# Patient Record
Sex: Female | Born: 1935 | Race: Black or African American | Hispanic: No | Smoking: Former smoker
Health system: Southern US, Community
[De-identification: ages and names within clinical notes are randomized; demographics above are authoritative.]

## PROBLEM LIST (undated history)

## (undated) DIAGNOSIS — E2839 Other primary ovarian failure: Secondary | ICD-10-CM

## (undated) DIAGNOSIS — E785 Hyperlipidemia, unspecified: Secondary | ICD-10-CM

## (undated) DIAGNOSIS — B029 Zoster without complications: Secondary | ICD-10-CM

## (undated) DIAGNOSIS — I679 Cerebrovascular disease, unspecified: Secondary | ICD-10-CM

## (undated) DIAGNOSIS — K635 Polyp of colon: Secondary | ICD-10-CM

## (undated) DIAGNOSIS — M170 Bilateral primary osteoarthritis of knee: Secondary | ICD-10-CM

## (undated) DIAGNOSIS — M8580 Other specified disorders of bone density and structure, unspecified site: Secondary | ICD-10-CM

## (undated) DIAGNOSIS — M1990 Unspecified osteoarthritis, unspecified site: Secondary | ICD-10-CM

## (undated) DIAGNOSIS — M545 Low back pain, unspecified: Secondary | ICD-10-CM

## (undated) DIAGNOSIS — N3281 Overactive bladder: Secondary | ICD-10-CM

## (undated) DIAGNOSIS — E041 Nontoxic single thyroid nodule: Secondary | ICD-10-CM

## (undated) DIAGNOSIS — E7800 Pure hypercholesterolemia, unspecified: Secondary | ICD-10-CM

## (undated) DIAGNOSIS — G4710 Hypersomnia, unspecified: Secondary | ICD-10-CM

## (undated) DIAGNOSIS — R32 Unspecified urinary incontinence: Secondary | ICD-10-CM

## (undated) DIAGNOSIS — Z6841 Body Mass Index (BMI) 40.0 and over, adult: Secondary | ICD-10-CM

## (undated) DIAGNOSIS — I10 Essential (primary) hypertension: Secondary | ICD-10-CM

## (undated) DIAGNOSIS — I499 Cardiac arrhythmia, unspecified: Secondary | ICD-10-CM

## (undated) DIAGNOSIS — G5623 Lesion of ulnar nerve, bilateral upper limbs: Secondary | ICD-10-CM

## (undated) DIAGNOSIS — M899 Disorder of bone, unspecified: Secondary | ICD-10-CM

## (undated) DIAGNOSIS — M5440 Lumbago with sciatica, unspecified side: Secondary | ICD-10-CM

## (undated) DIAGNOSIS — J449 Chronic obstructive pulmonary disease, unspecified: Secondary | ICD-10-CM

## (undated) DIAGNOSIS — I5032 Chronic diastolic (congestive) heart failure: Secondary | ICD-10-CM

## (undated) DIAGNOSIS — I639 Cerebral infarction, unspecified: Secondary | ICD-10-CM

## (undated) DIAGNOSIS — J309 Allergic rhinitis, unspecified: Secondary | ICD-10-CM

## (undated) DIAGNOSIS — R42 Dizziness and giddiness: Secondary | ICD-10-CM

## (undated) HISTORY — DX: Lesion of ulnar nerve, bilateral upper limbs: G56.23

## (undated) HISTORY — DX: Chronic obstructive pulmonary disease, unspecified: J44.9

## (undated) HISTORY — DX: Hypersomnia, unspecified: G47.10

## (undated) HISTORY — DX: Unspecified urinary incontinence: R32

## (undated) HISTORY — DX: Other specified disorders of bone density and structure, unspecified site: M85.80

## (undated) HISTORY — DX: Zoster without complications: B02.9

## (undated) HISTORY — DX: Cerebrovascular disease, unspecified: I67.9

## (undated) HISTORY — DX: Unspecified osteoarthritis, unspecified site: M19.90

## (undated) HISTORY — DX: Morbid (severe) obesity due to excess calories: E66.01

## (undated) HISTORY — DX: Dizziness and giddiness: R42

## (undated) HISTORY — DX: Chronic diastolic (congestive) heart failure: I50.32

## (undated) HISTORY — DX: Cardiac arrhythmia, unspecified: I49.9

## (undated) HISTORY — DX: Cerebral infarction, unspecified: I63.9

## (undated) HISTORY — DX: Body Mass Index (BMI) 40.0 and over, adult: Z684

## (undated) HISTORY — DX: Hyperlipidemia, unspecified: E78.5

## (undated) HISTORY — DX: Allergic rhinitis, unspecified: J30.9

## (undated) HISTORY — DX: Disorder of bone, unspecified: M89.9

## (undated) HISTORY — PX: BREAST EXCISIONAL BIOPSY: SUR124

## (undated) HISTORY — DX: Nontoxic single thyroid nodule: E04.1

## (undated) HISTORY — DX: Other primary ovarian failure: E28.39

## (undated) HISTORY — DX: Low back pain, unspecified: M54.50

## (undated) HISTORY — DX: Polyp of colon: K63.5

## (undated) HISTORY — DX: Overactive bladder: N32.81

## (undated) HISTORY — DX: Pure hypercholesterolemia, unspecified: E78.00

## (undated) HISTORY — DX: Lumbago with sciatica, unspecified side: M54.40

## (undated) HISTORY — DX: Essential (primary) hypertension: I10

## (undated) HISTORY — DX: Bilateral primary osteoarthritis of knee: M17.0

---

## 1999-07-31 ENCOUNTER — Ambulatory Visit (HOSPITAL_COMMUNITY): Admission: RE | Admit: 1999-07-31 | Discharge: 1999-07-31 | Payer: Self-pay | Admitting: Gastroenterology

## 1999-07-31 ENCOUNTER — Encounter (INDEPENDENT_AMBULATORY_CARE_PROVIDER_SITE_OTHER): Payer: Self-pay | Admitting: *Deleted

## 2000-01-03 ENCOUNTER — Encounter: Payer: Self-pay | Admitting: *Deleted

## 2000-01-03 ENCOUNTER — Encounter: Admission: RE | Admit: 2000-01-03 | Discharge: 2000-01-03 | Payer: Self-pay | Admitting: *Deleted

## 2001-01-13 ENCOUNTER — Encounter: Payer: Self-pay | Admitting: Family Medicine

## 2001-01-13 ENCOUNTER — Encounter: Admission: RE | Admit: 2001-01-13 | Discharge: 2001-01-13 | Payer: Self-pay | Admitting: Family Medicine

## 2001-04-29 ENCOUNTER — Ambulatory Visit (HOSPITAL_COMMUNITY): Admission: RE | Admit: 2001-04-29 | Discharge: 2001-04-29 | Payer: Self-pay | Admitting: *Deleted

## 2001-04-29 ENCOUNTER — Encounter: Payer: Self-pay | Admitting: *Deleted

## 2001-05-07 ENCOUNTER — Encounter: Payer: Self-pay | Admitting: *Deleted

## 2001-05-07 ENCOUNTER — Encounter (INDEPENDENT_AMBULATORY_CARE_PROVIDER_SITE_OTHER): Payer: Self-pay | Admitting: Specialist

## 2001-05-07 ENCOUNTER — Ambulatory Visit (HOSPITAL_COMMUNITY): Admission: RE | Admit: 2001-05-07 | Discharge: 2001-05-07 | Payer: Self-pay | Admitting: *Deleted

## 2001-09-22 ENCOUNTER — Ambulatory Visit: Admission: RE | Admit: 2001-09-22 | Discharge: 2001-09-22 | Payer: Self-pay | Admitting: *Deleted

## 2001-10-14 ENCOUNTER — Ambulatory Visit (HOSPITAL_COMMUNITY): Admission: RE | Admit: 2001-10-14 | Discharge: 2001-10-14 | Payer: Self-pay | Admitting: *Deleted

## 2001-10-14 ENCOUNTER — Encounter: Payer: Self-pay | Admitting: *Deleted

## 2001-12-10 ENCOUNTER — Encounter: Payer: Self-pay | Admitting: *Deleted

## 2001-12-10 ENCOUNTER — Encounter: Admission: RE | Admit: 2001-12-10 | Discharge: 2001-12-10 | Payer: Self-pay | Admitting: *Deleted

## 2002-04-15 ENCOUNTER — Encounter: Payer: Self-pay | Admitting: *Deleted

## 2002-04-15 ENCOUNTER — Encounter: Admission: RE | Admit: 2002-04-15 | Discharge: 2002-04-15 | Payer: Self-pay | Admitting: *Deleted

## 2002-05-29 ENCOUNTER — Encounter: Payer: Self-pay | Admitting: *Deleted

## 2002-05-29 ENCOUNTER — Ambulatory Visit (HOSPITAL_COMMUNITY): Admission: RE | Admit: 2002-05-29 | Discharge: 2002-05-29 | Payer: Self-pay | Admitting: *Deleted

## 2003-04-16 ENCOUNTER — Other Ambulatory Visit: Admission: RE | Admit: 2003-04-16 | Discharge: 2003-04-16 | Payer: Self-pay | Admitting: *Deleted

## 2003-04-21 ENCOUNTER — Encounter: Payer: Self-pay | Admitting: *Deleted

## 2003-04-21 ENCOUNTER — Encounter: Admission: RE | Admit: 2003-04-21 | Discharge: 2003-04-21 | Payer: Self-pay | Admitting: *Deleted

## 2003-10-13 ENCOUNTER — Ambulatory Visit (HOSPITAL_COMMUNITY): Admission: RE | Admit: 2003-10-13 | Discharge: 2003-10-13 | Payer: Self-pay | Admitting: Family Medicine

## 2003-10-13 IMAGING — CR DG HIP (WITH OR WITHOUT PELVIS) 2-3V*L*
3 series · 3 of 3 positions shown · non-contrast
Comparison: None.

CLINICAL DATA: Surgery sixty years ago.  Fell several months.  Now with left hip pain when bending over.  

 LEFT HIP COMPLETE, [DATE]

[view not recorded (1 of 3)]
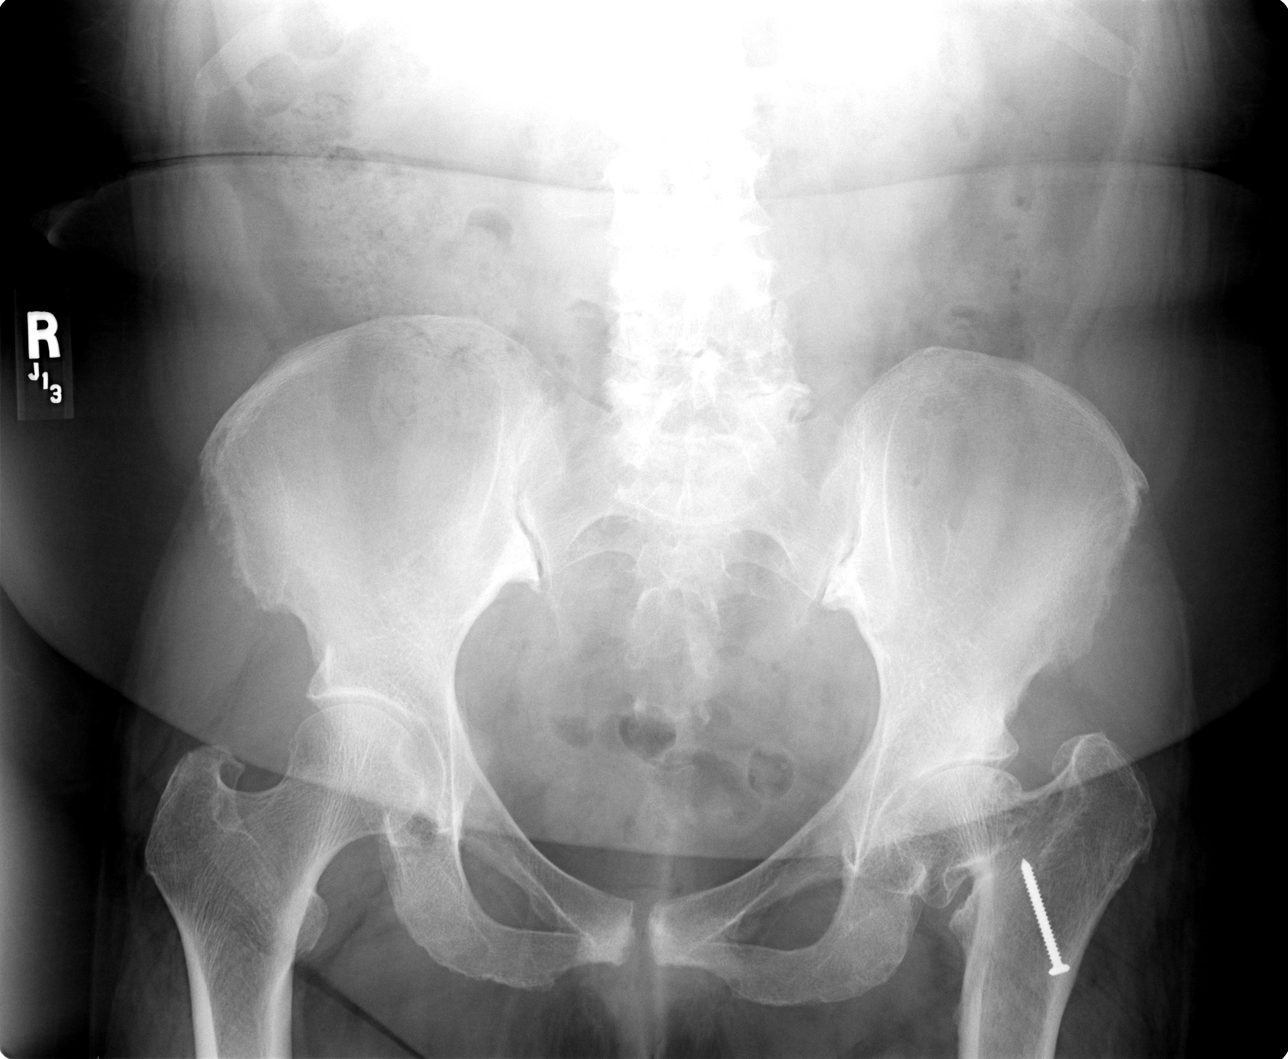

[view not recorded (2 of 3)]
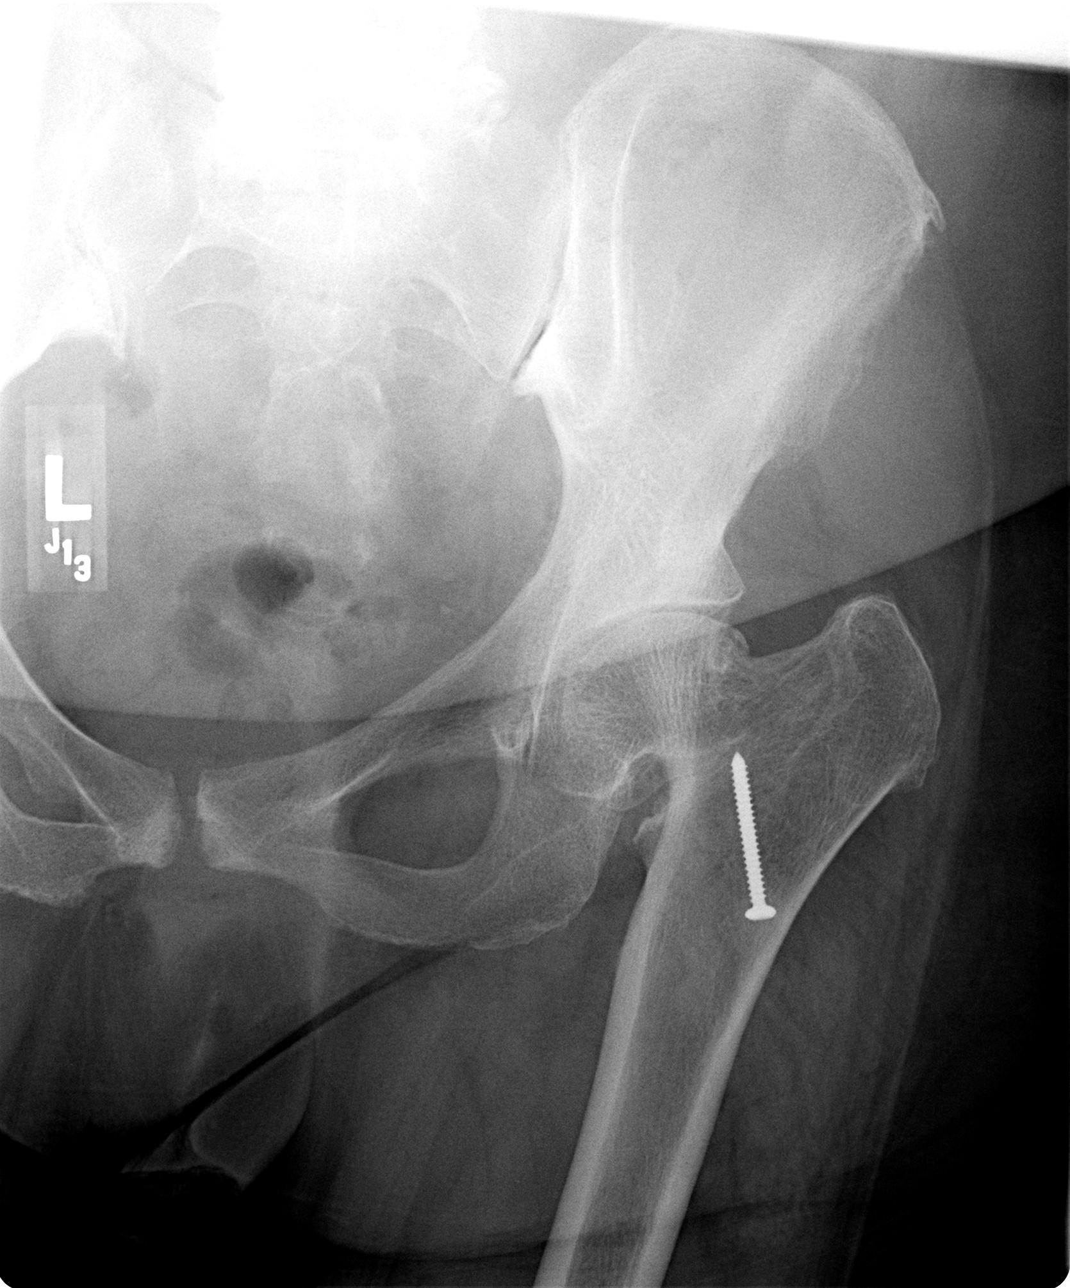

[view not recorded (3 of 3)]
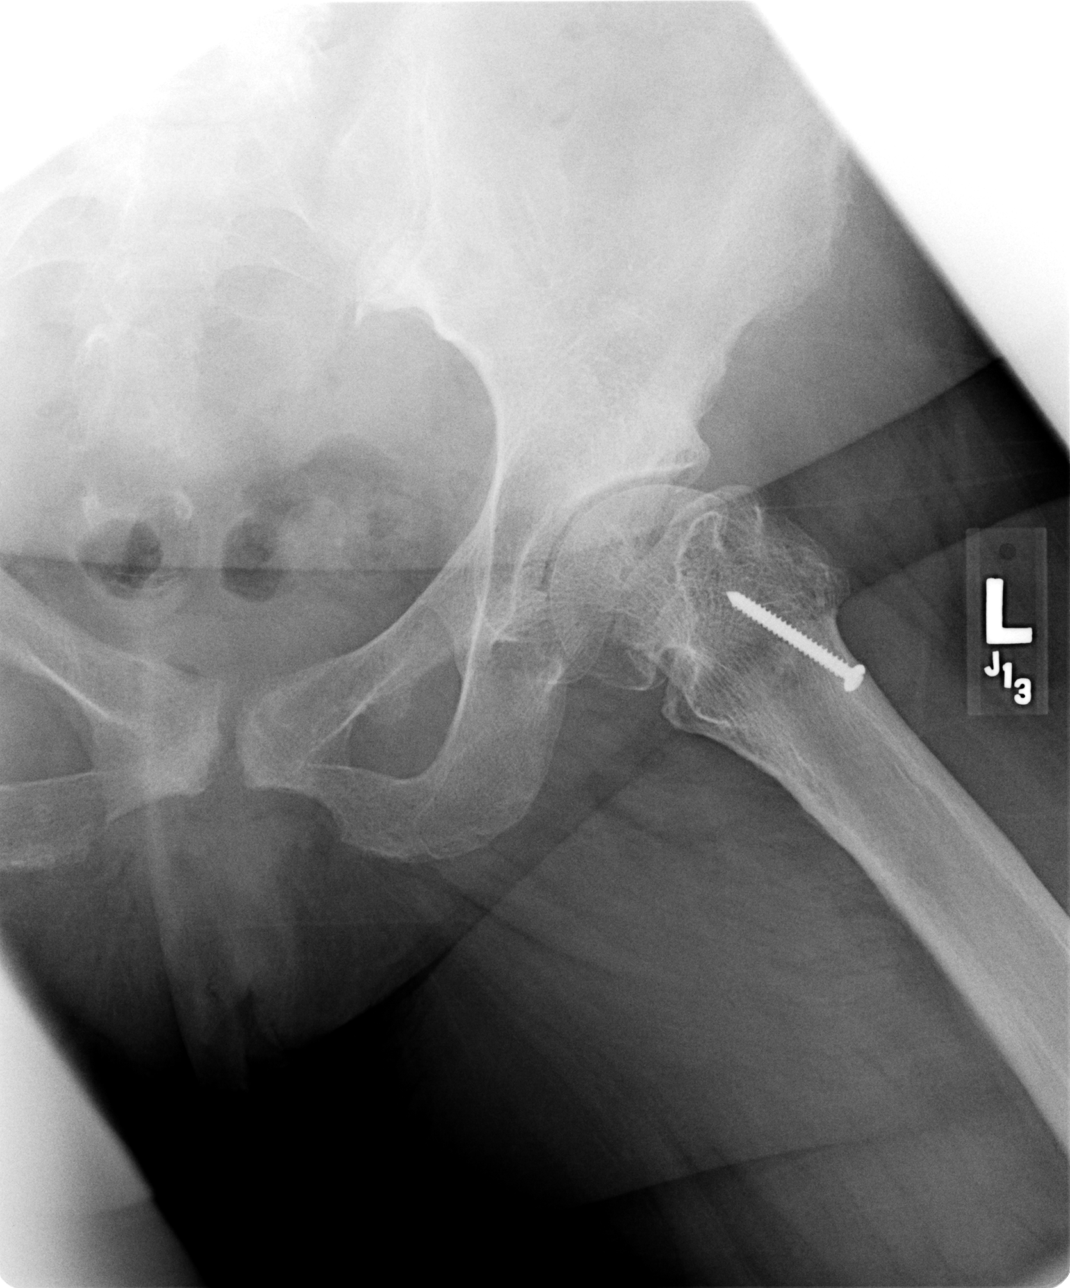

[3 of 3 positions shown; findings below may reference images not displayed]

FINDINGS: AP pelvis obtained with frontal and frogleg lateral views of the left hip.  Single surgical screw is seen in the left femoral neck.  There is marked shortening of the left femoral neck consistent with previous trauma although no acute fracture is identified.  There is relatively mild loss of joint space in the left hip compared to the right without evidence for substantial hypertrophic spurring.  Degenerative changes are seen in the lower lumbar spine and lumbosacral junction. 

 IMPRESSION
 Presumed post traumatic deformity in the left hip with marked foreshortening in the left femoral neck.  There is no acute bony abnormality identified on the current study. 

 Mild loss of joint space in the left acetabulum. 

 [REDACTED]

## 2004-05-01 ENCOUNTER — Encounter: Admission: RE | Admit: 2004-05-01 | Discharge: 2004-05-01 | Payer: Self-pay | Admitting: Obstetrics and Gynecology

## 2004-07-21 ENCOUNTER — Ambulatory Visit (HOSPITAL_COMMUNITY): Admission: RE | Admit: 2004-07-21 | Discharge: 2004-07-21 | Payer: Self-pay | Admitting: Gastroenterology

## 2005-05-02 ENCOUNTER — Encounter: Admission: RE | Admit: 2005-05-02 | Discharge: 2005-05-02 | Payer: Self-pay | Admitting: General Surgery

## 2005-12-05 ENCOUNTER — Inpatient Hospital Stay (HOSPITAL_COMMUNITY): Admission: EM | Admit: 2005-12-05 | Discharge: 2005-12-12 | Payer: Self-pay | Admitting: Emergency Medicine

## 2005-12-05 ENCOUNTER — Ambulatory Visit: Payer: Self-pay | Admitting: Internal Medicine

## 2005-12-05 IMAGING — CR DG CHEST 1V PORT
1 series · 1 of 1 positions shown · non-contrast
Comparison: None.

CLINICAL DATA: 72-year-old, with shortness of breath. 
 PORTABLE CHEST:

[view not recorded]
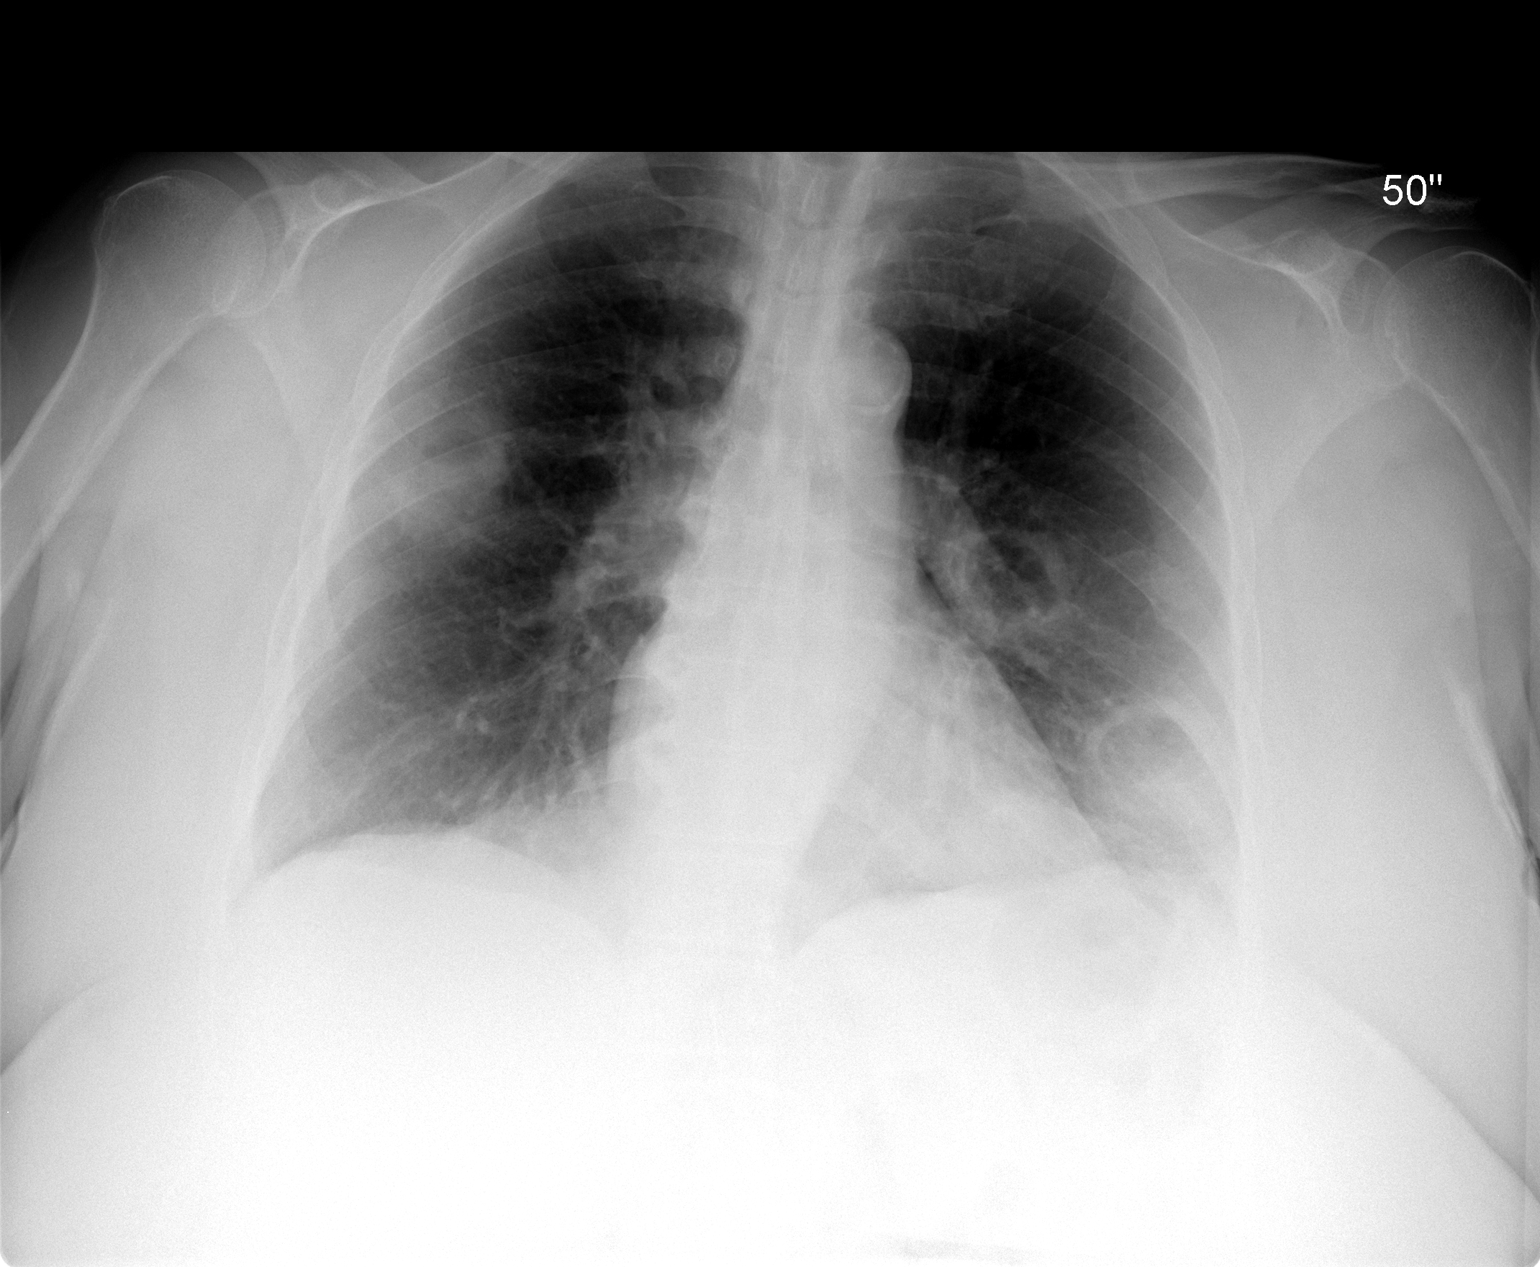

[1 of 1 positions shown; findings below may reference images not displayed]

FINDINGS: Cardiac silhouette, mediastinal, and hilar contours are within normal limits.  There are multiple cavitary lung lesions.  These may be necrotic metastases or lung abscesses.  Septic emboli would be a possibility.  Chest CT may be helpful for further evaluation.  No edema or effusions.
IMPRESSION: At least 3 large cavitary lesions in the lungs.  Cavitary/necrotic metastases, lung abscesses, or septic emboli.

## 2005-12-05 IMAGING — CT CT CHEST W/ CM
2 of 3 series · 15 of 36 positions shown, 18 images · IV contrast (omnipaque)
Comparison: None available.

CLINICAL DATA: Shortness of breath.  
 CHEST CT WITH CONTRAST:
TECHNIQUE: Multidetector CT imaging of the chest was performed following the standard protocol during bolus administration of intravenous contrast.
 Contrast:  80 cc Omnipaque 300.

[Series 2: chest_routine 5.0 b40f st · axial · 0.65mm/px · z∈[-153,+102]mm · 12 of 61 slices shown, 15 images]
[im 5/61  mediastinal]
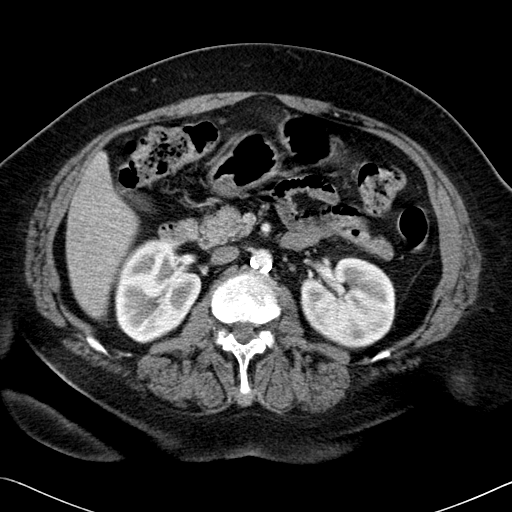
[im 5/61  lung]
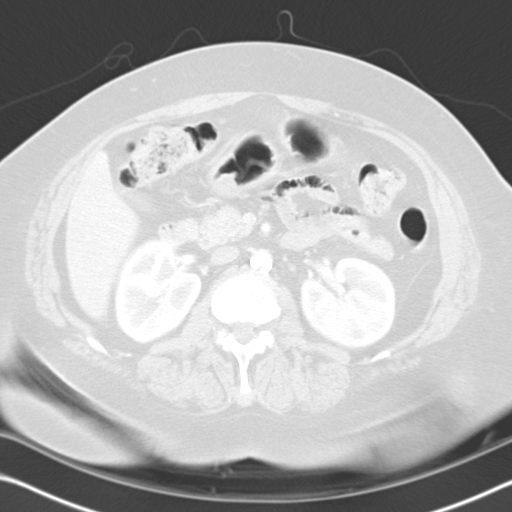
[im 9/61  lung]
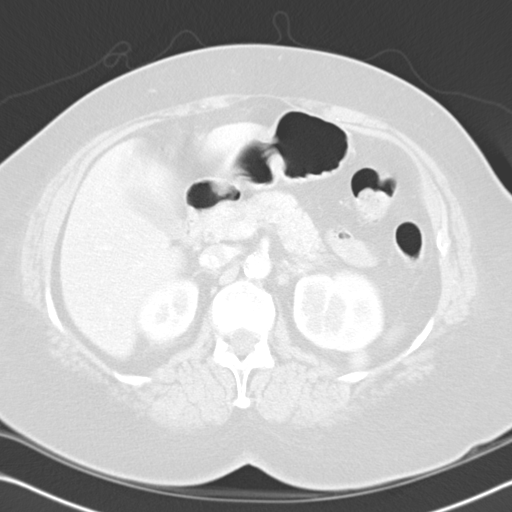
[im 14/61  lung]
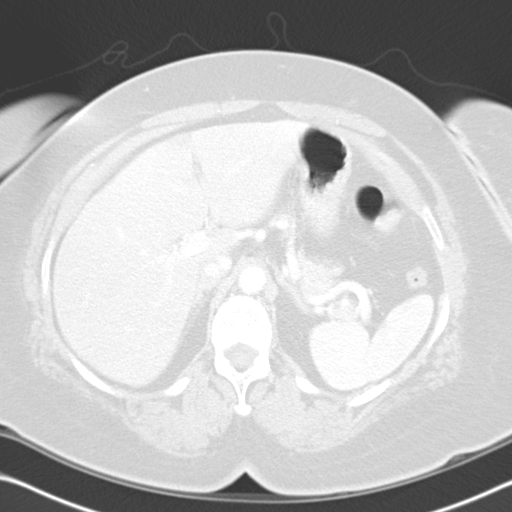
[im 18/61  lung]
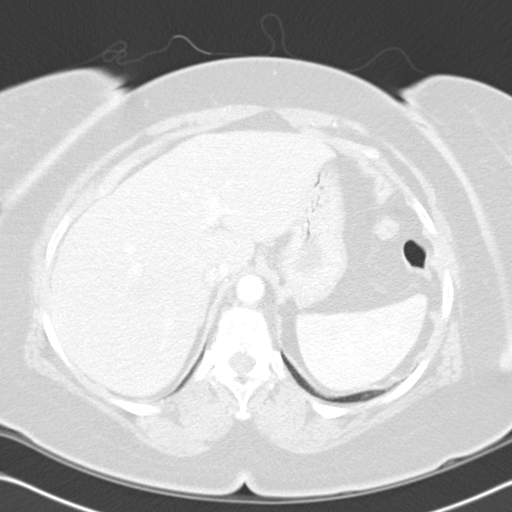
[im 23/61  mediastinal]
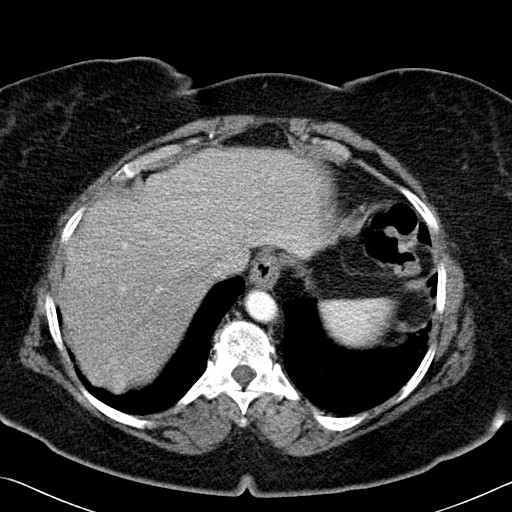
[im 23/61  lung]
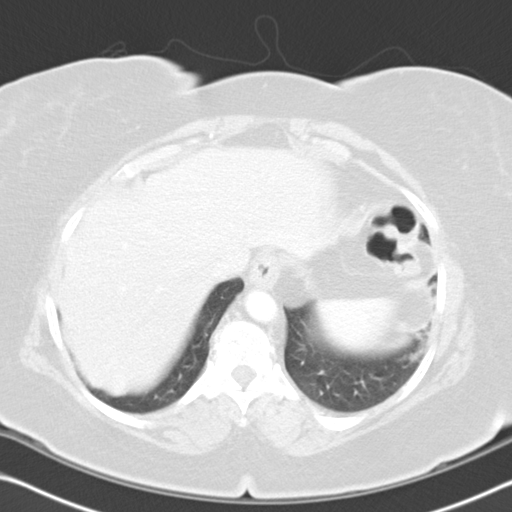
[im 27/61  lung]
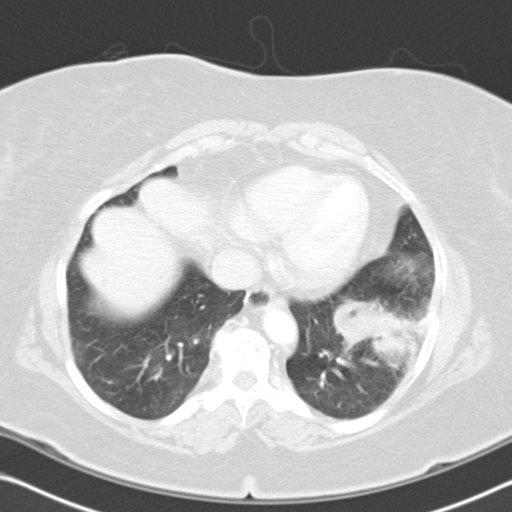
[im 34/61  lung]
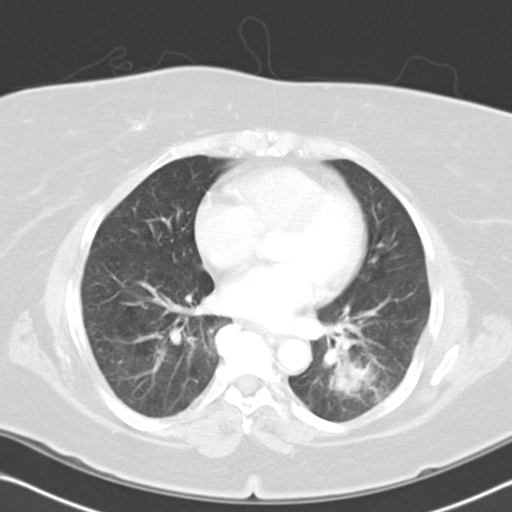
[im 38/61  lung]
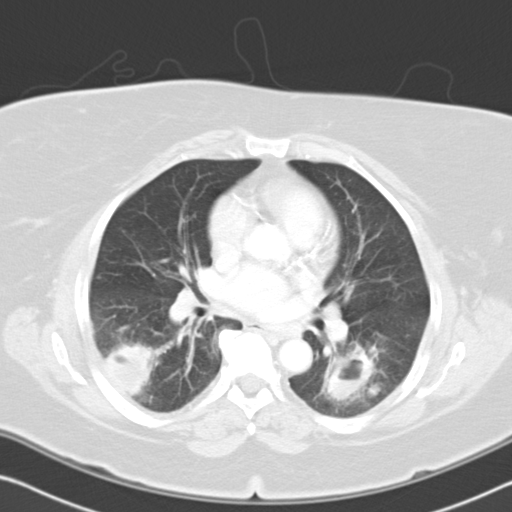
[im 43/61  mediastinal]
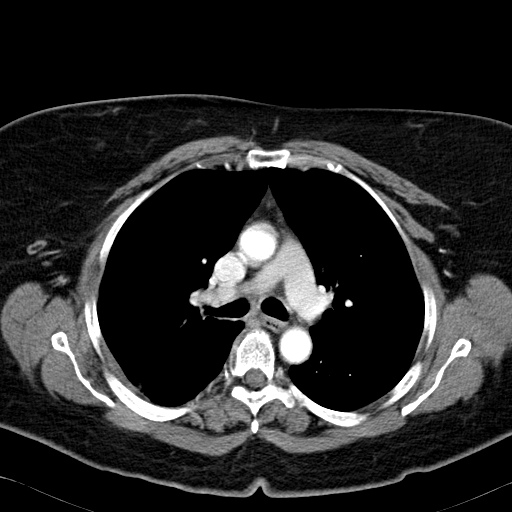
[im 43/61  lung]
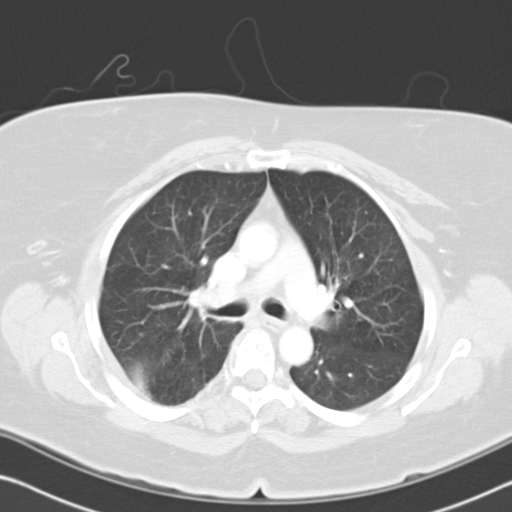
[im 47/61  lung]
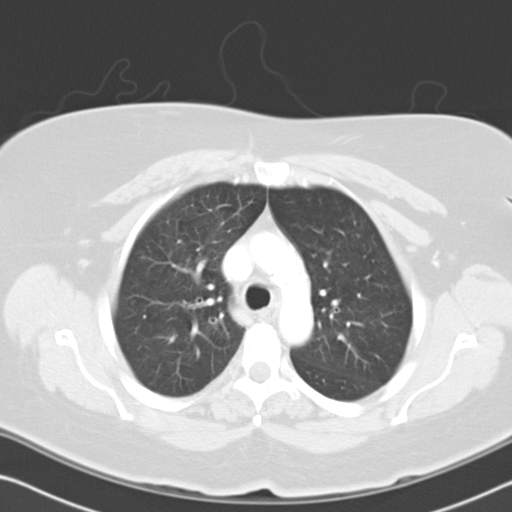
[im 52/61  lung]
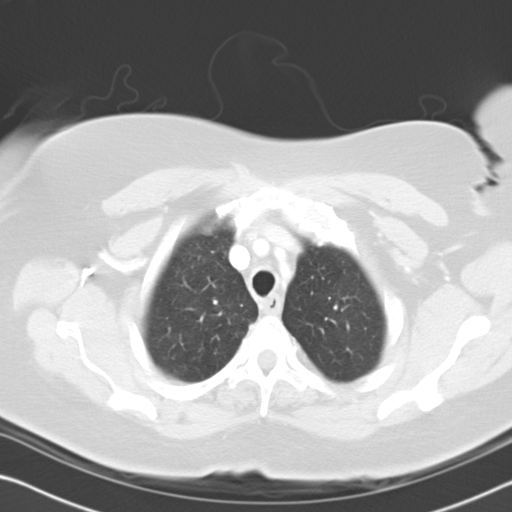
[im 56/61  lung]
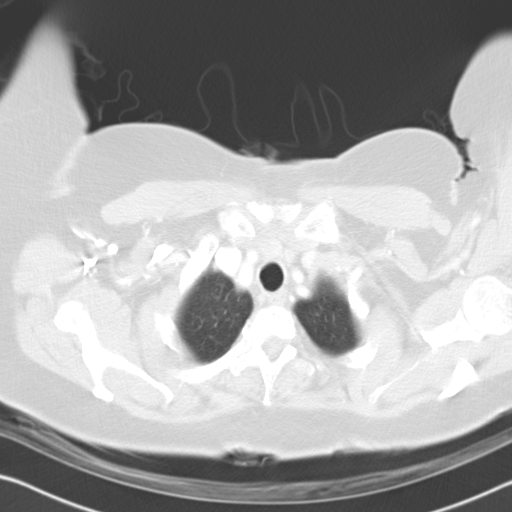

[Series 602: cor · coronal · 0.65mm/px · 3 of 50 slices shown]
[im 10/50  lung]
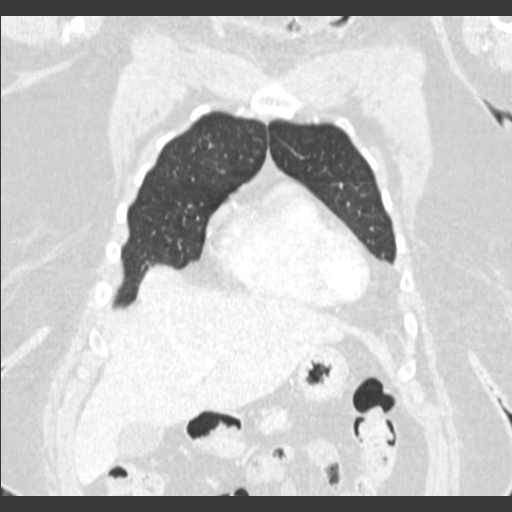
[im 20/50  lung]
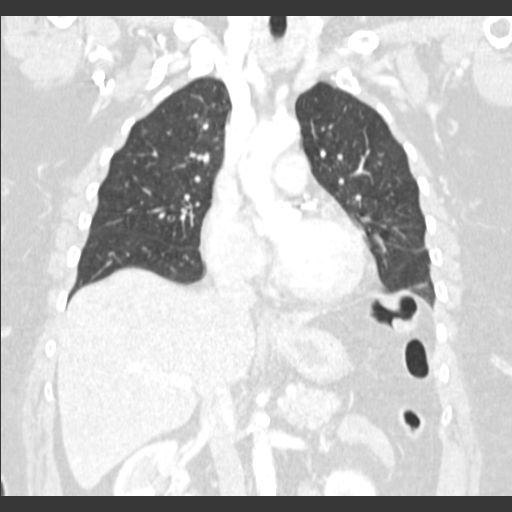
[im 30/50  lung]
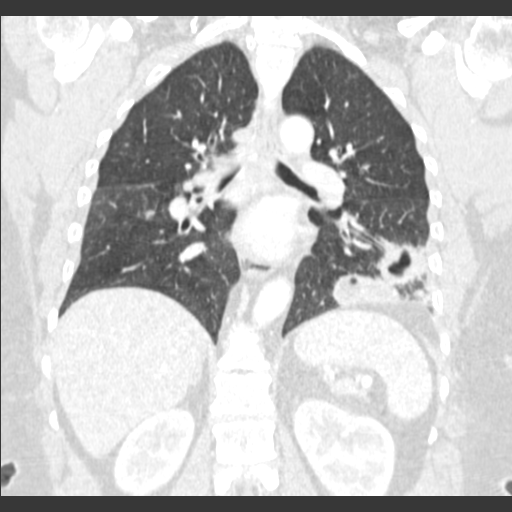

[15 of 36 positions shown; findings below may reference images not displayed]

FINDINGS: No pathologically enlarged axillary lymph nodes are identified.  There is a precarinal lymph node, which is enlarged measuring 1.4 X 1.1 cm (image 17). Left hilar lymph node measures 10.5 X 7.3 mm (image 24).  No pre-vascular lymph nodes or pathologic lymph nodes are noted.  There is no AP pathologic lymphadenopathy.  
 No pleural or pericardial effusion noted. 
 Multiple nonspecific hypodensities are seen within the liver, for example left lobe (image 40) subcentimeter hypodensity is too small to characterize within the right lobe (image 38) there is a tiny hypodensity measuring 5.3 mm also too small to characterize. 
 Spleen negative. 
 Visualized portions of the pancreas are negative. Visualized portions of the adrenal glands are negative. 
 There are bilateral pulmonary nodules with significant cavitary component or bilateral masses identified with significant cavitary component.  For reference purposes left lower lobe cavitary mass with air fluid level measures 3.4 X 3.3 cm (image 31).  Within the right lower lobe there is a 3.3 X 3.1 cm cavitary mass (image 23).
 Review of the bone windows is unremarkable.
IMPRESSION: 1.  Large bilateral cavitary masses with air fluid levels.  The differential diagnosis includes septic emboli, necrotic tumor such as squamous cell carcinoma, granulomatous infection or inflammation.  
 2.  Borderline enlarged precarinal lymph node is nonspecific and may be infectious or malignant in etiology.

## 2005-12-06 ENCOUNTER — Encounter (INDEPENDENT_AMBULATORY_CARE_PROVIDER_SITE_OTHER): Payer: Self-pay | Admitting: *Deleted

## 2005-12-07 ENCOUNTER — Encounter (INDEPENDENT_AMBULATORY_CARE_PROVIDER_SITE_OTHER): Payer: Self-pay | Admitting: *Deleted

## 2005-12-10 ENCOUNTER — Encounter (INDEPENDENT_AMBULATORY_CARE_PROVIDER_SITE_OTHER): Payer: Self-pay | Admitting: Interventional Cardiology

## 2005-12-11 IMAGING — CR DG CHEST 2V
2 series · 2 of 2 positions shown · non-contrast
Comparison: none

CLINICAL DATA: Follow up hemoptysis.  Lung abscesses.
CHEST ? 2 VIEW:

[w chest pa]
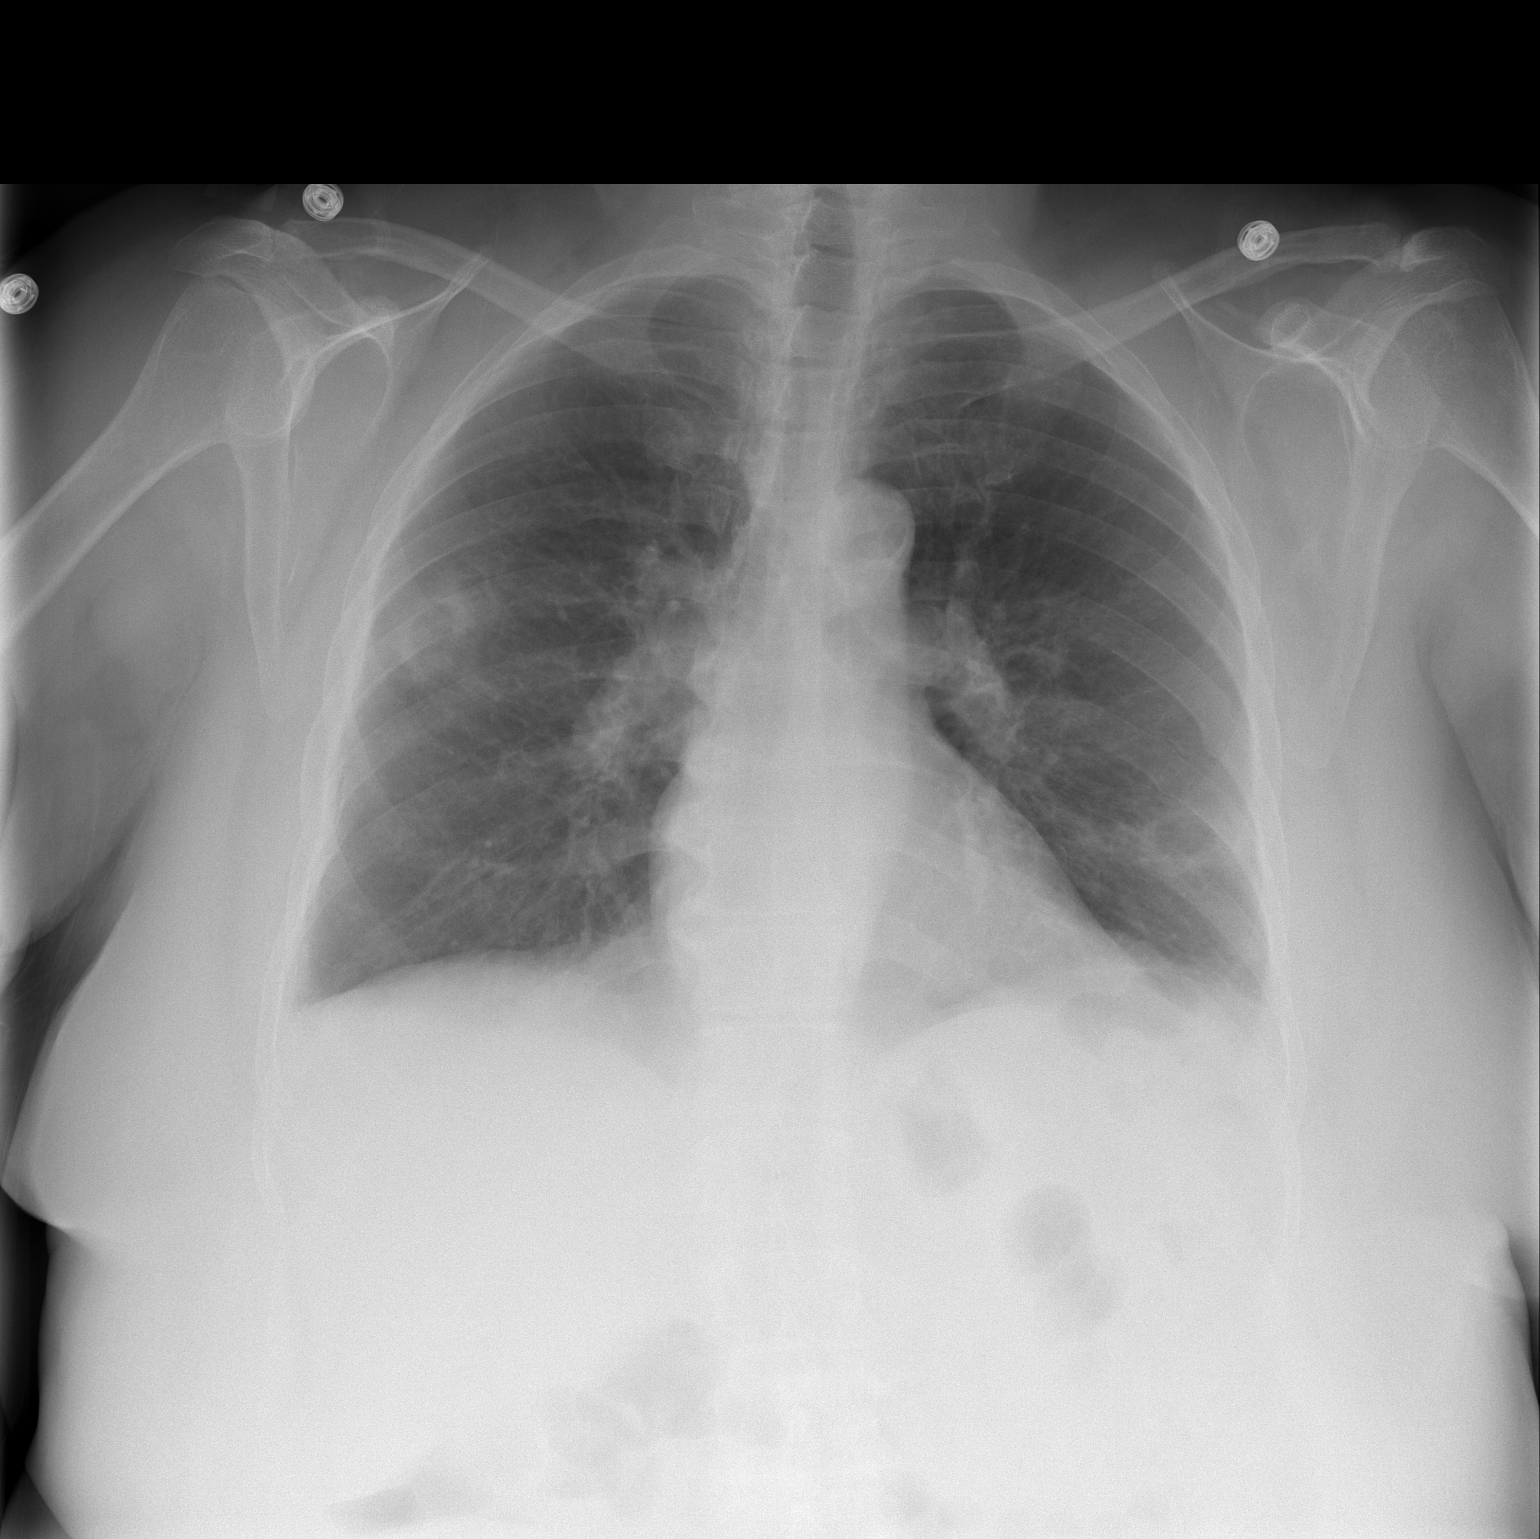

[w chest lat]
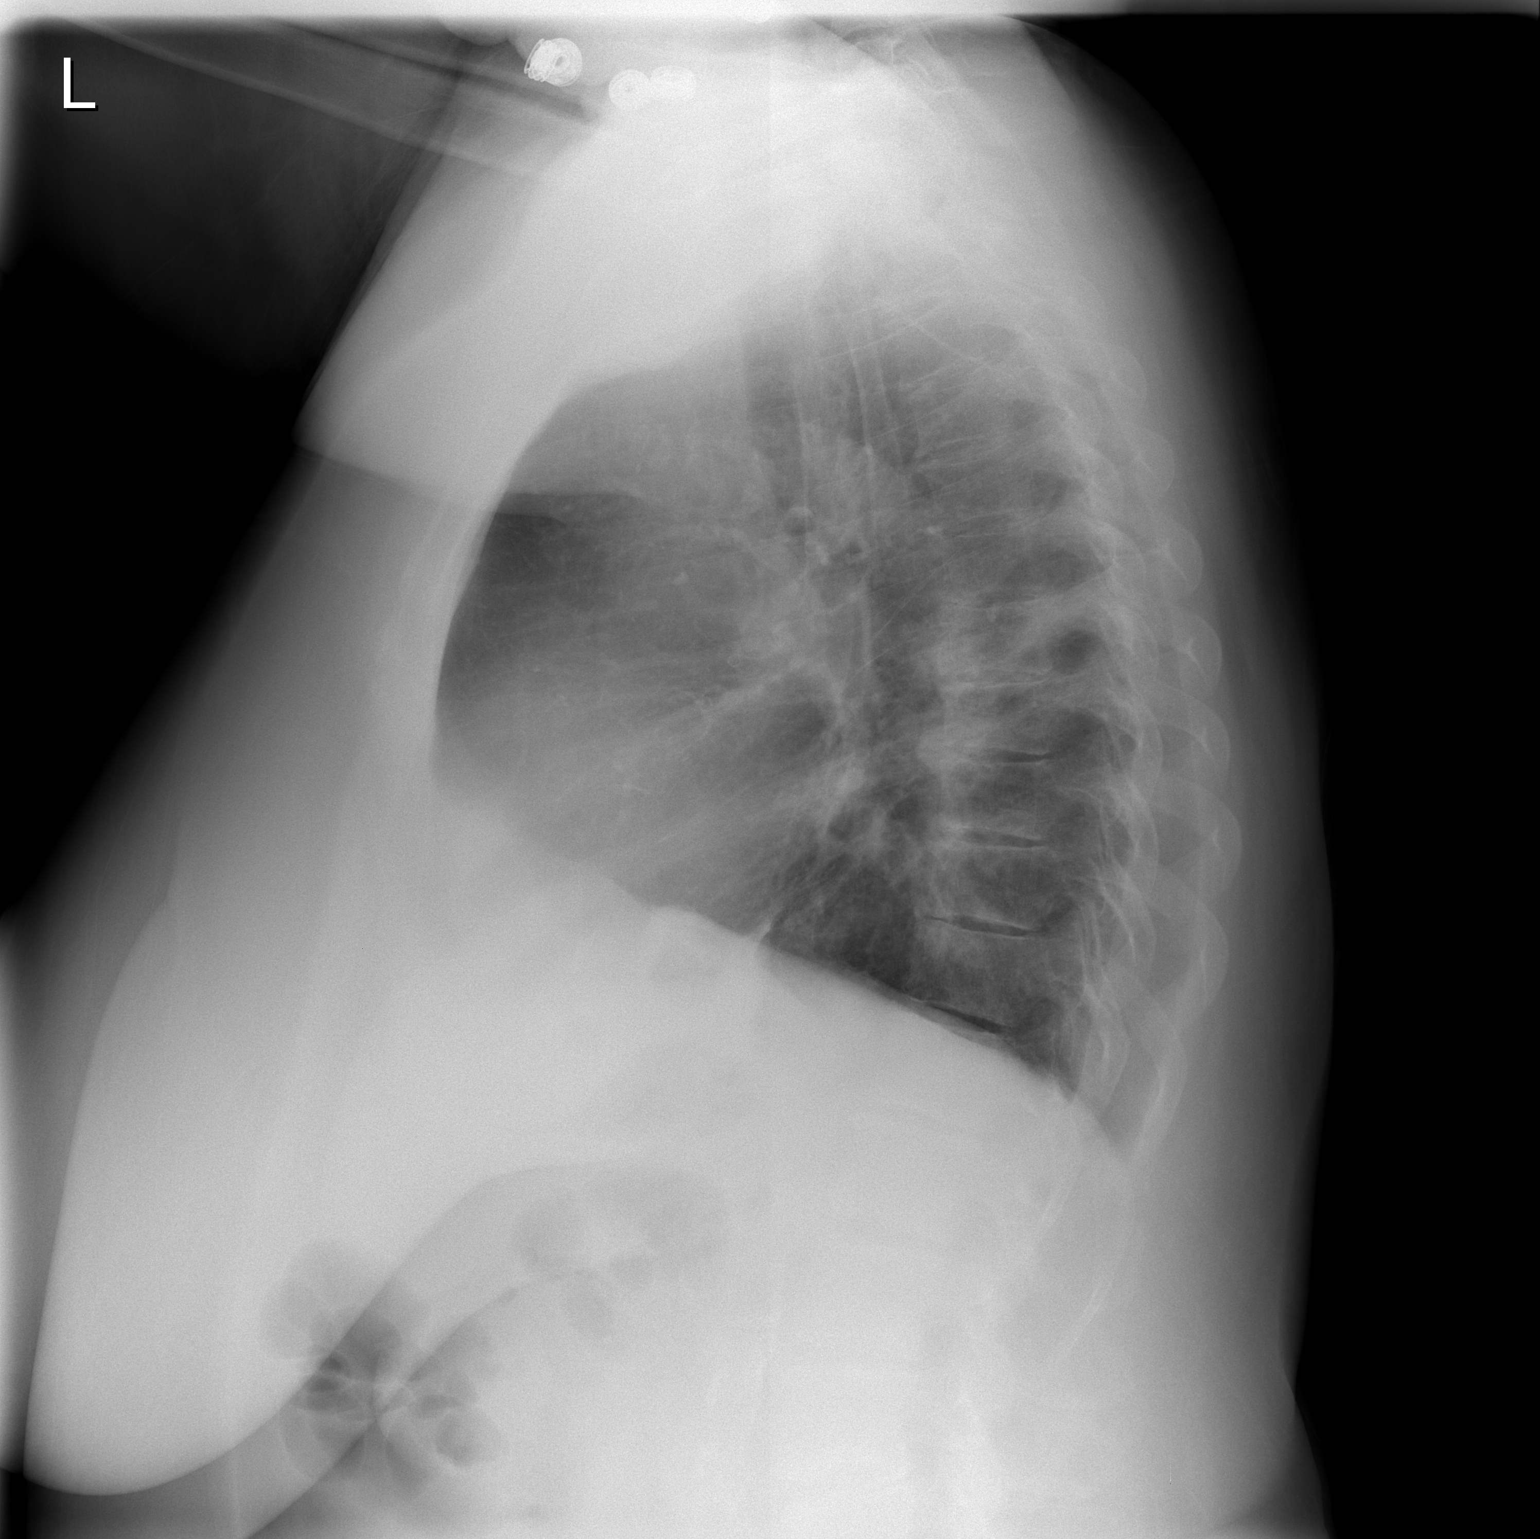

[2 of 2 positions shown; findings below may reference images not displayed]

FINDINGS: PA and lateral views of the chest are made and are compared to the previous portable chest of [DATE] at [U1] hours and the previous CT scan of [DATE] and now show the cavitary lesion in the region of the right and left lungs to have decreased significantly in size and prominence.  There remains some atelectasis at the left base.  The lungs show diffuse peribronchial thickening.  The aorta is minimally elongated and calcified.  The heart is normal.  The bony thorax shows anterior degenerative hypertrophic spurs of the thoracic spine and a mild generalized osteopenia.
IMPRESSION: Significant interval reduction in size of the cavitary lesions associated with both lungs.  There remains a diffuse peribronchial thickening and some mild atelectasis at the left base.

## 2005-12-17 ENCOUNTER — Ambulatory Visit (HOSPITAL_COMMUNITY): Admission: RE | Admit: 2005-12-17 | Discharge: 2005-12-17 | Payer: Self-pay | Admitting: Family Medicine

## 2005-12-17 IMAGING — CR DG CHEST 2V
2 series · 2 of 2 positions shown · non-contrast
Comparison: [DATE].

CLINICAL DATA: Follow-up cavitary lung lesions.
 CHEST ? 2 VIEW:

[view not recorded (1 of 2)]
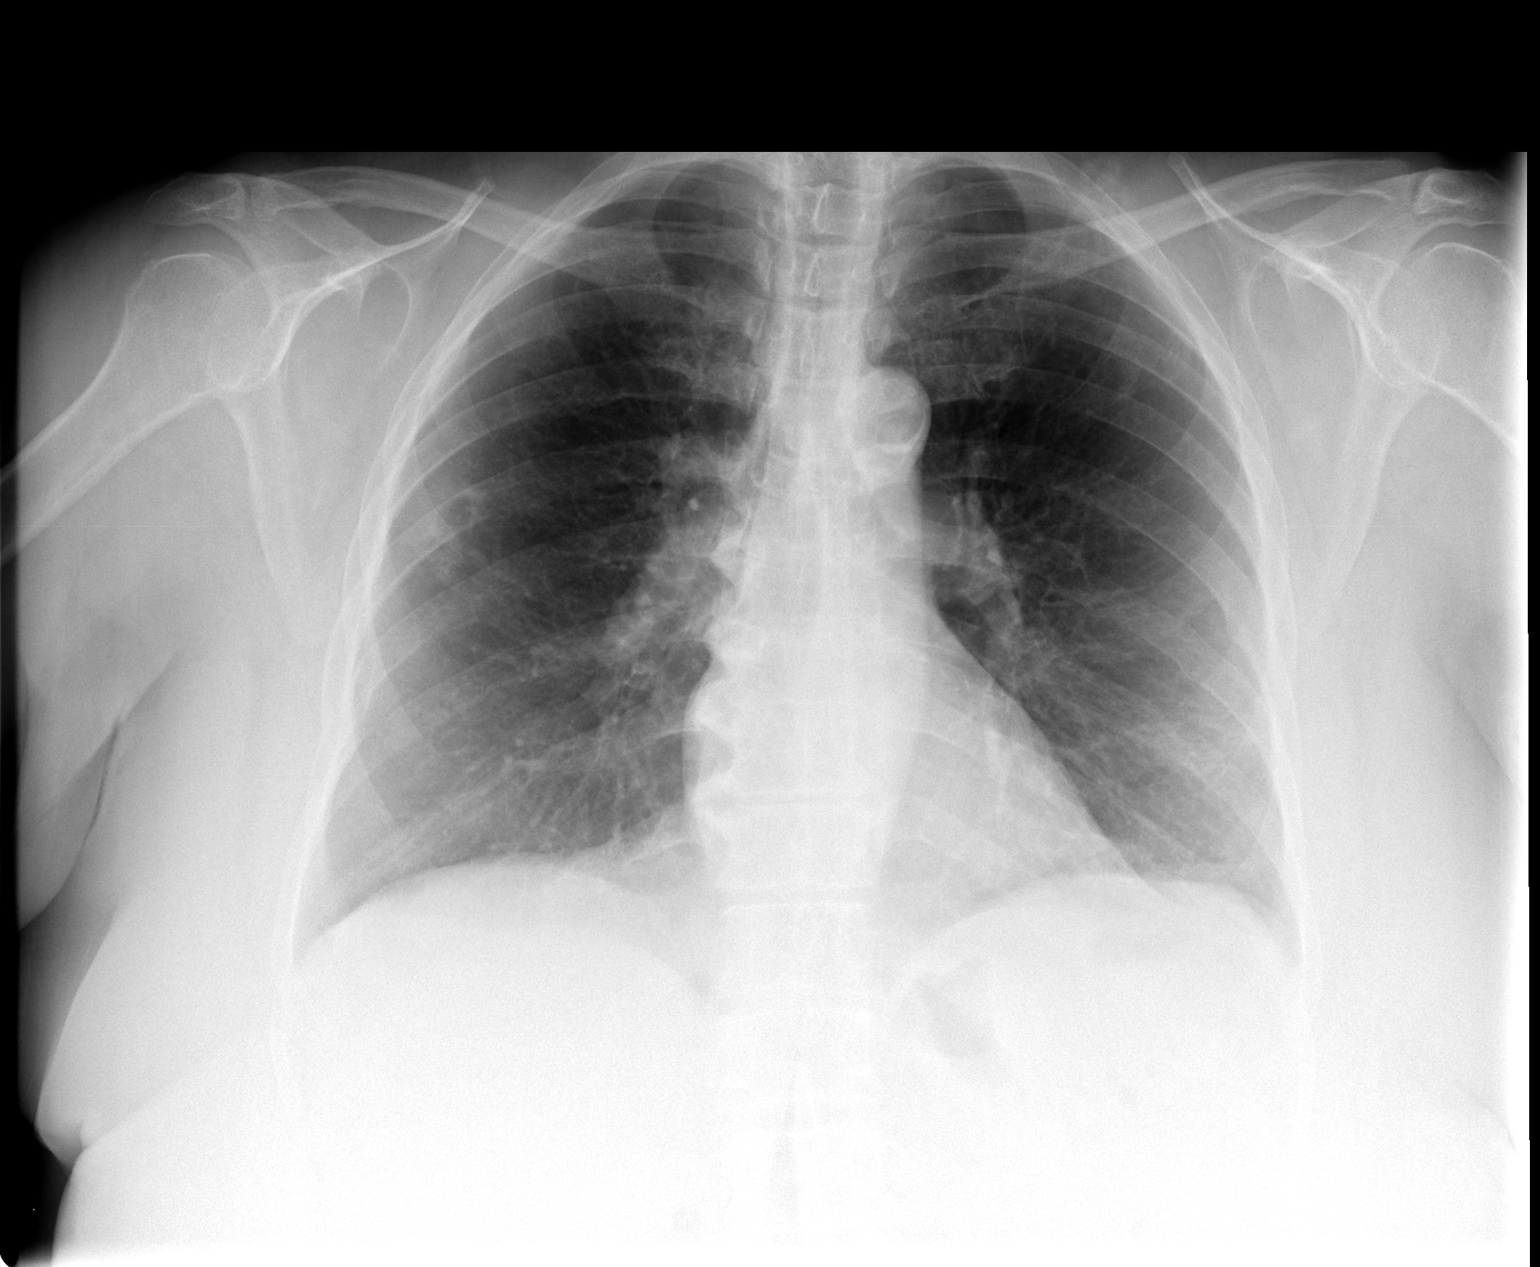

[view not recorded (2 of 2)]
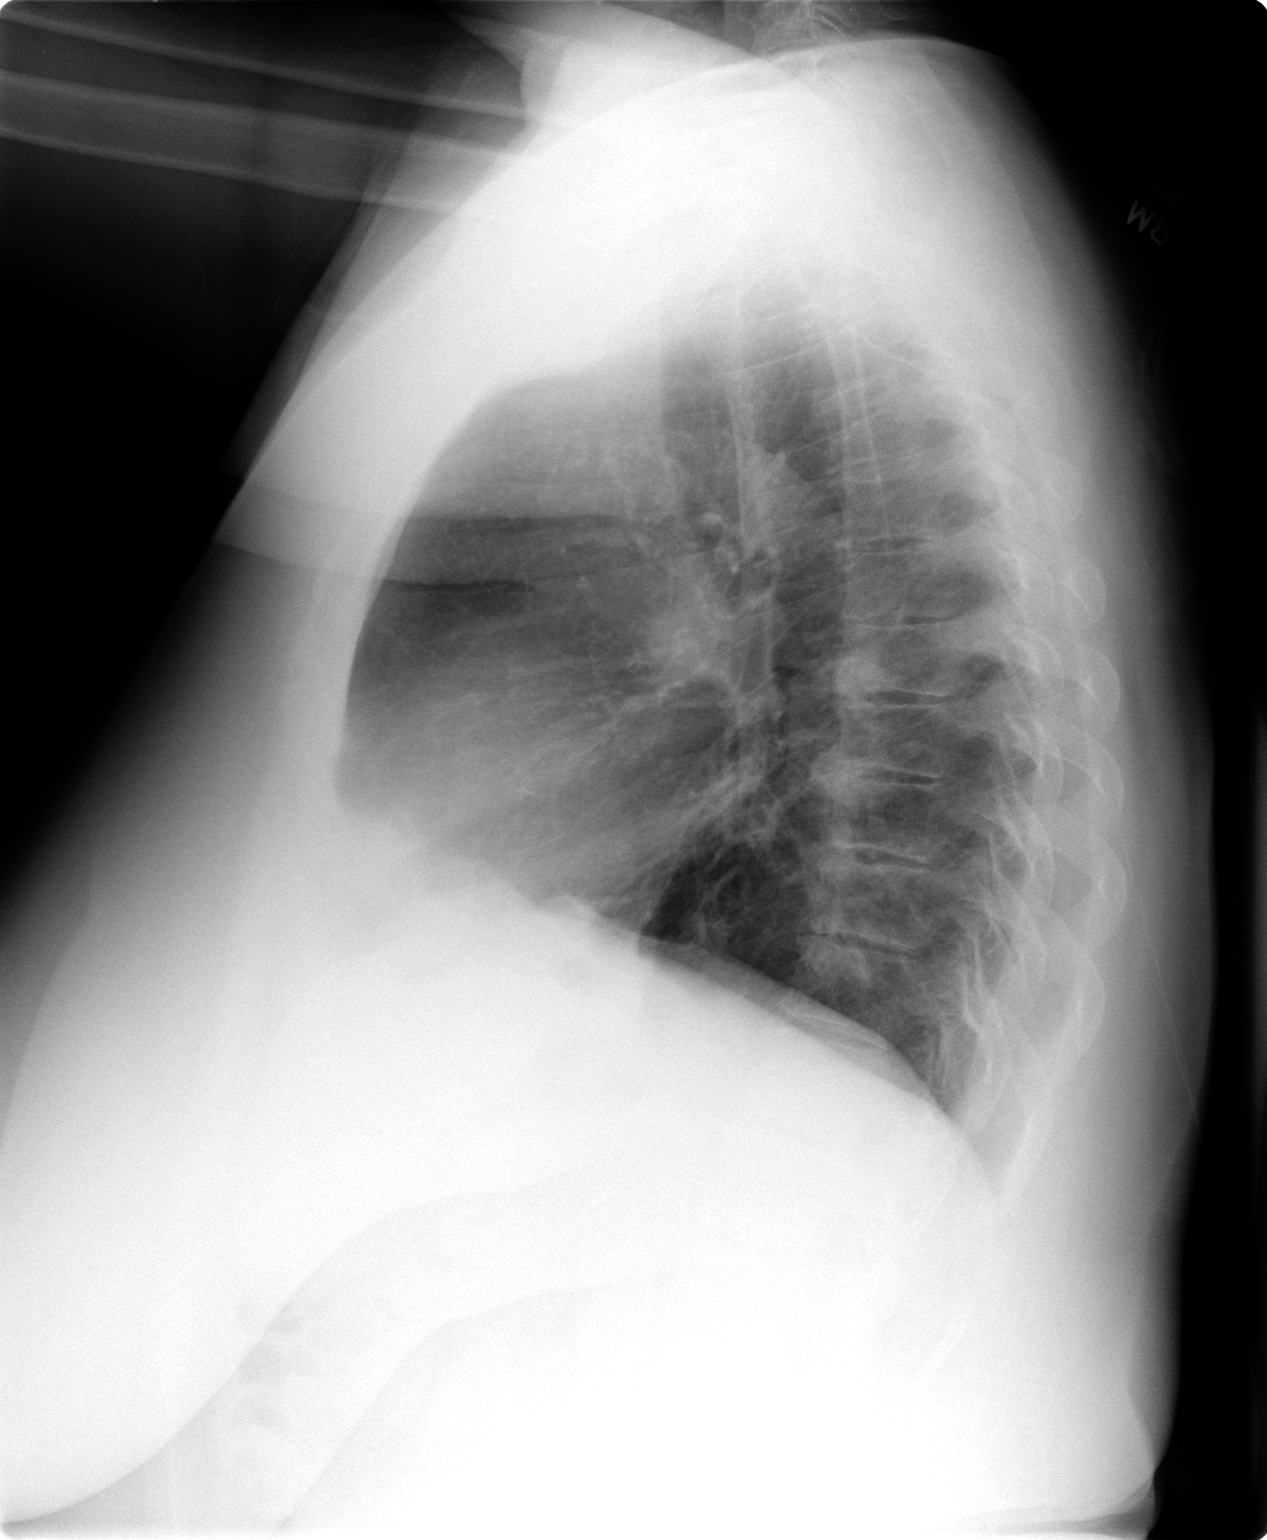

[2 of 2 positions shown; findings below may reference images not displayed]

FINDINGS: There has been interval continued improvement in bilateral cavitary lung lesions.  One can still see a small residual cavity in the right upper lobe, and perhaps some minimal residual cavity in the left lower lobe.  The lungs are otherwise clear.  No new findings.  Heart and mediastinum remain normal.
IMPRESSION: Continued improvement with still some minimal residual cavities in the right upper lobe and left lower lobe.

## 2005-12-24 ENCOUNTER — Ambulatory Visit (HOSPITAL_COMMUNITY): Admission: RE | Admit: 2005-12-24 | Discharge: 2005-12-24 | Payer: Self-pay | Admitting: Family Medicine

## 2005-12-24 IMAGING — CR DG CHEST 2V
2 series · 2 of 2 positions shown · non-contrast
Comparison: [DATE].

CLINICAL DATA: Productive cough.   History of pneumonia. 
 CHEST - 2 VIEW:

[view not recorded (1 of 2)]
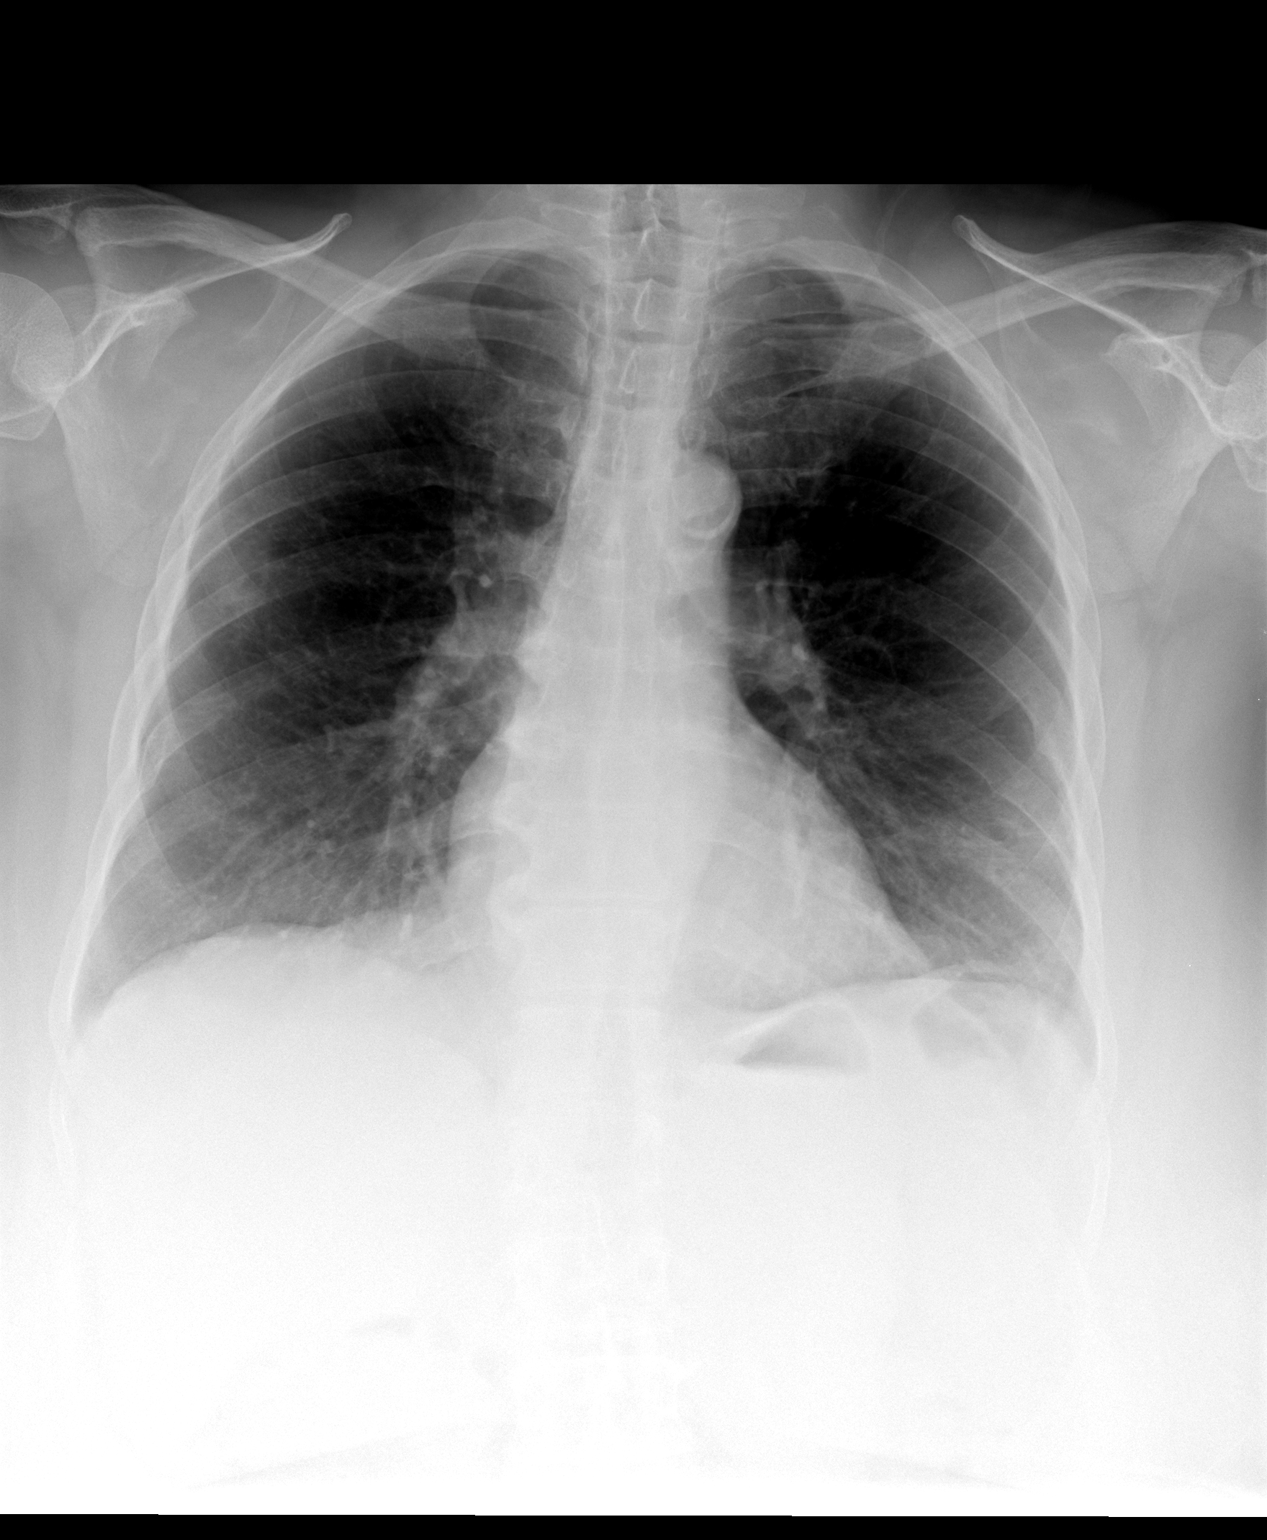

[view not recorded (2 of 2)]
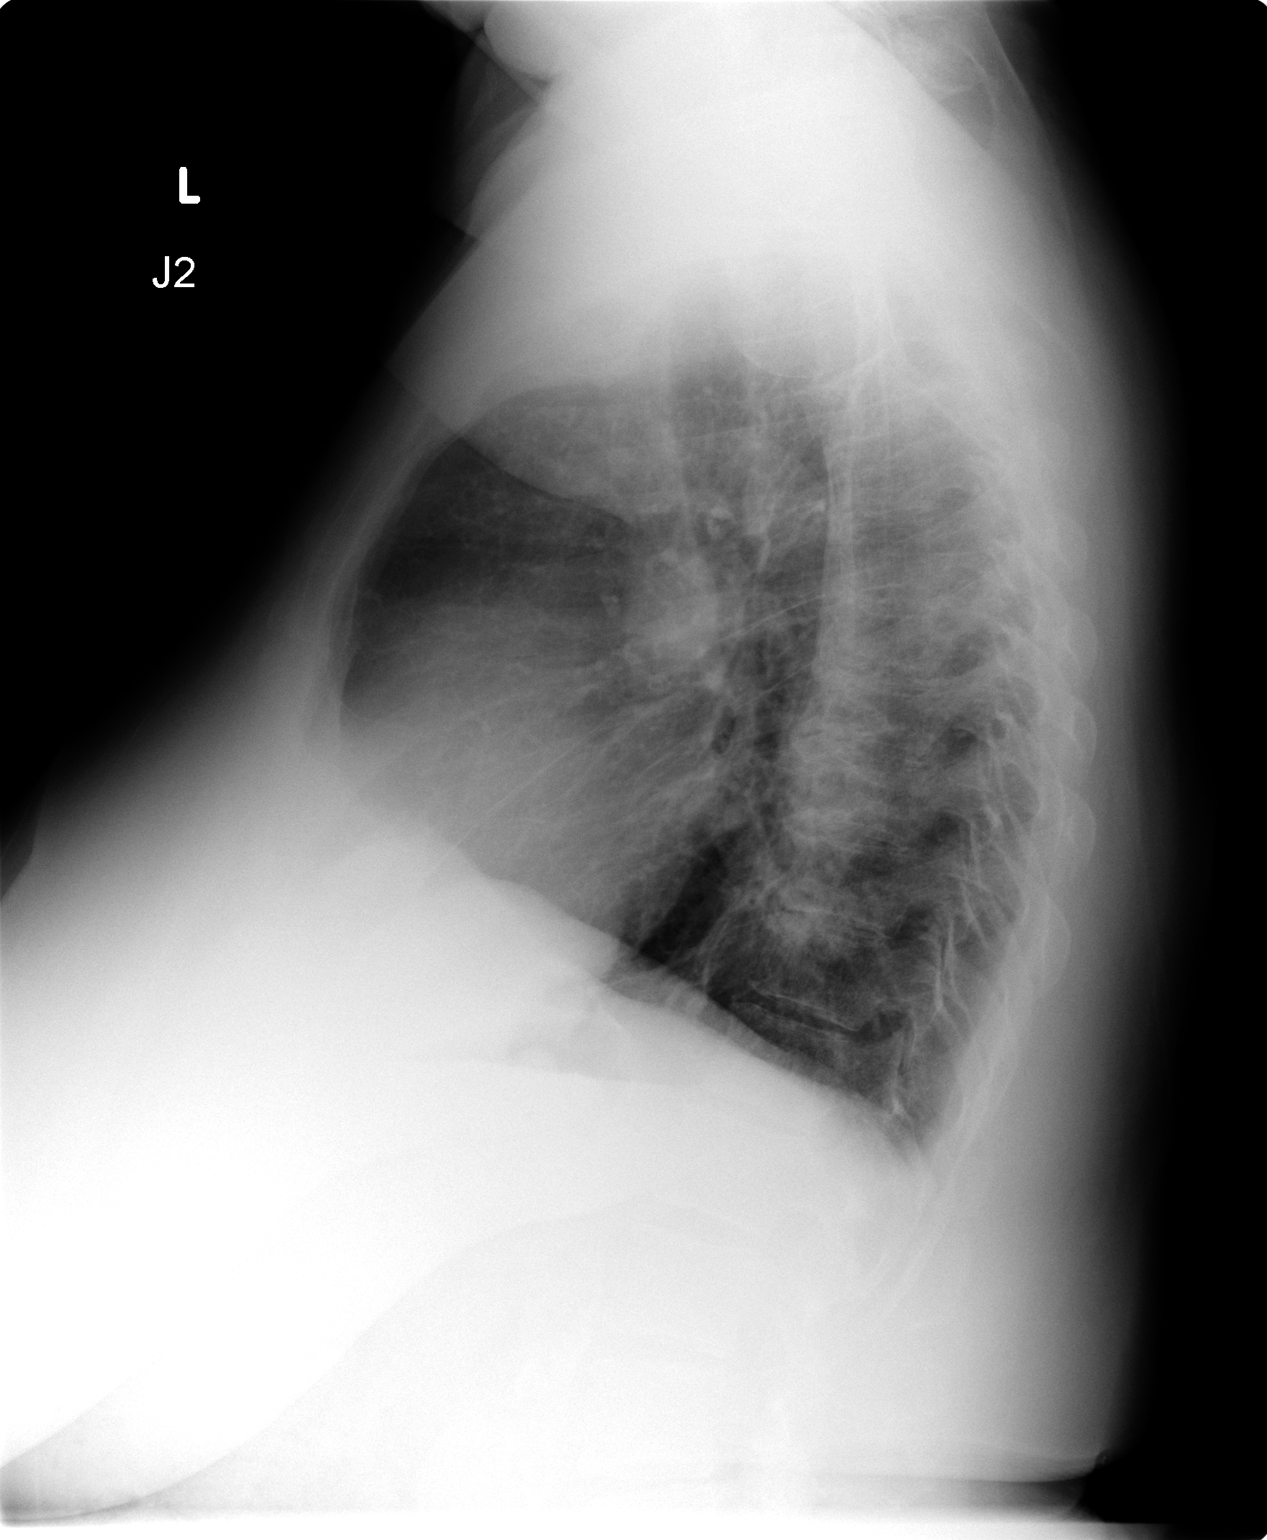

[2 of 2 positions shown; findings below may reference images not displayed]

[DATE] cm cavity in the right upper lobe remains present and is slightly less prominent.  Cavity in the left lung base is unchanged.  There is some mild left lower lobe atelectasis.  No definite pneumonia is seen.  There is no effusion. There is COPD with hyperinflation.
IMPRESSION: Right upper lobe and left lower lobe cavitary lesions remain present.  Compared with a CT from [DATE], they appear significantly improved and are likely due to areas of cavitary pneumonia.   Continued followup until complete clearing with CT scanning is suggested to exclude an underlying mass lesion.

## 2006-01-28 ENCOUNTER — Ambulatory Visit (HOSPITAL_COMMUNITY): Admission: RE | Admit: 2006-01-28 | Discharge: 2006-01-28 | Payer: Self-pay | Admitting: Family Medicine

## 2006-01-28 IMAGING — CT CT CHEST W/O CM
1 series · 15 of 33 positions shown, 19 images · IV contrast (agent unspecified)
Comparison: Prior CT of [DATE].

CLINICAL DATA: Cavitary lung lesions.  Follow-up.
 CHEST CT WITHOUT CONTRAST:
TECHNIQUE: Multidetector CT imaging of the chest was performed following the standard protocol without IV contrast.

[Series 2: chest_routine 5.0 b40f st · axial · 0.71mm/px · z∈[-275,-35]mm · 15 of 58 slices shown, 19 images]
[im 5/58  mediastinal]
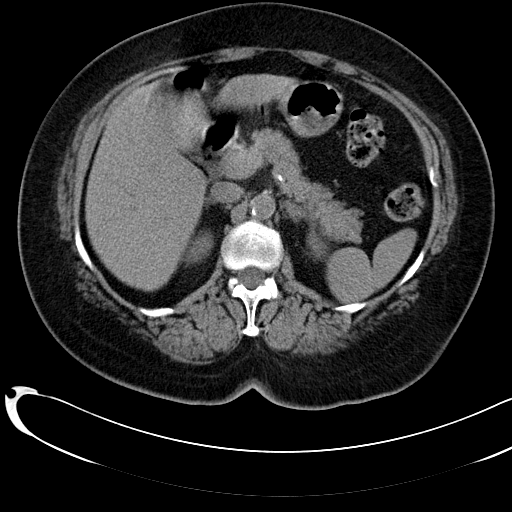
[im 5/58  lung]
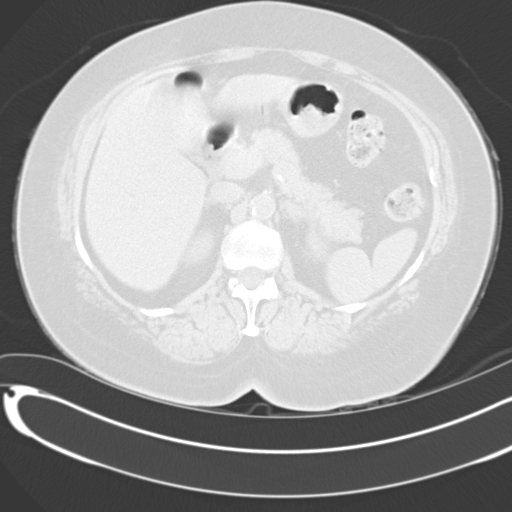
[im 9/58  lung]
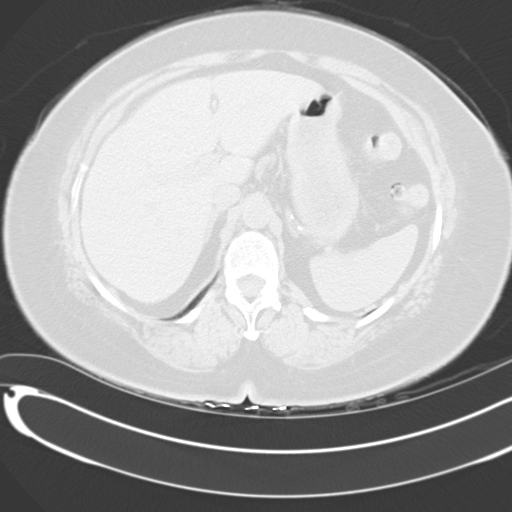
[im 12/58  lung]
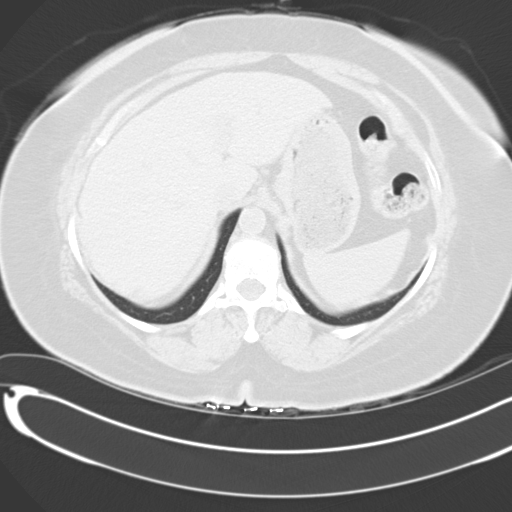
[im 15/58  lung]
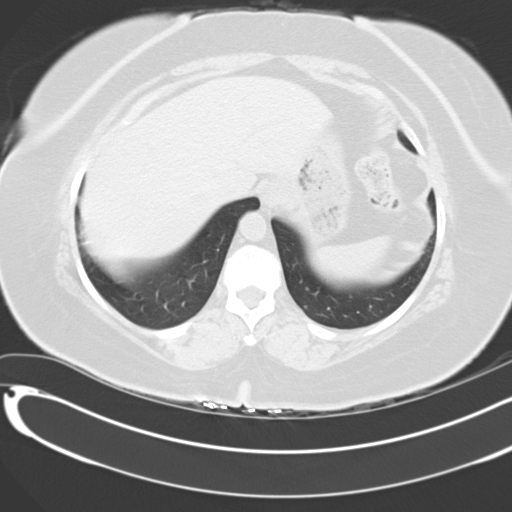
[im 20/58  mediastinal]
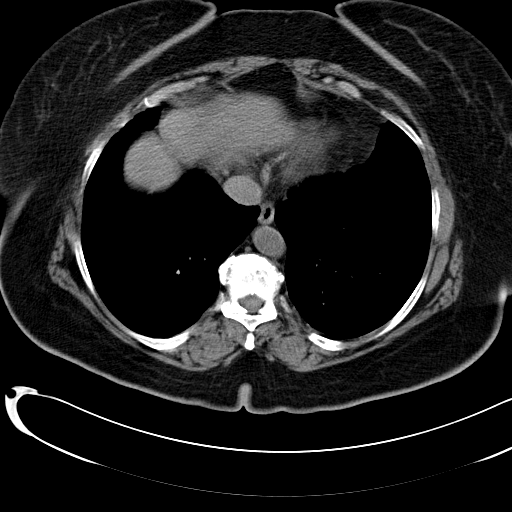
[im 20/58  lung]
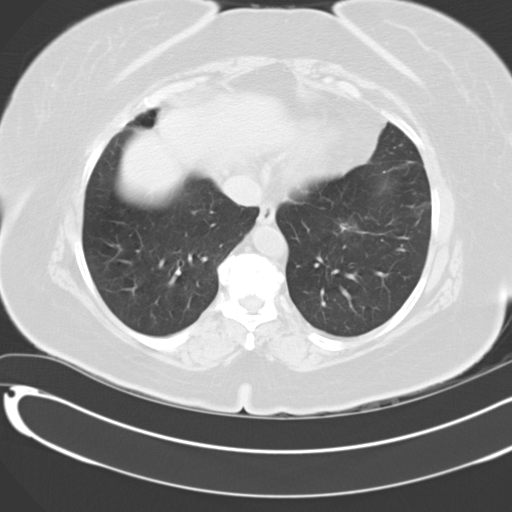
[im 23/58  lung]
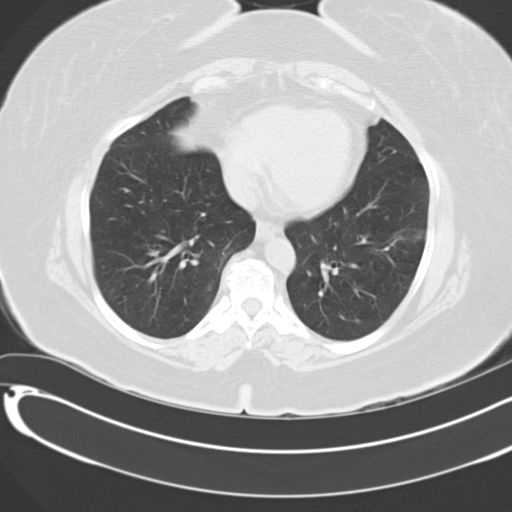
[im 26/58  lung]
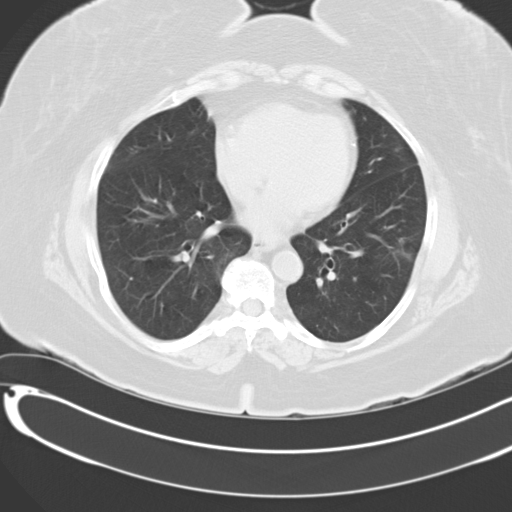
[im 30/58  lung]
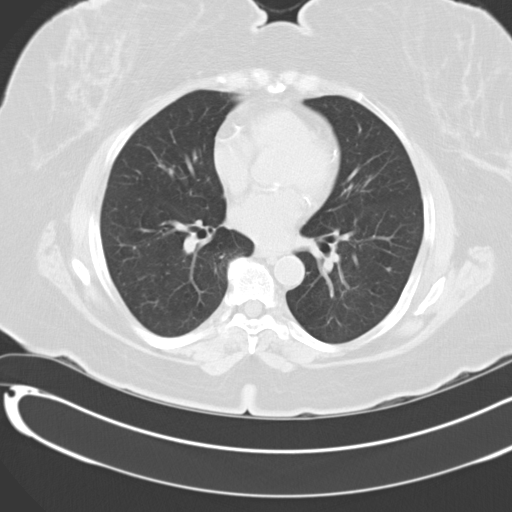
[im 32/58  mediastinal]
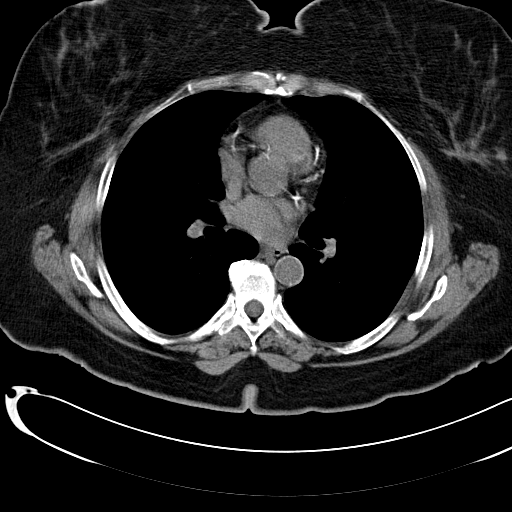
[im 32/58  lung]
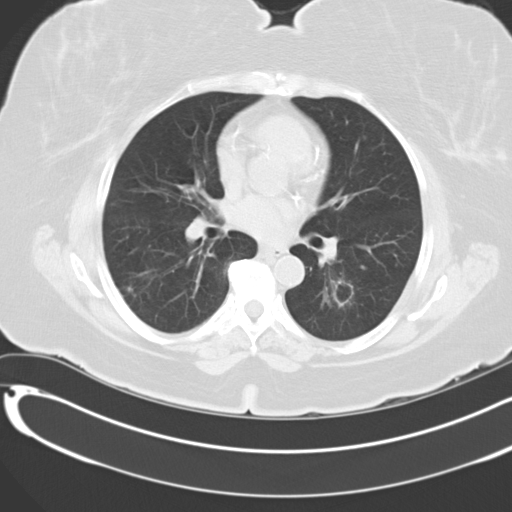
[im 35/58  lung]
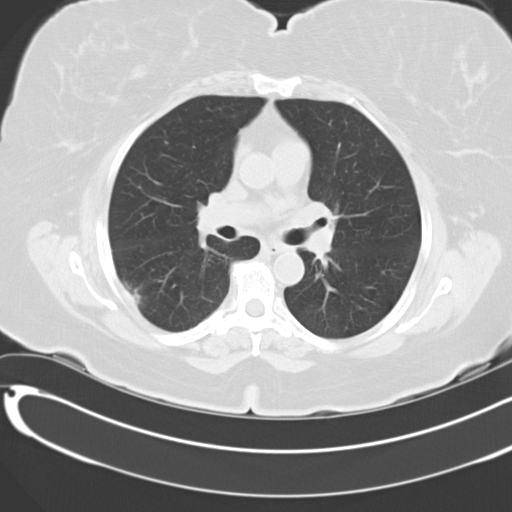
[im 39/58  lung]
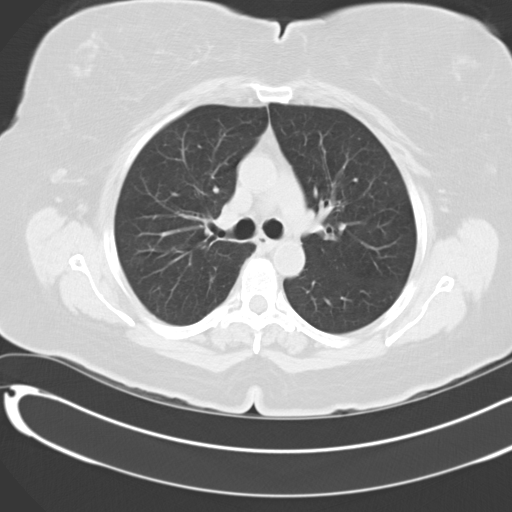
[im 43/58  lung]
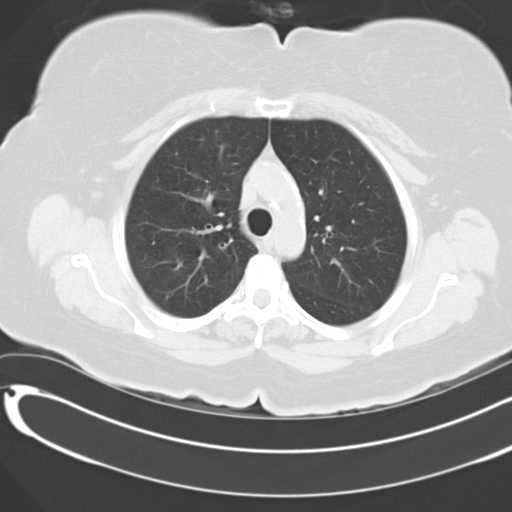
[im 46/58  mediastinal]
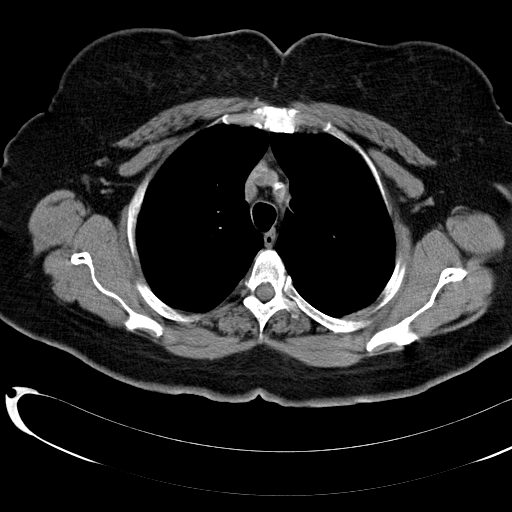
[im 46/58  lung]
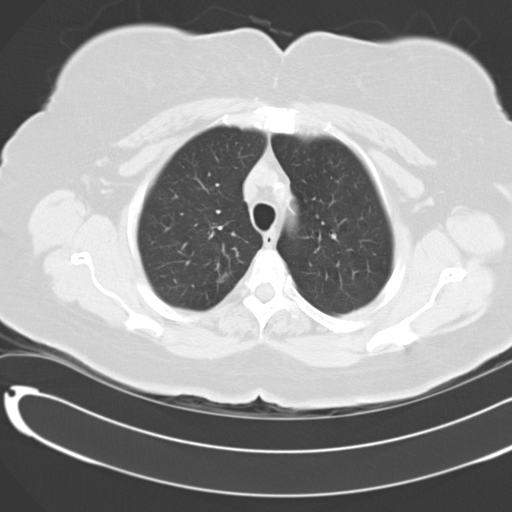
[im 49/58  lung]
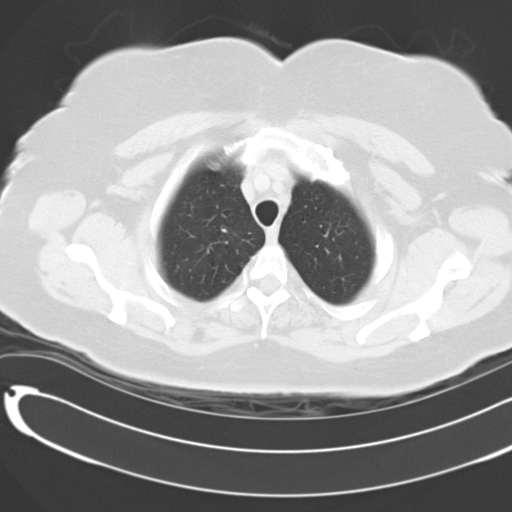
[im 53/58  lung]
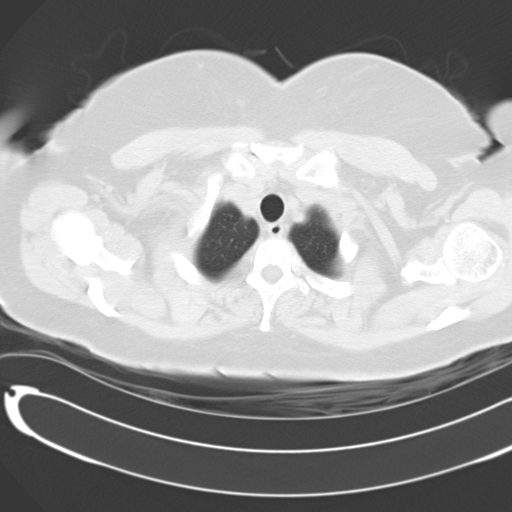

[15 of 33 positions shown; findings below may reference images not displayed]

FINDINGS: The pleural based cavitary lesion in the superior segment of the right lower lobe is almost completely resolved with only minimal scarring remaining peripherally on image #25.  The cavitary lesion in the superior segment of the left lower lobe also has improved significantly with only a small cavity remaining with no air fluid level on image #27.  The peripheral cavitary lesion in the left lower lobe on image #34 has almost completely resolved with only a small cavity remaining with no fluid.  Small lung nodules are present, particularly in the left lower lobe and an additional follow-up CT in 4-6 months is recommended.  No pleural effusion is seen.  No mediastinal or hilar adenopathy is seen.  The previously noted, precarinal node has decreased in size now measuring no more than 12 mm in diameter compared to 11 x 14 mm previously.  Coronary artery calcifications are noted.
IMPRESSION: 1.  Significant improvement with almost complete resolution of cavitary lesions noted previously.  
 2.  There are smaller nodules, particularly in the left lower lobe which are not as well seen on the prior CT and continued follow-up with a CT chest in 4-6 months is recommended.

## 2006-05-03 ENCOUNTER — Encounter: Admission: RE | Admit: 2006-05-03 | Discharge: 2006-05-03 | Payer: Self-pay | Admitting: General Surgery

## 2006-06-03 ENCOUNTER — Ambulatory Visit (HOSPITAL_COMMUNITY): Admission: RE | Admit: 2006-06-03 | Discharge: 2006-06-03 | Payer: Self-pay | Admitting: Family Medicine

## 2006-06-03 IMAGING — CT CT CHEST W/O CM
1 series · 16 of 31 positions shown, 20 images · IV contrast (agent unspecified)
Comparison: [DATE] and [DATE].

CLINICAL DATA: Followup lung lesions. 
 CHEST CT WITHOUT CONTRAST:
TECHNIQUE: Multidetector CT imaging of the chest was performed following the standard protocol without IV contrast.

[Series 2: chest_routine 5.0 b40f st · axial · 0.66mm/px · z∈[-307,-32]mm · 16 of 61 slices shown, 20 images]
[im 3/61  mediastinal]
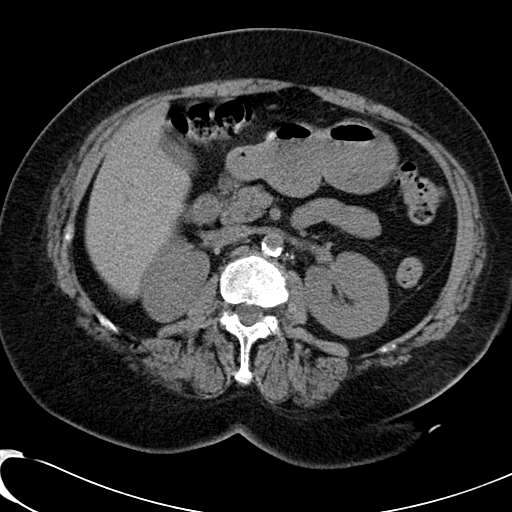
[im 3/61  lung]
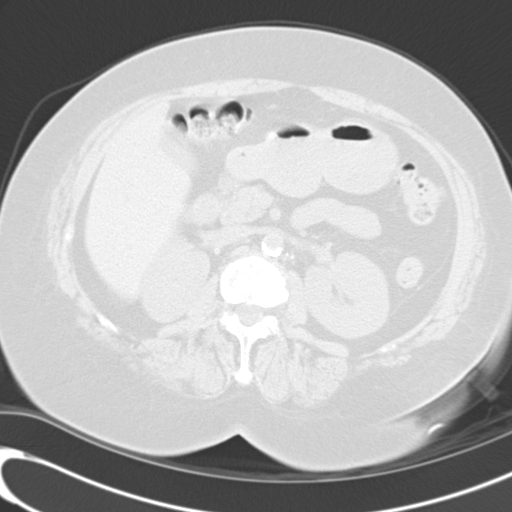
[im 7/61  lung]
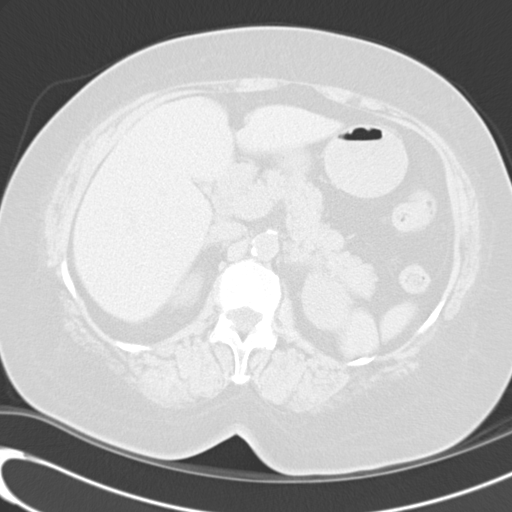
[im 12/61  lung]
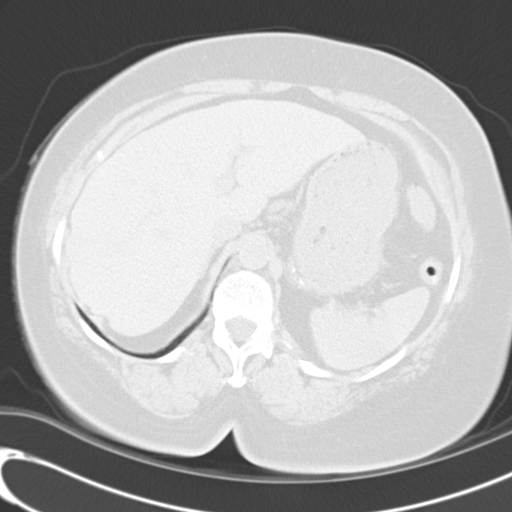
[im 14/61  lung]
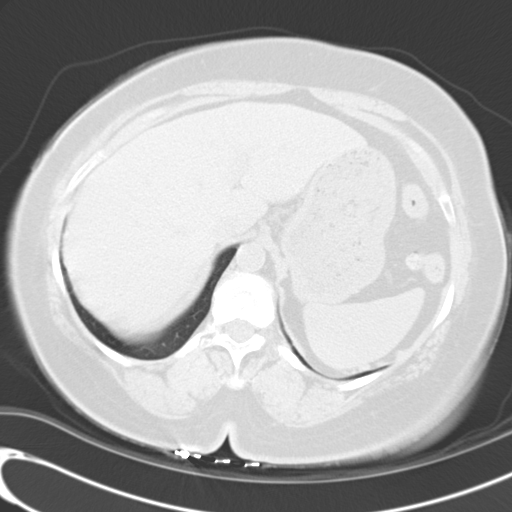
[im 18/61  mediastinal]
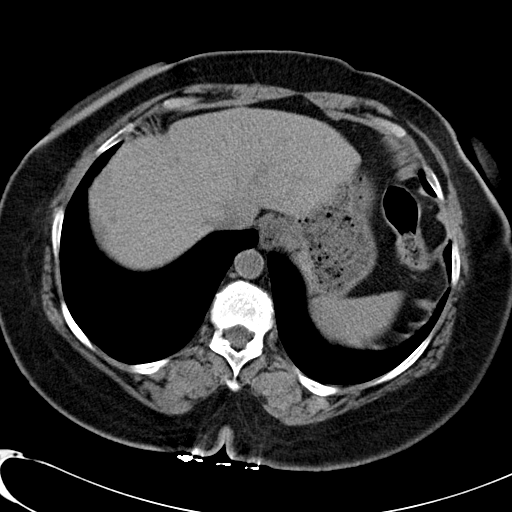
[im 18/61  lung]
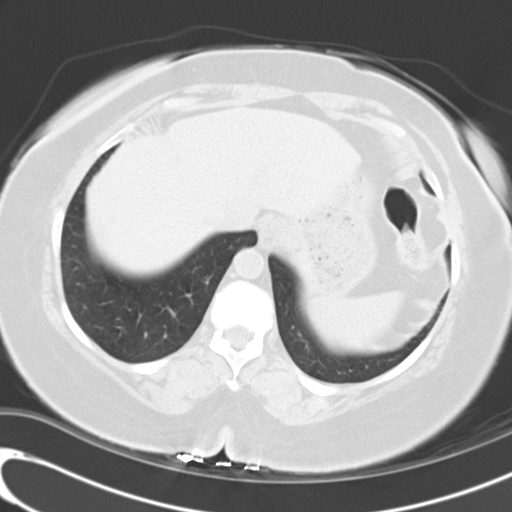
[im 21/61  lung]
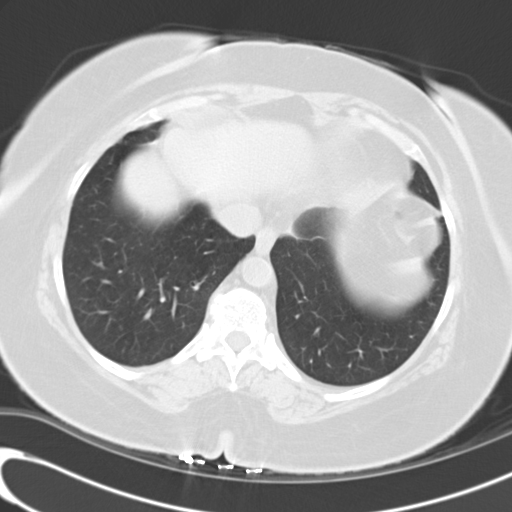
[im 25/61  lung]
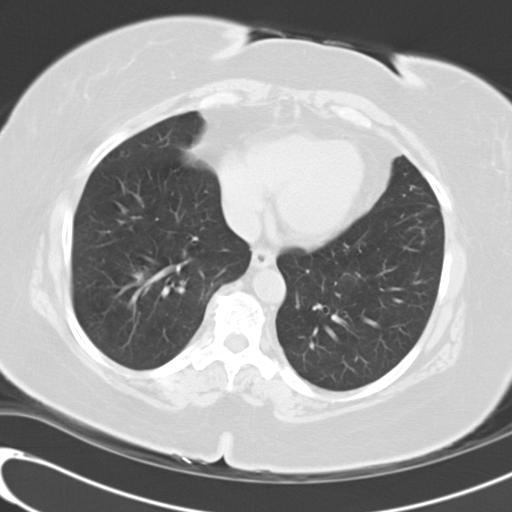
[im 29/61  lung]
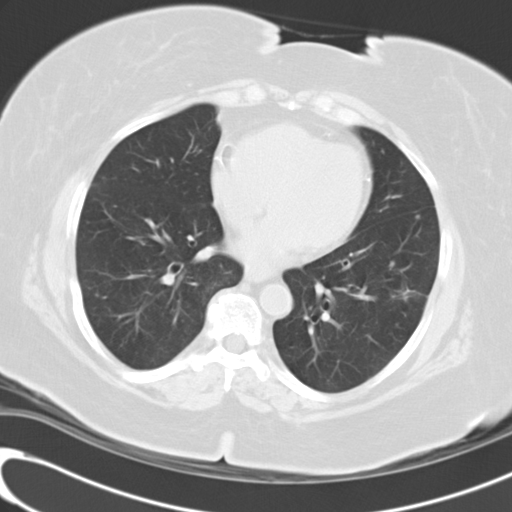
[im 33/61  mediastinal]
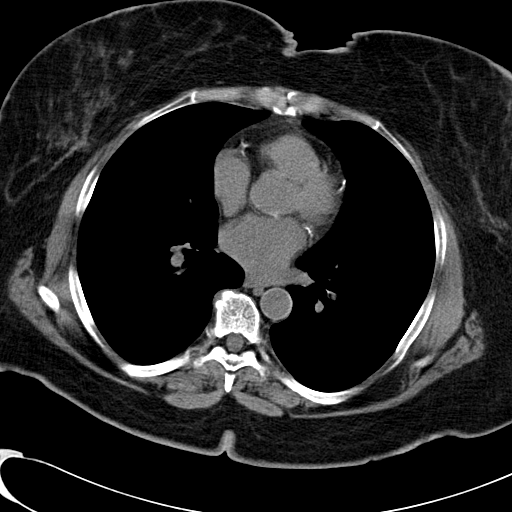
[im 33/61  lung]
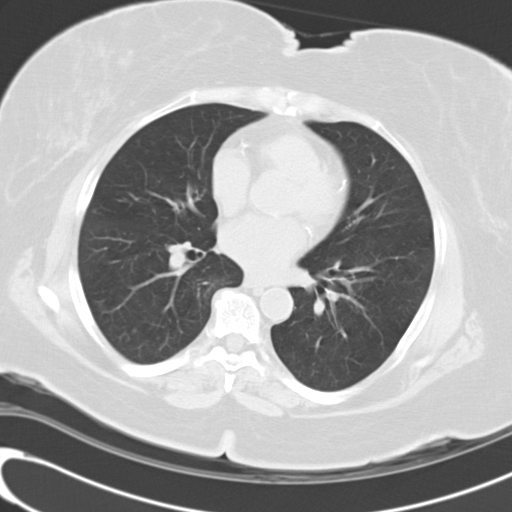
[im 36/61  lung]
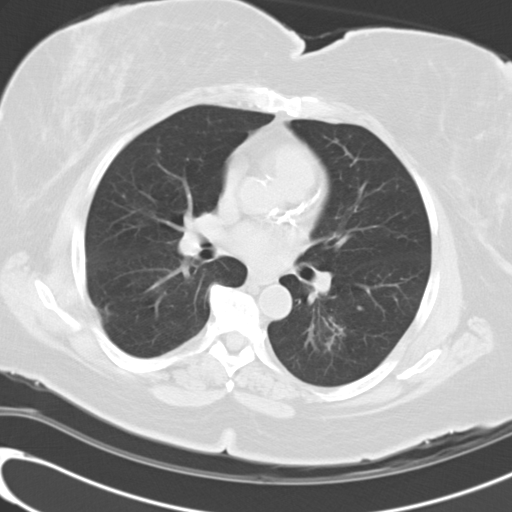
[im 38/61  lung]
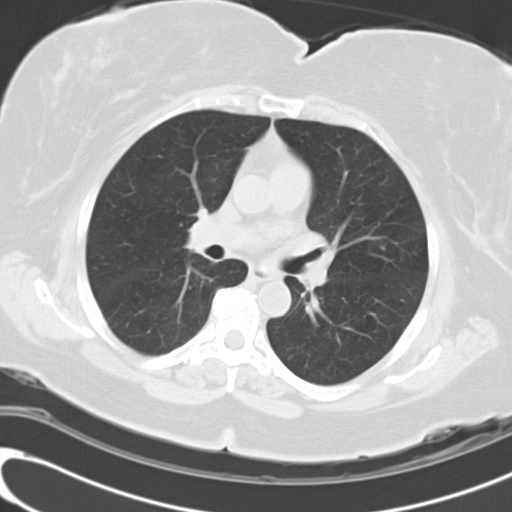
[im 41/61  lung]
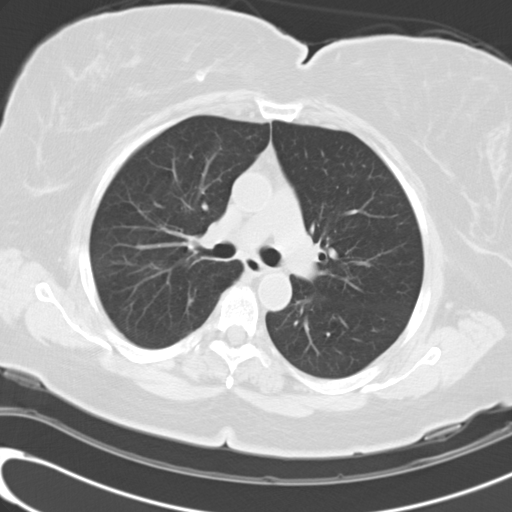
[im 45/61  mediastinal]
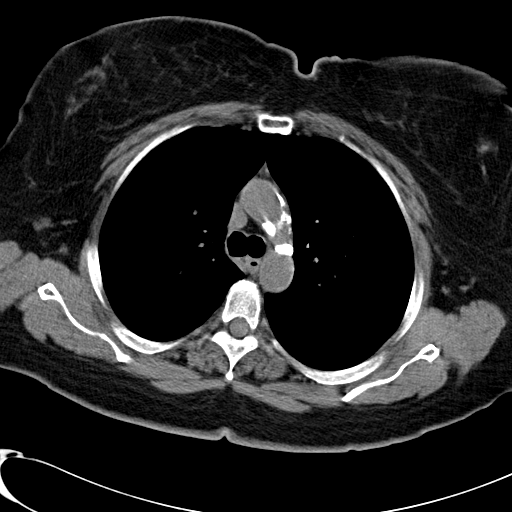
[im 45/61  lung]
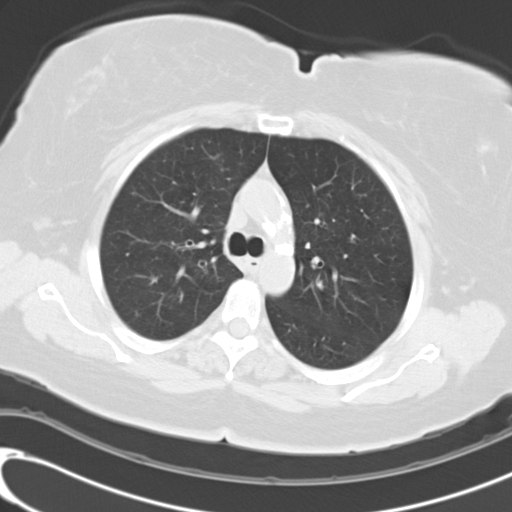
[im 49/61  lung]
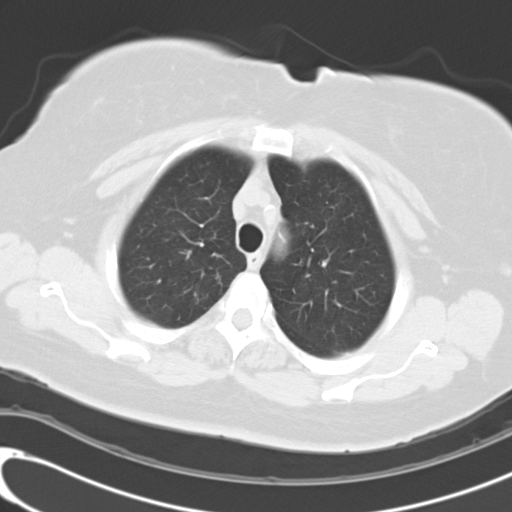
[im 54/61  lung]
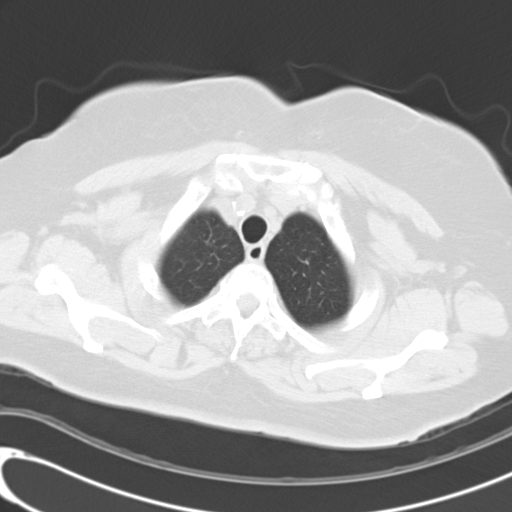
[im 58/61  lung]
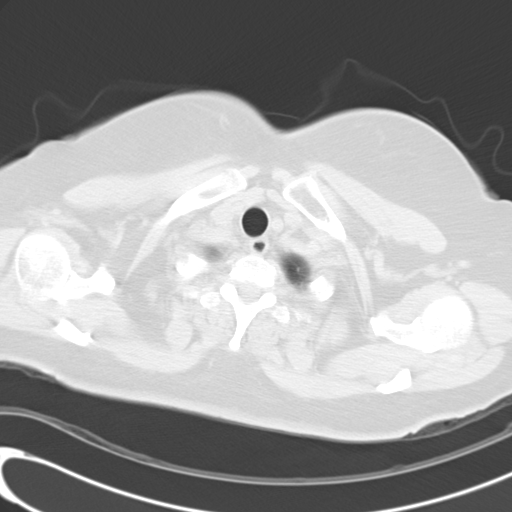

[16 of 31 positions shown; findings below may reference images not displayed]

FINDINGS: Mediastinal lymph nodes are small in size.  No definite hilar or axillary lymphadenopathy.  Heart size normal.  Coronary artery calcification.  No pericardial fluid. 
 Emphysema.  There has been interval near complete resolution of previously seen areas of cavitary airspace disease.  Scattered tiny nodules measure less than 4 mm in size bilaterally.  No pleural fluid.  Airway unremarkable.  Incidental imaging of the upper abdomen reveals two faint low attenuation lesions in the liver.
IMPRESSION: 1.  Near complete resolution of previously seen cavitary lung lesions. 
 2.  Scattered pulmonary nodules measure 4 mm or less in size.  If this patient has a smoking history, a 1 year followup can be performed.

## 2007-05-07 ENCOUNTER — Encounter: Admission: RE | Admit: 2007-05-07 | Discharge: 2007-05-07 | Payer: Self-pay

## 2007-11-18 ENCOUNTER — Ambulatory Visit (HOSPITAL_COMMUNITY): Admission: RE | Admit: 2007-11-18 | Discharge: 2007-11-18 | Payer: Self-pay | Admitting: Family Medicine

## 2007-11-18 IMAGING — CR DG CHEST 2V
2 series · 2 of 2 positions shown · non-contrast
Comparison: [DATE], [DATE]

CLINICAL DATA: Cough, hypertension

CHEST - 2 VIEW

[w chest pa]
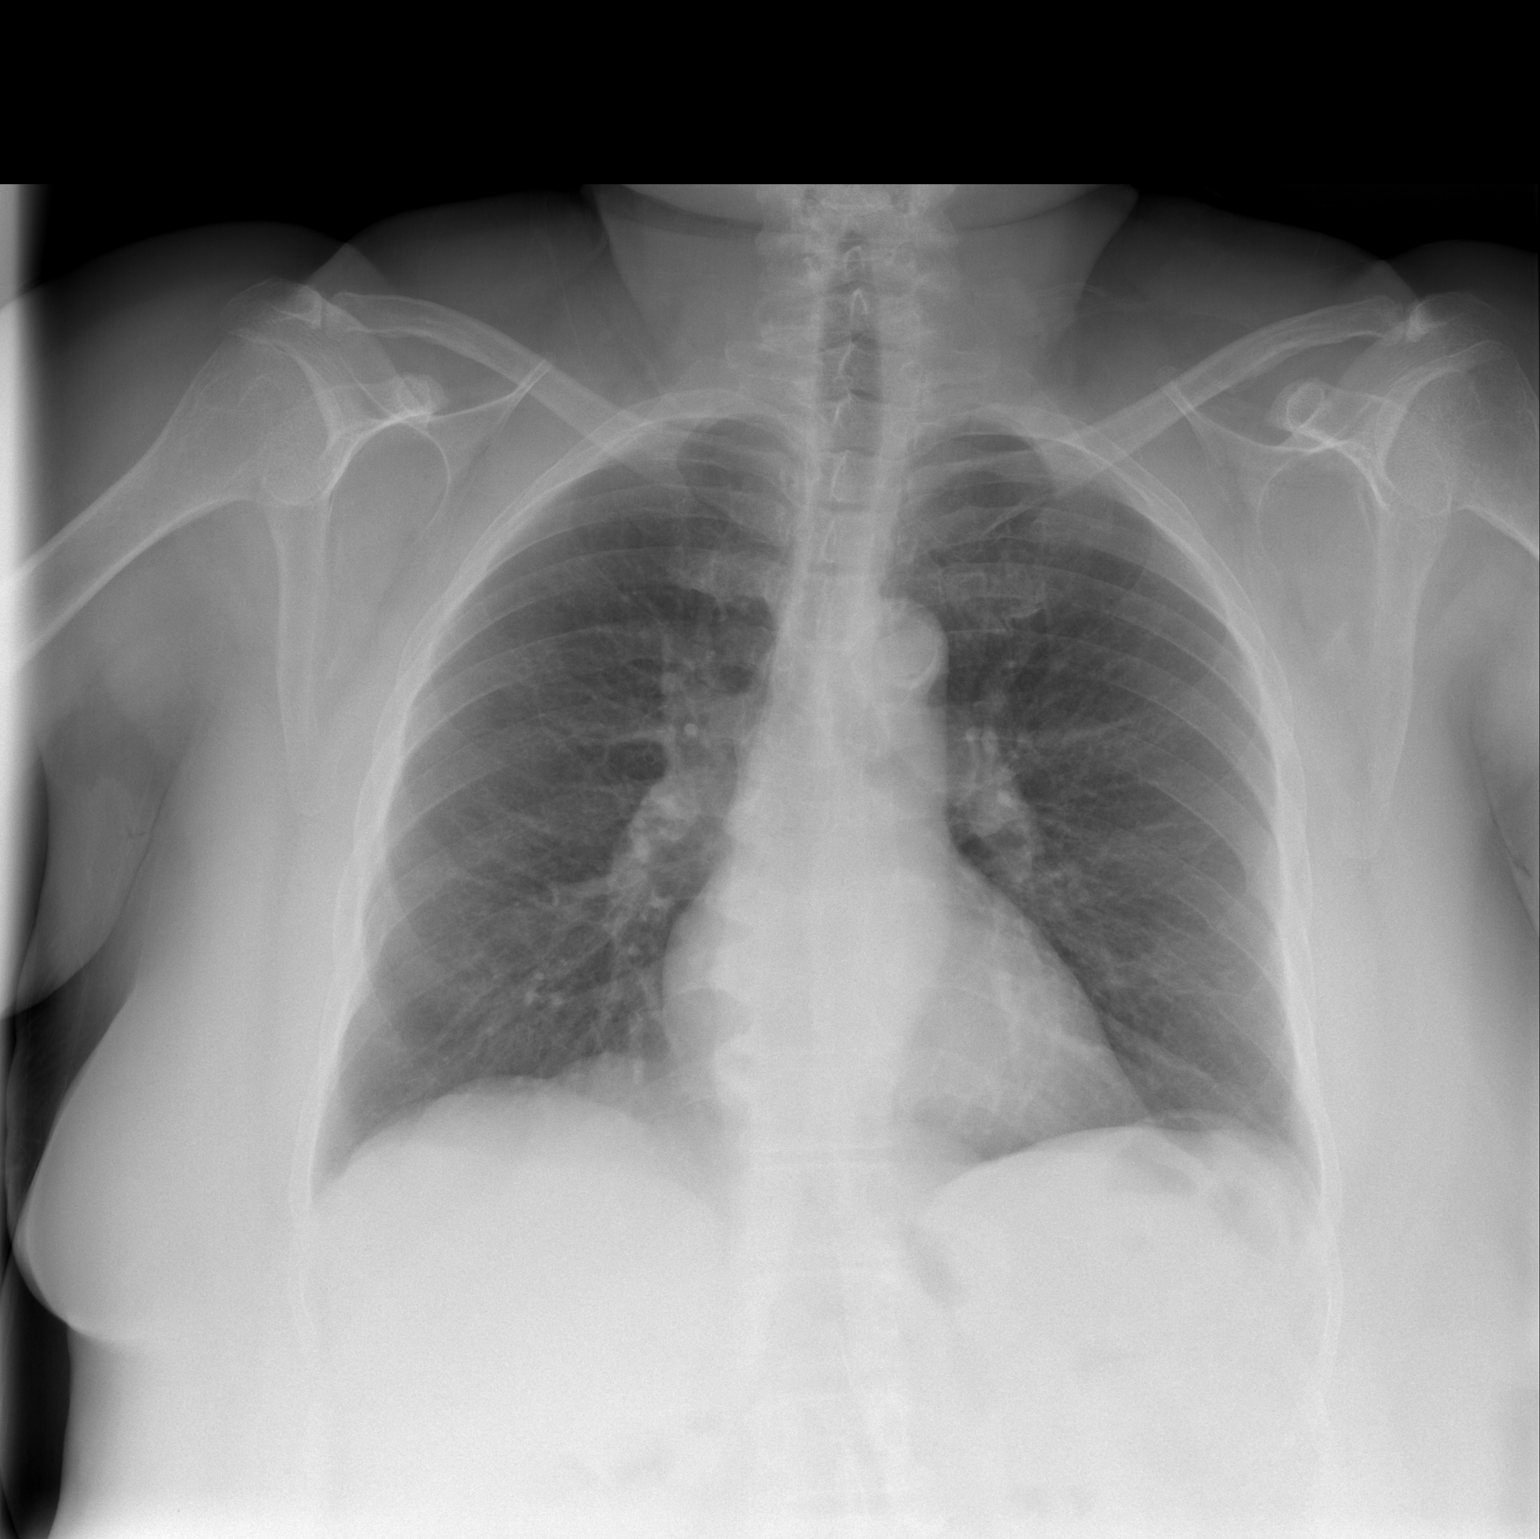

[w chest lat]
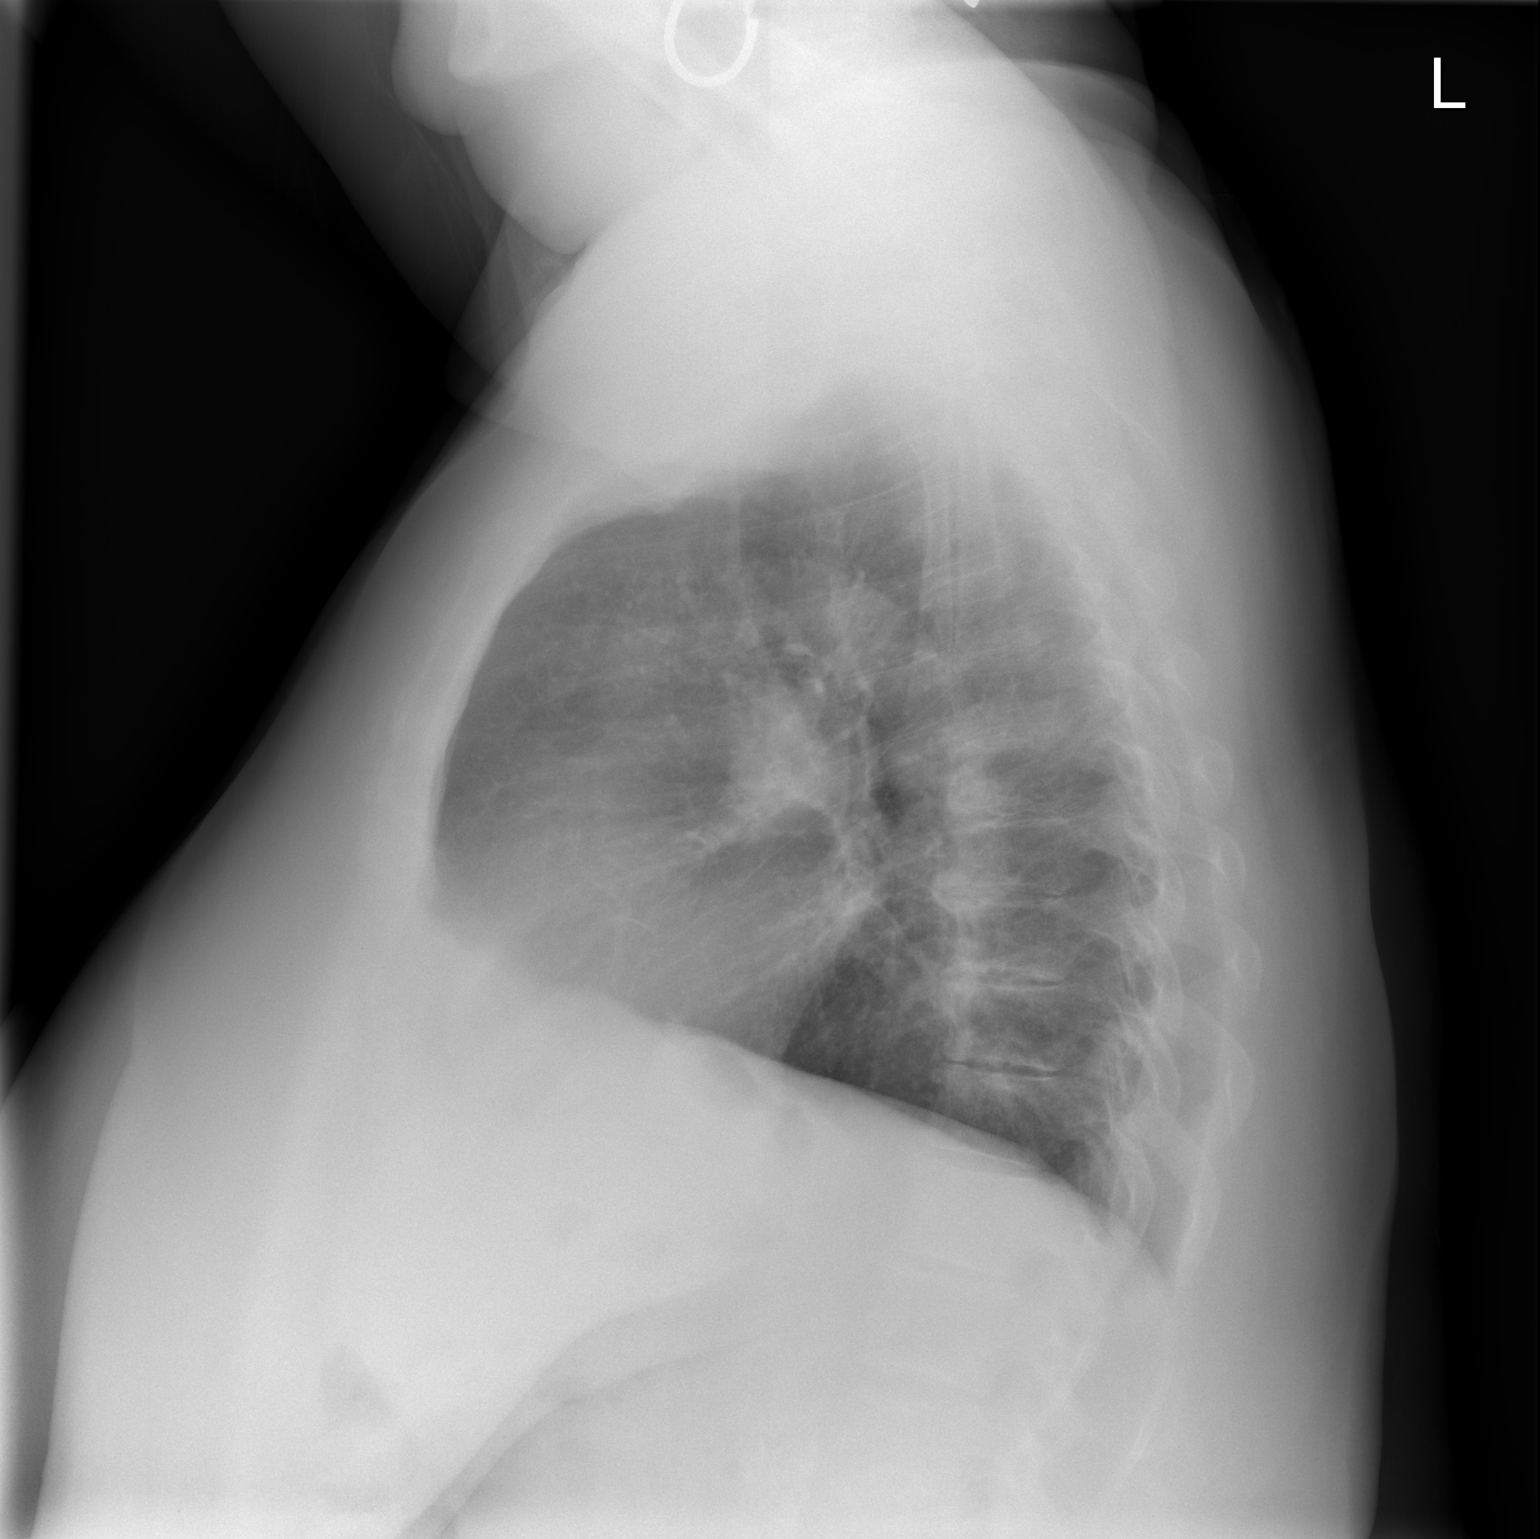

[2 of 2 positions shown; findings below may reference images not displayed]

FINDINGS: Previous cavitary lesions in the right upper lobe and
left lower lobe are no longer evident by plain radiography.  Normal
heart size and vascularity.  No current acute airspace disease,
pneumonia, edema, effusion or pneumothorax.  Trachea is midline.
Atherosclerosis of the aorta.  Degenerative changes of the thoracic
spine.
IMPRESSION: No acute chest process.

## 2007-11-26 ENCOUNTER — Encounter: Admission: RE | Admit: 2007-11-26 | Discharge: 2007-11-26 | Payer: Self-pay | Admitting: Family Medicine

## 2007-11-27 ENCOUNTER — Ambulatory Visit (HOSPITAL_BASED_OUTPATIENT_CLINIC_OR_DEPARTMENT_OTHER): Admission: RE | Admit: 2007-11-27 | Discharge: 2007-11-27 | Payer: Self-pay | Admitting: Family Medicine

## 2007-11-30 ENCOUNTER — Ambulatory Visit: Payer: Self-pay | Admitting: Internal Medicine

## 2008-05-12 ENCOUNTER — Encounter: Admission: RE | Admit: 2008-05-12 | Discharge: 2008-05-12 | Payer: Self-pay | Admitting: Family Medicine

## 2008-08-24 ENCOUNTER — Emergency Department (HOSPITAL_COMMUNITY): Admission: EM | Admit: 2008-08-24 | Discharge: 2008-08-24 | Payer: Self-pay | Admitting: Emergency Medicine

## 2008-08-24 IMAGING — CR DG CHEST 2V
2 series · 2 of 2 positions shown · non-contrast
Comparison: [DATE]

CLINICAL DATA: Cough.  Back pain. Previous smoker.  Hypertension.

CHEST - 2 VIEW

[w chest pa]
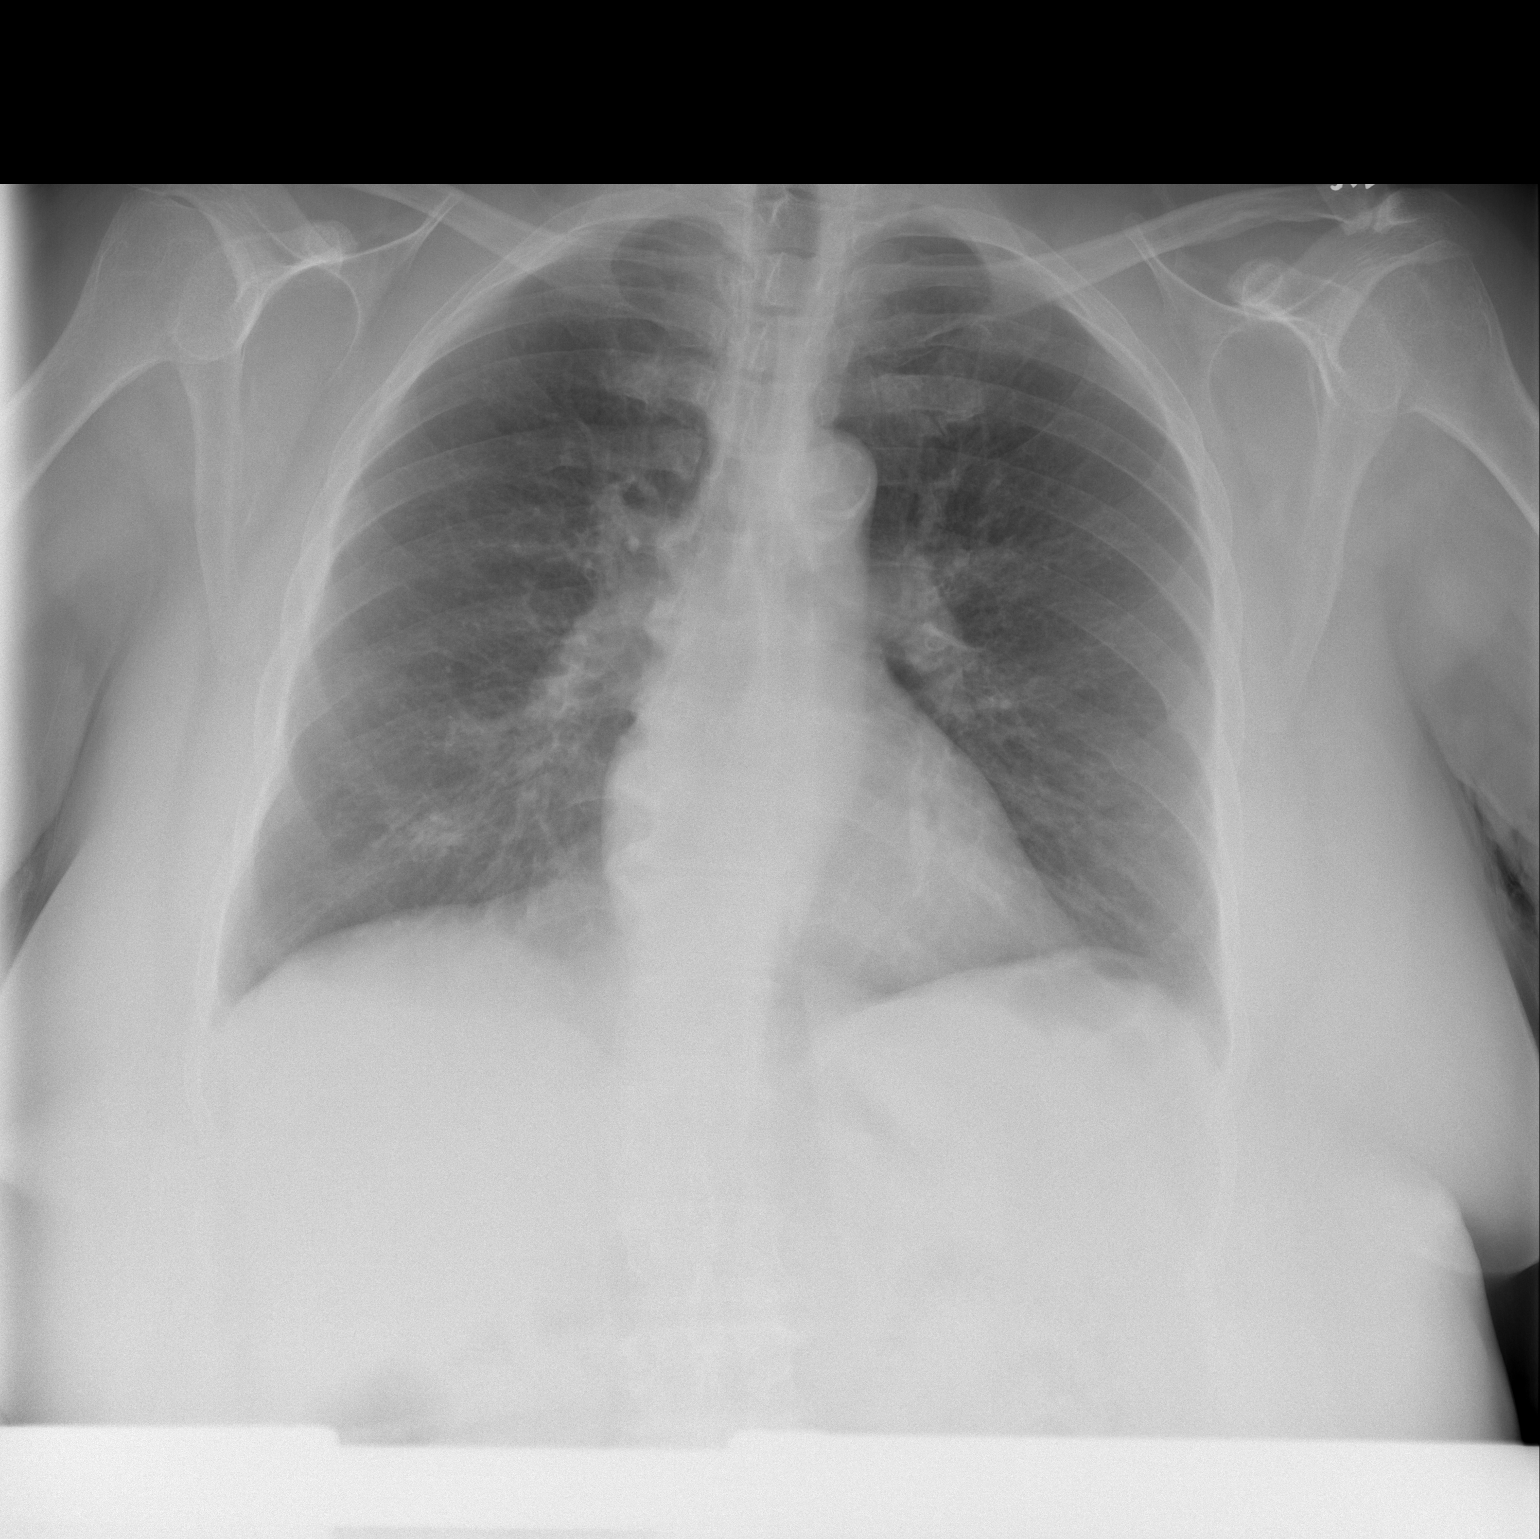

[w chest lat]
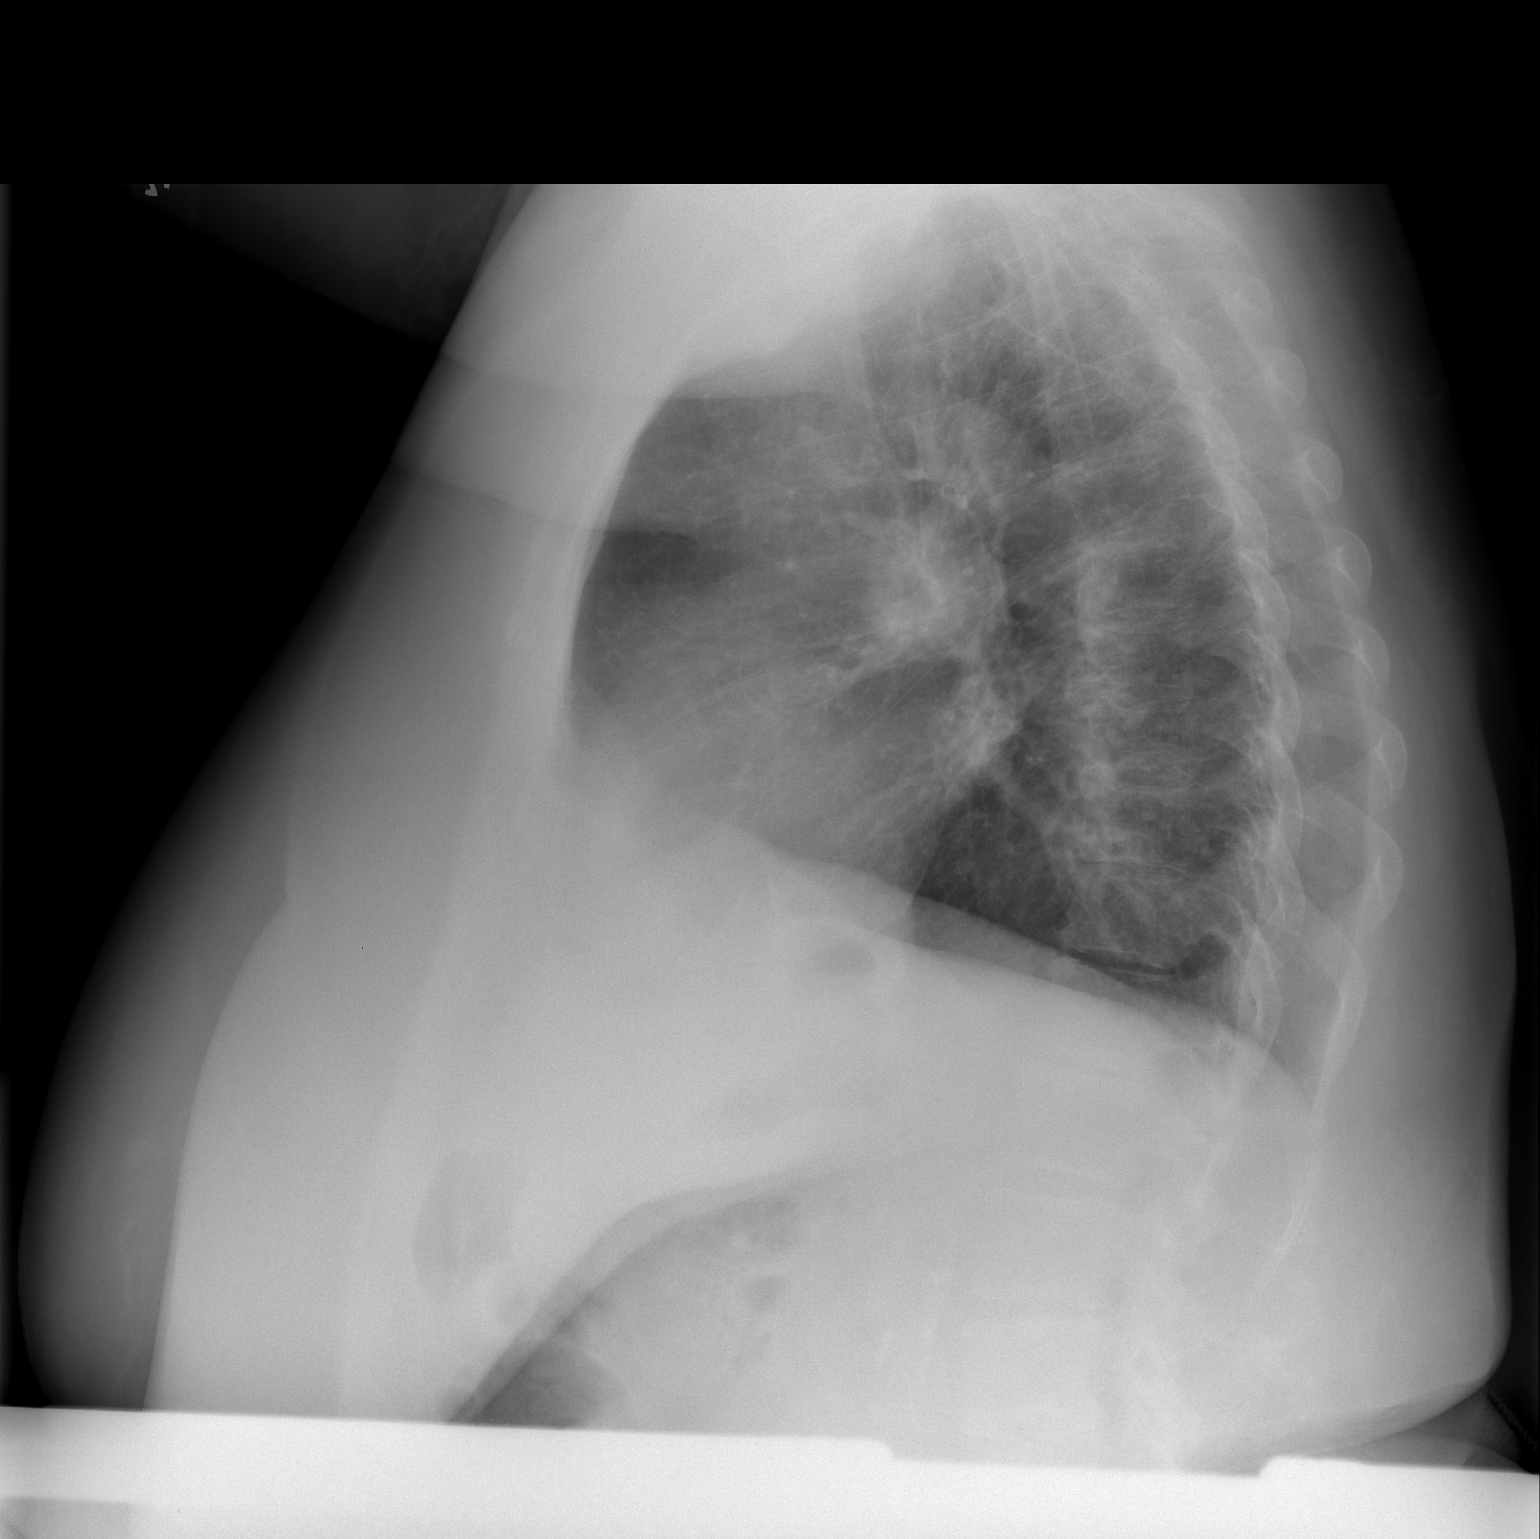

[2 of 2 positions shown; findings below may reference images not displayed]

FINDINGS: The heart size and mediastinal contours are within normal
limits.  Both lungs are clear.  The visualized skeletal structures
are unremarkable.
IMPRESSION: No active cardiopulmonary disease.

## 2008-09-20 ENCOUNTER — Other Ambulatory Visit: Admission: RE | Admit: 2008-09-20 | Discharge: 2008-09-20 | Payer: Self-pay | Admitting: Family Medicine

## 2008-11-05 ENCOUNTER — Ambulatory Visit: Admission: RE | Admit: 2008-11-05 | Discharge: 2008-11-05 | Payer: Self-pay | Admitting: Family Medicine

## 2008-11-05 ENCOUNTER — Encounter (INDEPENDENT_AMBULATORY_CARE_PROVIDER_SITE_OTHER): Payer: Self-pay | Admitting: Family Medicine

## 2008-11-05 ENCOUNTER — Ambulatory Visit: Payer: Self-pay | Admitting: Vascular Surgery

## 2008-11-17 ENCOUNTER — Ambulatory Visit (HOSPITAL_COMMUNITY): Admission: RE | Admit: 2008-11-17 | Discharge: 2008-11-17 | Payer: Self-pay | Admitting: Family Medicine

## 2008-11-17 IMAGING — CR DG HIP (WITH OR WITHOUT PELVIS) 2-3V*L*
3 series · 3 of 3 positions shown · non-contrast
Comparison: [DATE]

CLINICAL DATA: Worsening left hip pain.

LEFT HIP - COMPLETE 2+ VIEW

[t pelvis a.p.]
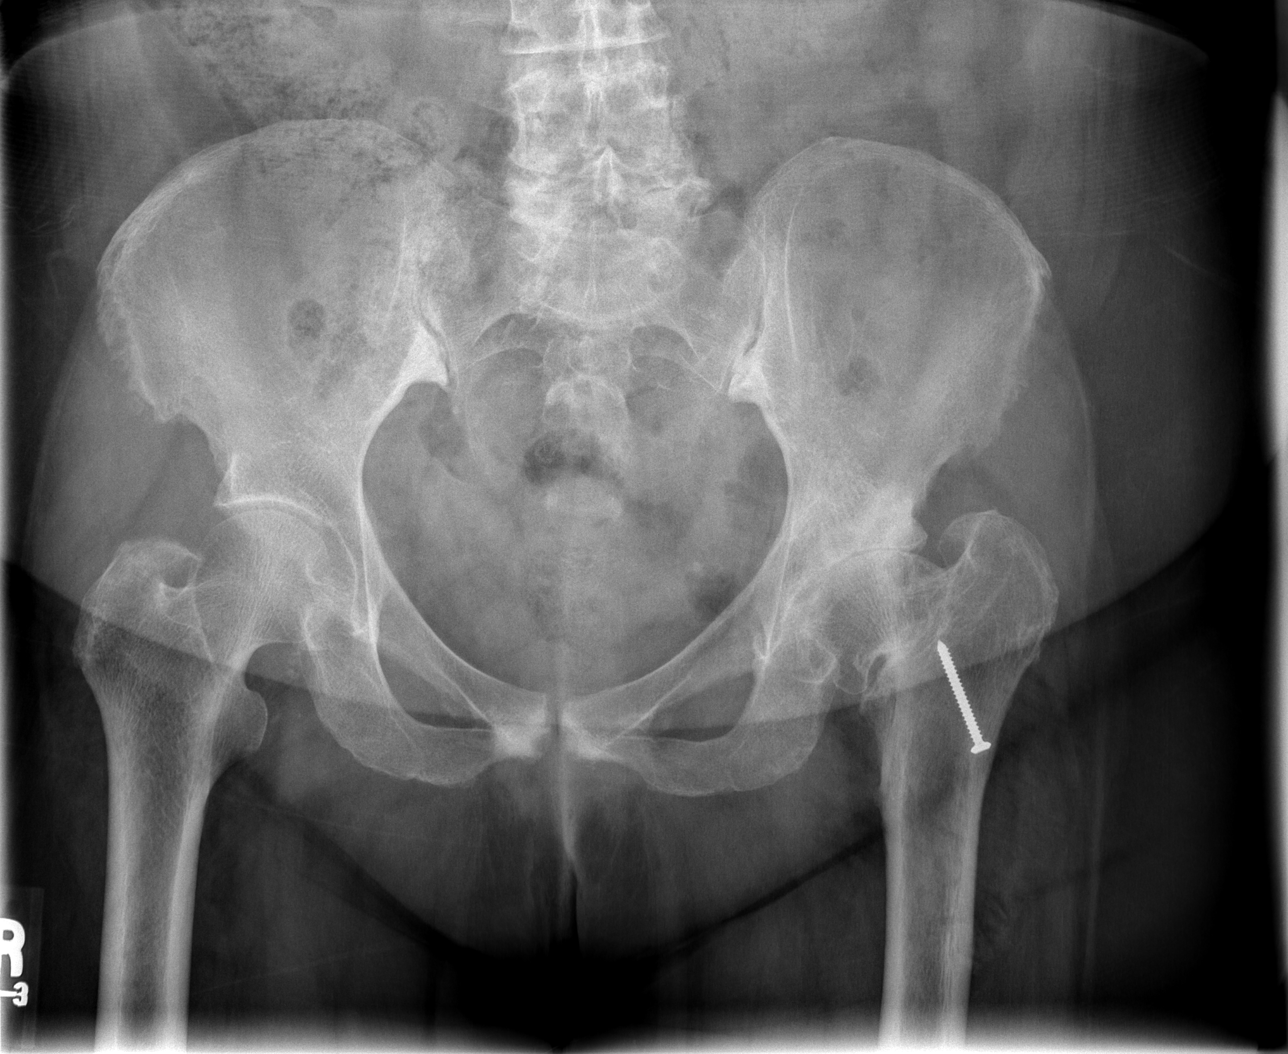

[t hip ap right]
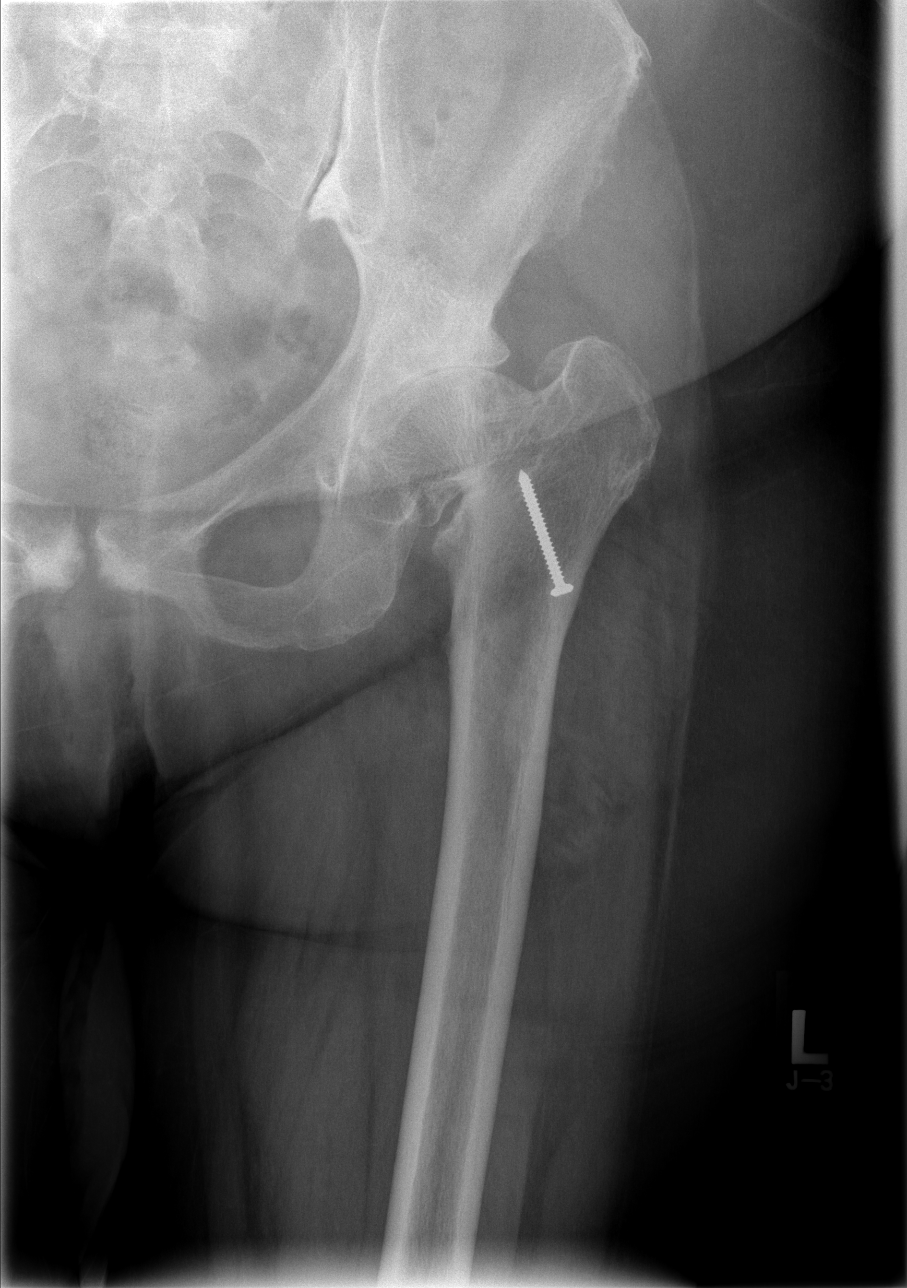

[t hip frog leg right]
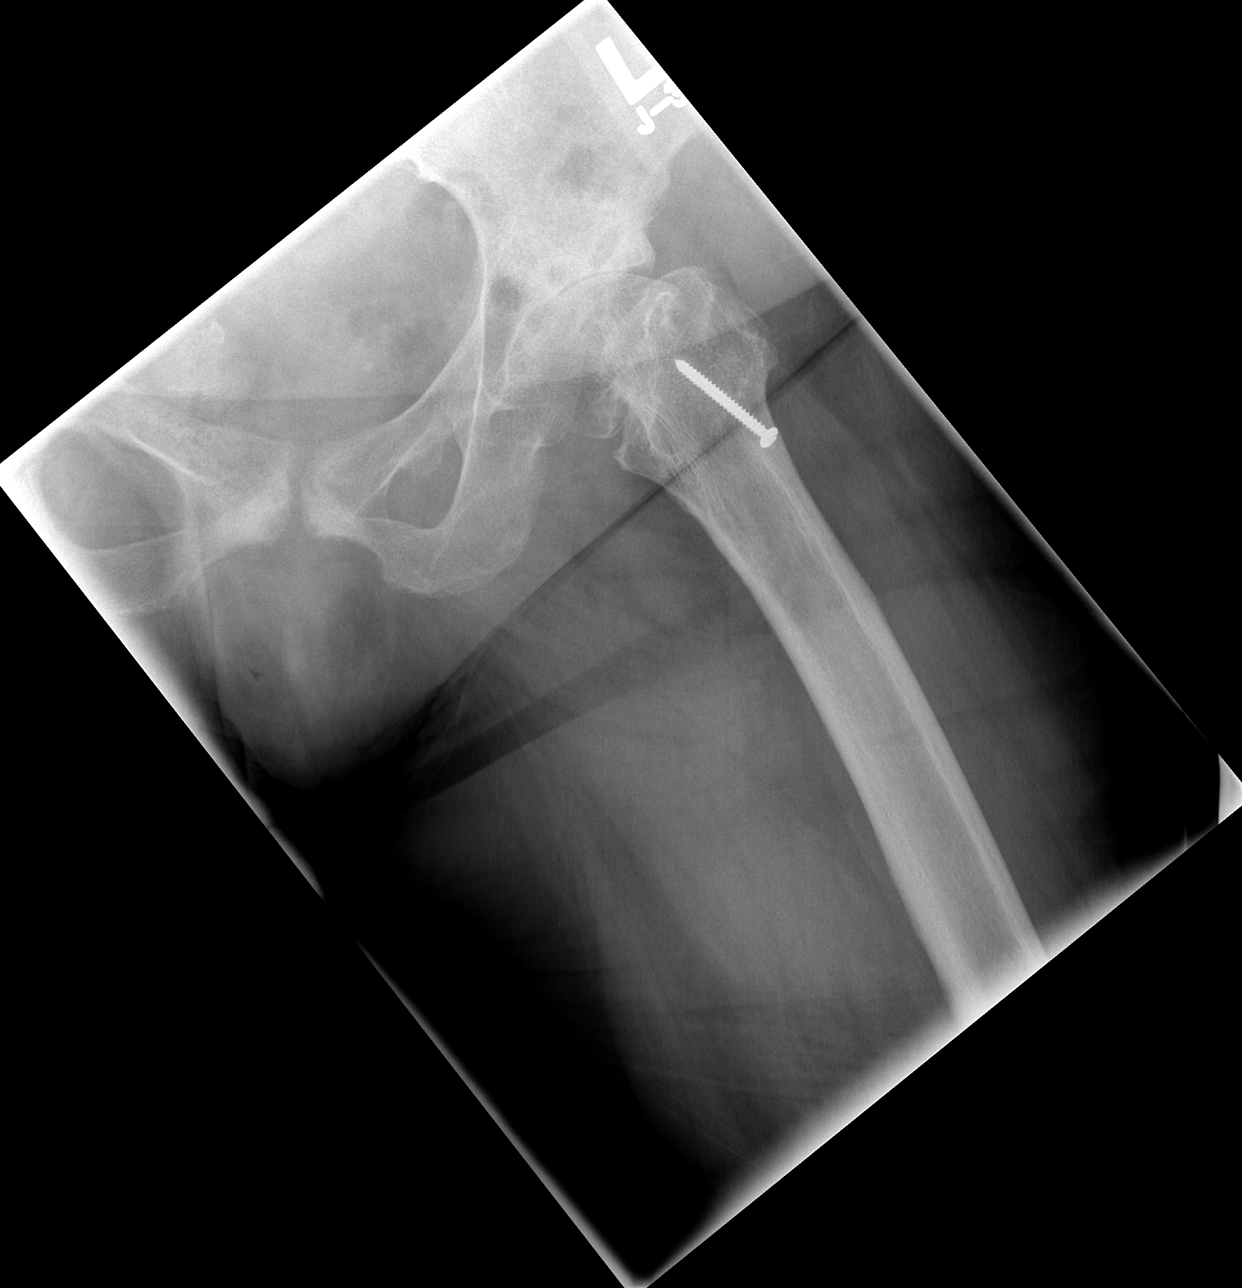

[3 of 3 positions shown; findings below may reference images not displayed]

FINDINGS: Post-traumatic deformity and foreshortening of the left
femur is again seen with a single screw in the intertrochanteric
region.  There is marked loss of joint space in the left hip, with
subchondral sclerosis and cyst formation.  Degenerative changes
have progressed from [DATE].  Right hip is unremarkable.
Degenerative changes are also seen at the symphysis pubis.
IMPRESSION: Post traumatic deformity and foreshortening of the left femur, with
worsening secondary osteoarthritic changes in the left hip.

## 2008-12-01 ENCOUNTER — Encounter: Admission: RE | Admit: 2008-12-01 | Discharge: 2008-12-01 | Payer: Self-pay | Admitting: Orthopedic Surgery

## 2009-01-27 IMAGING — CR DG HIP (WITH OR WITHOUT PELVIS) 2-3V*L*
3 series · 3 of 3 positions shown · non-contrast
Comparison: [DATE]

CLINICAL DATA: Preoperative evaluation

LEFT HIP - COMPLETE 2+ VIEW

[t pelvis a.p.]
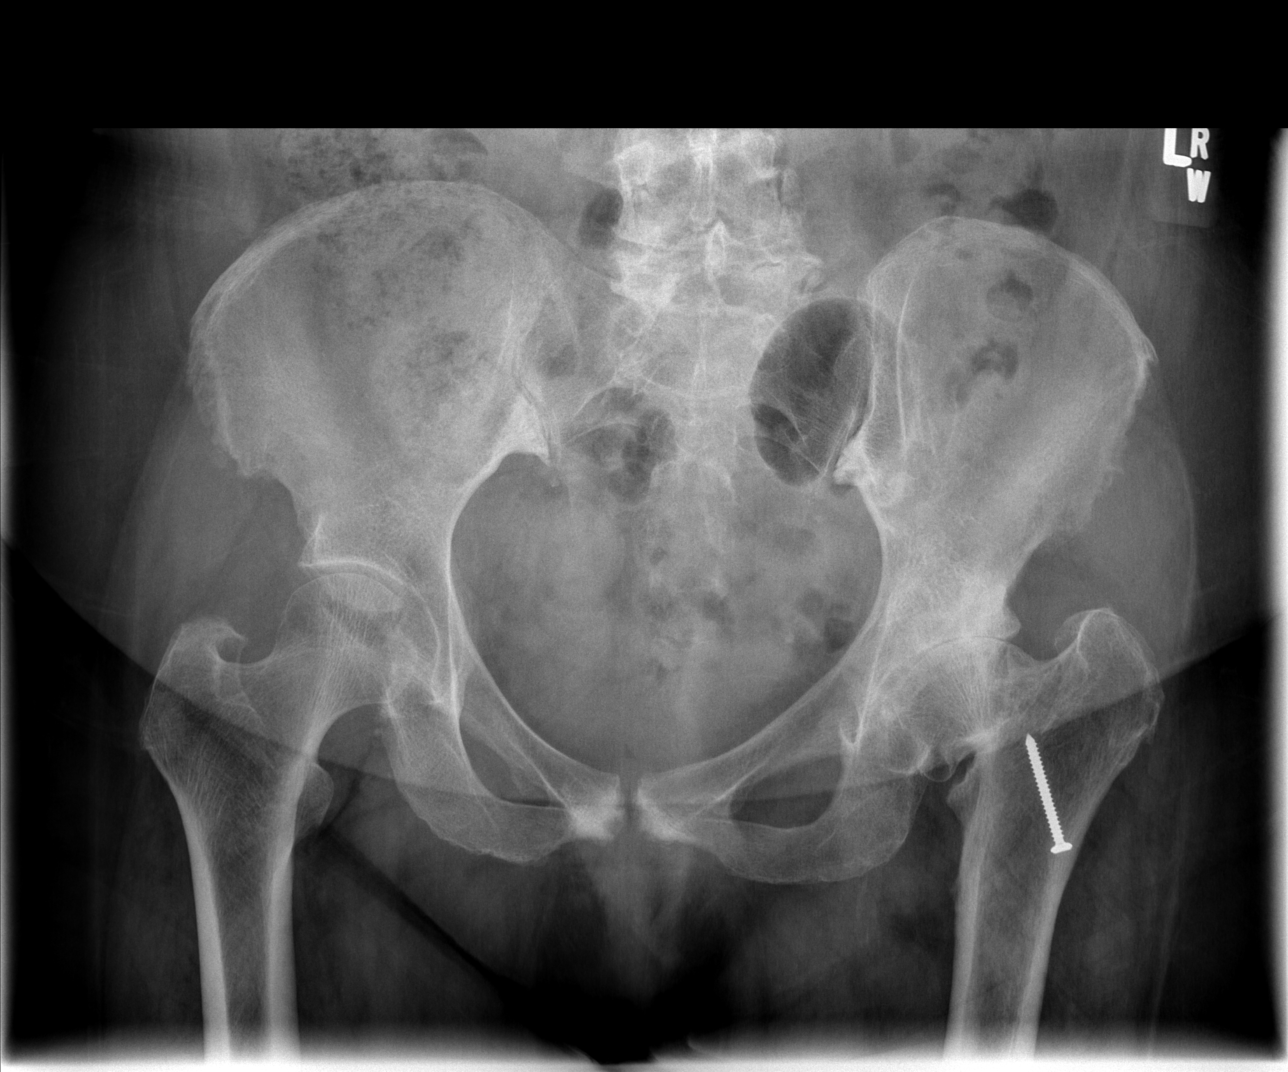

[t hip ap left]
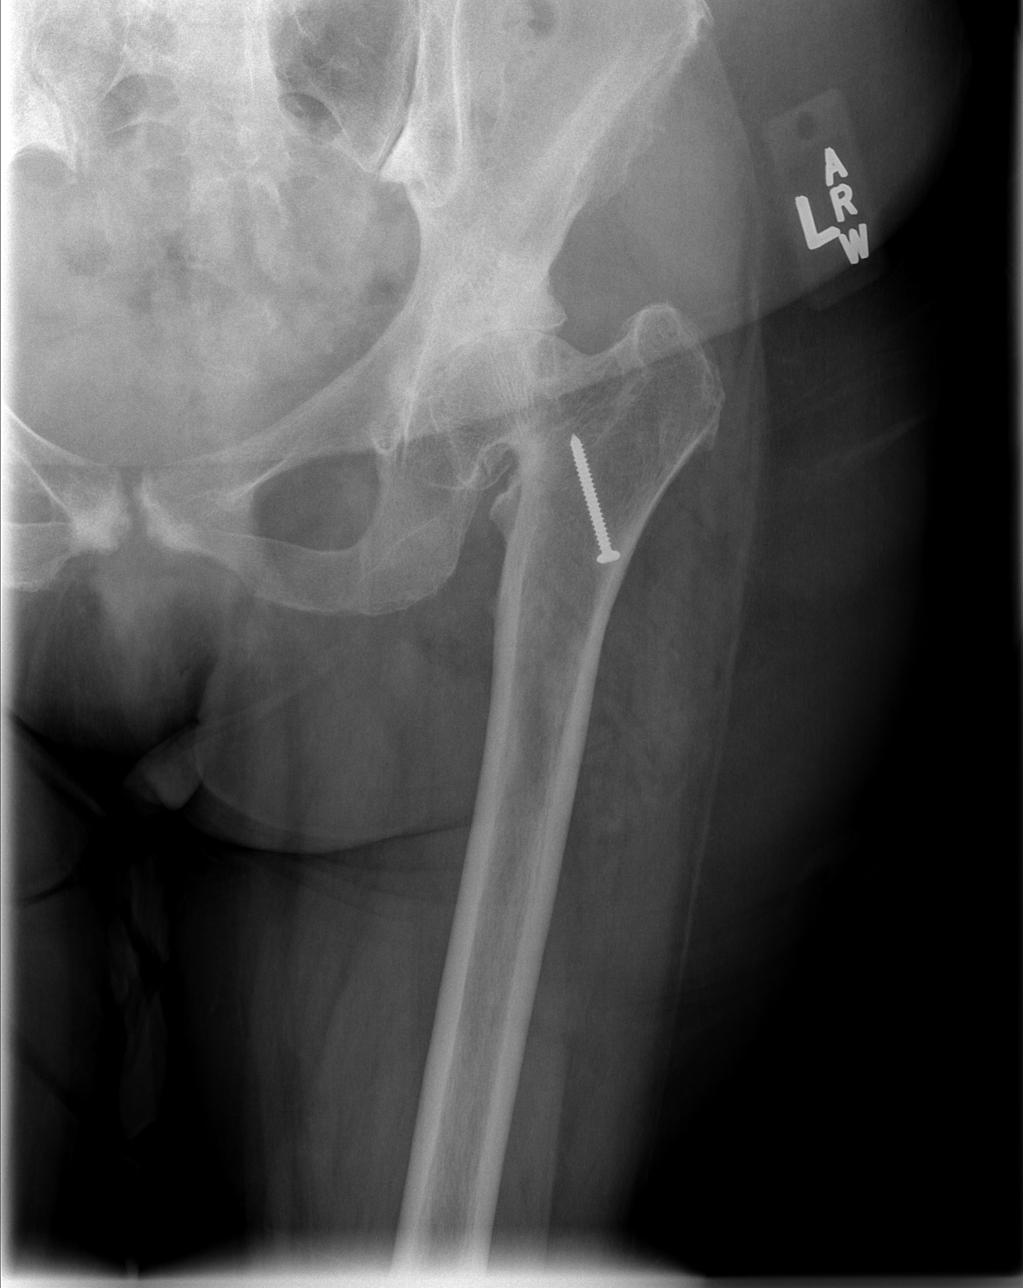

[t hip frog leg left]
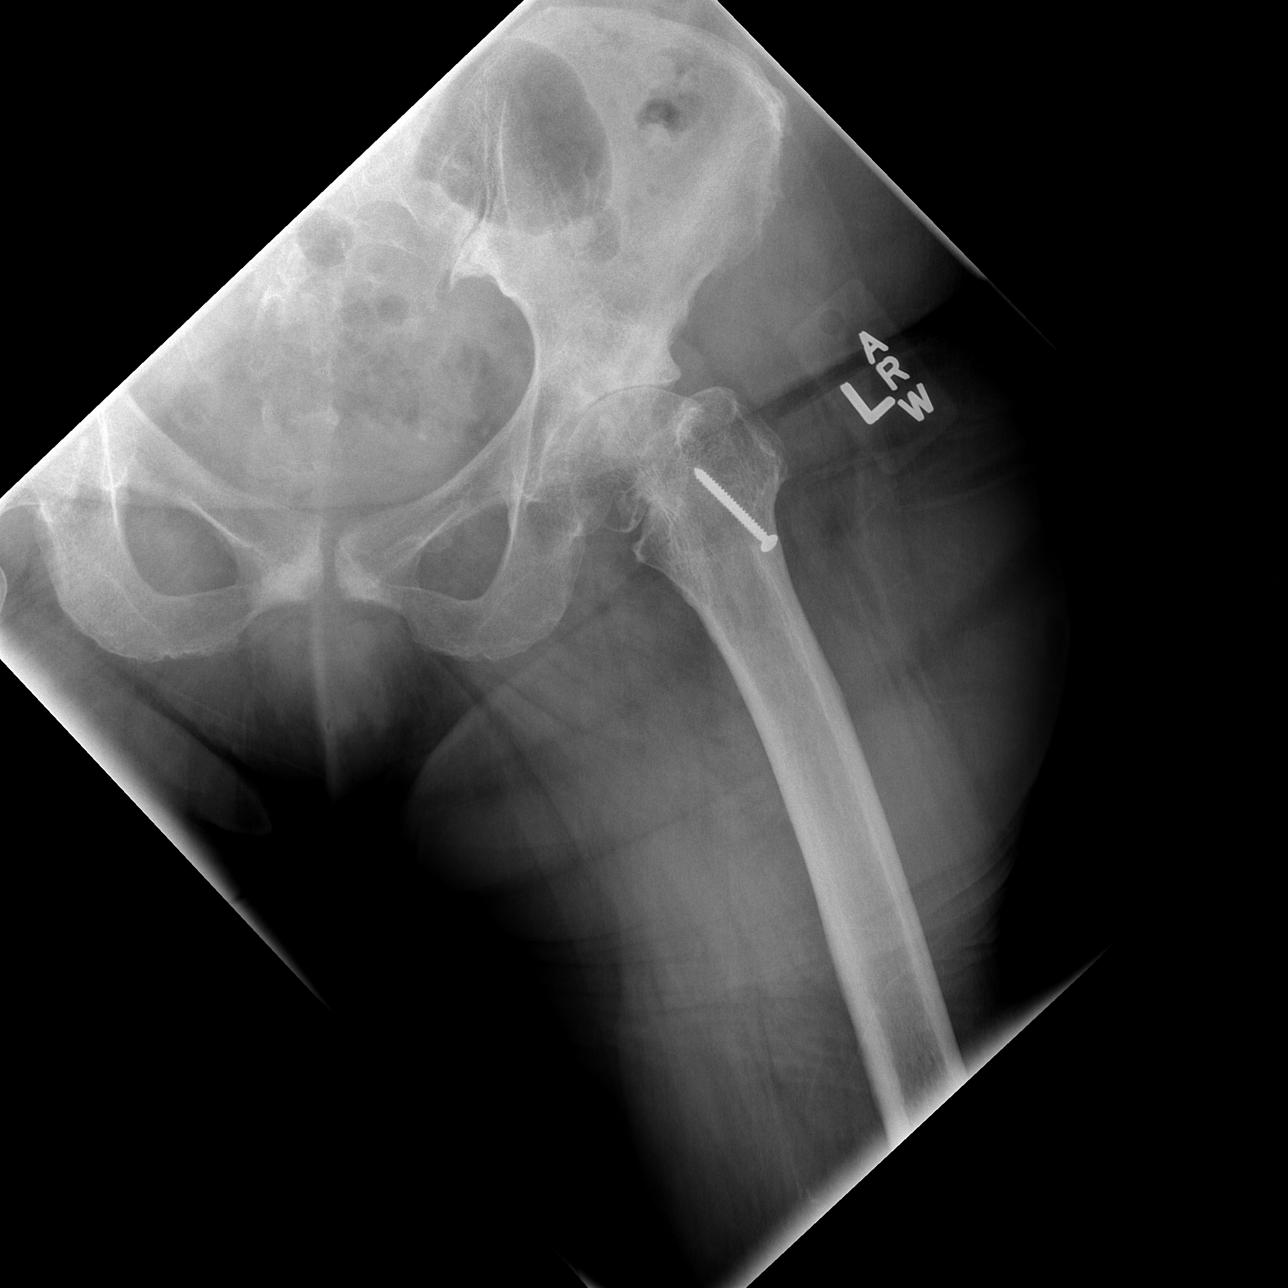

[3 of 3 positions shown; findings below may reference images not displayed]

FINDINGS: Post-traumatic deformity left hip with advanced secondary
degenerative changes redemonstrated.  Extensive subchondral
sclerosis within the acetabulum.  No appreciable interval change.
Degenerative change at the pubic symphysis and SI joints
redemonstrated.
IMPRESSION: Advanced degenerative changes left hip, stable in appearance.

## 2009-01-31 ENCOUNTER — Inpatient Hospital Stay (HOSPITAL_COMMUNITY): Admission: RE | Admit: 2009-01-31 | Discharge: 2009-02-04 | Payer: Self-pay | Admitting: Orthopedic Surgery

## 2009-01-31 IMAGING — CR DG PORTABLE PELVIS
1 series · 1 of 1 positions shown · non-contrast
Comparison: Preoperative films of [DATE]

CLINICAL DATA: Postop left total hip replacement

PORTABLE PELVIS

[view not recorded]
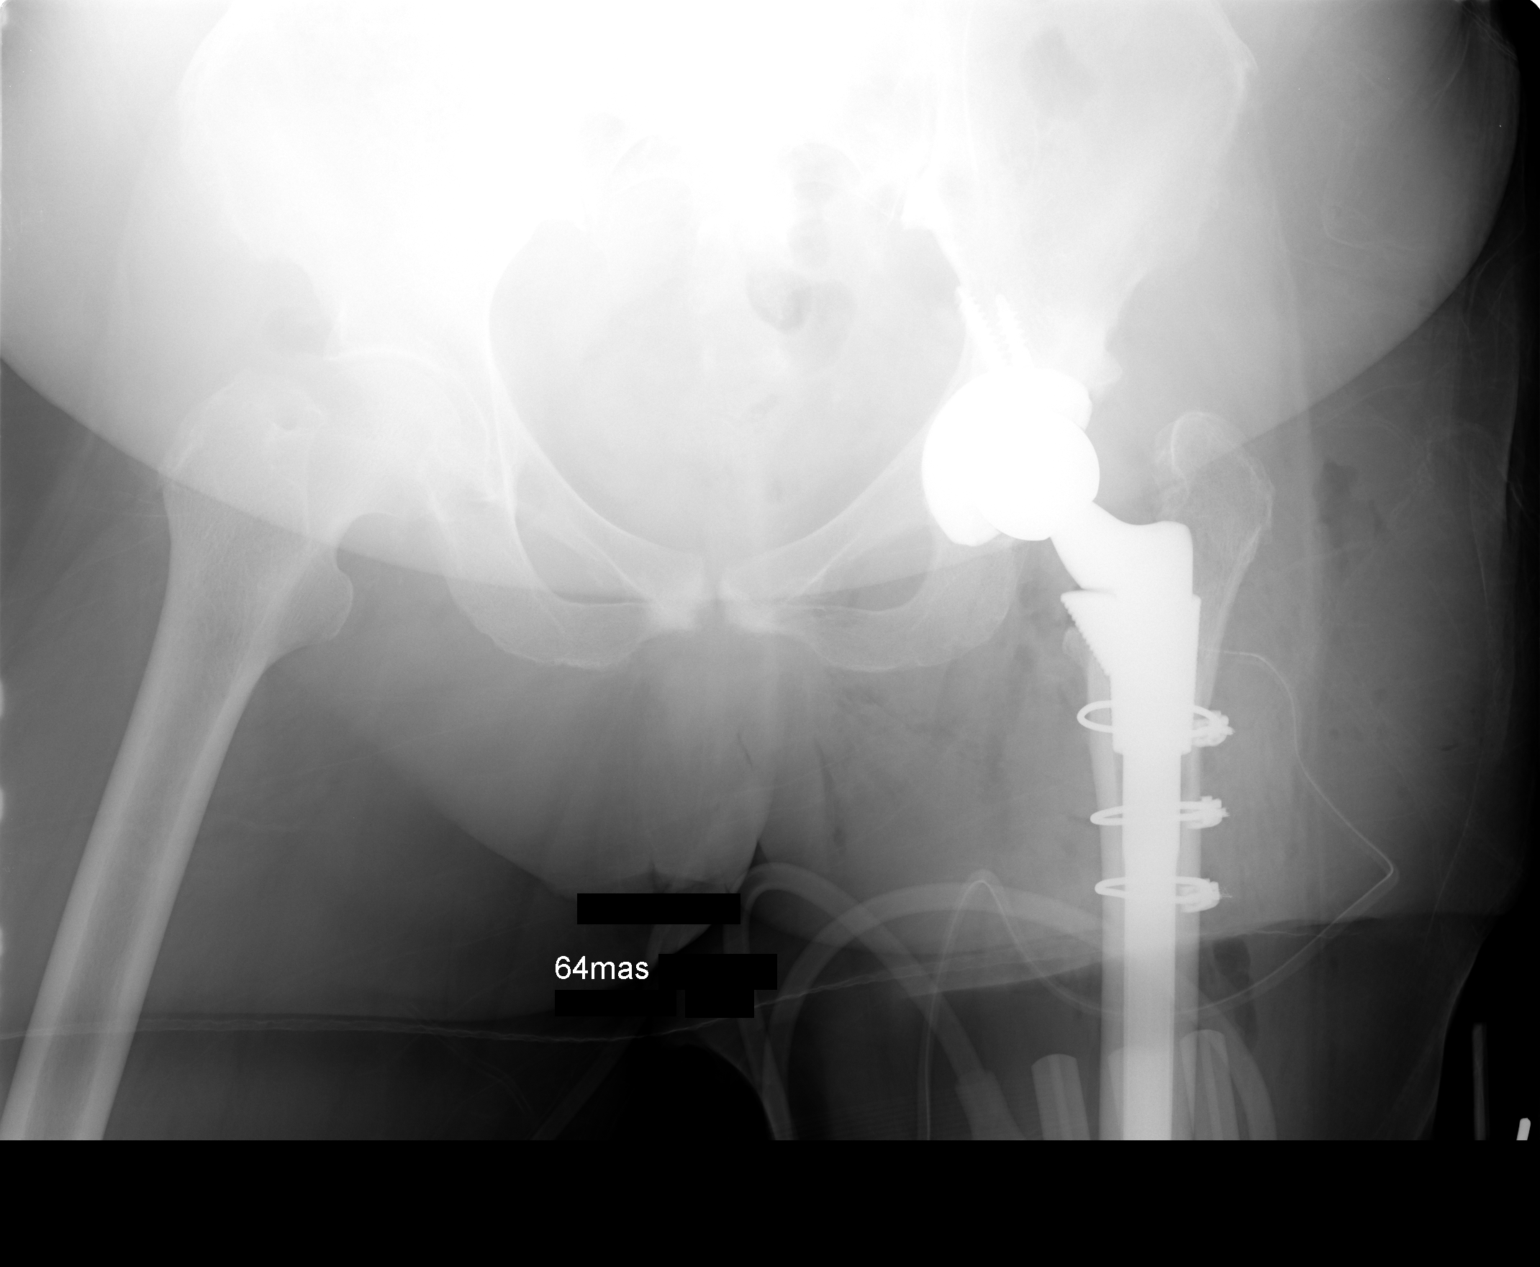

[1 of 1 positions shown; findings below may reference images not displayed]

FINDINGS: Portable view the pelvis shows good position of left
total hip replacement.  The femoral and acetabular components are
well aligned.  No acute bony abnormality is seen.
IMPRESSION: Left total hip replacement in good position on portable view.

## 2009-01-31 IMAGING — CR DG HIP 1V PORT*L*
1 series · 1 of 1 positions shown · non-contrast
Comparison: Preoperative films of [DATE]

CLINICAL DATA: Postop left total hip replacement

PORTABLE LEFT HIP - 1 VIEW

[view not recorded]
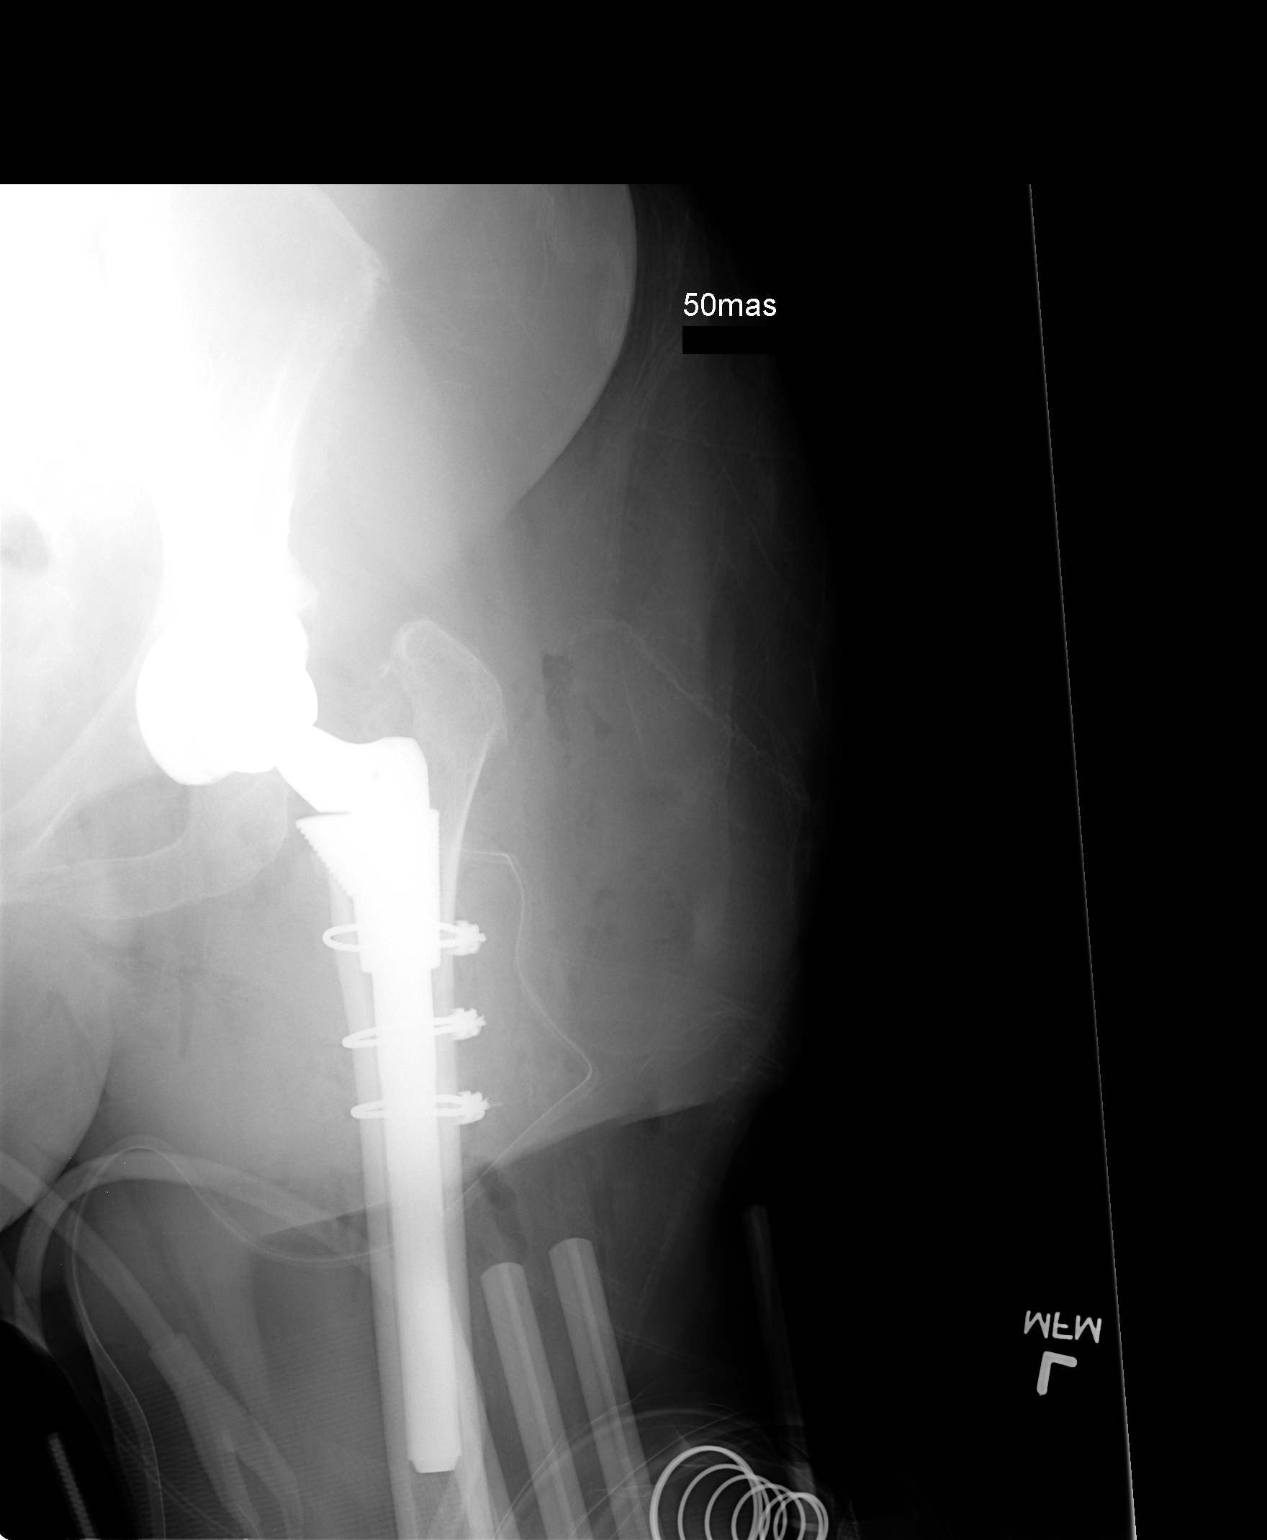

[1 of 1 positions shown; findings below may reference images not displayed]

FINDINGS: Portable view shows a left total hip replacement are in
good position.  No acute bony abnormality is seen.
IMPRESSION: Left total hip replacement in good position on portable view.

## 2009-02-02 ENCOUNTER — Ambulatory Visit: Payer: Self-pay | Admitting: Physical Medicine & Rehabilitation

## 2009-03-22 ENCOUNTER — Ambulatory Visit: Payer: Self-pay | Admitting: Surgery

## 2009-03-22 ENCOUNTER — Ambulatory Visit: Admission: RE | Admit: 2009-03-22 | Discharge: 2009-03-22 | Payer: Self-pay | Admitting: Family Medicine

## 2009-05-18 ENCOUNTER — Encounter: Admission: RE | Admit: 2009-05-18 | Discharge: 2009-05-18 | Payer: Self-pay | Admitting: Family Medicine

## 2009-05-18 IMAGING — MG MM SCREEN MAMMOGRAM BILATERAL
5 series · 5 of 5 positions shown · non-contrast
Comparison: Prior studies.

DG SCREEN MAMMOGRAM BILATERAL
Bilateral CC and MLO view(s) were taken.
Technologist: MAURO(MAURO)
Prior study comparison: [DATE], DG screen mammogram bilateral.

DIGITAL SCREENING MAMMOGRAM WITH CAD:

[R CC]
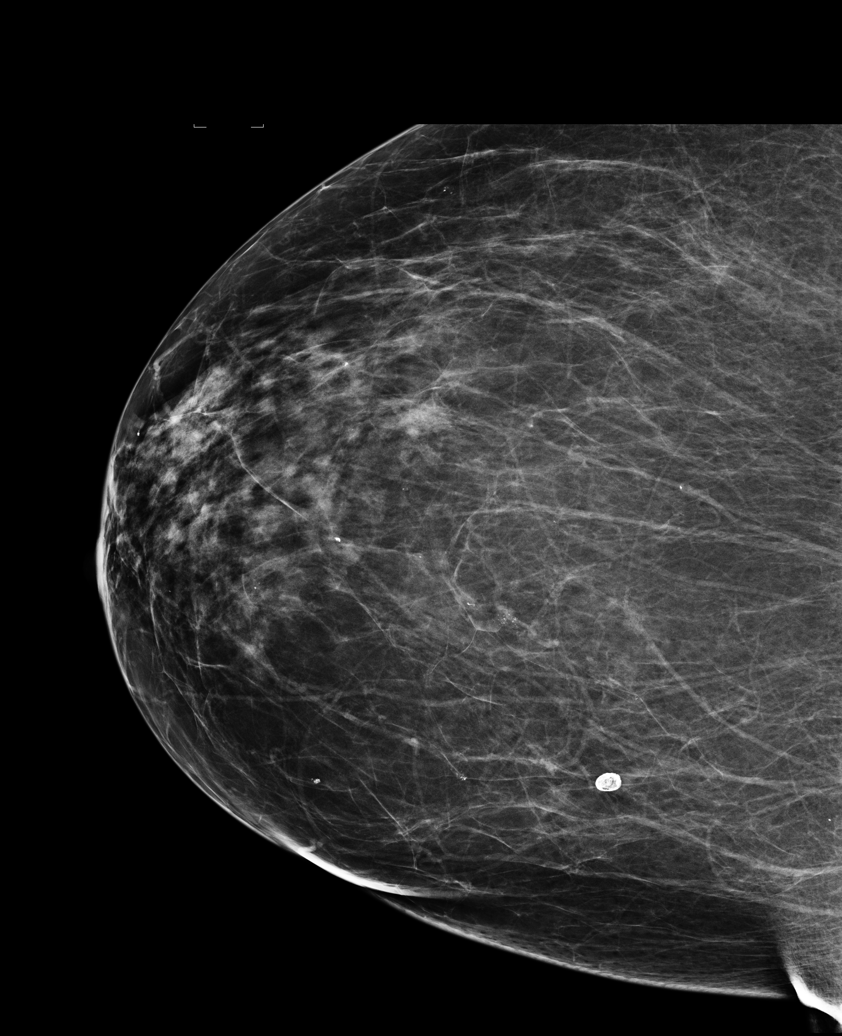

[L CC]
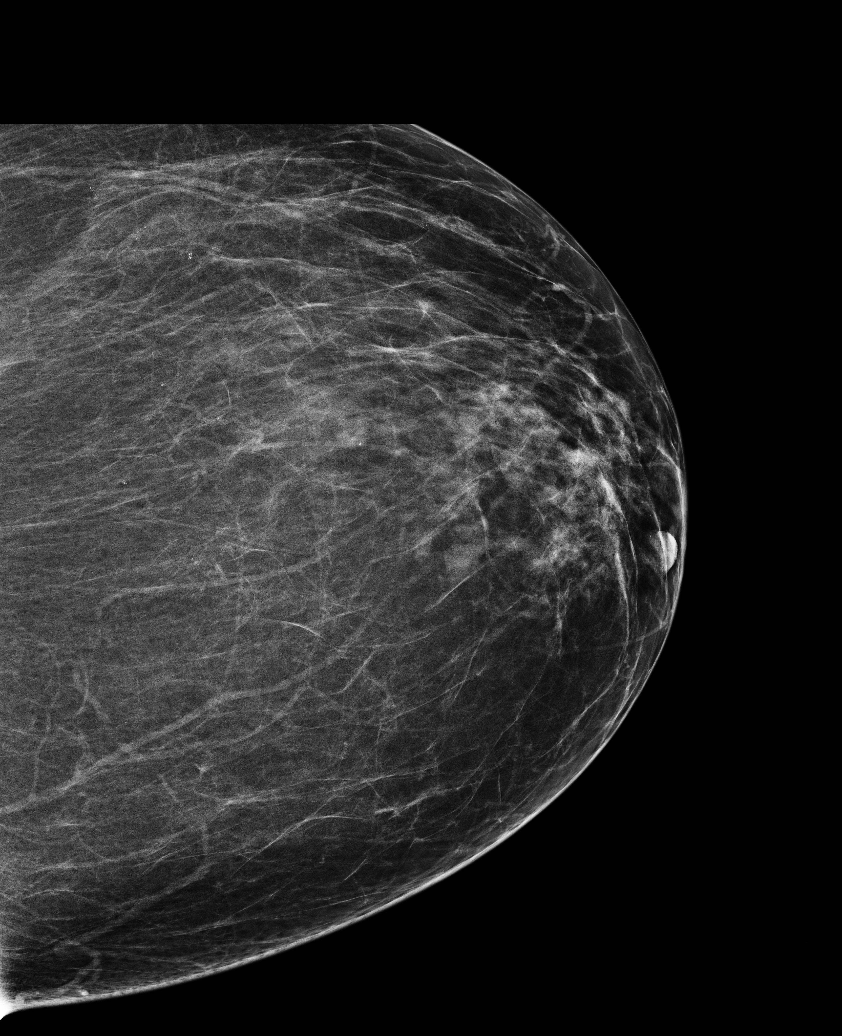

[L MLO (1 of 2)]
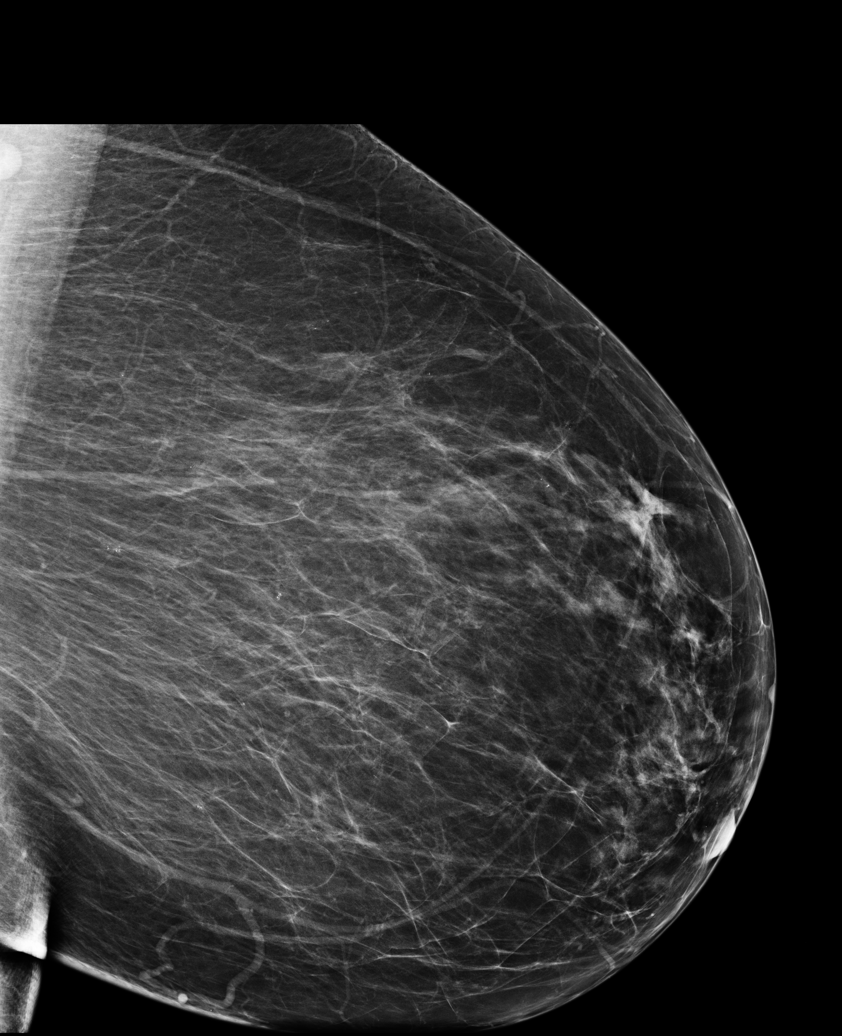

[R MLO]
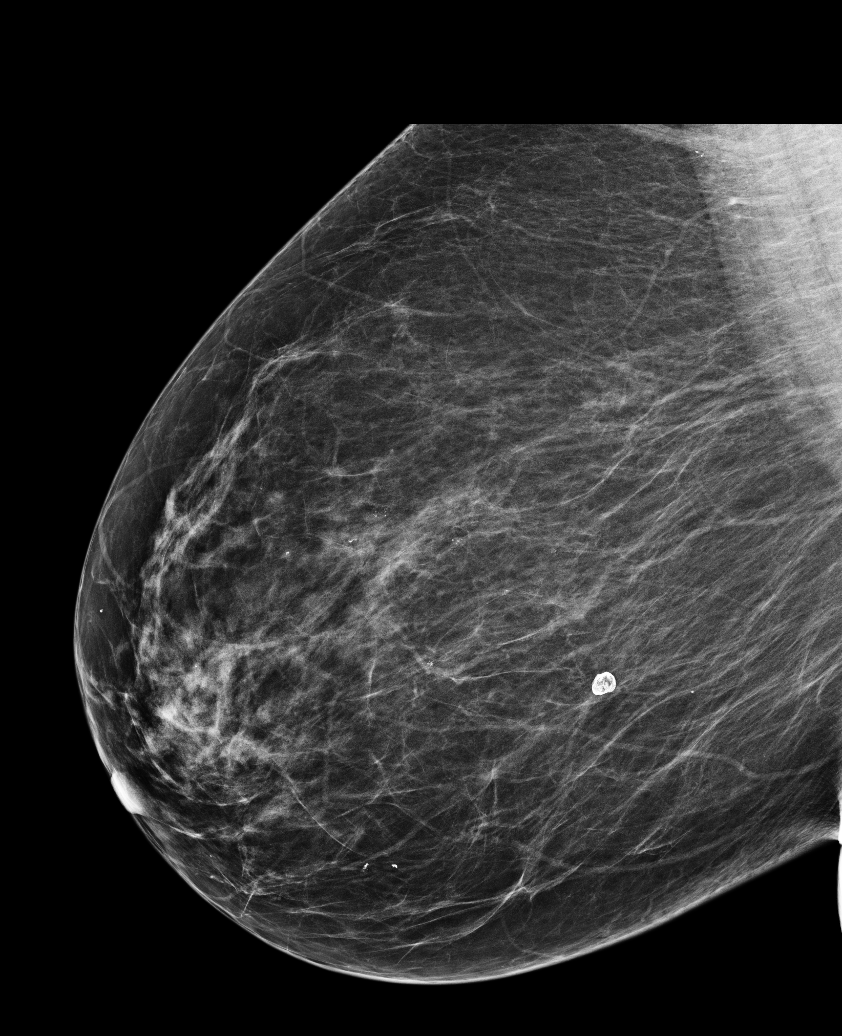

[L MLO (2 of 2)]
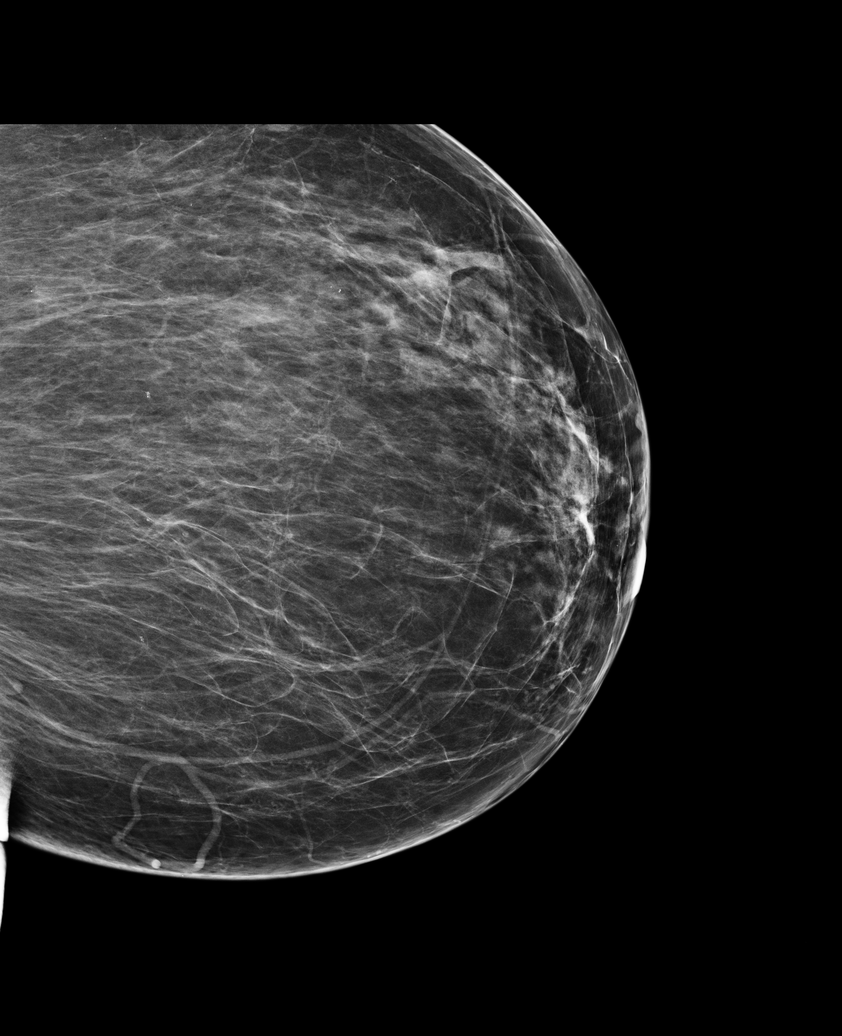

[5 of 5 positions shown; findings below may reference images not displayed]

The breast tissue is almost entirely fatty.  There is no dominant mass, architectural distortion or
calcification to suggest malignancy.

Images were processed with CAD.
IMPRESSION: No mammographic evidence of malignancy.  Suggest yearly screening mammography.

A result letter of this screening mammogram will be mailed directly to the patient.

ASSESSMENT: Negative - BI-RADS 1

Screening mammogram in 1 year.
,

## 2009-06-29 IMAGING — CR DG HAND 2V*R*
2 series · 2 of 2 positions shown · non-contrast
Comparison: None

CLINICAL DATA: Pain in the fourth finger for 1 month.  No injury.

RIGHT HAND - 2 VIEW

[x hand ap right]
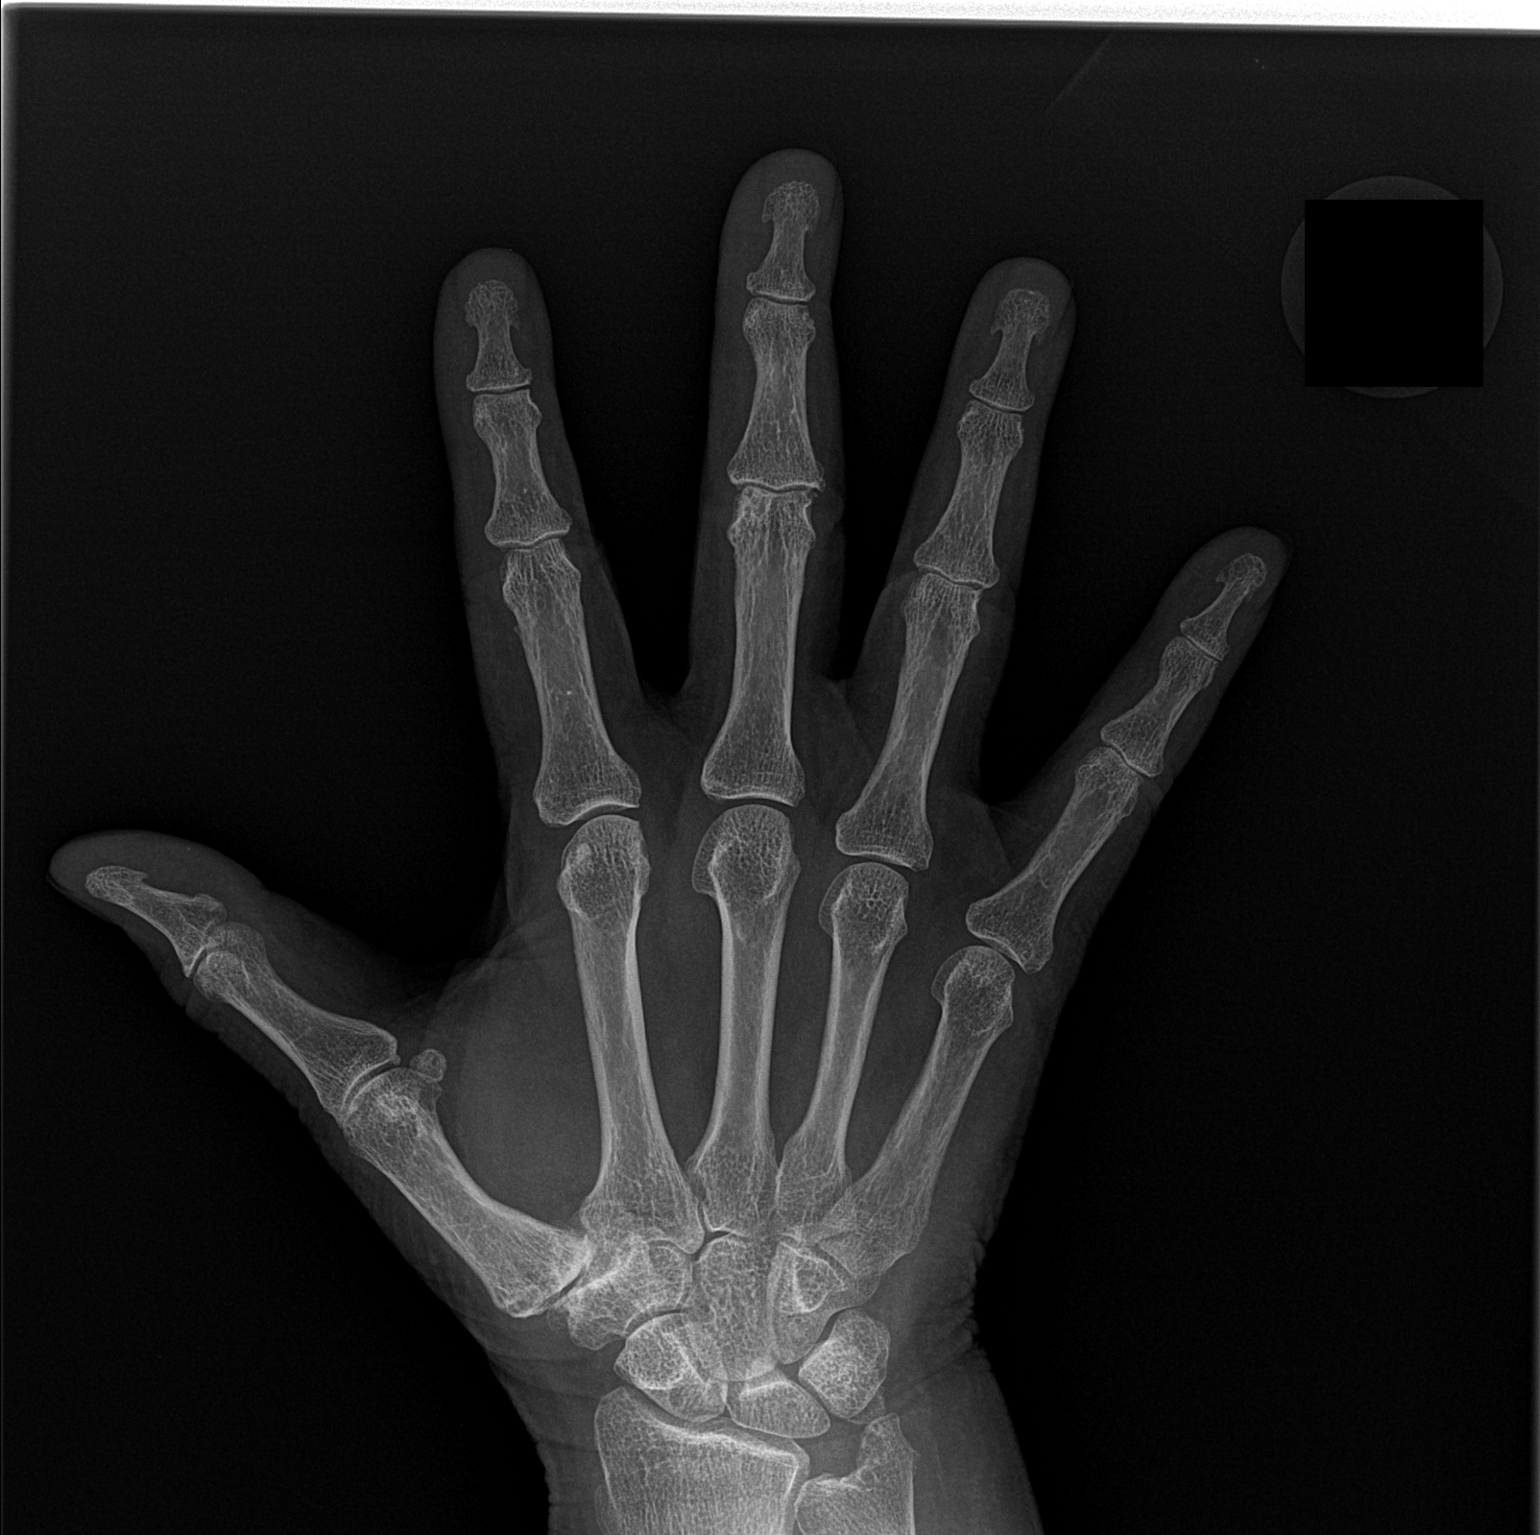

[x hand lat right]
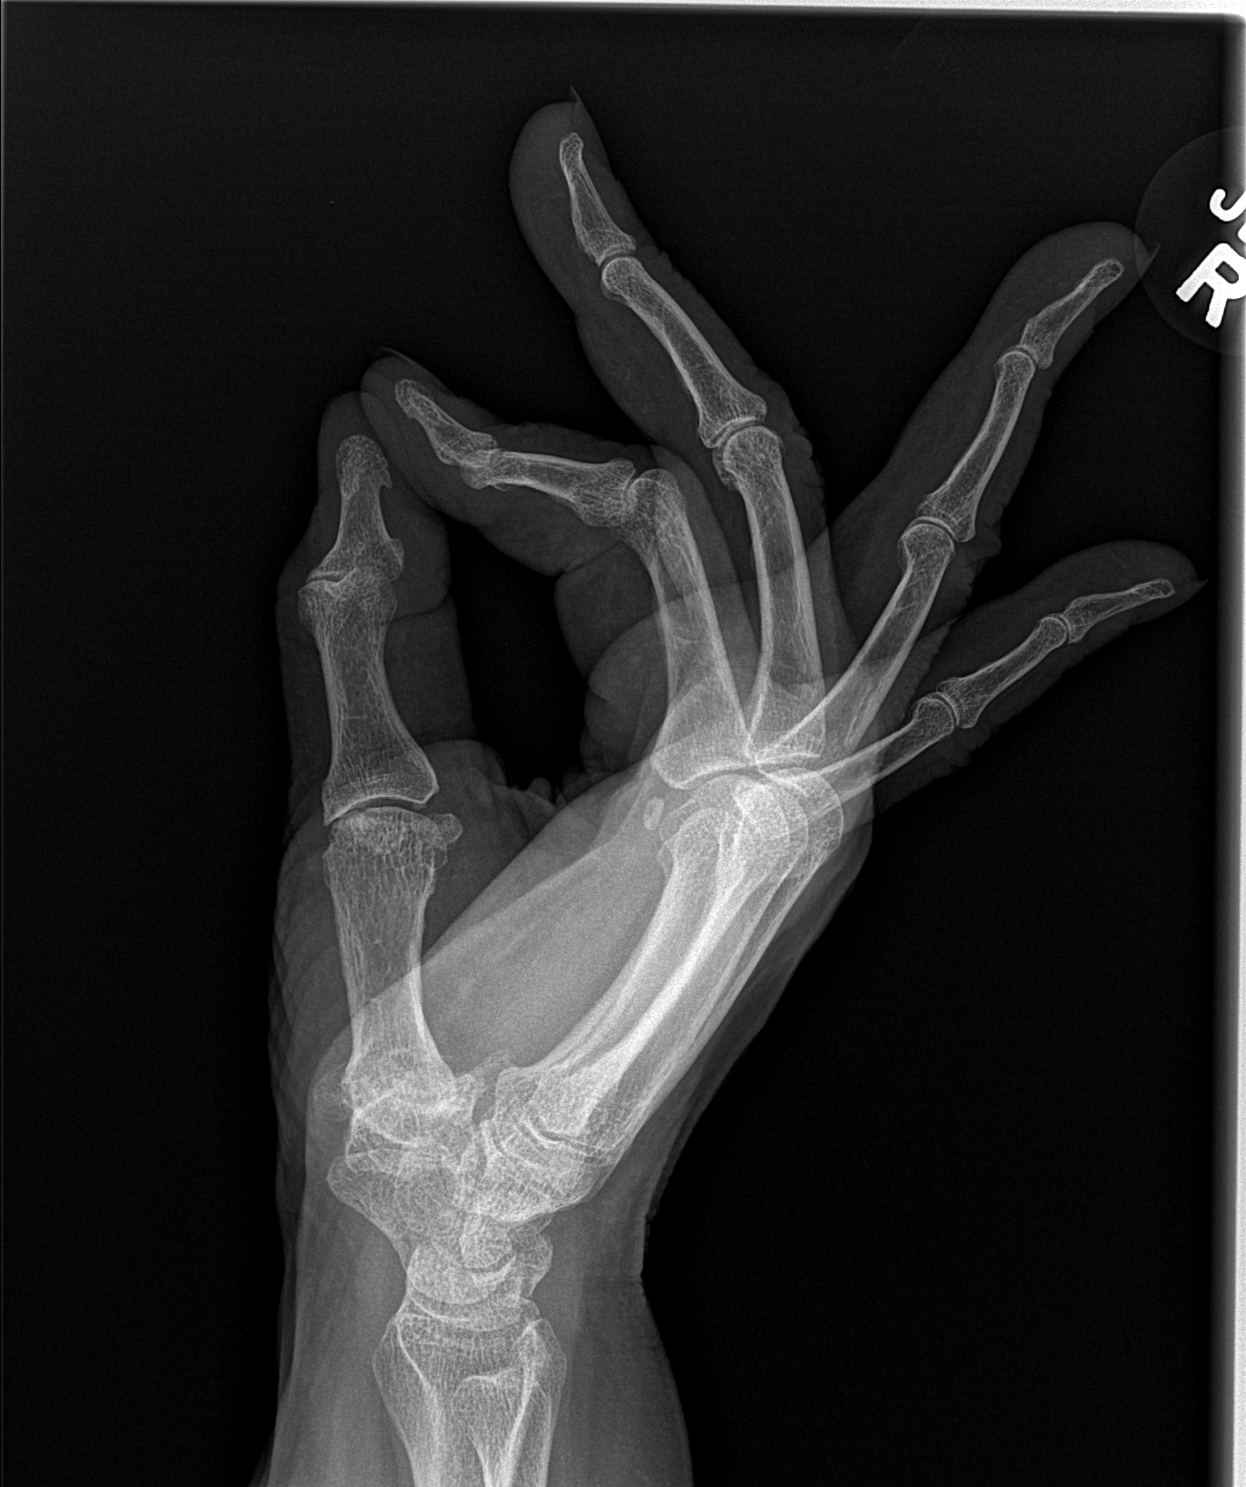

[2 of 2 positions shown; findings below may reference images not displayed]

FINDINGS: Mild degenerative changes of the proximal interphalangeal
joints of the third through fifth digits.  No acute fracture or
dislocation.   Mild osteopenia suspected
IMPRESSION: Mild osteoarthritis without acute finding.

## 2009-12-21 ENCOUNTER — Encounter: Admission: RE | Admit: 2009-12-21 | Discharge: 2009-12-21 | Payer: Self-pay | Admitting: Family Medicine

## 2010-05-01 ENCOUNTER — Ambulatory Visit (HOSPITAL_COMMUNITY): Admission: RE | Admit: 2010-05-01 | Discharge: 2010-05-01 | Payer: Self-pay | Admitting: Gastroenterology

## 2010-05-18 ENCOUNTER — Ambulatory Visit (HOSPITAL_COMMUNITY): Admission: RE | Admit: 2010-05-18 | Discharge: 2010-05-18 | Payer: Self-pay | Admitting: Family Medicine

## 2010-05-31 ENCOUNTER — Encounter: Admission: RE | Admit: 2010-05-31 | Discharge: 2010-05-31 | Payer: Self-pay | Admitting: Family Medicine

## 2010-05-31 IMAGING — MG MM DIGITAL SCREENING
6 series · 6 of 6 positions shown · non-contrast
Comparison: Prior studies.

DG SCREEN MAMMOGRAM BILATERAL
Bilateral CC and MLO view(s) were taken.
Prior study comparison: [DATE], DG screen mammogram bilateral.

DIGITAL SCREENING MAMMOGRAM WITH CAD:

[R CC]
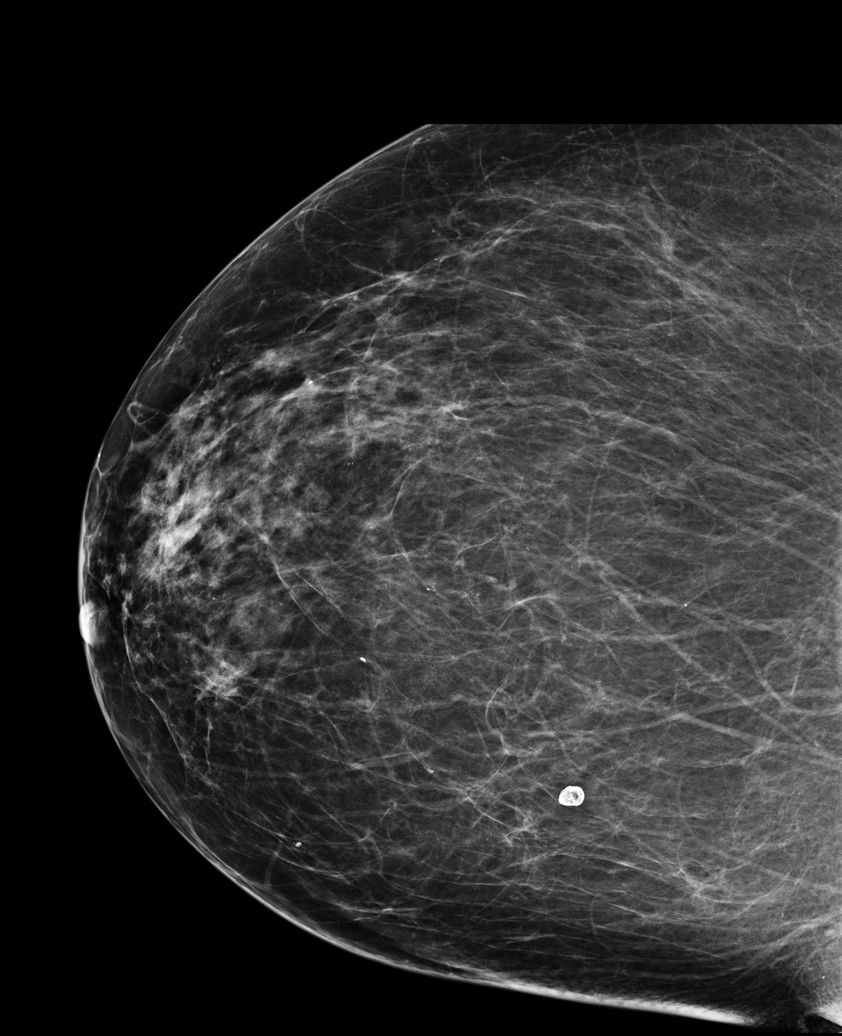

[L CC]
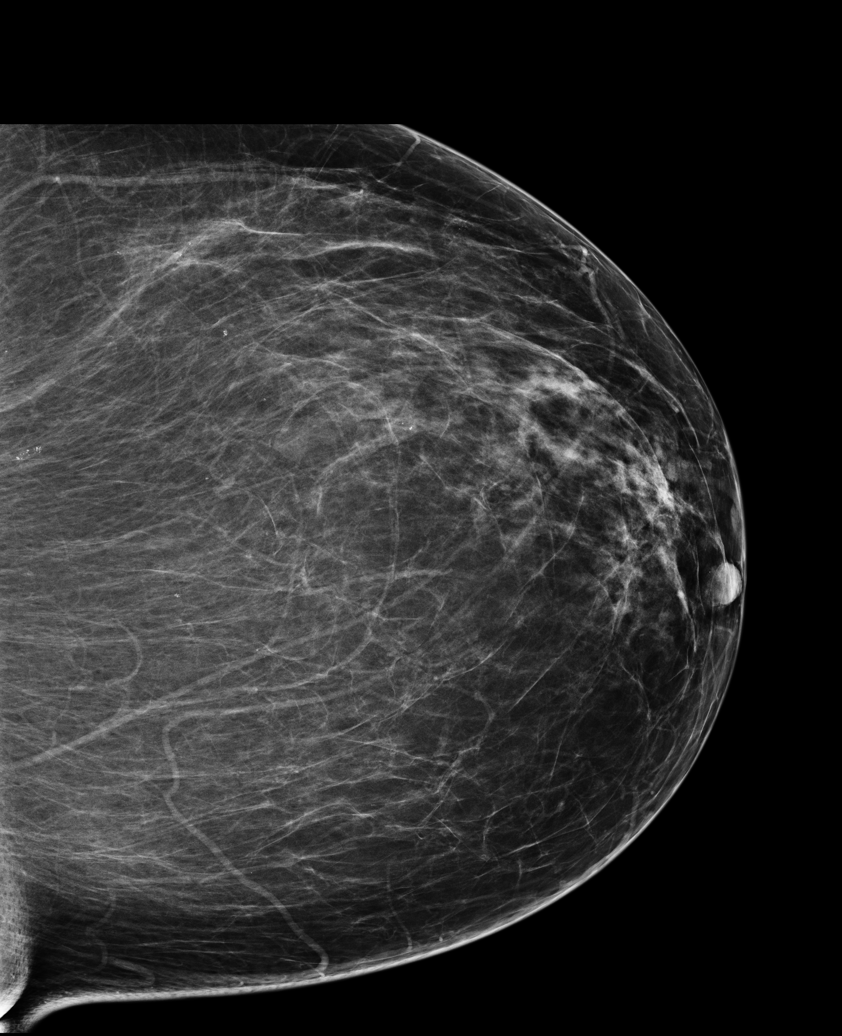

[L MLO (1 of 2)]
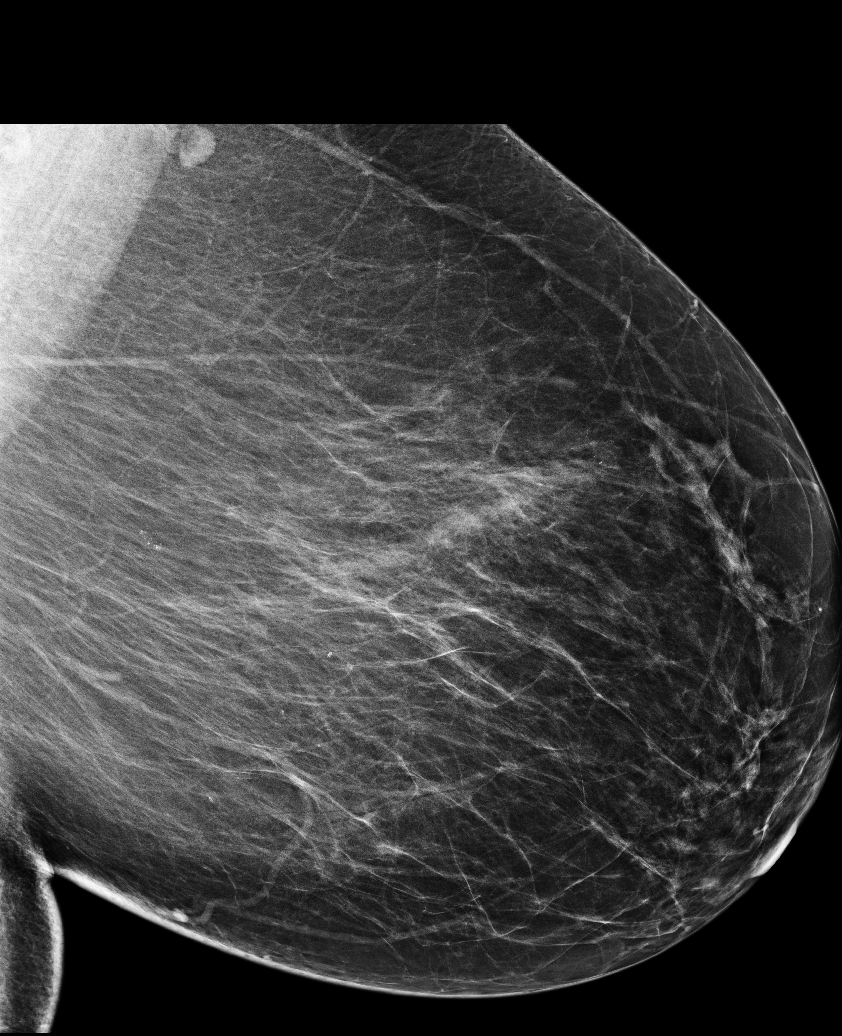

[R MLO (1 of 2)]
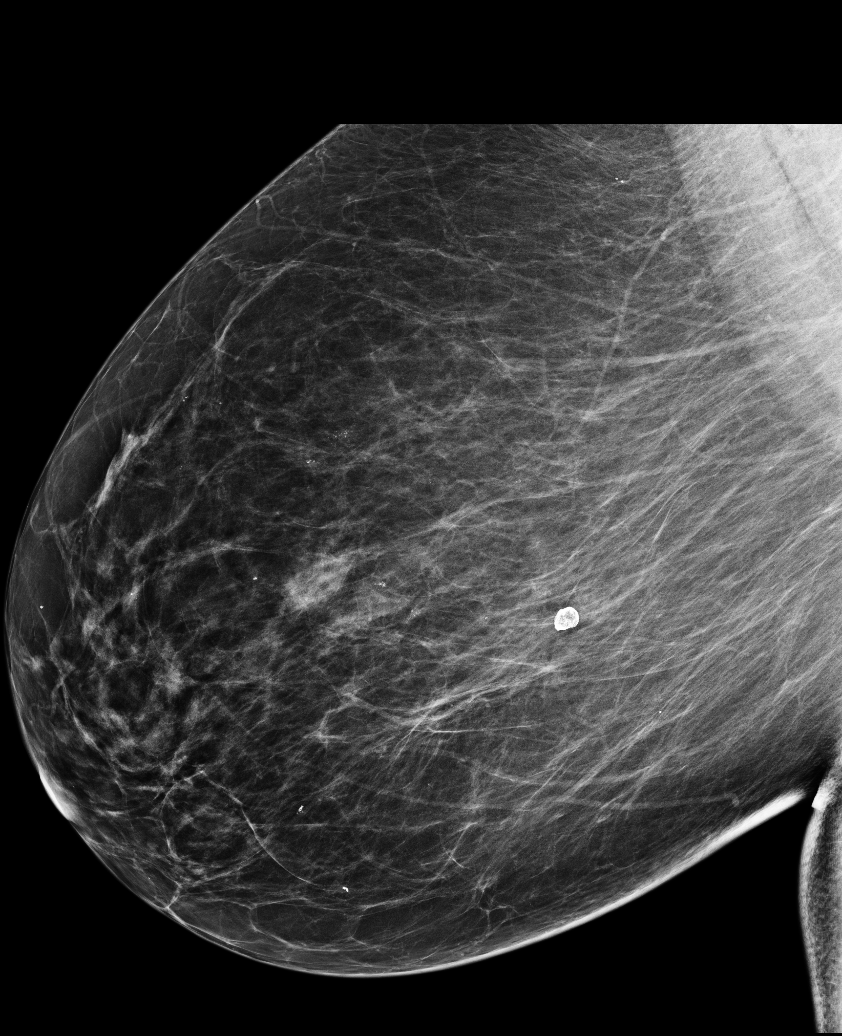

[L MLO (2 of 2)]
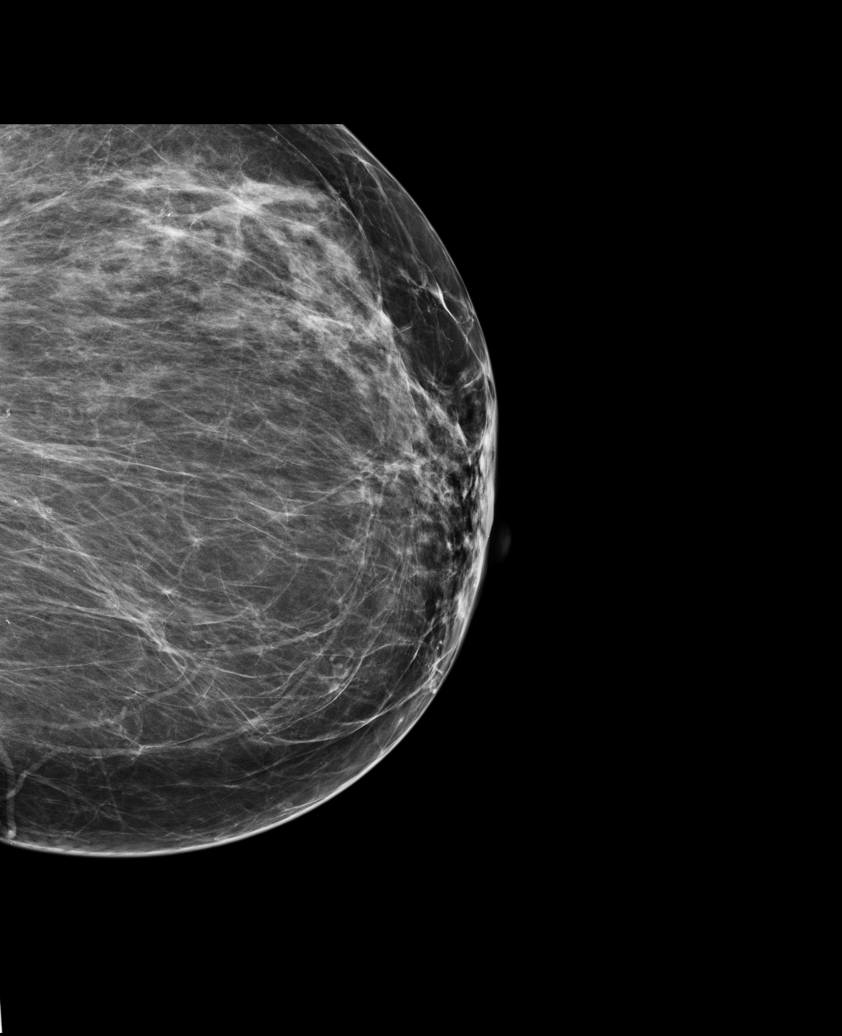

[R MLO (2 of 2)]
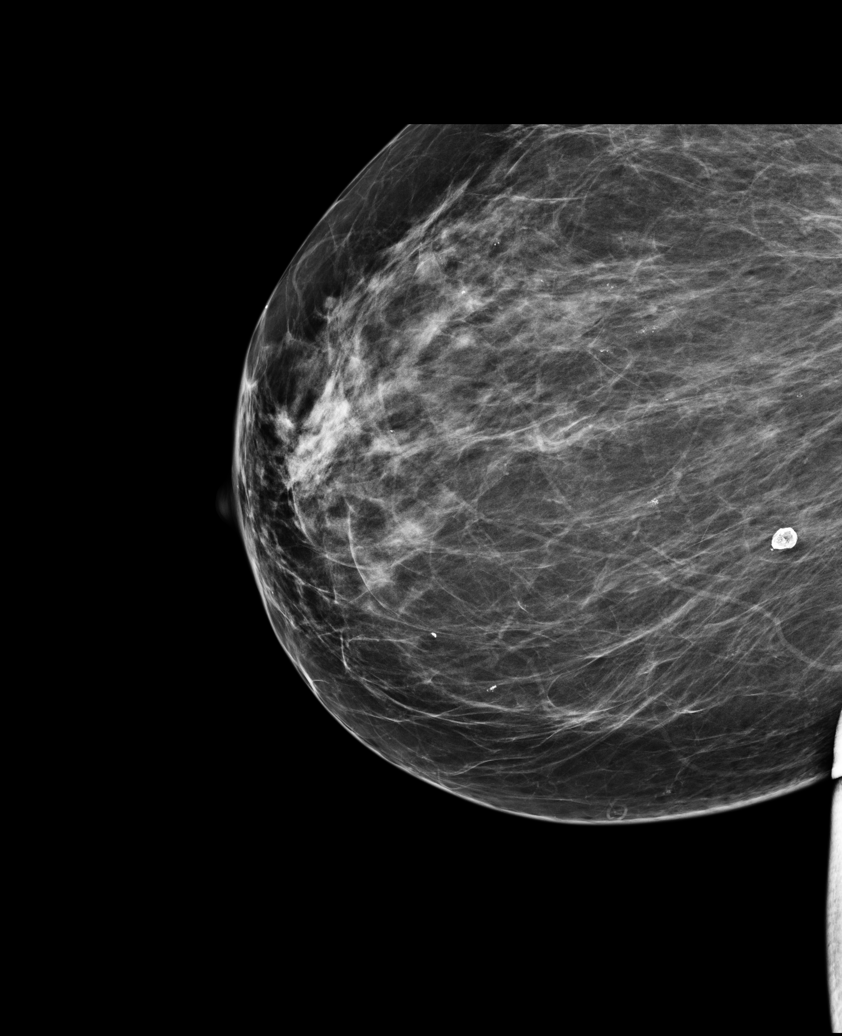

[6 of 6 positions shown; findings below may reference images not displayed]

The breast tissue is almost entirely fatty.  There is no dominant mass, architectural distortion or
calcification to suggest malignancy.

Images were processed with CAD.
IMPRESSION: No mammographic evidence of malignancy.  Suggest yearly screening mammography.

A result letter of this screening mammogram will be mailed directly to the patient.

ASSESSMENT: Negative - BI-RADS 1

Screening mammogram in 1 year.
,

## 2010-11-20 LAB — COMPREHENSIVE METABOLIC PANEL
ALT: 17 U/L (ref 0–35)
Alkaline Phosphatase: 48 U/L (ref 39–117)
BUN: 15 mg/dL (ref 6–23)
CO2: 29 mEq/L (ref 19–32)
Chloride: 103 mEq/L (ref 96–112)
Creatinine, Ser: 0.75 mg/dL (ref 0.4–1.2)
GFR calc Af Amer: >60 mL/min (ref >60)
Potassium: 3.5 mEq/L (ref 3.5–5.1)
Sodium: 139 mEq/L (ref 135–145)
Total Protein: 7.5 g/dL (ref 6.0–8.3)

## 2010-11-20 LAB — CBC
HCT: 22.5 % — ABNORMAL LOW (ref 36.0–46.0)
HCT: 28.4 % — ABNORMAL LOW (ref 36.0–46.0)
HCT: 32.7 % — ABNORMAL LOW (ref 36.0–46.0)
HCT: 38.7 % (ref 36.0–46.0)
Hemoglobin: 7.8 g/dL — CL (ref 12.0–15.0)
Hemoglobin: 8.3 g/dL — ABNORMAL LOW (ref 12.0–15.0)
MCHC: 33.8 g/dL (ref 30.0–36.0)
MCV: 89.6 fL (ref 78.0–100.0)
MCV: 90.1 fL (ref 78.0–100.0)
MCV: 90.7 fL (ref 78.0–100.0)
Platelets: 145 10*3/uL — ABNORMAL LOW (ref 150–400)
Platelets: 150 10*3/uL (ref 150–400)
Platelets: 153 10*3/uL (ref 150–400)
Platelets: 166 10*3/uL (ref 150–400)
RBC: 2.49 MIL/uL — ABNORMAL LOW (ref 3.87–5.11)
RBC: 3.65 MIL/uL — ABNORMAL LOW (ref 3.87–5.11)
RDW: 13.7 % (ref 11.5–15.5)
RDW: 13.7 % (ref 11.5–15.5)
RDW: 13.8 % (ref 11.5–15.5)
RDW: 14 % (ref 11.5–15.5)
RDW: 14.1 % (ref 11.5–15.5)
WBC: 10.8 10*3/uL — ABNORMAL HIGH (ref 4.0–10.5)
WBC: 12.7 10*3/uL — ABNORMAL HIGH (ref 4.0–10.5)
WBC: 14.3 10*3/uL — ABNORMAL HIGH (ref 4.0–10.5)
WBC: 8.1 10*3/uL (ref 4.0–10.5)

## 2010-11-20 LAB — BASIC METABOLIC PANEL
BUN: 9 mg/dL (ref 6–23)
BUN: 9 mg/dL (ref 6–23)
CO2: 30 mEq/L (ref 19–32)
Calcium: 8.4 mg/dL (ref 8.4–10.5)
Chloride: 103 mEq/L (ref 96–112)
Chloride: 107 mEq/L (ref 96–112)
Creatinine, Ser: 0.79 mg/dL (ref 0.4–1.2)
Creatinine, Ser: 0.81 mg/dL (ref 0.4–1.2)
GFR calc Af Amer: >60 mL/min (ref >60)
GFR calc non Af Amer: >60 mL/min (ref >60)
GFR calc non Af Amer: >60 mL/min (ref >60)
GFR calc non Af Amer: >60 mL/min (ref >60)
Glucose, Bld: 110 mg/dL — ABNORMAL HIGH (ref 70–99)
Glucose, Bld: 147 mg/dL — ABNORMAL HIGH (ref 70–99)
Potassium: 3.8 mEq/L (ref 3.5–5.1)
Sodium: 138 mEq/L (ref 135–145)

## 2010-11-20 LAB — PROTIME-INR
INR: 1.5 (ref 0.00–1.49)
INR: 2.3 — ABNORMAL HIGH (ref 0.00–1.49)
Prothrombin Time: 13 seconds (ref 11.6–15.2)
Prothrombin Time: 14.5 seconds (ref 11.6–15.2)
Prothrombin Time: 18.3 seconds — ABNORMAL HIGH (ref 11.6–15.2)
Prothrombin Time: 24.4 seconds — ABNORMAL HIGH (ref 11.6–15.2)

## 2010-11-20 LAB — TYPE AND SCREEN
ABO/RH(D): O POS
Antibody Screen: NEGATIVE

## 2010-11-20 LAB — APTT: aPTT: 29 seconds (ref 24–37)

## 2010-11-20 LAB — PREPARE RBC (CROSSMATCH)

## 2010-11-20 LAB — URINALYSIS, ROUTINE W REFLEX MICROSCOPIC
Bilirubin Urine: NEGATIVE
Specific Gravity, Urine: 1.022 (ref 1.005–1.030)

## 2010-12-26 NOTE — H&P (Signed)
NAMESINEAD, HOCKMAN                 ACCOUNT NO.:  [id number]   MEDICAL RECORD NO.:  [id number]          PATIENT TYPE:  INP   LOCATION:  NA                           FACILITY:  [facility]   PHYSICIAN:  Ollen Gross, M.D.    DATE OF BIRTH:  10/29/1932   DATE OF ADMISSION:  01/31/2009  DATE OF DISCHARGE:                              HISTORY & PHYSICAL   CHIEF COMPLAINT:  Left hip pain.   HISTORY PRESENT ILLNESS:  The patient is a 77 year old female who has  been seen by Dr. Lequita Halt for ongoing worsening pain in involving the  left hip has been ongoing for quite some time now.  She has been seen in  the office by Dr. Lequita Halt and x-rays show severe end-stage arthritis.  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  She is  short on that leg due to the deformity.  It is felt she would benefit  undergoing surgical intervention.  Risks and benefits been discussed,  elects to proceed with surgery.   ALLERGIES:  PENICILLIN, LIPITOR, AVELOX, CLINDAMYCIN, PREDNISONE,  SIMVASTATIN.   CURRENT MEDICATIONS:  Aspirin, vitamin D, Centrum Silver,  hydrochlorothiazide.   PAST MEDICAL HISTORY:  Bronchitis.  Past history of pneumonia.  Past  history of shingles, hypertension, osteoporosis, history of colonic  polyps, thyroid cyst and hypercholesterolemia.   PAST SURGICAL HISTORY:  She had a of a left hip surgery.   FAMILY HISTORY:  Father deceased at age 3 with heart failure, heart  disease.  Mother deceased age 36 cancer and asthma.  One brother  deceased with diabetes.  Two sisters living.  One sisters has asthma.   SOCIAL HISTORY:  Not smoker at this time but a previous history of  smoking.  No alcohol.  Married.  No children.   REVIEW OF SYSTEMS:  GENERAL:  No fevers, chills, night sweats.  NEURO:  No seizures or paralysis.  RESPIRATORY:  No shortness breath, productive cough or hemoptysis.  CARDIOVASCULAR:  No chest pain.  GI: No nausea, diarrhea, constipation.  GU:  No  dysuria or discharge.  MUSCULOSKELETAL:  Left hip.   PHYSICAL EXAM:  VITAL SIGNS:  Pulse 74, respiration 16, blood pressure  160/84.  GENERAL:  77 year old African American female well-nourished, well-  developed, short-statured, overweight.  She is alert and cooperative,  pleasant.  Good historian.  HEENT: Normocephalic, atraumatic.  Pupils are reactive.  Pharynx clear.  EOMs intact.  NECK:  Supple.  CHEST:  Clear.  HEART:  Regular rate and rhythm without murmur, S1 S2 noted.  ABDOMEN:  Soft, round protuberant and bowel sounds present.  RECTAL, BREASTS, GENITALIA:  Not done, not pertinent to present illness.  EXTREMITIES:  Left hip flexion 90-0, internal rotation 0, external  rotation 10 degrees abduction.   IMPRESSION:  Osteoarthritis left hip.   PLAN:  The patient will be admitted [facility] undergo a left  total replacement arthroplasty.  Surgery will be performed by Dr.  Lequita Halt.  The patient does want to look into a skilled nursing facility.  She is very interested in possibility  and looked into the [facility] first if available.      Alexzandrew L. Perkins, P.A.C.      Ollen Gross, M.D.  Electronically Signed    ALP/MEDQ  D:  01/30/2009  T:  01/30/2009  Job:  [id number]   cc:   Ollen Gross, M.D.  Fax: [address] PA-C  Fax: [phone]   Melida Quitter, M.D.  Fax: [phone]

## 2010-12-26 NOTE — Op Note (Signed)
[name], [name]                 ACCOUNT NO.:  [id number]   MEDICAL RECORD NO.:  [id number]          PATIENT TYPE:  INP   LOCATION:  1607                         FACILITY:  [facility]   PHYSICIAN:  Ollen Gross, M.D.    DATE OF BIRTH:  1933-08-13   DATE OF PROCEDURE:  01/31/2009  DATE OF DISCHARGE:                               OPERATIVE REPORT   PREOPERATIVE DIAGNOSIS:  Osteoarthritis, left hip.   POSTOPERATIVE DIAGNOSIS:  Osteoarthritis, left hip.   PROCEDURE:  Left total hip arthroplasty.   SURGEON:  Ollen Gross, M.D.   ASSISTANT:  Avel Peace, PA-C   ANESTHESIA:  General.   ESTIMATED BLOOD LOSS:  100 mL.   DRAINS:  Hemovac times one.   COMPLICATIONS:  Intraoperative periprosthetic proximal femur fracture  treated with cabling.   CONDITION:  Stable to recovery.   CLINICAL NOTE:  [name] is a 77 year old female with severe end-stage  arthritis of the left hip with progressively worsening pain and  dysfunction.  She has a retained screw her in her proximal femur from an  whole procedure.  She has severe arthritis and presents now for total  hip arthroplasty.   PROCEDURE IN DETAIL:  After successful administration of general  anesthetic, the patient is placed in the right lateral decubitus  position with the left side up and held with the hip positioner.  Left  lower extremity was isolated from her perineum with plastic drapes and  prepped and draped in the usual sterile fashion.  A standard  posterolateral incision was made with a 10 blade through subcutaneous  tissue to the level of fascia lata was incised in line with the skin  incision.  The sciatic nerve was palpated and protected and short  rotators and capsule isolated off the femur.  Hip was dislocated.  She  has a very foreshortened femoral neck and head is almost touching the  lesser trochanter.  The osteotomy line is marked on the femoral neck  based on the position of the trial and then we use an  oscillating saw to  remove the femoral head.  The screw is identified.  The screw head was  buried in the bone slightly below the lesser trochanter.  I decided then  to take it out retrograde by using an osteotome to chip away some of the  bone surrounding the screw.  Screw then easily came out.   Femur was then retracted anteriorly to gain acetabular exposure.  Labrum  and osteophytes are removed and then reaming starts at 45 mm.  We  coursed up in increments of 2 to 49 mm and a 50-mm pinnacle acetabular  shell is placed in anatomic position and transfixed with two dome screws  with excellent purchase.  The trial 32-mm neutral +4 liner was placed.   The femur was prepared with the canal finder and irrigation.  Axial  reaming is performed to 13.5 mm proximal reaming to an 18 D and the  sleeve machined to a small.  A 18 D small trial sleeve is placed with an  18 x 13 stem and  first a 36 +8 neck.  This was obviously too much offset  and we could not reduce the hip.  We went to a 36 standard neck.  We  placed on the 32.0 head.  Upon attempting to reduce, a crack was heard.  I did reduce the hip and it was great stability throughout, but I  inspected the proximal femur and saw there was the split in the proximal  femur at the level of where that screw was removed.  With the 36  standard neck and 32+ head.  The stability was full extension, full  external rotation, 70 degrees flexion, 40 degrees adduction, 90 degrees  internal rotation and 90 degrees of flexion, 70 degrees of internal  rotation.   I then exposed the proximal femur.  I incised the fascia of the vastus  lateralis and elevated the muscle up to gain the full extent of the  fracture.  I went down about 6-7 cm starting anteriorly, then coursing  along the lateral cortex of the femur.  I decided this would be  something that needed to be fixed.  I then placed three Zimmer cables  circumferentially around the femur starting the  distal aspect of the  fracture and coursing up proximally just below the lesser trochanter.  The cables were individually tightened using tightening system and once  at adequate tension.  The screwdriver was used to lock down the locking  mechanism and then the cables were cut.  This effectively reduced the  fracture and maintain reduction.  I then removed the femoral component  after dislocating the hip.  The trial liner for the acetabulum was also  removed.  With retractors in place and the permanent 32 mm neutral +4  Marathon liner is placed into the acetabular shell after I placed the  apex hole eliminator.  On the femoral side we then placed the 18D small  sleeve with an 18 x 13 stem, 36 standard neck in about 20 degrees of  anteversion.  With the 32 + 0 head.  Stability was fantastic with full  extension, full external rotation, 70 degrees flexion, 40 degrees  adduction, 90 degrees internal rotation and 90 degrees of flexion and 70  degrees of internal rotation.  I inspected along the lateral cortex of  the femur and the stem is well below the distal extent of the fracture  line.  The fracture did not propagate when we impacted the stem.  Wound  was then copiously irrigated with saline solution and short rotators  reattached to the femur through drill holes.  The fascia of the vastus  lateralis was then closed with running #1 Ethibond suture.  Fascia lata  was closed over Hemovac drain with interrupted #1 Vicryl, subcu closed  #1-0 and #2-0 Vicryl and subcuticular running 4-0 Monocryl.  Incisions  cleaned and dried and Steri-Strips and bulky sterile dressing are  applied.  She then awakened and transported to recovery in stable  condition.      Ollen Gross, M.D.  Electronically Signed     FA/MEDQ  D:  01/31/2009  T:  02/01/2009  Job:  [id number]

## 2010-12-26 NOTE — Procedures (Signed)
NAME:  [name], [name]                 ACCOUNT NO.:  [id number]   MEDICAL RECORD NO.:  [id number]          PATIENT TYPE:  OUT   LOCATION:  SLEEP CENTER                 FACILITY:  [facility]   PHYSICIAN:  Clinton D. Maple Hudson, MD, FCCP, FACPDATE OF BIRTH:  05-12-1935   DATE OF STUDY:  11/27/2007                            NOCTURNAL POLYSOMNOGRAM   REFERRING PHYSICIAN:  Noberto Retort, M.D.   INDICATION FOR STUDY:  Hypersomnia with sleep apnea.   EPWORTH SLEEPINESS SCORE:  10/24.  BMI 37.6.  Weight 186 pounds, height  59 inches.  Neck 15 inches.   MEDICATIONS:  Home medications charted and reviewed.   SLEEP ARCHITECTURE:  Total sleep time 356 minutes with sleep efficiency  89.7%.  Stage 1 was 3.2%, stage 2 was 82%, stage 3 was absent, REM was  14.7% of total sleep time.  Sleep latency 10.5 minutes, REM latency 74  minutes.  Awake after sleep onset 31.5 minutes.  Arousal index 13.7.  Simvastatin and aspirin were taken at 2140 p.m.  A sleep aid, name  unknown, was taken at 2245 p.m.   RESPIRATORY DATA:  Apnea/hypopnea index (AHI) 5.1 per hour.  Respiratory  disturbance index (RDI) 10.1 per hour, indicating mild obstructive sleep  apnea/hypopnea syndrome.  30 events were scored, including 1 obstructive  apnea and 29 hypopneas.  Events were not positional.  REM AHI 29.7.  Diagnostic NPSG protocol was requested and followed.   OXYGEN DATA:  Mild intermittent snoring noted by the technician as well  as some coughing and paradoxical breathing.  Oxygen desaturation nadir  80%.  Mean oxygen saturation through the study was 92.8% on room air.  A  total of 1.6 minutes was spent with oxygen saturation of less than 88%.   CARDIAC DATA:  Normal sinus rhythm.   MOVEMENT-PARASOMNIA:  No significant movement disturbance.  Bathroom x1.   IMPRESSIONS-RECOMMENDATIONS:  1. Mild obstructive sleep apnea/hypopnea syndrome (apnea/hypopnea      index 5.1), respiratory disturbance index 10.1, normal range 0-5.     Nonpositional events with mild snoring.  2. Scores in this range would qualify for continuous positive airway      pressure trial only if there is significant evidence of sleep      disturbance or secondary complications such as mood change or      hypertension associated      with sleep apnea.  3. Note, the patient also complains that chronic cough disturbs her      sleep.      Clinton D. Maple Hudson, MD, [facility] FACP  Diplomate, Biomedical engineer of Sleep Medicine  Electronically Signed     CDY/MEDQ  D:  11/30/2007 10:34:22  T:  11/30/2007 10:55:47  Job:  [id number]

## 2010-12-26 NOTE — Discharge Summary (Signed)
[name], [name]                 ACCOUNT NO.:  [id number]   MEDICAL RECORD NO.:  [id number]          PATIENT TYPE:  INP   LOCATION:  1607                         FACILITY:  [facility]   PHYSICIAN:  Ollen Gross, M.D.    DATE OF BIRTH:  1932-09-01   DATE OF ADMISSION:  01/31/2009  DATE OF DISCHARGE:  02/04/2009                               DISCHARGE SUMMARY   ADMITTING DIAGNOSES:  1. Osteoarthritis, left hip.  2. Past history of bronchitis.  3. Past history of pneumonia.  4. Past history of shingles.  5. Hypertension.  6. Osteoporosis.  7. History of colonic polyps.  8. History of thyroid cyst.  9. Hypercholesterolemia.   DISCHARGE DIAGNOSES:  1. Osteoarthritis, left hip, status post left total hip replacement      arthroplasty.  2. Postop acute blood loss anemia.  3. Status post transfusion without sequelae.  4. Past history of bronchitis.  5. Past history of pneumonia.  6. Past history of shingles.  7. Hypertension.  8. Osteoporosis.  9. History of colonic polyps.  10.History of thyroid cyst.  11.Hypercholesterolemia.   PROCEDURE:  January 31, 2009, left total hip.  Surgeon, Dr. Lequita Halt.  Assistant, Avel Peace, P.A.-C.  Anesthesia was general.   CONSULTS:  [facility].   BRIEF HISTORY:  [name] is a 77 year old female who has known  progressive osteoarthritis of the left hip.  She had a previous injury  and fracture years ago and has gone on to develop significant arthritis.  She felt she would benefit from undergoing hip replacement.  Risks and  benefits have been discussed.  She elected to proceed with surgery.   LABORATORY DATA:  Preop CBC showed hemoglobin of 12.9, hematocrit of  38.7, white cell count 8.2, platelets 204.  PT/INR 13.0 and 1.0 with a  PTT of 29.  Chem panel on admission all within normal limits.  Preop UA  is negative.  Serial CBCs were followed.  Hemoglobin dropped down to  9.5, then 8.3, got as low as 7.8, given 2 units of blood.   Post  transfusion, hemoglobin back up to 11.1 with a crit of 32.7.  Serial pro  times followed.  Last noted PT/INR 24.4 and 2.1.  Serial BMETs were  followed.  Electrolytes remained within normal limits.   X-RAYS:  1. Chest x-ray, August 24, 2008, no active cardiopulmonary disease.  2. Left hip film, January 27, 2009, advanced degenerative changes of left      hip.  3. Left hip films on November 17, 2008, post-traumatic deformity and      foreshortening of the left femur with worsening secondary      osteoarthritis changes, left hip.   EKG, Jan 05, 2009, sinus rhythm within normal limits.   HOSPITAL COURSE:  Patient was admitted to [facility], taken to  the OR, underwent above-stated procedure, left total hip.  She was later  transferred to recovery room then orthopedic floor.  There was an  intraoperative periprostatic proximal femur crack and fracture which was  treated with cabling at the time of surgery with  excellent fixation.  She was transferred to the recovery room and then the orthopedic floor.  Continued postoperative care.  She was allowed to be partial  weightbearing 25% to 50% to the left leg.  She wanted to look in to [facility] so we got a [facility] consult to evaluate the patient.  She was doing pretty well on day 1.  Hemoglobin was 9.5.  She was  asymptomatic with the low hemoglobin.  Urine output was good.  Encouraged incentive spirometer.  Patient was seen by [facility] and felt to be there were no significant medical conditions to  necessitate inpatient rehab.  Therefore, we got social work involved to  look in to skilled facilities.  By day 2, she was doing a little bit  better.  Hemoglobin was down a little bit lower to 8.3.  She was  asymptomatic with a low hemoglobin.  We rechecked it and it was 8.6 that  afternoon.  Therefore, we just monitored it.  She was on iron  supplement.  She started getting up with therapy, walking  short  distances.  Unfortunately, by the next day, hemoglobin was further down  to 7.8 at which point she did receive blood.  She was not ready for  transfer at that time.  Not available to go to [facility] but looking in  to skilled facility.  She was a little tachy on her pulse rate of 105,  was felt to be due to the anemia.  She tolerated the blood well.  Despite being anemic, she was walking about 70 feet.  By postop day 4,  pulse had improved and her hemoglobin was back up to 11.1.  We were  waiting on final bed offers once she decided we would transfer the  patient out at that time.   DISCHARGE PLAN:  1. Patient will be transferred to skilled nursing facility of choice      on February 04, 2009.  2. Discharge diagnoses:  Please see above.   DISCHARGE MEDS:  Current medications at time of transfer include:  1. Coumadin protocol.  Please titrate the Coumadin level for a target      INR between 2.0 and 3.0.  She needs to be on Coumadin for 3 weeks      from the date of surgery.  2. Colace 100 mg p.o. b.i.d.  3. Hydrochlorothiazide 12.5 mg p.o. daily.  4. Nu-Iron 150 mg daily for 2 more weeks then discontinue the iron.  5. Robaxin 500 mg p.o. every 6 to 8 hours p.r.n. spasm.  6. Ambien 5 mg p.o. q.h.s. p.r.n. sleep.  7. Tylenol 325 one or two every 4 to 6 hours needed for mild pain,      temp, or headache.  8. Percocet 5 mg one or two every 4 hours as needed for pain.  9. Laxative of choice.  10.Enema of choice.   ACTIVITY:  She is partial weightbearing, 25% to 50%, to the left lower  extremity.  Hip precautions, total hip protocol.  Gait training,  ambulation, ADLs, range of motion, strength exercises, PT and OT.  She  needs to be up out of bed minimum b.i.d. with therapy minimum daily.  She may start showering, however, do not submerge the incision under  water.  Reapply dry dressing to the left hip incision following bathing  or showering.  Do not submerge the incision under  water.   FOLLOWUP:  She needs to follow  up with Dr. Lequita Halt in the office the  week of February 14, 2009, through February 18, 2009.  Please contact the office  at [phone] to help arrange appointment time and followup of this  patient for further care.   DISPOSITION:  Pending at the time of this dictation.  We are waiting on  final bed offers and choice of skilled nursing facility bed.   CONDITION UPON DISCHARGE:  Improving.      Alexzandrew L. Perkins, P.A.C.      Ollen Gross, M.D.  Electronically Signed    ALP/MEDQ  D:  02/04/2009  T:  02/04/2009  Job:  [id number]   cc:   Ollen Gross, M.D.  Fax: [phone]   Melida Quitter, M.D.  Fax: [phone]   Benn Moulder, PA-C  Fax: [phone]

## 2010-12-29 NOTE — Op Note (Signed)
[name], [name]                 ACCOUNT NO.:  [id number]   MEDICAL RECORD NO.:  [id number]          PATIENT TYPE:  AMB   LOCATION:  ENDO                         FACILITY:  [facility]   PHYSICIAN:  Bernette Redbird, M.D.   DATE OF BIRTH:  1933-08-08   DATE OF PROCEDURE:  07/21/2004  DATE OF DISCHARGE:                                 OPERATIVE REPORT   PROCEDURE:  Colonoscopy.   INDICATION:  Prior history of a small colonic adenoma, having been removed  approximately 5 years ago.   FINDINGS:  Normal, except for diverticulosis.   PROCEDURE:  The nature, purpose, and risks of the procedure were familiar to  the patient from prior examination. She provided written consent.  Sedation  was Fentanyl 50 mcg and Versed 7.5 mg without arrhythmias or desaturation.   The Olympus adjustable tension pediatric video-colonoscope was advanced to  the terminal ileum, which had a normal appearance. Pull back was then  performed. The quality of the prep was excellent, and it is felt that all  areas were well seen.   The patient had some minimal right-sided diverticulosis and some moderate  left-sided diverticulosis, but this was otherwise a normal examination,  without any evidence of colon polyps, cancer, colitis or vascular  malformations. Retroflexion of the rectum on re-inspection of the rectum  were unremarkable. No biopsies were obtained. The patient tolerated the  procedure well and there were no apparent complications.   This was a normal examination.  No polyps, cancer, vascular malformations,  or diverticulosis were noted.  Retroflexion in the rectum was normal.  Re-  inspection of the rectum was normal.  No biopsies were obtained.  The  patient tolerated the procedure well, and there were no apparent  complications.   IMPRESSION:  1.  Prior history of colonic adenoma, without worrisome findings on current      examination (V12.72).  2.  Sigmoid diverticulosis.   PLAN:  Followup  colonoscopy in 5 years.       RB/MEDQ  D:  07/21/2004  T:  07/22/2004  Job:  [id number]   cc:   [facility]

## 2010-12-29 NOTE — H&P (Signed)
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 [name] generally is in good  health. She states that on the 18th she initially saw Dr. Tiburcio Pea with  complaints of right-sided flank discomfort and cough. She was thought to  have bronchitis and was placed on a Z-Pak. She continued to feel fatigued,  have malaise but want to get the medication time to work. However, today at  about 5 p.m., she had an episode of hemoptysis. She therefore went to the  [facility] where a chest x-ray was done suggesting a pulmonary  process and she was sent to the emergency room. In the emergency room, her  chest x-ray shows multiple pulmonary nodules which appear to be abscesses.  It is unclear whether these represent an infectious or malignant process.  She is therefore admitted for further evaluation. [name] states that she  has not been aware of having any fever. She has not noticed any dyspnea. She  has not had any wheezing. She has had a persistent cough. There has been no  nausea or vomiting. She states that her stools have been slightly loose.  There has been no hematemesis or melena. She discontinued cigarette smoking  40 years ago and has probably about a 20 pack-year smoking history prior to  that time.   PAST MEDICAL HISTORY:  Medications consist of Zocor 20 mg daily,  hydrochlorothiazide 12.5 mg daily.  She is allergic to PENICILLIN.   OPERATION:  She has had a previous hysterectomy in 1969, a breast biopsy in  the 1990s and some type of leg operation at age 51 to  straighten out her  leg. She says she has never been hospitalized for any medical problems.   MEDICAL ILLNESSES:  1.  Hypertension.  2.  Hyperlipidemia. She states that her blood pressure has been reasonably      well regulated recently.   FAMILY HISTORY:  The patient lives alone. She states that she has no  children. She has a brother who died of problems related to diabetes. She  has two sisters who are in good health. Her mother died at the age of 3 of  some type of cancer. Her father died more recently at the age of 16 of  complications related to congestive heart failure.   SOCIAL HISTORY:  Please see above. Note that the patient has no history of  alcohol abuse or drug abuse.   REVIEW OF SYSTEMS:  HEAD:  She denies headache or dizziness. EYES:  She  denies visual blurring or diplopia.  EAR/NOSE/THROAT:  Denies earache, sinus  pain or sore throat.  CHEST:  See above.  CARDIOVASCULAR:  Denies orthopnea  or PND. GI:  Denies nausea, vomiting or abdominal pain. GU:  Denies dysuria  or urinary frequency. NEURO:  There is no history of seizure or stroke.  ENDO:  Denies excessive thirst, urinary frequency or nocturia.   PHYSICAL EXAM:  [name] is a very pleasant cooperative lady who is in no  distress.  Blood pressure is 163/84, pulse 100, respirations 20, O2  saturation was 94%.  HEENT exam is within normal limits.  The chest exam is  quite unremarkable. I do not hear any wheezing or rhonchi. Cardiovascular  exam reveals normal S1 and S2 without rubs, murmurs or gallops. The abdomen  is benign. There are normal bowel sounds without masses or tenderness.  Neurologic testing and examination of the extremities normal.   IMPRESSION:  1.  Chest x-ray indicating multiple pulmonary nodules possibly abscesses or      consider malignancy.  2.  20-pack-year smoking history.  3.  Hypertension.  4.  Hyperlipidemia.  5.  History of hysterectomy.  6.  Mild hypokalemia.   PLAN:  Admit the  patient. We will start her on antibiotic therapy. We will  also obtain a CT scan of the chest to better define the nature of her  pulmonary process. Pulmonary consultation may ultimately be indicated.           ______________________________  Sherin Quarry, MD     SY/MEDQ  D:  12/05/2005  T:  12/06/2005  Job:  [id number]   cc:   Holley Bouche, M.D.  Fax: [phone]

## 2010-12-29 NOTE — Discharge Summary (Signed)
NAMEMODEST, DRAEGER                 ACCOUNT NO.:  [id number]   MEDICAL RECORD NO.:  [id number]          PATIENT TYPE:  INP   LOCATION:  1608                         FACILITY:  [facility]   PHYSICIAN:  Hollice Espy, M.D.DATE OF BIRTH:  Jun 27, 1933   DATE OF ADMISSION:  12/05/2005  DATE OF DISCHARGE:  12/12/2005                                 DISCHARGE SUMMARY   CONSULTATIONS:  Dr. Sherene Sires, critical care medicine.   PRIMARY CARE PHYSICIAN:  Stacie Acres. White, M.D.   DISCHARGE DIAGNOSES:  1.  Multiple lung abscesses, causing cavitary lesions.  2.  Incidental atrial septal aneurysm.  3.  Hypertension.  4.  Hyperlipidemia.   DISCHARGE MEDICATIONS:  Avelox 400 mg one p.o. daily x7 days, clindamycin  450 mg p.o. four times a day x7 days.  The patient will also continue on all  of her previous medications.  These are as follows:  Zocor 20 p.o. daily,  hydrochlorothiazide 12.5 p.o. daily, and aspirin 81 p.o. daily.   HOSPITAL COURSE:  The patient is a 77 year old very pleasant African-  American female with a past medical history of hypertension, hyperlipidemia,  who presented to her PCP complaining of some right-sided back discomfort and  cough.  She was initially thought to have bronchitis and was placed on a Z-  PAK.  She continued to feel fatigued, had malaise, and on the day of  admission, was noted to have an episode of hemoptysis.  She went to the  [facility], where a chest x-ray showed a pulmonary process, so she  was sent to the emergency room.  A chest x-ray there showed multiple  pulmonary nodules, consistent with abscesses.  It was unclear whether this  was an infectious or malignant process or other, and the patient was  admitted for further evaluation.  The patient was admitted and started on  broad-spectrum IV antibiotics, including IV vancomycin, clindamycin, and  Avelox.  There was a concern of whether the patient may be having some  possible septic emboli.  A 2-D  echo initially was done, which showed no  evidence of any type of vegetations on the valves.  This was followed up by  a TEE done by Dr. Eldridge Dace of [facility], who noted no evidence of any  type of vegetation on the valves, but did note an incidentally-noted atrial  septal aneurysm.  In the meantime, the patient was continued on her  antibiotics.  Dr. Sherene Sires from pulmonary and critical care medicine was  consulted for these lung lesions, possibly necrotizing pneumonia versus  inflammatory versus metastatic process.  On review of the 2-D echo, Dr. Sherene Sires  agreed with continued antibiotics and recommended TEE.  In the meantime,  with the TEE done and negative, Dr. Sherene Sires followed up and follow-up chest x-  ray was done on 05/01, which actually showed improvement significantly of  these lesions following antibiotic treatment.  At this time, then he  recommended that no bronch was needed, as well as no biopsy.  The etiology  remained unclear.  This was possibly secondary to a dental procedure done  in  09/2005.  An acid fast sputum was negative and no evidence of any vasculitis  or TB was felt, given the significant improvement on antibiotics.  The  patient's note that an ANCA level was drawn is still pending.  Dr. Sherene Sires  recommended continuing empiric antibiotic treatments for another week for a  total of two weeks.  At that time, a follow-up chest x-ray may be done by  Dr. Cliffton Asters.   PLAN:  Discharge the patient home, as at this point, she is breathing  comfortably on room air, with no complaints.  She will continue on one more  week of antibiotics for a total of two weeks and then follow up with her  PCP, Dr. Cliffton Asters, for a follow-up chest x-ray.   DISPOSITION:  Improved.   ACTIVITY:  Slow to increase her mobility.  She is not cleared to return to  work until she is followed up by her PCP.   DIET:  Low-sodium diet.   She is being discharged to home.      Hollice Espy, M.D.   Electronically Signed     SKK/MEDQ  D:  12/12/2005  T:  12/13/2005  Job:  [id number]

## 2011-05-08 ENCOUNTER — Other Ambulatory Visit: Payer: Self-pay | Admitting: Family Medicine

## 2011-05-08 DIAGNOSIS — Z1231 Encounter for screening mammogram for malignant neoplasm of breast: Secondary | ICD-10-CM

## 2011-06-06 ENCOUNTER — Ambulatory Visit
Admission: RE | Admit: 2011-06-06 | Discharge: 2011-06-06 | Disposition: A | Discharge status: Home / Self Care | Payer: Medicare Other | Source: Ambulatory Visit | Attending: Family Medicine | Admitting: Family Medicine

## 2011-06-06 DIAGNOSIS — Z1231 Encounter for screening mammogram for malignant neoplasm of breast: Secondary | ICD-10-CM

## 2011-06-06 IMAGING — MG MM DIGITAL SCREENING BILAT
6 series · 6 of 6 positions shown · non-contrast
Comparison: none

DG SCREEN MAMMOGRAM BILATERAL
Bilateral CC and MLO view(s) were taken.

DIGITAL SCREENING MAMMOGRAM WITH CAD:
There are scattered fibroglandular densities.  No masses or malignant type calcifications are 
identified.  Compared with prior studies.
Images were processed with CAD.

[R CC]
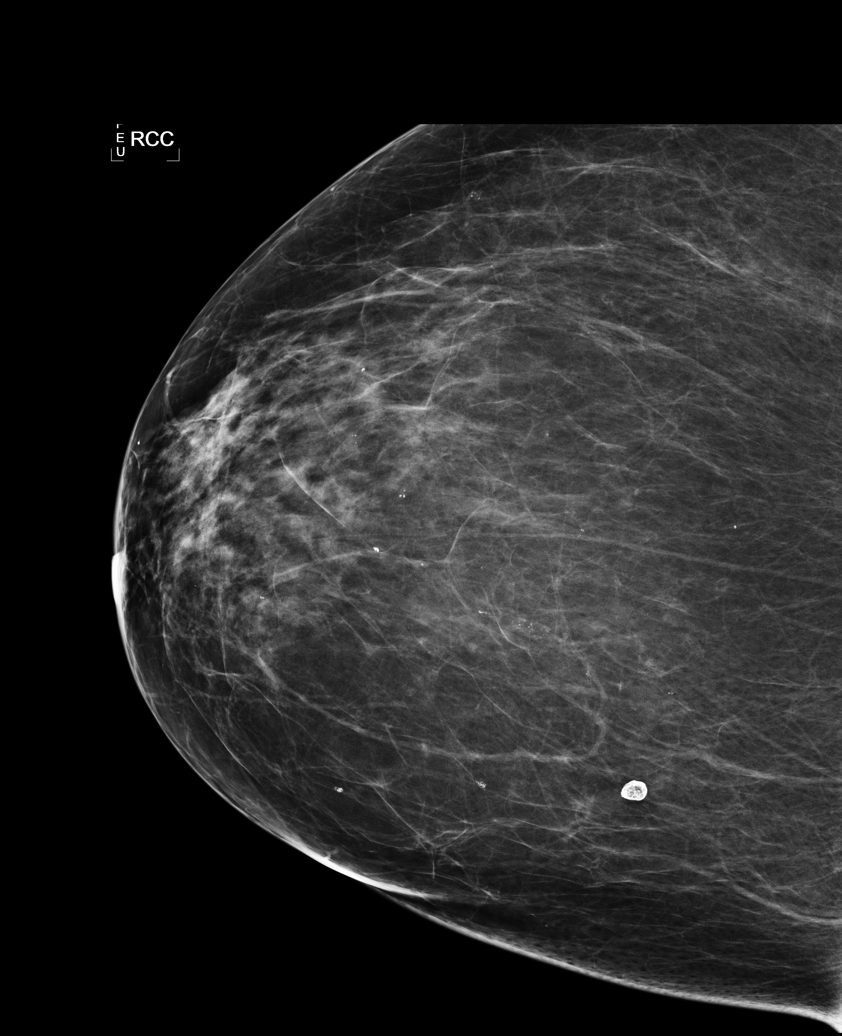

[L CC]
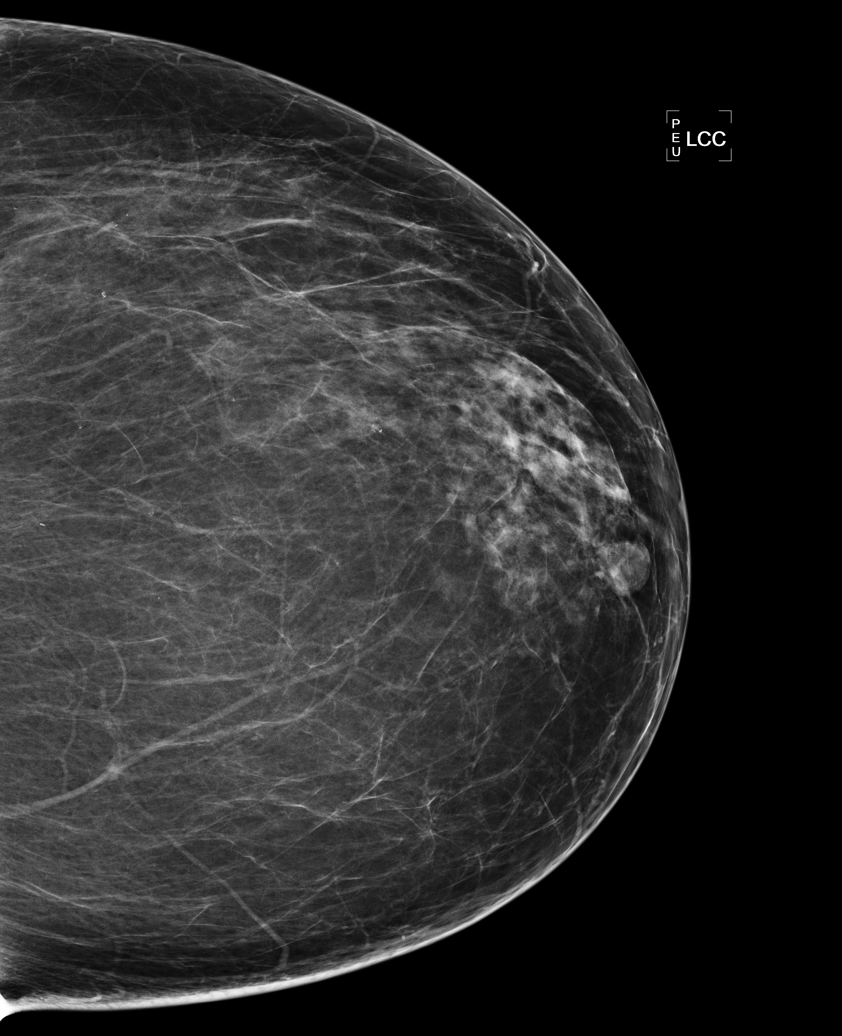

[L MLO (1 of 3)]
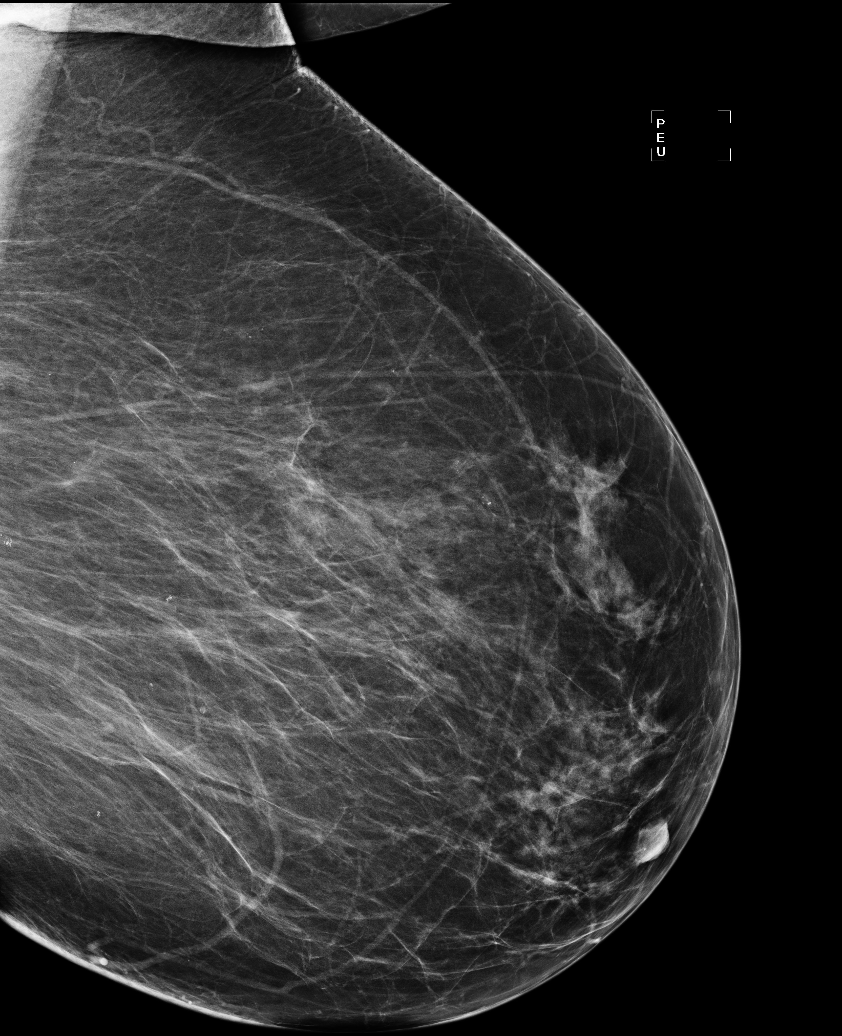

[R MLO]
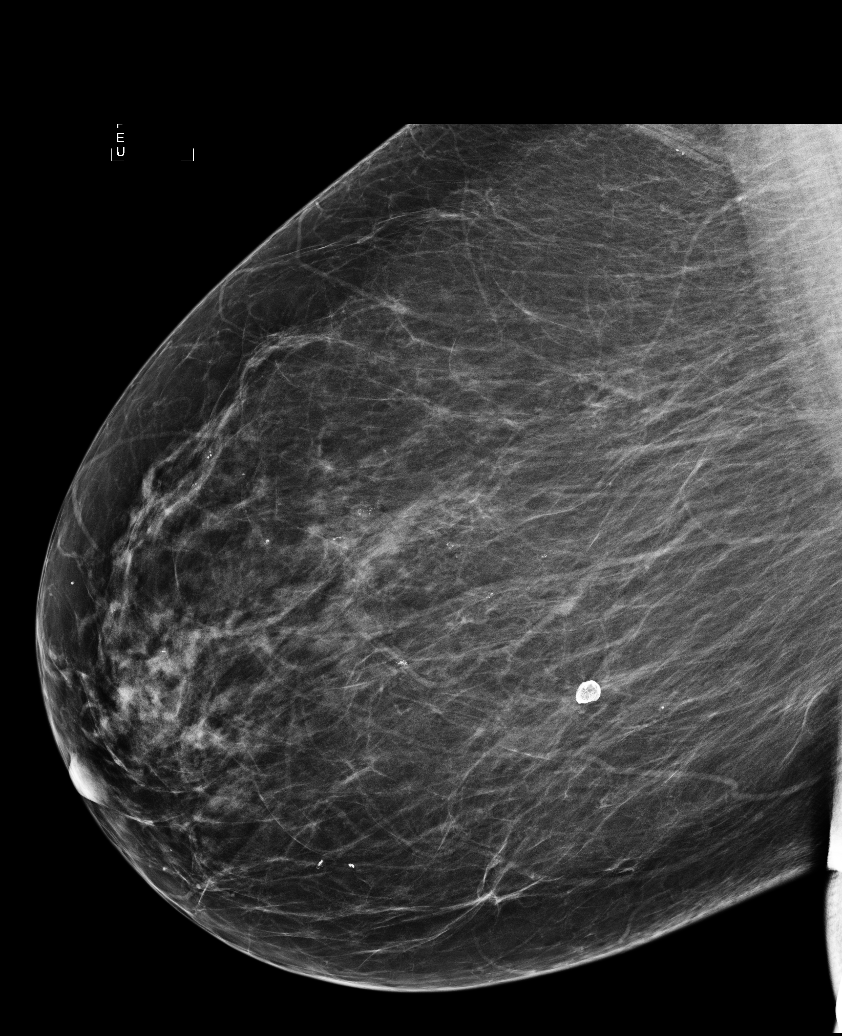

[L MLO (2 of 3)]
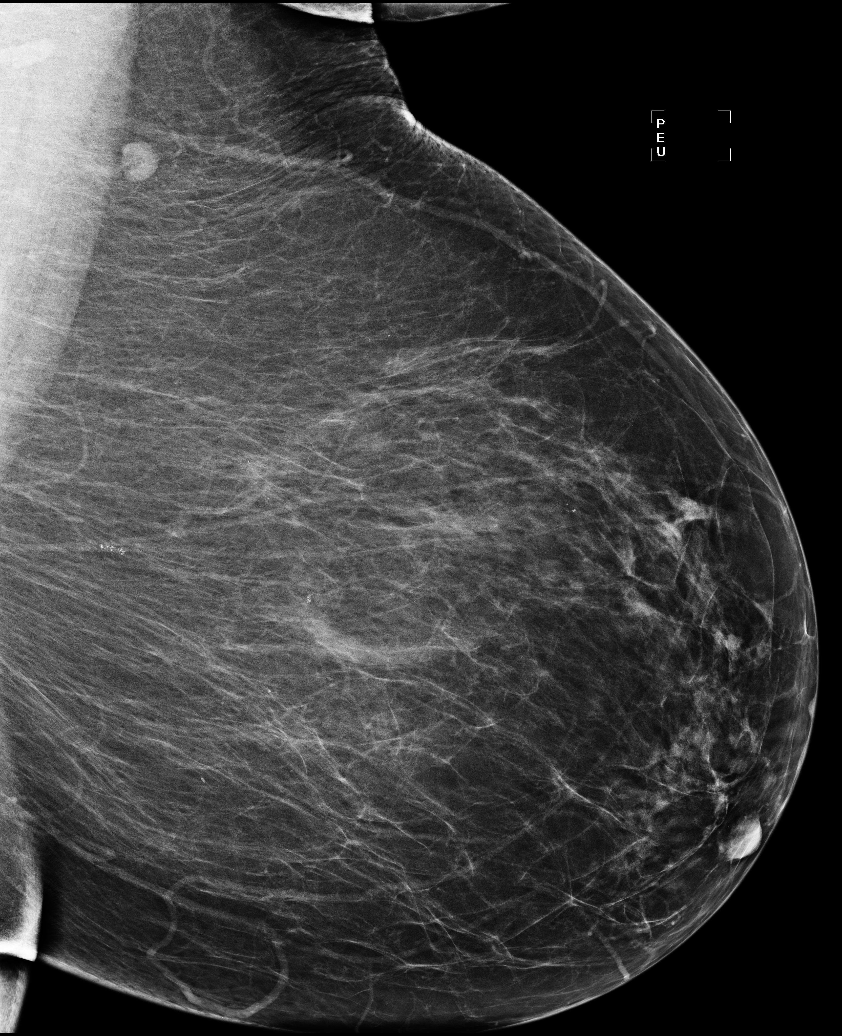

[L MLO (3 of 3)]
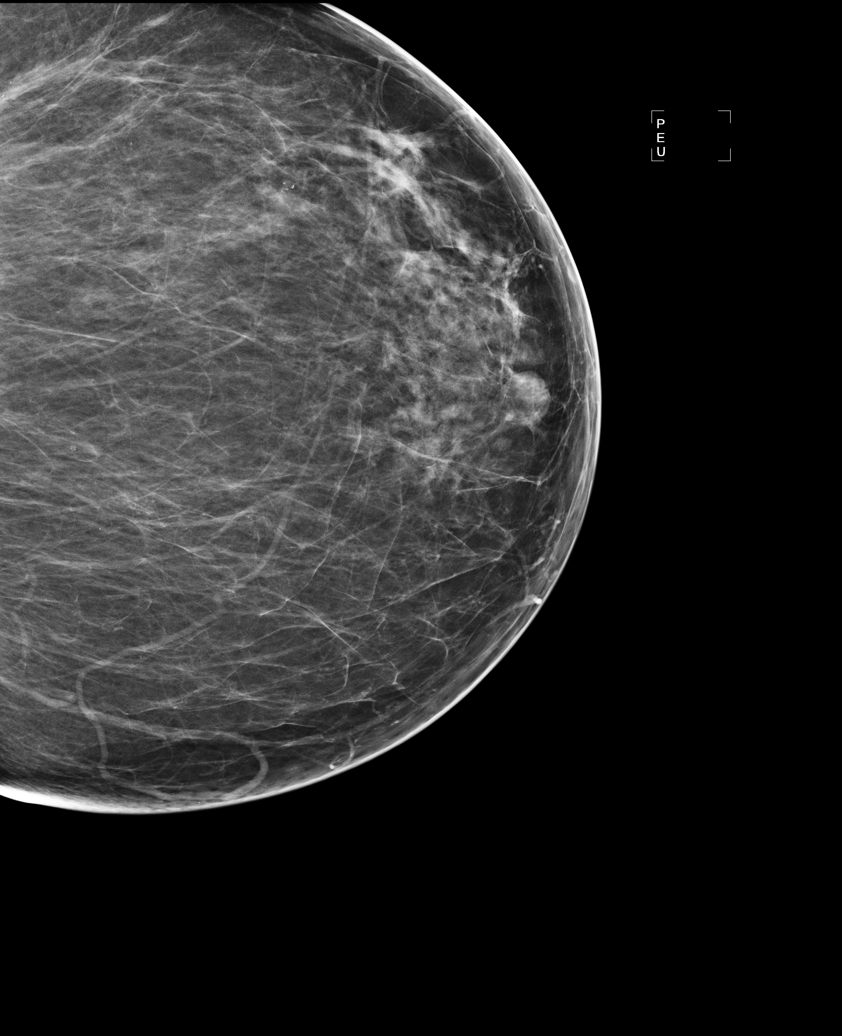

[6 of 6 positions shown; findings below may reference images not displayed]

IMPRESSION: No specific mammographic evidence of malignancy.  Next screening mammogram is recommended in one 
year.

A result letter of this screening mammogram will be mailed directly to the patient.

ASSESSMENT: Negative - BI-RADS 1

Screening mammogram in 1 year.
,

## 2011-06-07 ENCOUNTER — Ambulatory Visit (HOSPITAL_COMMUNITY)
Admission: RE | Admit: 2011-06-07 | Discharge: 2011-06-07 | Disposition: A | Discharge status: Home / Self Care | Payer: Medicare Other | Source: Ambulatory Visit | Attending: Family Medicine | Admitting: Family Medicine

## 2011-06-07 DIAGNOSIS — Z87891 Personal history of nicotine dependence: Secondary | ICD-10-CM | POA: Insufficient documentation

## 2011-06-07 DIAGNOSIS — J988 Other specified respiratory disorders: Secondary | ICD-10-CM | POA: Insufficient documentation

## 2011-06-07 DIAGNOSIS — R05 Cough: Secondary | ICD-10-CM | POA: Insufficient documentation

## 2011-06-07 DIAGNOSIS — R059 Cough, unspecified: Secondary | ICD-10-CM | POA: Insufficient documentation

## 2011-06-07 DIAGNOSIS — R0602 Shortness of breath: Secondary | ICD-10-CM | POA: Insufficient documentation

## 2011-08-20 DIAGNOSIS — J40 Bronchitis, not specified as acute or chronic: Secondary | ICD-10-CM | POA: Diagnosis not present

## 2011-08-20 DIAGNOSIS — J111 Influenza due to unidentified influenza virus with other respiratory manifestations: Secondary | ICD-10-CM | POA: Diagnosis not present

## 2011-09-18 DIAGNOSIS — M1710 Unilateral primary osteoarthritis, unspecified knee: Secondary | ICD-10-CM | POA: Diagnosis not present

## 2011-10-03 DIAGNOSIS — E785 Hyperlipidemia, unspecified: Secondary | ICD-10-CM | POA: Diagnosis not present

## 2011-10-03 DIAGNOSIS — I10 Essential (primary) hypertension: Secondary | ICD-10-CM | POA: Diagnosis not present

## 2011-10-03 DIAGNOSIS — J449 Chronic obstructive pulmonary disease, unspecified: Secondary | ICD-10-CM | POA: Diagnosis not present

## 2011-10-18 DIAGNOSIS — L608 Other nail disorders: Secondary | ICD-10-CM | POA: Diagnosis not present

## 2011-10-18 DIAGNOSIS — I739 Peripheral vascular disease, unspecified: Secondary | ICD-10-CM | POA: Diagnosis not present

## 2011-11-08 DIAGNOSIS — H25019 Cortical age-related cataract, unspecified eye: Secondary | ICD-10-CM | POA: Diagnosis not present

## 2011-12-04 DIAGNOSIS — M1710 Unilateral primary osteoarthritis, unspecified knee: Secondary | ICD-10-CM | POA: Diagnosis not present

## 2011-12-13 DIAGNOSIS — M1710 Unilateral primary osteoarthritis, unspecified knee: Secondary | ICD-10-CM | POA: Diagnosis not present

## 2011-12-21 DIAGNOSIS — H2510 Age-related nuclear cataract, unspecified eye: Secondary | ICD-10-CM | POA: Diagnosis not present

## 2011-12-21 DIAGNOSIS — M1710 Unilateral primary osteoarthritis, unspecified knee: Secondary | ICD-10-CM | POA: Diagnosis not present

## 2012-02-04 DIAGNOSIS — H269 Unspecified cataract: Secondary | ICD-10-CM | POA: Diagnosis not present

## 2012-02-04 DIAGNOSIS — H2510 Age-related nuclear cataract, unspecified eye: Secondary | ICD-10-CM | POA: Diagnosis not present

## 2012-02-07 DIAGNOSIS — M1710 Unilateral primary osteoarthritis, unspecified knee: Secondary | ICD-10-CM | POA: Diagnosis not present

## 2012-02-25 DIAGNOSIS — H269 Unspecified cataract: Secondary | ICD-10-CM | POA: Diagnosis not present

## 2012-02-25 DIAGNOSIS — H2510 Age-related nuclear cataract, unspecified eye: Secondary | ICD-10-CM | POA: Diagnosis not present

## 2012-03-27 DIAGNOSIS — H11159 Pinguecula, unspecified eye: Secondary | ICD-10-CM | POA: Diagnosis not present

## 2012-04-16 DIAGNOSIS — J449 Chronic obstructive pulmonary disease, unspecified: Secondary | ICD-10-CM | POA: Diagnosis not present

## 2012-04-16 DIAGNOSIS — R351 Nocturia: Secondary | ICD-10-CM | POA: Diagnosis not present

## 2012-04-16 DIAGNOSIS — E785 Hyperlipidemia, unspecified: Secondary | ICD-10-CM | POA: Diagnosis not present

## 2012-04-16 DIAGNOSIS — I10 Essential (primary) hypertension: Secondary | ICD-10-CM | POA: Diagnosis not present

## 2012-04-16 DIAGNOSIS — M1990 Unspecified osteoarthritis, unspecified site: Secondary | ICD-10-CM | POA: Diagnosis not present

## 2012-05-08 ENCOUNTER — Other Ambulatory Visit: Payer: Self-pay | Admitting: Family Medicine

## 2012-05-08 DIAGNOSIS — Z1231 Encounter for screening mammogram for malignant neoplasm of breast: Secondary | ICD-10-CM

## 2012-05-16 DIAGNOSIS — J069 Acute upper respiratory infection, unspecified: Secondary | ICD-10-CM | POA: Diagnosis not present

## 2012-05-29 DIAGNOSIS — L84 Corns and callosities: Secondary | ICD-10-CM | POA: Diagnosis not present

## 2012-05-29 DIAGNOSIS — L608 Other nail disorders: Secondary | ICD-10-CM | POA: Diagnosis not present

## 2012-05-29 DIAGNOSIS — I739 Peripheral vascular disease, unspecified: Secondary | ICD-10-CM | POA: Diagnosis not present

## 2012-06-11 ENCOUNTER — Ambulatory Visit
Admission: RE | Admit: 2012-06-11 | Discharge: 2012-06-11 | Disposition: A | Discharge status: Home / Self Care | Payer: Medicare Other | Source: Ambulatory Visit | Attending: Family Medicine | Admitting: Family Medicine

## 2012-06-11 DIAGNOSIS — Z1231 Encounter for screening mammogram for malignant neoplasm of breast: Secondary | ICD-10-CM | POA: Diagnosis not present

## 2012-08-14 DIAGNOSIS — R0989 Other specified symptoms and signs involving the circulatory and respiratory systems: Secondary | ICD-10-CM | POA: Diagnosis not present

## 2012-08-14 DIAGNOSIS — R197 Diarrhea, unspecified: Secondary | ICD-10-CM | POA: Diagnosis not present

## 2012-08-14 DIAGNOSIS — R0609 Other forms of dyspnea: Secondary | ICD-10-CM | POA: Diagnosis not present

## 2012-08-14 DIAGNOSIS — J069 Acute upper respiratory infection, unspecified: Secondary | ICD-10-CM | POA: Diagnosis not present

## 2012-08-15 ENCOUNTER — Other Ambulatory Visit (HOSPITAL_COMMUNITY): Payer: Self-pay | Admitting: Family Medicine

## 2012-08-15 ENCOUNTER — Ambulatory Visit (HOSPITAL_COMMUNITY)
Admission: RE | Admit: 2012-08-15 | Discharge: 2012-08-15 | Disposition: A | Discharge status: Home / Self Care | Payer: Medicare Other | Source: Ambulatory Visit | Attending: Family Medicine | Admitting: Family Medicine

## 2012-08-15 DIAGNOSIS — R0989 Other specified symptoms and signs involving the circulatory and respiratory systems: Secondary | ICD-10-CM

## 2012-08-15 DIAGNOSIS — R0609 Other forms of dyspnea: Secondary | ICD-10-CM | POA: Diagnosis not present

## 2012-08-15 DIAGNOSIS — R197 Diarrhea, unspecified: Secondary | ICD-10-CM | POA: Diagnosis not present

## 2012-08-15 IMAGING — CR DG CHEST 2V
2 series · 2 of 2 positions shown · non-contrast
Comparison: [DATE]

CLINICAL DATA: Dyspnea

CHEST - 2 VIEW

[w chest pa]
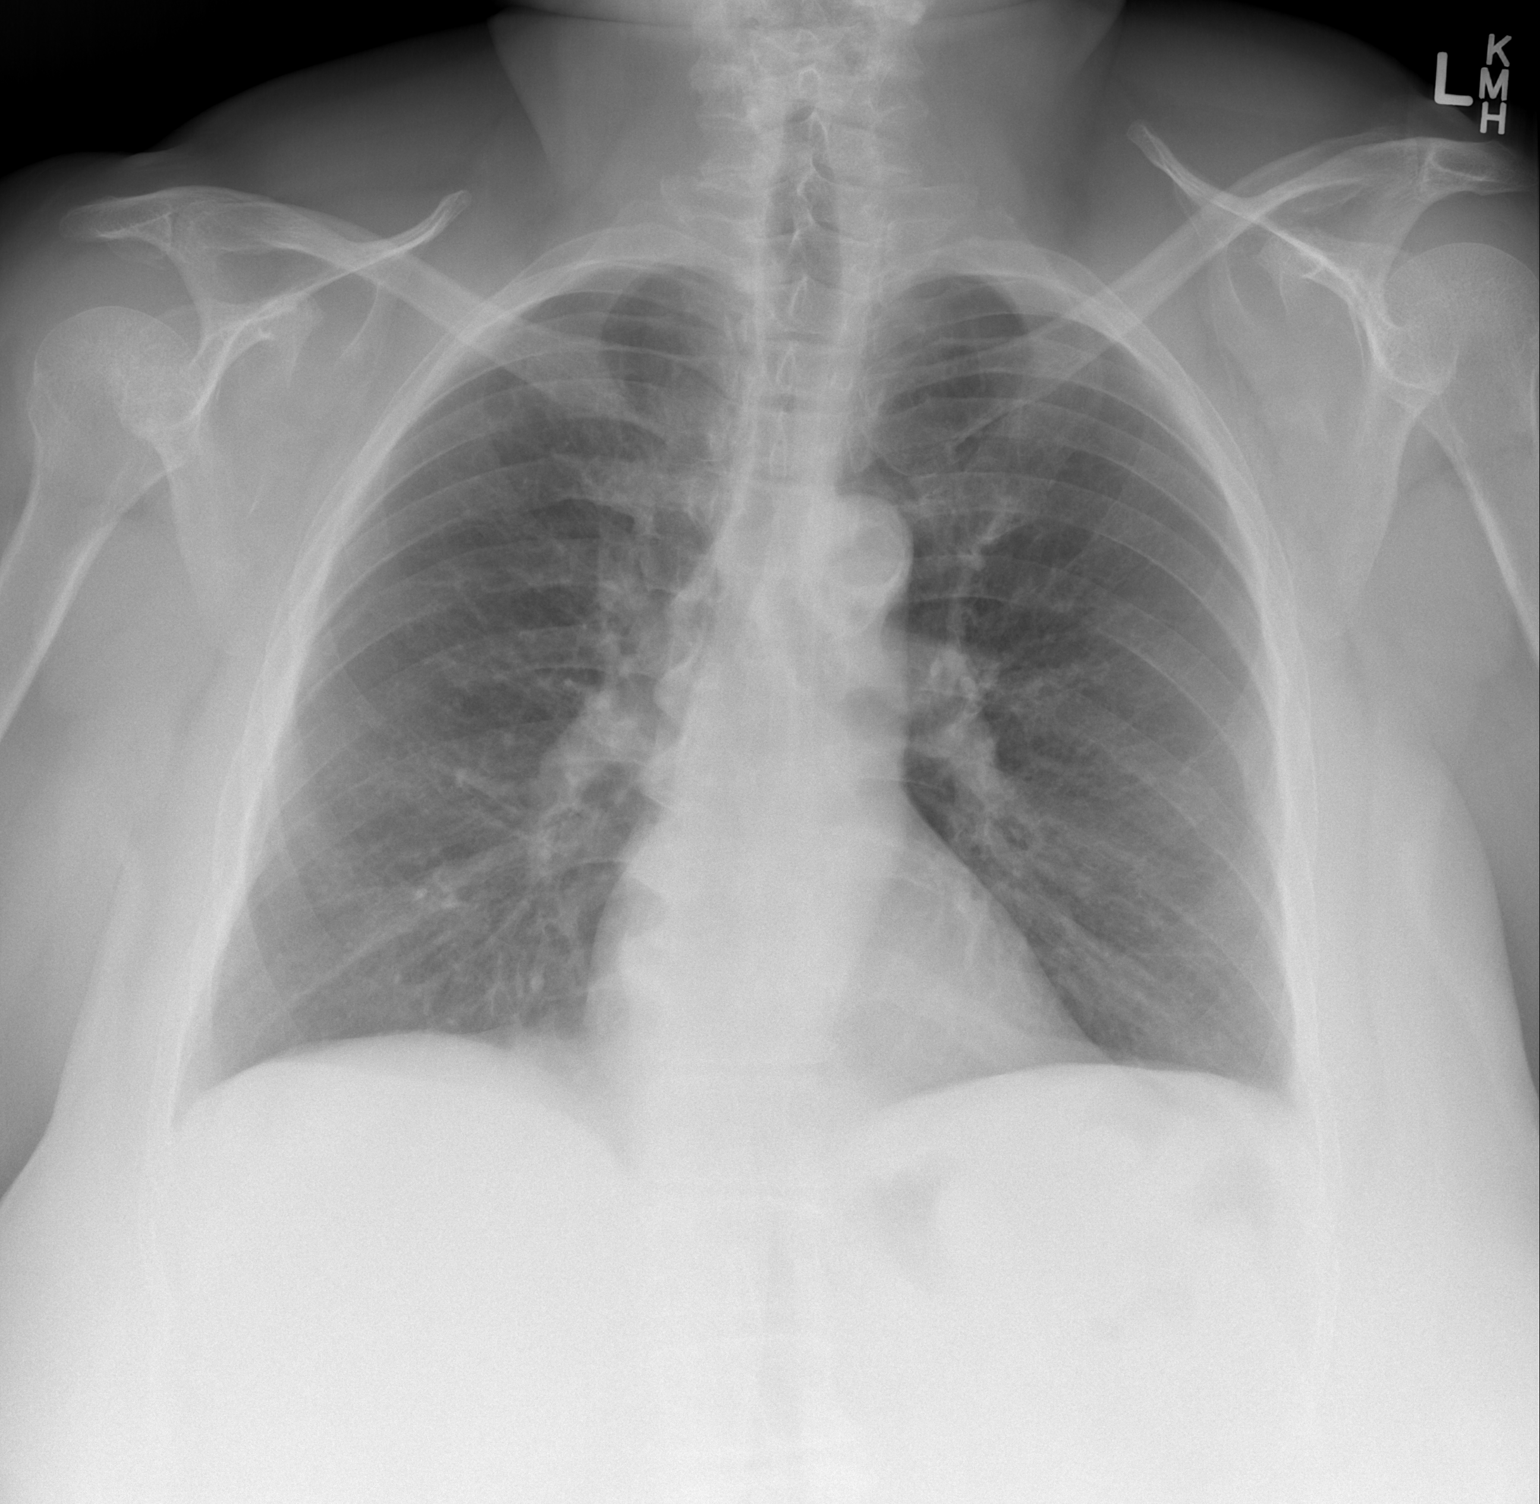

[w chest lat]
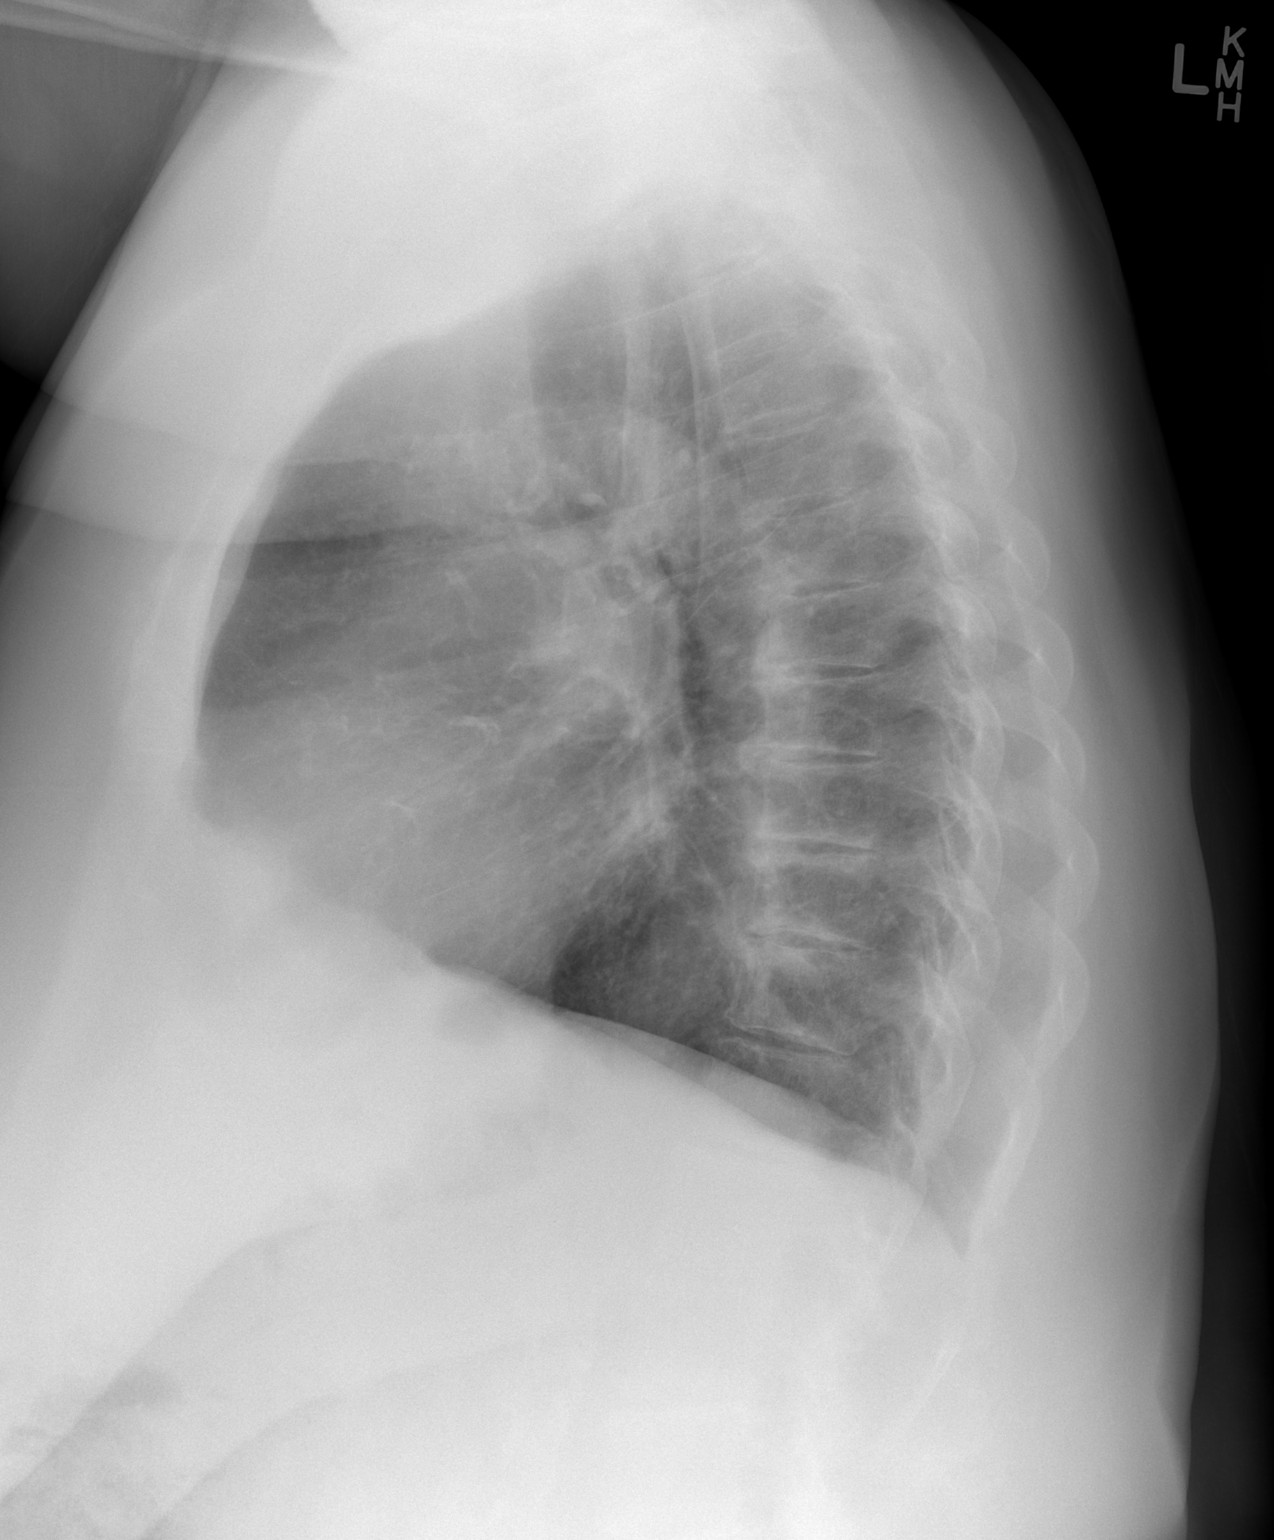

[2 of 2 positions shown; findings below may reference images not displayed]

FINDINGS: The heart and pulmonary vascularity are within normal
limits.  The lungs are clear bilaterally.  Mild degenerative
changes of the thoracic spine are seen.
IMPRESSION: No acute abnormality is noted.

## 2012-08-27 DIAGNOSIS — E785 Hyperlipidemia, unspecified: Secondary | ICD-10-CM | POA: Diagnosis not present

## 2012-08-27 DIAGNOSIS — J40 Bronchitis, not specified as acute or chronic: Secondary | ICD-10-CM | POA: Diagnosis not present

## 2012-09-04 DIAGNOSIS — L84 Corns and callosities: Secondary | ICD-10-CM | POA: Diagnosis not present

## 2012-09-04 DIAGNOSIS — I739 Peripheral vascular disease, unspecified: Secondary | ICD-10-CM | POA: Diagnosis not present

## 2012-09-04 DIAGNOSIS — L608 Other nail disorders: Secondary | ICD-10-CM | POA: Diagnosis not present

## 2012-09-24 DIAGNOSIS — E785 Hyperlipidemia, unspecified: Secondary | ICD-10-CM | POA: Diagnosis not present

## 2012-09-24 DIAGNOSIS — I10 Essential (primary) hypertension: Secondary | ICD-10-CM | POA: Diagnosis not present

## 2012-09-24 DIAGNOSIS — J449 Chronic obstructive pulmonary disease, unspecified: Secondary | ICD-10-CM | POA: Diagnosis not present

## 2012-09-24 DIAGNOSIS — R0609 Other forms of dyspnea: Secondary | ICD-10-CM | POA: Diagnosis not present

## 2012-10-03 DIAGNOSIS — I10 Essential (primary) hypertension: Secondary | ICD-10-CM | POA: Diagnosis not present

## 2012-10-07 DIAGNOSIS — J441 Chronic obstructive pulmonary disease with (acute) exacerbation: Secondary | ICD-10-CM | POA: Diagnosis not present

## 2012-10-22 DIAGNOSIS — R0609 Other forms of dyspnea: Secondary | ICD-10-CM | POA: Diagnosis not present

## 2012-11-14 DIAGNOSIS — J449 Chronic obstructive pulmonary disease, unspecified: Secondary | ICD-10-CM | POA: Diagnosis not present

## 2012-11-14 DIAGNOSIS — I10 Essential (primary) hypertension: Secondary | ICD-10-CM | POA: Diagnosis not present

## 2012-11-14 DIAGNOSIS — E785 Hyperlipidemia, unspecified: Secondary | ICD-10-CM | POA: Diagnosis not present

## 2012-11-21 DIAGNOSIS — E785 Hyperlipidemia, unspecified: Secondary | ICD-10-CM | POA: Diagnosis not present

## 2012-12-04 DIAGNOSIS — Z961 Presence of intraocular lens: Secondary | ICD-10-CM | POA: Diagnosis not present

## 2012-12-04 DIAGNOSIS — H524 Presbyopia: Secondary | ICD-10-CM | POA: Diagnosis not present

## 2012-12-04 DIAGNOSIS — Z0100 Encounter for examination of eyes and vision without abnormal findings: Secondary | ICD-10-CM | POA: Diagnosis not present

## 2012-12-05 DIAGNOSIS — J40 Bronchitis, not specified as acute or chronic: Secondary | ICD-10-CM | POA: Diagnosis not present

## 2013-05-08 ENCOUNTER — Other Ambulatory Visit: Payer: Self-pay

## 2013-05-08 DIAGNOSIS — Z1231 Encounter for screening mammogram for malignant neoplasm of breast: Secondary | ICD-10-CM

## 2013-05-20 DIAGNOSIS — E785 Hyperlipidemia, unspecified: Secondary | ICD-10-CM | POA: Diagnosis not present

## 2013-05-20 DIAGNOSIS — J449 Chronic obstructive pulmonary disease, unspecified: Secondary | ICD-10-CM | POA: Diagnosis not present

## 2013-05-20 DIAGNOSIS — I10 Essential (primary) hypertension: Secondary | ICD-10-CM | POA: Diagnosis not present

## 2013-05-26 DIAGNOSIS — J449 Chronic obstructive pulmonary disease, unspecified: Secondary | ICD-10-CM | POA: Diagnosis not present

## 2013-05-26 DIAGNOSIS — J40 Bronchitis, not specified as acute or chronic: Secondary | ICD-10-CM | POA: Diagnosis not present

## 2013-05-26 DIAGNOSIS — Z96659 Presence of unspecified artificial knee joint: Secondary | ICD-10-CM | POA: Diagnosis not present

## 2013-06-17 ENCOUNTER — Ambulatory Visit
Admission: RE | Admit: 2013-06-17 | Discharge: 2013-06-17 | Disposition: A | Discharge status: Home / Self Care | Payer: Medicare Other | Source: Ambulatory Visit

## 2013-06-17 DIAGNOSIS — Z1231 Encounter for screening mammogram for malignant neoplasm of breast: Secondary | ICD-10-CM

## 2013-07-31 DIAGNOSIS — H01009 Unspecified blepharitis unspecified eye, unspecified eyelid: Secondary | ICD-10-CM | POA: Diagnosis not present

## 2013-08-31 DIAGNOSIS — E785 Hyperlipidemia, unspecified: Secondary | ICD-10-CM | POA: Diagnosis not present

## 2013-08-31 DIAGNOSIS — I10 Essential (primary) hypertension: Secondary | ICD-10-CM | POA: Diagnosis not present

## 2013-08-31 DIAGNOSIS — J449 Chronic obstructive pulmonary disease, unspecified: Secondary | ICD-10-CM | POA: Diagnosis not present

## 2013-08-31 DIAGNOSIS — M542 Cervicalgia: Secondary | ICD-10-CM | POA: Diagnosis not present

## 2013-09-08 DIAGNOSIS — J441 Chronic obstructive pulmonary disease with (acute) exacerbation: Secondary | ICD-10-CM | POA: Diagnosis not present

## 2013-09-08 DIAGNOSIS — R6889 Other general symptoms and signs: Secondary | ICD-10-CM | POA: Diagnosis not present

## 2013-10-08 ENCOUNTER — Ambulatory Visit: Payer: Self-pay | Admitting: Podiatrist

## 2013-10-14 ENCOUNTER — Ambulatory Visit (INDEPENDENT_AMBULATORY_CARE_PROVIDER_SITE_OTHER): Payer: Medicare Other | Admitting: Podiatrist

## 2013-10-14 ENCOUNTER — Encounter: Payer: Self-pay | Admitting: Podiatrist

## 2013-10-14 VITALS — BP 168/72 | HR 99 | Resp 16 | Ht 59.0 in | Wt 190.0 lb

## 2013-10-14 DIAGNOSIS — L608 Other nail disorders: Secondary | ICD-10-CM

## 2013-10-14 DIAGNOSIS — L602 Onychogryphosis: Secondary | ICD-10-CM

## 2013-10-14 NOTE — Progress Notes (Signed)
   Subjective:    Patient ID: [name], female    DOB: [birth year], 80 y.o.   MRN: [id number]  HPI Comments: "I need my toenails cut"  Patient states she needs her toenails cut. Unable to do it anymore due to hip problem. She says they are NOT painful. She used to go to [employer], but our location is closer for her.  Patient relates that she does not have pain from the toenails and is unable to reach them do to her hip problem. She relates no ingrown toenails, no history of infection or ulceration to the feet. She relates her sensation is fine and she denies any numbness or tingling to her feet.   Review of Systems  Respiratory: Positive for cough and shortness of breath.   Musculoskeletal: Positive for arthralgias.  All other systems reviewed and are negative.       Objective:   Physical Exam Vascular exam reveals palpable pedal pulses at 2/4 DP and PT bilateral. Capillary refill time is within normal limits bilateral. Neurological examination: full examination not carried out however patient subjectively relates no tingling or numbness present. Musculoskeletal examination reveals a rectus foot type intrinsic musculature intact Dermatological examination reveals elongated, non-dystrophic, non-tender, non-painful, non-symptomatic toenails 1 through 5 bilaterally. No sign of infection present no sign of ingrown toenail deformity present.    Assessment & Plan:  Non-dystrophic and non-mycotic toenails in the absence of pain, not diabetic, no circulation abnormality  Plan: I discussed with the patient that I would be more than happy to trim her toenails for her however due to the fact that she is nondiabetic, is not in pain, and has no circulation problems this would be a non-covered service through her insurance and therefore it would be an out-of-pocket charge for her.  The patient declined treatment and stated that she would go back to her previous podiatrist where her insurance  would pay for her debridements. No debridement carried out at today's visit.

## 2013-10-16 DIAGNOSIS — L608 Other nail disorders: Secondary | ICD-10-CM | POA: Diagnosis not present

## 2013-10-16 DIAGNOSIS — I739 Peripheral vascular disease, unspecified: Secondary | ICD-10-CM | POA: Diagnosis not present

## 2013-10-16 DIAGNOSIS — L84 Corns and callosities: Secondary | ICD-10-CM | POA: Diagnosis not present

## 2013-11-18 DIAGNOSIS — E785 Hyperlipidemia, unspecified: Secondary | ICD-10-CM | POA: Diagnosis not present

## 2013-11-18 DIAGNOSIS — I10 Essential (primary) hypertension: Secondary | ICD-10-CM | POA: Diagnosis not present

## 2013-11-18 DIAGNOSIS — J449 Chronic obstructive pulmonary disease, unspecified: Secondary | ICD-10-CM | POA: Diagnosis not present

## 2013-12-23 DIAGNOSIS — Z961 Presence of intraocular lens: Secondary | ICD-10-CM | POA: Diagnosis not present

## 2013-12-23 DIAGNOSIS — H524 Presbyopia: Secondary | ICD-10-CM | POA: Diagnosis not present

## 2014-05-19 ENCOUNTER — Other Ambulatory Visit: Payer: Self-pay

## 2014-05-19 DIAGNOSIS — Z1239 Encounter for other screening for malignant neoplasm of breast: Secondary | ICD-10-CM

## 2014-05-19 DIAGNOSIS — Z1231 Encounter for screening mammogram for malignant neoplasm of breast: Secondary | ICD-10-CM

## 2014-06-09 DIAGNOSIS — I10 Essential (primary) hypertension: Secondary | ICD-10-CM | POA: Diagnosis not present

## 2014-06-09 DIAGNOSIS — M899 Disorder of bone, unspecified: Secondary | ICD-10-CM | POA: Diagnosis not present

## 2014-06-09 DIAGNOSIS — E780 Pure hypercholesterolemia: Secondary | ICD-10-CM | POA: Diagnosis not present

## 2014-06-09 DIAGNOSIS — J449 Chronic obstructive pulmonary disease, unspecified: Secondary | ICD-10-CM | POA: Diagnosis not present

## 2014-06-09 DIAGNOSIS — Z23 Encounter for immunization: Secondary | ICD-10-CM | POA: Diagnosis not present

## 2014-06-09 DIAGNOSIS — M545 Low back pain: Secondary | ICD-10-CM | POA: Diagnosis not present

## 2014-06-09 DIAGNOSIS — M1990 Unspecified osteoarthritis, unspecified site: Secondary | ICD-10-CM | POA: Diagnosis not present

## 2014-06-09 DIAGNOSIS — J309 Allergic rhinitis, unspecified: Secondary | ICD-10-CM | POA: Diagnosis not present

## 2014-06-18 ENCOUNTER — Ambulatory Visit
Admission: RE | Admit: 2014-06-18 | Discharge: 2014-06-18 | Disposition: A | Discharge status: Home / Self Care | Payer: Medicare Other | Source: Ambulatory Visit

## 2014-06-18 DIAGNOSIS — Z1231 Encounter for screening mammogram for malignant neoplasm of breast: Secondary | ICD-10-CM | POA: Diagnosis not present

## 2014-08-18 DIAGNOSIS — M1990 Unspecified osteoarthritis, unspecified site: Secondary | ICD-10-CM | POA: Diagnosis not present

## 2014-08-18 DIAGNOSIS — J449 Chronic obstructive pulmonary disease, unspecified: Secondary | ICD-10-CM | POA: Diagnosis not present

## 2014-08-18 DIAGNOSIS — I10 Essential (primary) hypertension: Secondary | ICD-10-CM | POA: Diagnosis not present

## 2014-08-25 ENCOUNTER — Ambulatory Visit (INDEPENDENT_AMBULATORY_CARE_PROVIDER_SITE_OTHER): Payer: Medicare Other | Admitting: Interventional Cardiology

## 2014-08-25 ENCOUNTER — Encounter: Payer: Self-pay | Admitting: Interventional Cardiology

## 2014-08-25 VITALS — BP 140/90 | HR 86 | Ht 59.0 in | Wt 201.0 lb

## 2014-08-25 DIAGNOSIS — I10 Essential (primary) hypertension: Secondary | ICD-10-CM | POA: Diagnosis not present

## 2014-08-25 DIAGNOSIS — J449 Chronic obstructive pulmonary disease, unspecified: Secondary | ICD-10-CM | POA: Insufficient documentation

## 2014-08-25 DIAGNOSIS — R0600 Dyspnea, unspecified: Secondary | ICD-10-CM | POA: Diagnosis not present

## 2014-08-25 NOTE — Progress Notes (Signed)
Patient ID: [name], female   DOB: 1932/10/12, 81 y.o.   MRN: [id number] .    [address], [address] [place], [place]  [address] Phone: [phone] Fax:  [phone]  Date:  08/25/2014   ID:  [name], DOB [birth year], MRN [id number]  PCP:  Shirline Frees, MD   ASSESSMENT:  1. Cough 2. Hypertension, essential with poor control 3. Chronic exertional dyspnea, multifactorial related to diastolic dysfunction, obesity, COPD and physical deconditioning  PLAN:  1. Tighter blood pressure control. Consider resuming diuretic therapy. 2. If cough continues, consider pulmonary consult. I do not believe that the cough is related to her heart   SUBJECTIVE: [name] is a 81 y.o. female whom I saw years ago. At that time she had a fairly extensive cardiac evaluation including treadmill testing, and an echocardiogram. We found significant blood pressure elevation with walking. Low-dose beta blocker therapy was started to blood blood pressure response with activity. She is currently still on metoprolol succinate 25 mg per day. I believe that that time she was on a diuretic, which is subsequently been discontinued. She denies orthopnea, PND, lower extremity swelling, chest pain, productive cough, wheezing, and PND. The cough occurs at random.   Wt Readings from Last 3 Encounters:  08/25/14 201 lb (91.173 kg)  10/14/13 190 lb (86.183 kg)     Past Medical History  Diagnosis Date  . Hypertension   . Hyperlipidemia   . Thyroid cyst   . Colon polyp   . Shingles   . Osteopenia   . COPD (chronic obstructive pulmonary disease)     MILD  . OA (osteoarthritis)     OF THE KNEES    Current Outpatient Prescriptions  Medication Sig Dispense Refill  . aspirin 81 MG tablet Take 81 mg by mouth daily.    . Cholecalciferol (VITAMIN D PO) Take by mouth.    . metoprolol succinate (TOPROL-XL) 50 MG 24 hr tablet Take 50 mg by mouth daily. Take with or immediately following a meal.    .  Multiple Vitamins-Minerals (CENTRUM SILVER PO) Take by mouth.    . Omega-3 Fatty Acids (FISH OIL) 1000 MG CAPS Take by mouth.    . simvastatin (ZOCOR) 5 MG tablet Take 5 mg by mouth daily.    . Tiotropium Bromide Monohydrate 2.5 MCG/ACT AERS Inhale into the lungs daily.     No current facility-administered medications for this visit.    Allergies:    Allergies  Allergen Reactions  . Penicillins Rash    Social History:  The patient   lives alone. She doesn't smoke. She was a prior smoker. She denies alcohol. She still works at age 66. 2 days a week at [employer]:  Please see the history of present illness.   No edema. No abdominal swelling.   All other systems reviewed and negative.   OBJECTIVE: VS:  BP 140/90 mmHg  Pulse 86  Ht 4\' 11"  (1.499 m)  Wt 201 lb (91.173 kg)  BMI 40.58 kg/m2 repeat blood pressure 160/100 mmHg Well nourished, well developed, in no acute distress, obese HEENT: normal Neck: JVD flat. Carotid bruit absent  Cardiac:  normal S1, S2; RRR; no murmur Lungs:  clear to auscultation bilaterally, no wheezing, rhonchi or rales Abd: soft, nontender, no hepatomegaly Ext: Edema absent. Pulses 2+ Skin: warm and dry Neuro:  CNs 2-12 intact, no focal abnormalities noted  EKG:  Normal sinus rhythm, nonspecific ST-T wave abnormality.  Signed, Illene Labrador III, MD 08/25/2014 8:58 AM

## 2014-08-25 NOTE — Patient Instructions (Signed)
Your physician recommends that you continue on your current medications as directed. Please refer to the Current Medication list given to you today.  Your physician recommends that you schedule a follow-up appointment with Dr.Harris for management of your hypertension  Your physician recommends that you schedule a follow-up appointment with Dr.Smith as needed

## 2014-12-09 DIAGNOSIS — I10 Essential (primary) hypertension: Secondary | ICD-10-CM | POA: Diagnosis not present

## 2014-12-09 DIAGNOSIS — J449 Chronic obstructive pulmonary disease, unspecified: Secondary | ICD-10-CM | POA: Diagnosis not present

## 2014-12-09 DIAGNOSIS — E780 Pure hypercholesterolemia: Secondary | ICD-10-CM | POA: Diagnosis not present

## 2014-12-16 DIAGNOSIS — J441 Chronic obstructive pulmonary disease with (acute) exacerbation: Secondary | ICD-10-CM | POA: Diagnosis not present

## 2015-01-05 DIAGNOSIS — H04123 Dry eye syndrome of bilateral lacrimal glands: Secondary | ICD-10-CM | POA: Diagnosis not present

## 2015-01-11 DIAGNOSIS — J441 Chronic obstructive pulmonary disease with (acute) exacerbation: Secondary | ICD-10-CM | POA: Diagnosis not present

## 2015-01-12 DIAGNOSIS — Z471 Aftercare following joint replacement surgery: Secondary | ICD-10-CM | POA: Diagnosis not present

## 2015-01-12 DIAGNOSIS — Z96642 Presence of left artificial hip joint: Secondary | ICD-10-CM | POA: Diagnosis not present

## 2015-05-18 DIAGNOSIS — Z1389 Encounter for screening for other disorder: Secondary | ICD-10-CM | POA: Diagnosis not present

## 2015-05-18 DIAGNOSIS — J449 Chronic obstructive pulmonary disease, unspecified: Secondary | ICD-10-CM | POA: Diagnosis not present

## 2015-05-18 DIAGNOSIS — I10 Essential (primary) hypertension: Secondary | ICD-10-CM | POA: Diagnosis not present

## 2015-05-18 DIAGNOSIS — E785 Hyperlipidemia, unspecified: Secondary | ICD-10-CM | POA: Diagnosis not present

## 2015-05-20 ENCOUNTER — Other Ambulatory Visit: Payer: Self-pay

## 2015-05-20 DIAGNOSIS — Z1231 Encounter for screening mammogram for malignant neoplasm of breast: Secondary | ICD-10-CM

## 2015-06-23 ENCOUNTER — Ambulatory Visit
Admission: RE | Admit: 2015-06-23 | Discharge: 2015-06-23 | Disposition: A | Discharge status: Home / Self Care | Payer: Medicare Other | Source: Ambulatory Visit

## 2015-06-23 DIAGNOSIS — Z1231 Encounter for screening mammogram for malignant neoplasm of breast: Secondary | ICD-10-CM

## 2015-07-27 DIAGNOSIS — M5136 Other intervertebral disc degeneration, lumbar region: Secondary | ICD-10-CM | POA: Diagnosis not present

## 2015-07-27 DIAGNOSIS — M545 Low back pain: Secondary | ICD-10-CM | POA: Diagnosis not present

## 2015-08-11 DIAGNOSIS — J441 Chronic obstructive pulmonary disease with (acute) exacerbation: Secondary | ICD-10-CM | POA: Diagnosis not present

## 2015-11-16 DIAGNOSIS — I10 Essential (primary) hypertension: Secondary | ICD-10-CM | POA: Diagnosis not present

## 2015-11-16 DIAGNOSIS — M545 Low back pain: Secondary | ICD-10-CM | POA: Diagnosis not present

## 2015-11-16 DIAGNOSIS — J449 Chronic obstructive pulmonary disease, unspecified: Secondary | ICD-10-CM | POA: Diagnosis not present

## 2015-11-16 DIAGNOSIS — E7800 Pure hypercholesterolemia, unspecified: Secondary | ICD-10-CM | POA: Diagnosis not present

## 2016-01-05 DIAGNOSIS — Z96642 Presence of left artificial hip joint: Secondary | ICD-10-CM | POA: Diagnosis not present

## 2016-01-05 DIAGNOSIS — Z471 Aftercare following joint replacement surgery: Secondary | ICD-10-CM | POA: Diagnosis not present

## 2016-01-26 DIAGNOSIS — Z961 Presence of intraocular lens: Secondary | ICD-10-CM | POA: Diagnosis not present

## 2016-04-10 DIAGNOSIS — D1724 Benign lipomatous neoplasm of skin and subcutaneous tissue of left leg: Secondary | ICD-10-CM | POA: Diagnosis not present

## 2016-04-10 DIAGNOSIS — M7989 Other specified soft tissue disorders: Secondary | ICD-10-CM | POA: Diagnosis not present

## 2016-04-10 DIAGNOSIS — M545 Low back pain: Secondary | ICD-10-CM | POA: Diagnosis not present

## 2016-04-10 DIAGNOSIS — L609 Nail disorder, unspecified: Secondary | ICD-10-CM | POA: Diagnosis not present

## 2016-04-19 ENCOUNTER — Ambulatory Visit: Payer: Medicare Other | Attending: Family Medicine | Admitting: Physical Therapy

## 2016-04-19 ENCOUNTER — Encounter: Payer: Self-pay | Admitting: Physical Therapy

## 2016-04-19 DIAGNOSIS — M62830 Muscle spasm of back: Secondary | ICD-10-CM | POA: Diagnosis not present

## 2016-04-19 DIAGNOSIS — M545 Low back pain, unspecified: Secondary | ICD-10-CM

## 2016-04-19 DIAGNOSIS — M25652 Stiffness of left hip, not elsewhere classified: Secondary | ICD-10-CM | POA: Diagnosis not present

## 2016-04-20 NOTE — Therapy (Addendum)
[place], [place], [address] Phone: [phone]   Fax:  [phone]  Physical Therapy Evaluation  Patient Details  Name: BRONDA ALFRED MRN: [id number] Date of Birth: Mar 25, 1933 Referring Provider: Dr Harlan Stains  Encounter Date: 04/19/2016      PT End of Session - 04/19/16 0947    Visit Number 1   Number of Visits 12   Date for PT Re-Evaluation 05/31/16   Authorization Type [employer] kx at 15 visits G-code at 3    PT Start Time 0845   PT Stop Time 0929   PT Time Calculation (min) 44 min   Activity Tolerance Patient tolerated treatment well   Behavior During Therapy [facility] for tasks assessed/performed      Past Medical History:  Diagnosis Date  . Colon polyp   . COPD (chronic obstructive pulmonary disease) (HCC)    MILD  . Hyperlipidemia   . Hypertension   . OA (osteoarthritis)    OF THE KNEES  . Osteopenia   . Shingles   . Thyroid cyst     History reviewed. No pertinent surgical history.  There were no vitals filed for this visit.       Subjective Assessment - 04/19/16 0856    Subjective Patient has been having bilateral pain that radiates into her hips and into her mid buttock area L=R. Her pain is worse when she sits for a period of time or when she stands in one place for a period of time. She has recently retired and feels like she is less active then she was.    Limitations Sitting;Standing   How long can you sit comfortably? < 1/2 hour    How long can you stand comfortably? <10 minutes    How long can you walk comfortably? limited community distance    Diagnostic tests x-ray: degeneration in lumbar spine per patient. Not in computer.    Currently in Pain? Yes   Pain Score 5    Pain Location Back   Pain Orientation Right;Left   Pain Descriptors / Indicators Aching   Pain Type Acute pain   Pain Radiating Towards into bilateral gluteals    Pain Onset More than a month ago   Pain  Frequency Intermittent   Aggravating Factors  standing, sitting, night time    Pain Relieving Factors bending over    Effect of Pain on Daily Activities pain into bilateral buttock             [facility] PT Assessment - 04/20/16 0001      Assessment   Medical Diagnosis Low back pain    Referring Provider Dr Harlan Stains   Hand Dominance Right   Next MD Visit No appointment yet    Prior Therapy rest      Precautions   Precautions None     Restrictions   Weight Bearing Restrictions No     Balance Screen   Has the patient fallen in the past 6 months No     Home Environment   Additional Comments Single level home      Prior Function   Level of Independence Independent     Cognition   Overall Cognitive Status Within Functional Limits for tasks assessed     Observation/Other Assessments   Observations mild obesity, flexed posture in standing;    Focus on Therapeutic Outcomes (FOTO)  52% limitation      Posture/Postural Control   Posture Comments Difficult to assess pelvis  2nd to soft tissue      AROM   AROM Assessment Site Lumbar   Lumbar Flexion No limit    Lumbar Extension limited 25%    Lumbar - Right Side Bend no limit   Lumbar - Left Side Bend no limit   Lumbar - Right Rotation slight pain on the left    Lumbar - Left Rotation slight pain on the right      PROM   Overall PROM Comments Hip flexion L to 90 degrees left ; Hip external rotation tight on the left      Palpation   Spinal mobility Difficult to assess 2nd to patients inability to lie on her stomach    Palpation comment mild spasming of bilateral paraspinals      Transfers   Comments sit to stand CGA at times it can take the patient 2x to stand.                    [place] Adult PT Treatment/Exercise - 04/20/16 0001      Lumbar Exercises: Stretches   Passive Hamstring Stretch Limitations seated 3x20sec    Single Knee to Chest Stretch Limitations seated 30 sec    Lower Trunk Rotation  Limitations seated 30 sec    Piriformis Stretch Limitations seated 3x30 sec                 PT Education - 04/19/16 0946    Education provided Yes   Education Details HEP, symptom management, prognosis    Person(s) Educated Patient   Methods Explanation;Demonstration;Handout   Comprehension Verbalized understanding;Verbal cues required          PT Short Term Goals - 04/20/16 1206      PT SHORT TERM GOAL #1   Title Patient will increase passive left hip flexion by 15 degrees without pain    Time 3   Period Weeks   Status New     PT SHORT TERM GOAL #2   Title Patient will demsotrate a good core contraction    Time 3   Period Weeks   Status New     PT SHORT TERM GOAL #3   Title Patient will be independent with HEP for core strength and stretching    Time 3   Period Weeks   Status New           PT Long Term Goals - 04/20/16 1208      PT LONG TERM GOAL #1   Title Patient will stand for 1 hour without increased pain    Time 6   Period Weeks   Status New     PT LONG TERM GOAL #2   Title Patient will stand from a chair without pain and without difficulty    Time 6   Period Weeks   Status New     PT LONG TERM GOAL #3   Title Patient will sit for 1 hour without increase lower back pain.    Time 6   Period Weeks   Status New               Plan - 04/20/16 1202    Clinical Impression Statement Patient is an 82 year old female with lower back pain that can radiate into her superior buttock area. Signs and symptoms are consistent with lumbar stenosis. She has improved pain when she flexes forward. She has increased pain when she sits for a period of time or when she stands. She  presents with tightness in her left hip and core weakness. Her LE strength is very good for her age. She was given a light stretching programfor her HEP. She will return in a few weeks and therapy will review strengthening. She can not come next week 2nd to personal reasons. She  was seen today for a low complexity evaluation.    Rehab Potential Good   PT Frequency 2x / week   PT Duration 6 weeks   PT Treatment/Interventions ADLs/Self Care Home Management;Gait training;Stair training;Patient/family education;Manual techniques;Dry needling;Vasopneumatic Device;Moist Heat;Ultrasound;Traction   PT Next Visit Plan give paitien  light core strengthening exercises; review HEP with patient   PT Home Exercise Plan lateral trunk rotations; piriformis stretch, seated hamstring stretch, single knee to chest stretch   Consulted and Agree with Plan of Care Patient      Patient will benefit from skilled therapeutic intervention in order to improve the following deficits and impairments:  Abnormal gait, Decreased cognition, Decreased strength, Increased edema, Impaired sensation, Decreased endurance, Impaired flexibility, Increased muscle spasms  Visit Diagnosis: Bilateral low back pain without sciatica - Plan: PT plan of care cert/re-cert  Stiffness of left hip, not elsewhere classified - Plan: PT plan of care cert/re-cert  Muscle spasm of back - Plan: PT plan of care cert/re-cert      G-Codes - 16/10/96 1004    Functional Limitation Mobility: Walking and moving around   Mobility: Walking and Moving Around Current Status [phone]) At least 20 percent but less than 40 percent impaired, limited or restricted   Mobility: Walking and Moving Around Goal Status [phone]) At least 1 percent but less than 20 percent impaired, limited or restricted     PHYSICAL THERAPY DISCHARGE SUMMARY  Visits from Start of Care: 1  Current functional level related to goals / functional outcomes: 1   Remaining deficits: Patient only came for 1 visit   Education / Equipment: Patient only came for 1 visit  Plan: Patient agrees to discharge.  Patient goals were not met. Patient is being discharged due to meeting the stated rehab goals.  ?????      Problem List Patient Active Problem List    Diagnosis Date Noted  . Essential hypertension 08/25/2014  . Dyspnea 08/25/2014  . Morbid obesity ([place]) 08/25/2014  . COPD (chronic obstructive pulmonary disease) ([place]) 08/25/2014    Carney Living PT DPT  04/20/2016, 12:22 PM  [facility] [address] [place], [place], [address] Phone: [phone]   Fax:  [phone]  Name: AUGUST GOSSER MRN: [id number] Date of Birth: Jan 17, 1933

## 2016-05-02 ENCOUNTER — Ambulatory Visit: Payer: Medicare Other | Admitting: Physical Therapy

## 2016-05-09 ENCOUNTER — Ambulatory Visit: Payer: Medicare Other | Admitting: Physical Therapy

## 2016-05-14 DIAGNOSIS — M545 Low back pain: Secondary | ICD-10-CM | POA: Diagnosis not present

## 2016-05-14 DIAGNOSIS — M170 Bilateral primary osteoarthritis of knee: Secondary | ICD-10-CM | POA: Diagnosis not present

## 2016-05-14 DIAGNOSIS — Z23 Encounter for immunization: Secondary | ICD-10-CM | POA: Diagnosis not present

## 2016-05-14 DIAGNOSIS — J449 Chronic obstructive pulmonary disease, unspecified: Secondary | ICD-10-CM | POA: Diagnosis not present

## 2016-05-14 DIAGNOSIS — E7800 Pure hypercholesterolemia, unspecified: Secondary | ICD-10-CM | POA: Diagnosis not present

## 2016-05-14 DIAGNOSIS — I10 Essential (primary) hypertension: Secondary | ICD-10-CM | POA: Diagnosis not present

## 2016-06-08 ENCOUNTER — Other Ambulatory Visit: Payer: Self-pay | Admitting: Family Medicine

## 2016-06-08 DIAGNOSIS — Z1231 Encounter for screening mammogram for malignant neoplasm of breast: Secondary | ICD-10-CM

## 2016-07-03 ENCOUNTER — Ambulatory Visit
Admission: RE | Admit: 2016-07-03 | Discharge: 2016-07-03 | Disposition: A | Discharge status: Home / Self Care | Payer: Medicare Other | Source: Ambulatory Visit | Attending: Family Medicine | Admitting: Family Medicine

## 2016-07-03 DIAGNOSIS — Z1231 Encounter for screening mammogram for malignant neoplasm of breast: Secondary | ICD-10-CM | POA: Diagnosis not present

## 2016-07-03 IMAGING — MG 2D DIGITAL SCREENING BILATERAL MAMMOGRAM WITH CAD AND ADJUNCT TO
1 series · 1 of 1 positions shown · non-contrast
Comparison: Previous exam(s).

CLINICAL DATA: Screening.

EXAM:
2D DIGITAL SCREENING BILATERAL MAMMOGRAM WITH CAD AND ADJUNCT TOMO

[R MLO]
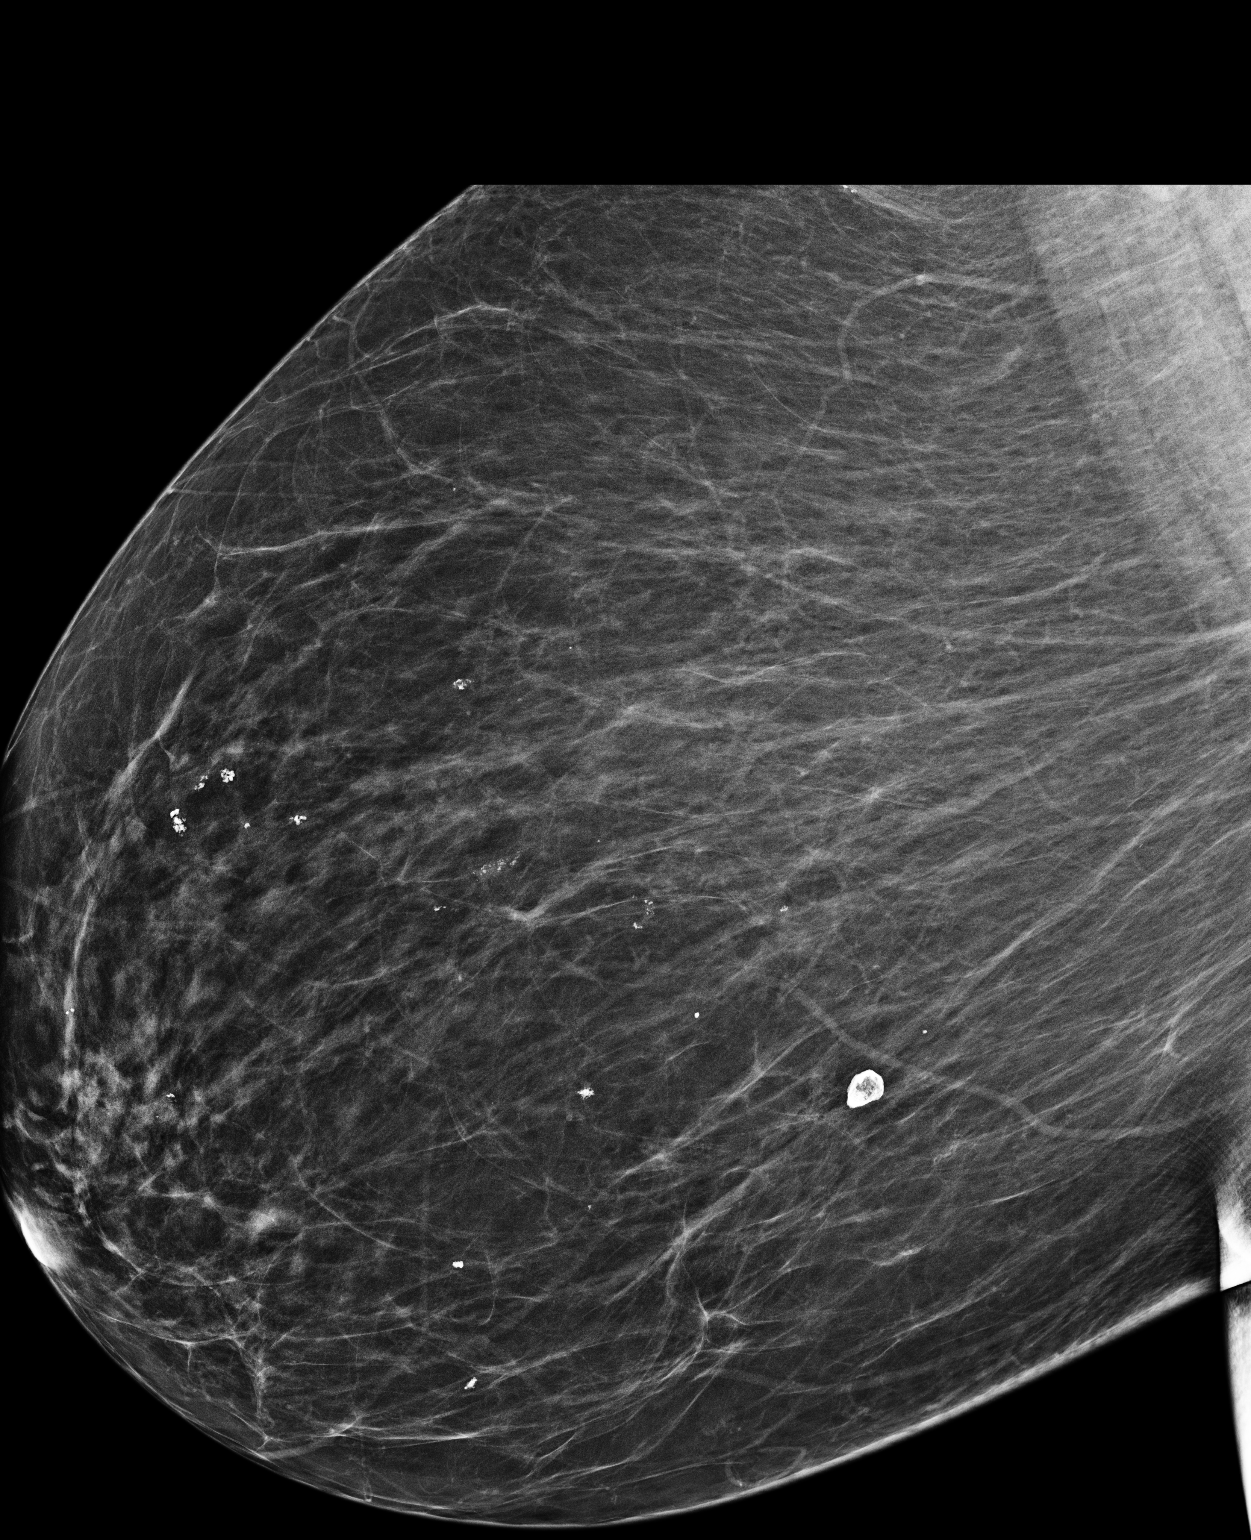

[1 of 1 positions shown; findings below may reference images not displayed]

ACR Breast Density Category b: There are scattered areas of
fibroglandular density.
FINDINGS: There are no findings suspicious for malignancy. Images were
processed with CAD.
IMPRESSION: No mammographic evidence of malignancy. A result letter of this
screening mammogram will be mailed directly to the patient.

RECOMMENDATION:
Screening mammogram in one year. (Code:[33])

BI-RADS CATEGORY  1: Negative.

## 2016-08-01 DIAGNOSIS — M5136 Other intervertebral disc degeneration, lumbar region: Secondary | ICD-10-CM | POA: Diagnosis not present

## 2016-08-16 ENCOUNTER — Other Ambulatory Visit: Payer: Self-pay | Admitting: Family Medicine

## 2016-08-16 ENCOUNTER — Ambulatory Visit
Admission: RE | Admit: 2016-08-16 | Discharge: 2016-08-16 | Disposition: A | Discharge status: Home / Self Care | Payer: Medicare Other | Source: Ambulatory Visit | Attending: Family Medicine | Admitting: Family Medicine

## 2016-08-16 DIAGNOSIS — R0600 Dyspnea, unspecified: Secondary | ICD-10-CM | POA: Diagnosis not present

## 2016-08-16 DIAGNOSIS — M5136 Other intervertebral disc degeneration, lumbar region: Secondary | ICD-10-CM | POA: Diagnosis not present

## 2016-08-16 DIAGNOSIS — R600 Localized edema: Secondary | ICD-10-CM

## 2016-08-16 IMAGING — CR DG CHEST 2V
2 series · 2 of 2 positions shown · non-contrast
Comparison: None.

CLINICAL DATA: Increased dyspnea x2 months with dyspnea on
exertion.

EXAM:
CHEST  2 VIEW

[w chest pa]
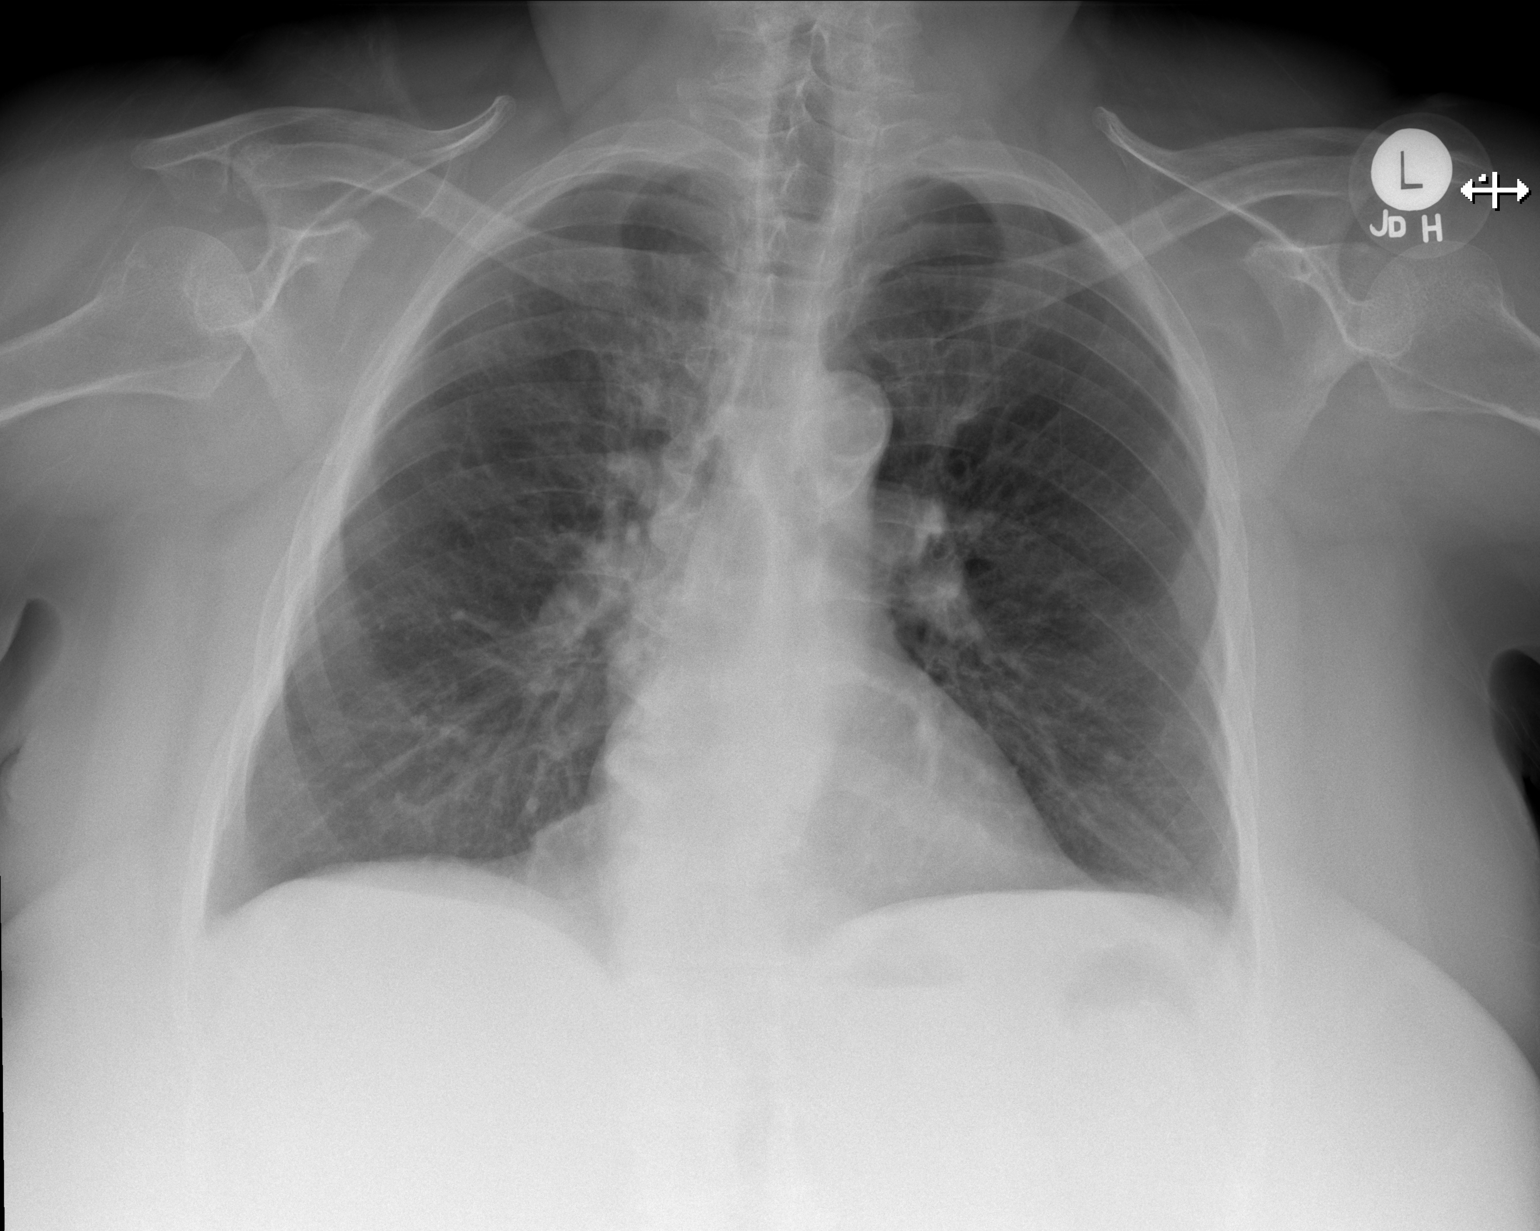

[w chest lat]
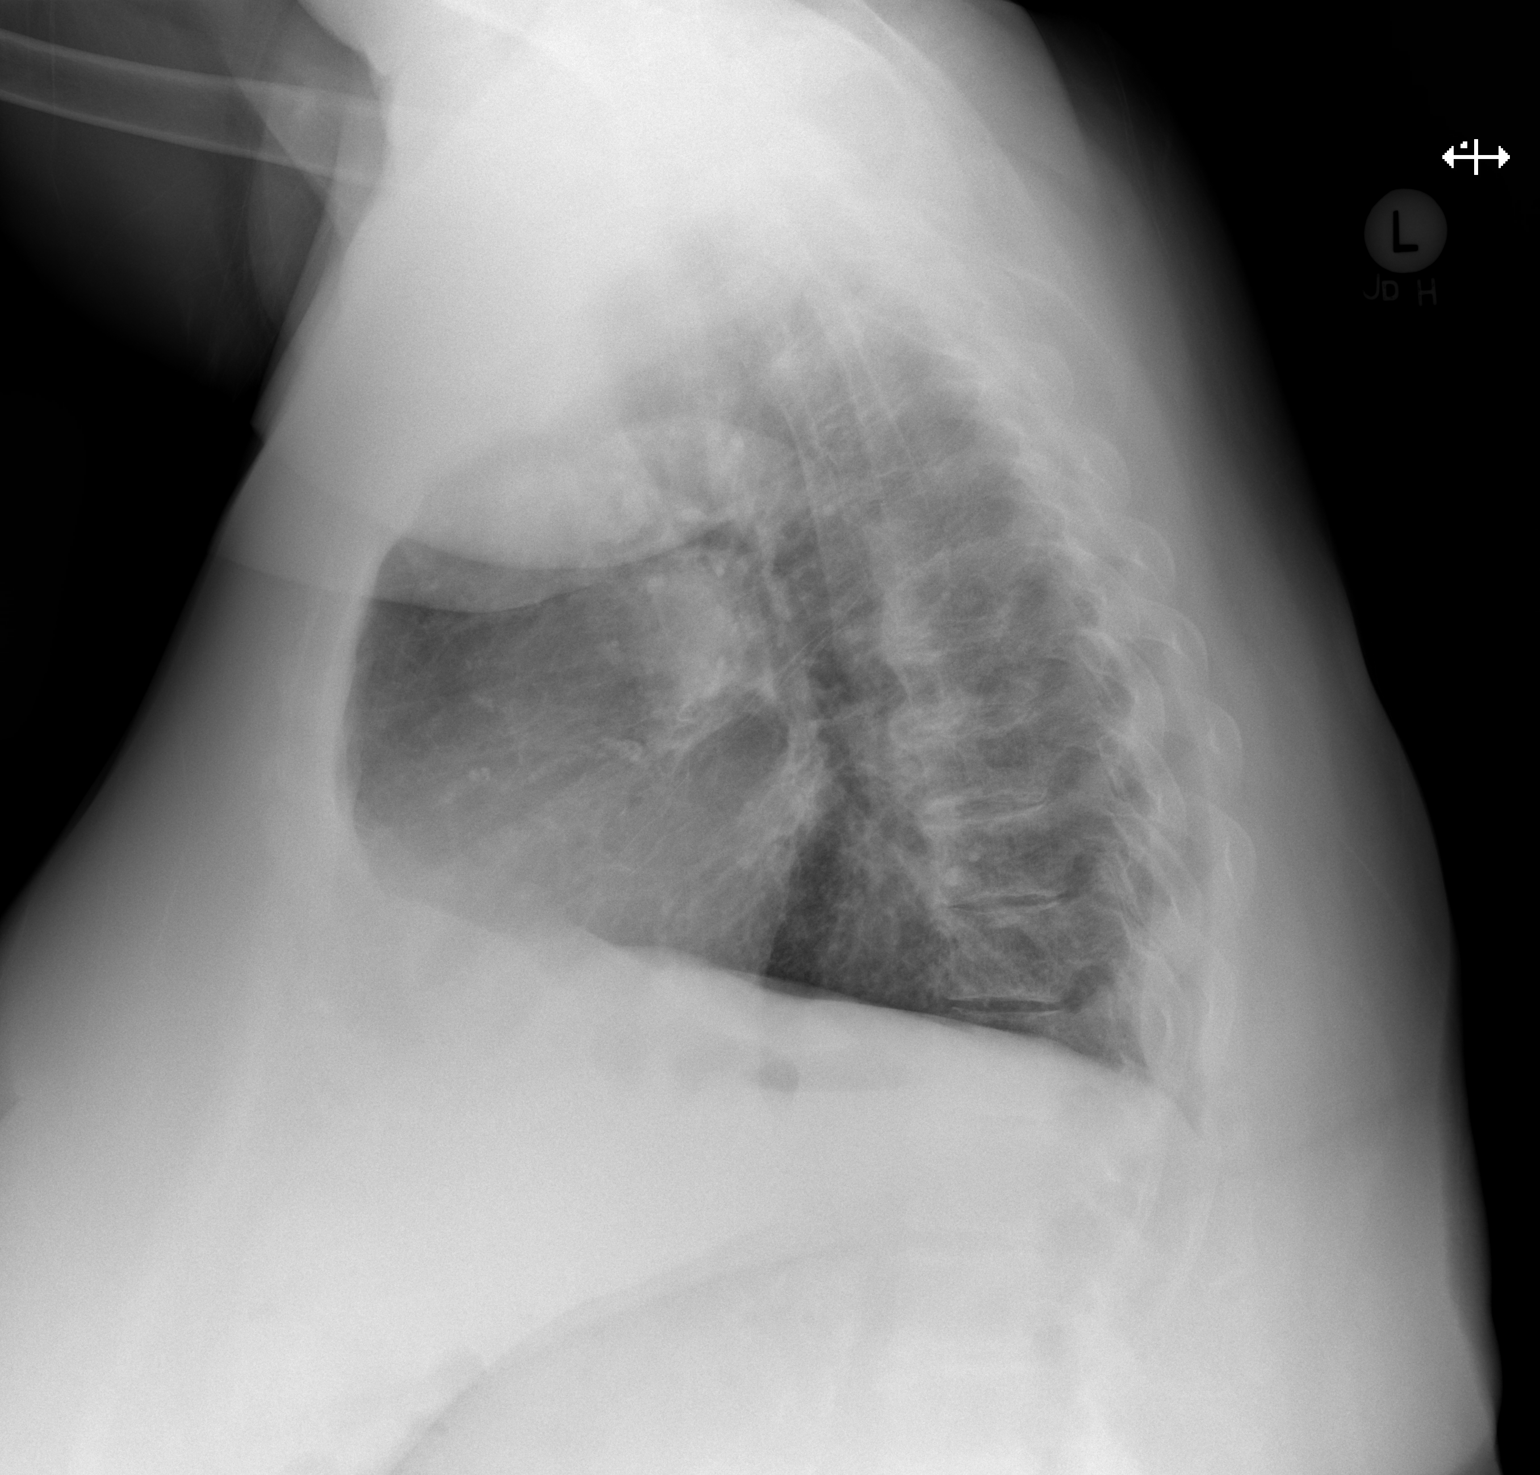

[2 of 2 positions shown; findings below may reference images not displayed]

FINDINGS: Heart size is normal. There is aortic atherosclerosis without
aneurysm. No pneumonic consolidation, effusion or pneumothorax.
Minimal atelectasis at the left lung base laterally. Probable
epicardial fat pad adjacent to the right heart border. Thoracic
spondylosis with osteophyte formation is noted with mild multilevel
disc space narrowing.
IMPRESSION: No active cardiopulmonary disease.  Aortic atherosclerosis.

## 2016-08-20 DIAGNOSIS — M5136 Other intervertebral disc degeneration, lumbar region: Secondary | ICD-10-CM | POA: Diagnosis not present

## 2016-09-03 DIAGNOSIS — R0602 Shortness of breath: Secondary | ICD-10-CM | POA: Diagnosis not present

## 2016-09-03 DIAGNOSIS — I10 Essential (primary) hypertension: Secondary | ICD-10-CM | POA: Diagnosis not present

## 2016-10-09 DIAGNOSIS — Z471 Aftercare following joint replacement surgery: Secondary | ICD-10-CM | POA: Diagnosis not present

## 2016-10-09 DIAGNOSIS — Z96642 Presence of left artificial hip joint: Secondary | ICD-10-CM | POA: Diagnosis not present

## 2016-10-09 DIAGNOSIS — M5136 Other intervertebral disc degeneration, lumbar region: Secondary | ICD-10-CM | POA: Diagnosis not present

## 2016-10-10 DIAGNOSIS — M545 Low back pain: Secondary | ICD-10-CM | POA: Diagnosis not present

## 2016-10-10 DIAGNOSIS — M5136 Other intervertebral disc degeneration, lumbar region: Secondary | ICD-10-CM | POA: Diagnosis not present

## 2016-10-13 DIAGNOSIS — M545 Low back pain: Secondary | ICD-10-CM | POA: Diagnosis not present

## 2016-10-19 DIAGNOSIS — M48062 Spinal stenosis, lumbar region with neurogenic claudication: Secondary | ICD-10-CM | POA: Diagnosis not present

## 2016-10-19 DIAGNOSIS — M545 Low back pain: Secondary | ICD-10-CM | POA: Diagnosis not present

## 2016-10-19 DIAGNOSIS — M5136 Other intervertebral disc degeneration, lumbar region: Secondary | ICD-10-CM | POA: Diagnosis not present

## 2016-10-26 DIAGNOSIS — M5440 Lumbago with sciatica, unspecified side: Secondary | ICD-10-CM | POA: Diagnosis not present

## 2016-11-02 DIAGNOSIS — M545 Low back pain: Secondary | ICD-10-CM | POA: Diagnosis not present

## 2016-11-02 DIAGNOSIS — M5137 Other intervertebral disc degeneration, lumbosacral region: Secondary | ICD-10-CM | POA: Diagnosis not present

## 2016-11-02 DIAGNOSIS — M5136 Other intervertebral disc degeneration, lumbar region: Secondary | ICD-10-CM | POA: Diagnosis not present

## 2016-11-12 DIAGNOSIS — Z6841 Body Mass Index (BMI) 40.0 and over, adult: Secondary | ICD-10-CM | POA: Diagnosis not present

## 2016-11-12 DIAGNOSIS — M545 Low back pain: Secondary | ICD-10-CM | POA: Diagnosis not present

## 2016-11-12 DIAGNOSIS — J449 Chronic obstructive pulmonary disease, unspecified: Secondary | ICD-10-CM | POA: Diagnosis not present

## 2016-11-12 DIAGNOSIS — E7800 Pure hypercholesterolemia, unspecified: Secondary | ICD-10-CM | POA: Diagnosis not present

## 2016-11-12 DIAGNOSIS — I10 Essential (primary) hypertension: Secondary | ICD-10-CM | POA: Diagnosis not present

## 2016-11-12 DIAGNOSIS — Z792 Long term (current) use of antibiotics: Secondary | ICD-10-CM | POA: Diagnosis not present

## 2016-11-20 DIAGNOSIS — M4316 Spondylolisthesis, lumbar region: Secondary | ICD-10-CM | POA: Diagnosis not present

## 2016-11-20 DIAGNOSIS — M48062 Spinal stenosis, lumbar region with neurogenic claudication: Secondary | ICD-10-CM | POA: Diagnosis not present

## 2016-11-23 DIAGNOSIS — M5137 Other intervertebral disc degeneration, lumbosacral region: Secondary | ICD-10-CM | POA: Diagnosis not present

## 2016-11-23 DIAGNOSIS — M545 Low back pain: Secondary | ICD-10-CM | POA: Diagnosis not present

## 2016-12-04 DIAGNOSIS — M4316 Spondylolisthesis, lumbar region: Secondary | ICD-10-CM | POA: Diagnosis not present

## 2016-12-04 DIAGNOSIS — M5136 Other intervertebral disc degeneration, lumbar region: Secondary | ICD-10-CM | POA: Diagnosis not present

## 2016-12-04 DIAGNOSIS — M48062 Spinal stenosis, lumbar region with neurogenic claudication: Secondary | ICD-10-CM | POA: Diagnosis not present

## 2016-12-13 DIAGNOSIS — R194 Change in bowel habit: Secondary | ICD-10-CM | POA: Diagnosis not present

## 2016-12-17 DIAGNOSIS — J449 Chronic obstructive pulmonary disease, unspecified: Secondary | ICD-10-CM | POA: Diagnosis not present

## 2016-12-17 DIAGNOSIS — J302 Other seasonal allergic rhinitis: Secondary | ICD-10-CM | POA: Diagnosis not present

## 2016-12-17 DIAGNOSIS — R350 Frequency of micturition: Secondary | ICD-10-CM | POA: Diagnosis not present

## 2017-01-28 DIAGNOSIS — M5416 Radiculopathy, lumbar region: Secondary | ICD-10-CM | POA: Diagnosis not present

## 2017-01-28 DIAGNOSIS — J449 Chronic obstructive pulmonary disease, unspecified: Secondary | ICD-10-CM | POA: Diagnosis not present

## 2017-01-28 DIAGNOSIS — M9983 Other biomechanical lesions of lumbar region: Secondary | ICD-10-CM | POA: Diagnosis not present

## 2017-01-28 DIAGNOSIS — R030 Elevated blood-pressure reading, without diagnosis of hypertension: Secondary | ICD-10-CM | POA: Diagnosis not present

## 2017-01-28 DIAGNOSIS — Z6841 Body Mass Index (BMI) 40.0 and over, adult: Secondary | ICD-10-CM | POA: Diagnosis not present

## 2017-01-28 DIAGNOSIS — M4316 Spondylolisthesis, lumbar region: Secondary | ICD-10-CM | POA: Diagnosis not present

## 2017-01-30 DIAGNOSIS — Z961 Presence of intraocular lens: Secondary | ICD-10-CM | POA: Diagnosis not present

## 2017-03-18 DIAGNOSIS — M5416 Radiculopathy, lumbar region: Secondary | ICD-10-CM | POA: Diagnosis not present

## 2017-04-16 DIAGNOSIS — E7800 Pure hypercholesterolemia, unspecified: Secondary | ICD-10-CM | POA: Diagnosis not present

## 2017-04-16 DIAGNOSIS — I10 Essential (primary) hypertension: Secondary | ICD-10-CM | POA: Diagnosis not present

## 2017-04-16 DIAGNOSIS — Z23 Encounter for immunization: Secondary | ICD-10-CM | POA: Diagnosis not present

## 2017-04-16 DIAGNOSIS — M170 Bilateral primary osteoarthritis of knee: Secondary | ICD-10-CM | POA: Diagnosis not present

## 2017-05-20 ENCOUNTER — Other Ambulatory Visit: Payer: Self-pay | Admitting: Family Medicine

## 2017-05-20 DIAGNOSIS — Z1231 Encounter for screening mammogram for malignant neoplasm of breast: Secondary | ICD-10-CM

## 2017-05-22 DIAGNOSIS — Z6839 Body mass index (BMI) 39.0-39.9, adult: Secondary | ICD-10-CM | POA: Diagnosis not present

## 2017-05-22 DIAGNOSIS — M9983 Other biomechanical lesions of lumbar region: Secondary | ICD-10-CM | POA: Diagnosis not present

## 2017-05-22 DIAGNOSIS — M4316 Spondylolisthesis, lumbar region: Secondary | ICD-10-CM | POA: Diagnosis not present

## 2017-05-22 DIAGNOSIS — R030 Elevated blood-pressure reading, without diagnosis of hypertension: Secondary | ICD-10-CM | POA: Diagnosis not present

## 2017-05-22 DIAGNOSIS — M5416 Radiculopathy, lumbar region: Secondary | ICD-10-CM | POA: Diagnosis not present

## 2017-05-25 DIAGNOSIS — J440 Chronic obstructive pulmonary disease with acute lower respiratory infection: Secondary | ICD-10-CM | POA: Diagnosis not present

## 2017-05-25 DIAGNOSIS — J069 Acute upper respiratory infection, unspecified: Secondary | ICD-10-CM | POA: Diagnosis not present

## 2017-05-31 DIAGNOSIS — J209 Acute bronchitis, unspecified: Secondary | ICD-10-CM | POA: Diagnosis not present

## 2017-05-31 DIAGNOSIS — I10 Essential (primary) hypertension: Secondary | ICD-10-CM | POA: Diagnosis not present

## 2017-07-09 ENCOUNTER — Ambulatory Visit
Admission: RE | Admit: 2017-07-09 | Discharge: 2017-07-09 | Disposition: A | Discharge status: Home / Self Care | Payer: Medicare Other | Source: Ambulatory Visit | Attending: Family Medicine | Admitting: Family Medicine

## 2017-07-09 DIAGNOSIS — Z1231 Encounter for screening mammogram for malignant neoplasm of breast: Secondary | ICD-10-CM | POA: Diagnosis not present

## 2017-08-02 DIAGNOSIS — M5416 Radiculopathy, lumbar region: Secondary | ICD-10-CM | POA: Diagnosis not present

## 2017-08-02 DIAGNOSIS — M4316 Spondylolisthesis, lumbar region: Secondary | ICD-10-CM | POA: Diagnosis not present

## 2017-10-21 DIAGNOSIS — M545 Low back pain: Secondary | ICD-10-CM | POA: Diagnosis not present

## 2017-10-21 DIAGNOSIS — E7800 Pure hypercholesterolemia, unspecified: Secondary | ICD-10-CM | POA: Diagnosis not present

## 2017-10-21 DIAGNOSIS — I10 Essential (primary) hypertension: Secondary | ICD-10-CM | POA: Diagnosis not present

## 2017-10-21 DIAGNOSIS — Z792 Long term (current) use of antibiotics: Secondary | ICD-10-CM | POA: Diagnosis not present

## 2017-10-21 DIAGNOSIS — Z6837 Body mass index (BMI) 37.0-37.9, adult: Secondary | ICD-10-CM | POA: Diagnosis not present

## 2017-11-19 DIAGNOSIS — M4316 Spondylolisthesis, lumbar region: Secondary | ICD-10-CM | POA: Diagnosis not present

## 2017-11-19 DIAGNOSIS — M5416 Radiculopathy, lumbar region: Secondary | ICD-10-CM | POA: Diagnosis not present

## 2017-12-25 DIAGNOSIS — M5416 Radiculopathy, lumbar region: Secondary | ICD-10-CM | POA: Diagnosis not present

## 2017-12-25 DIAGNOSIS — M4316 Spondylolisthesis, lumbar region: Secondary | ICD-10-CM | POA: Diagnosis not present

## 2018-01-02 DIAGNOSIS — Z1389 Encounter for screening for other disorder: Secondary | ICD-10-CM | POA: Diagnosis not present

## 2018-01-02 DIAGNOSIS — I499 Cardiac arrhythmia, unspecified: Secondary | ICD-10-CM | POA: Diagnosis not present

## 2018-04-28 DIAGNOSIS — H6123 Impacted cerumen, bilateral: Secondary | ICD-10-CM | POA: Diagnosis not present

## 2018-04-28 DIAGNOSIS — M545 Low back pain: Secondary | ICD-10-CM | POA: Diagnosis not present

## 2018-04-28 DIAGNOSIS — M170 Bilateral primary osteoarthritis of knee: Secondary | ICD-10-CM | POA: Diagnosis not present

## 2018-04-28 DIAGNOSIS — I10 Essential (primary) hypertension: Secondary | ICD-10-CM | POA: Diagnosis not present

## 2018-04-28 DIAGNOSIS — Z961 Presence of intraocular lens: Secondary | ICD-10-CM | POA: Diagnosis not present

## 2018-04-28 DIAGNOSIS — E7800 Pure hypercholesterolemia, unspecified: Secondary | ICD-10-CM | POA: Diagnosis not present

## 2018-05-12 DIAGNOSIS — M545 Low back pain, unspecified: Secondary | ICD-10-CM | POA: Insufficient documentation

## 2018-05-12 DIAGNOSIS — M4310 Spondylolisthesis, site unspecified: Secondary | ICD-10-CM | POA: Diagnosis not present

## 2018-05-12 DIAGNOSIS — M48061 Spinal stenosis, lumbar region without neurogenic claudication: Secondary | ICD-10-CM | POA: Diagnosis not present

## 2018-05-12 DIAGNOSIS — Z6841 Body Mass Index (BMI) 40.0 and over, adult: Secondary | ICD-10-CM | POA: Diagnosis not present

## 2018-05-12 DIAGNOSIS — M4316 Spondylolisthesis, lumbar region: Secondary | ICD-10-CM | POA: Insufficient documentation

## 2018-05-12 DIAGNOSIS — M5136 Other intervertebral disc degeneration, lumbar region: Secondary | ICD-10-CM | POA: Diagnosis not present

## 2018-05-23 DIAGNOSIS — M25512 Pain in left shoulder: Secondary | ICD-10-CM | POA: Diagnosis not present

## 2018-05-23 DIAGNOSIS — Z23 Encounter for immunization: Secondary | ICD-10-CM | POA: Diagnosis not present

## 2018-06-10 ENCOUNTER — Other Ambulatory Visit: Payer: Self-pay | Admitting: Family Medicine

## 2018-06-10 DIAGNOSIS — Z1231 Encounter for screening mammogram for malignant neoplasm of breast: Secondary | ICD-10-CM

## 2018-07-18 DIAGNOSIS — J209 Acute bronchitis, unspecified: Secondary | ICD-10-CM | POA: Diagnosis not present

## 2018-07-21 ENCOUNTER — Ambulatory Visit: Payer: Medicare Other

## 2018-07-28 DIAGNOSIS — R0602 Shortness of breath: Secondary | ICD-10-CM | POA: Diagnosis not present

## 2018-07-28 DIAGNOSIS — R609 Edema, unspecified: Secondary | ICD-10-CM | POA: Diagnosis not present

## 2018-07-28 DIAGNOSIS — J441 Chronic obstructive pulmonary disease with (acute) exacerbation: Secondary | ICD-10-CM | POA: Diagnosis not present

## 2018-09-01 ENCOUNTER — Ambulatory Visit: Payer: Medicare Other

## 2018-09-15 ENCOUNTER — Ambulatory Visit
Admission: RE | Admit: 2018-09-15 | Discharge: 2018-09-15 | Disposition: A | Discharge status: Home / Self Care | Payer: Medicare Other | Source: Ambulatory Visit | Attending: Family Medicine | Admitting: Family Medicine

## 2018-09-15 DIAGNOSIS — Z1231 Encounter for screening mammogram for malignant neoplasm of breast: Secondary | ICD-10-CM | POA: Diagnosis not present

## 2018-09-15 IMAGING — MG DIGITAL SCREENING BILATERAL MAMMOGRAM WITH TOMO AND CAD
6 of 10 series · 6 of 30 positions shown · non-contrast
Comparison: Previous exam(s).

CLINICAL DATA: Screening.

EXAM:
DIGITAL SCREENING BILATERAL MAMMOGRAM WITH TOMO AND CAD

[R MLO synth-2D (1 of 2)]
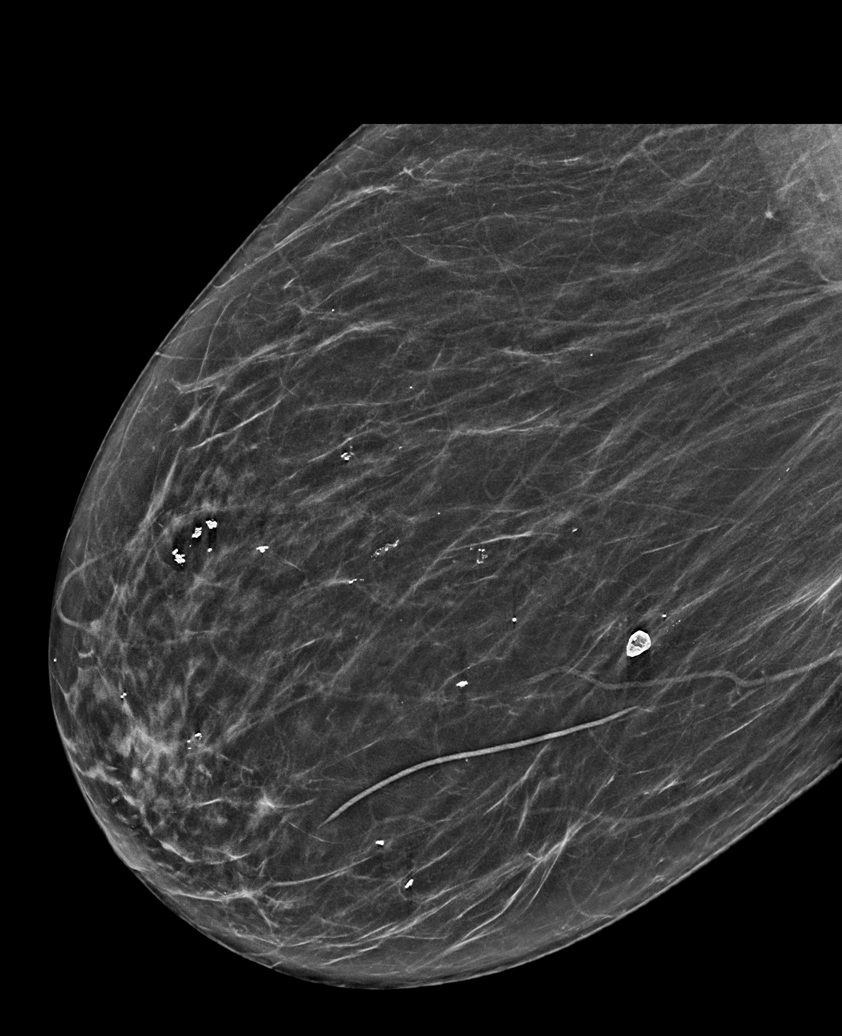

[L CC synth-2D]
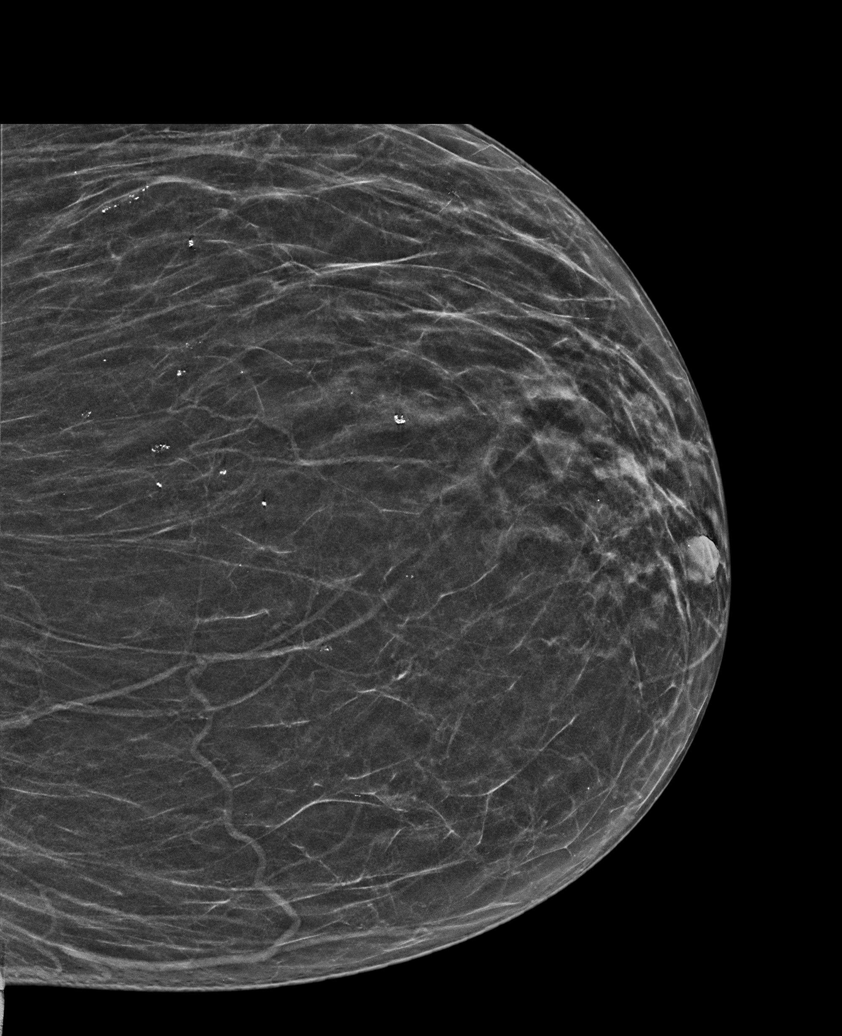

[R MLO synth-2D (2 of 2)]
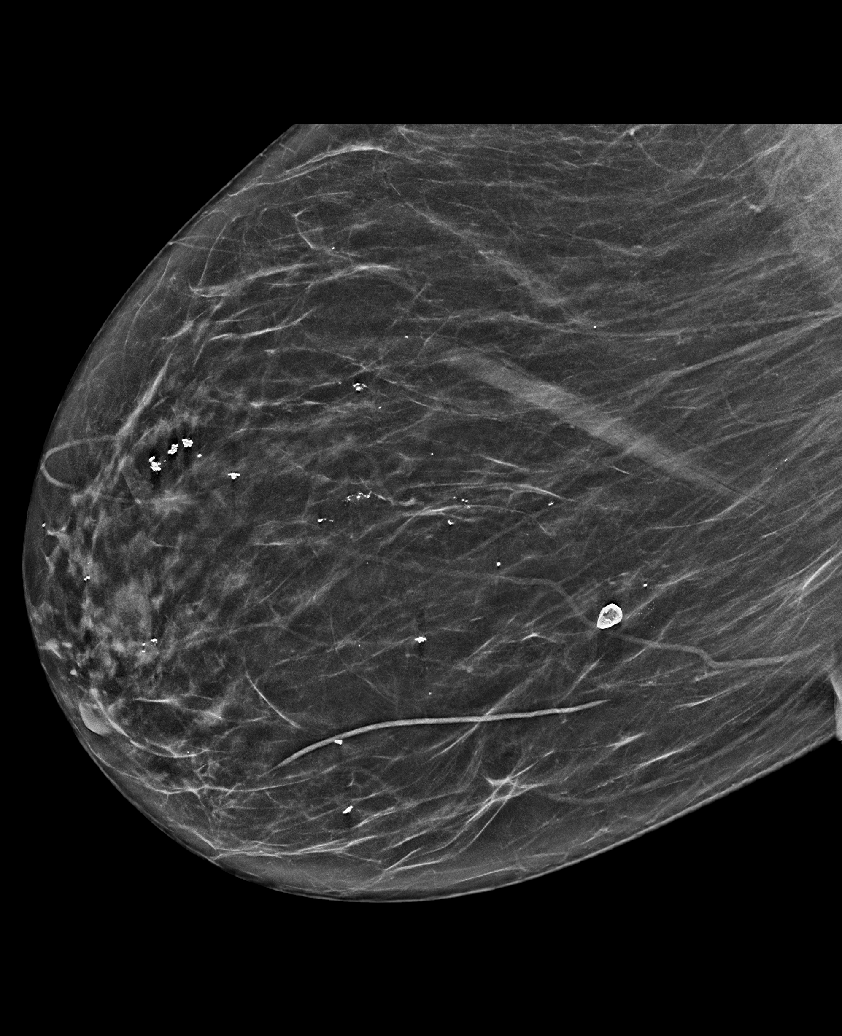

[R CC synth-2D]
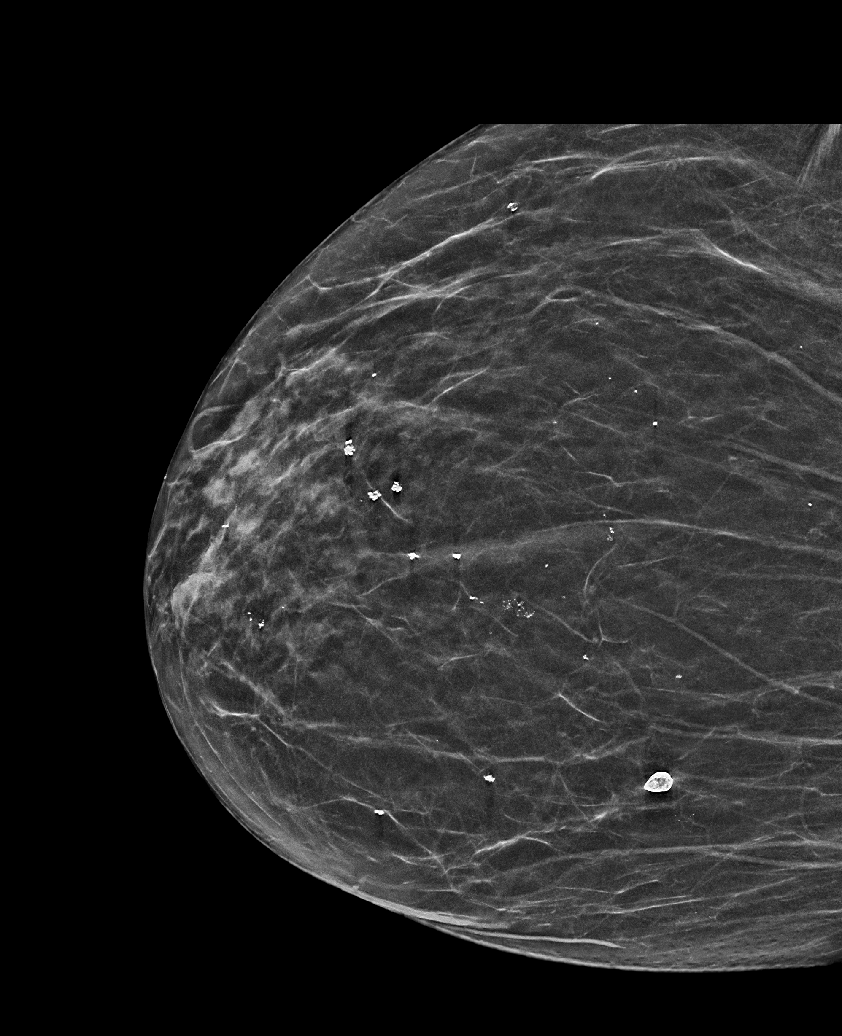

[L MLO synth-2D]
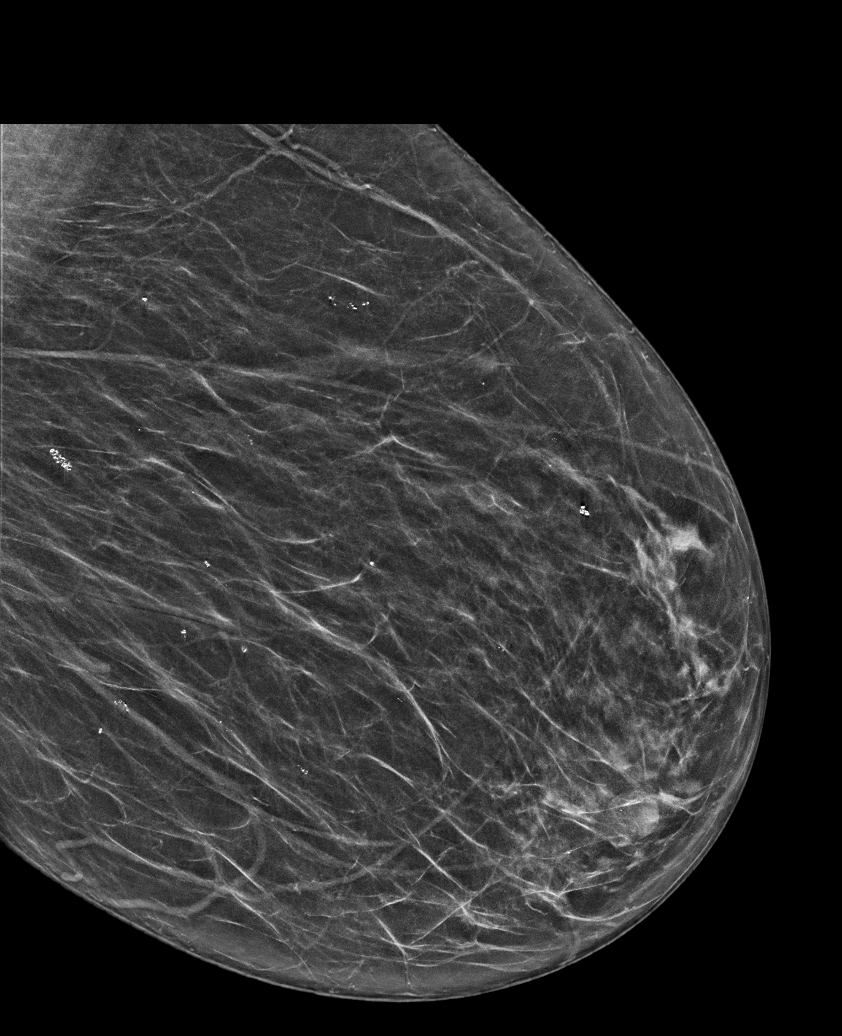

[L CC tomo · tomo slice 28/55.0]
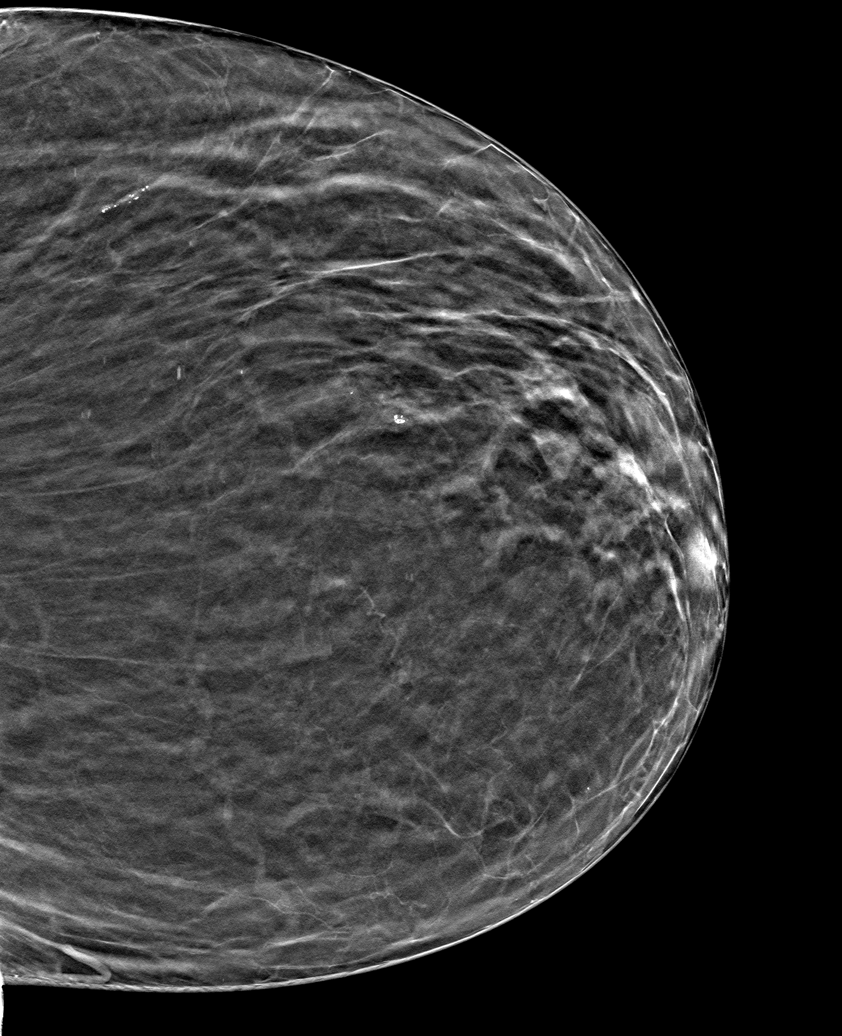

[6 of 30 positions shown; findings below may reference images not displayed]

ACR Breast Density Category b: There are scattered areas of
fibroglandular density.
FINDINGS: There are no findings suspicious for malignancy. Images were
processed with CAD.
IMPRESSION: No mammographic evidence of malignancy. A result letter of this
screening mammogram will be mailed directly to the patient.

RECOMMENDATION:
Screening mammogram in one year. (Code:[TQ])

BI-RADS CATEGORY  1: Negative.

## 2018-10-06 DIAGNOSIS — G5623 Lesion of ulnar nerve, bilateral upper limbs: Secondary | ICD-10-CM | POA: Diagnosis not present

## 2018-10-07 DIAGNOSIS — M5136 Other intervertebral disc degeneration, lumbar region: Secondary | ICD-10-CM | POA: Diagnosis not present

## 2018-10-07 DIAGNOSIS — M48061 Spinal stenosis, lumbar region without neurogenic claudication: Secondary | ICD-10-CM | POA: Diagnosis not present

## 2018-10-07 DIAGNOSIS — M545 Low back pain: Secondary | ICD-10-CM | POA: Diagnosis not present

## 2019-05-08 DIAGNOSIS — Z23 Encounter for immunization: Secondary | ICD-10-CM | POA: Diagnosis not present

## 2019-06-01 DIAGNOSIS — E7800 Pure hypercholesterolemia, unspecified: Secondary | ICD-10-CM | POA: Diagnosis not present

## 2019-06-01 DIAGNOSIS — I10 Essential (primary) hypertension: Secondary | ICD-10-CM | POA: Diagnosis not present

## 2019-06-01 DIAGNOSIS — M170 Bilateral primary osteoarthritis of knee: Secondary | ICD-10-CM | POA: Diagnosis not present

## 2019-06-01 DIAGNOSIS — M545 Low back pain: Secondary | ICD-10-CM | POA: Diagnosis not present

## 2019-07-01 DIAGNOSIS — E7800 Pure hypercholesterolemia, unspecified: Secondary | ICD-10-CM | POA: Diagnosis not present

## 2019-07-01 DIAGNOSIS — I10 Essential (primary) hypertension: Secondary | ICD-10-CM | POA: Diagnosis not present

## 2019-08-18 ENCOUNTER — Other Ambulatory Visit: Payer: Self-pay | Admitting: Family Medicine

## 2019-08-18 DIAGNOSIS — Z1231 Encounter for screening mammogram for malignant neoplasm of breast: Secondary | ICD-10-CM

## 2019-09-25 ENCOUNTER — Ambulatory Visit
Admission: RE | Admit: 2019-09-25 | Discharge: 2019-09-25 | Disposition: A | Discharge status: Home / Self Care | Payer: Medicare Other | Source: Ambulatory Visit | Attending: Family Medicine | Admitting: Family Medicine

## 2019-09-25 ENCOUNTER — Other Ambulatory Visit: Payer: Self-pay

## 2019-09-25 DIAGNOSIS — Z1231 Encounter for screening mammogram for malignant neoplasm of breast: Secondary | ICD-10-CM | POA: Diagnosis not present

## 2019-09-25 IMAGING — MG DIGITAL SCREENING BILAT W/ TOMO W/ CAD
8 of 14 series · 8 of 40 positions shown · non-contrast
Comparison: Previous exam(s).

CLINICAL DATA: Screening.

EXAM:
DIGITAL SCREENING BILATERAL MAMMOGRAM WITH TOMO AND CAD

[R MLO synth-2D (1 of 2)]
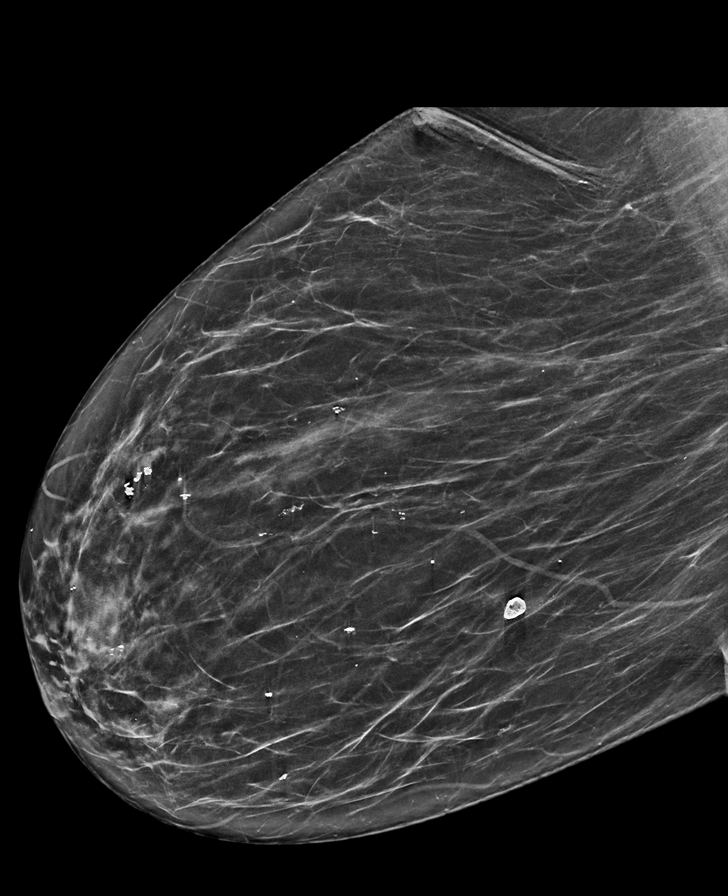

[L MLO synth-2D (1 of 2)]
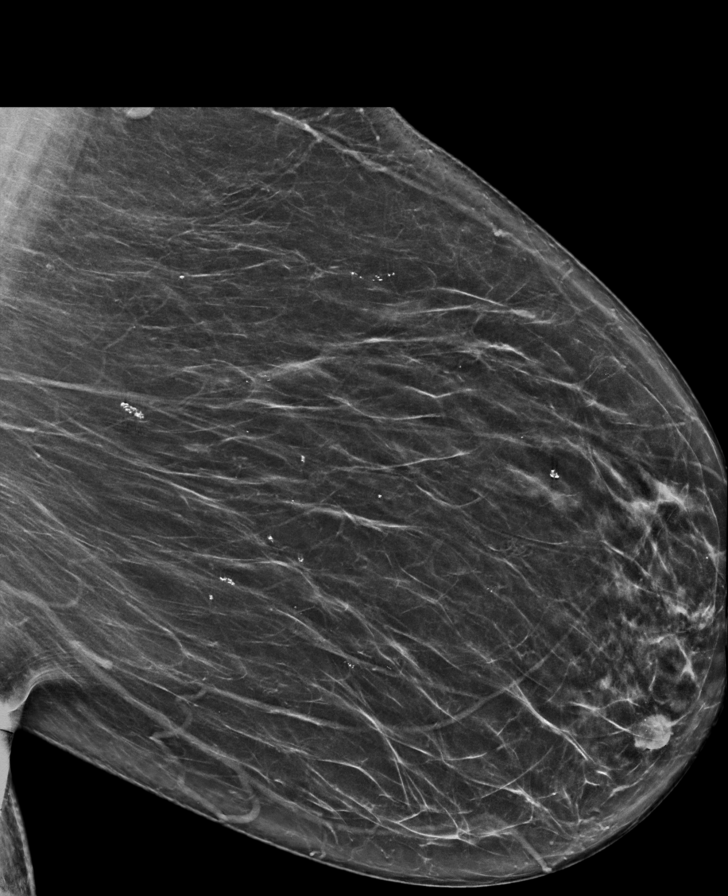

[L CC synth-2D (1 of 2)]
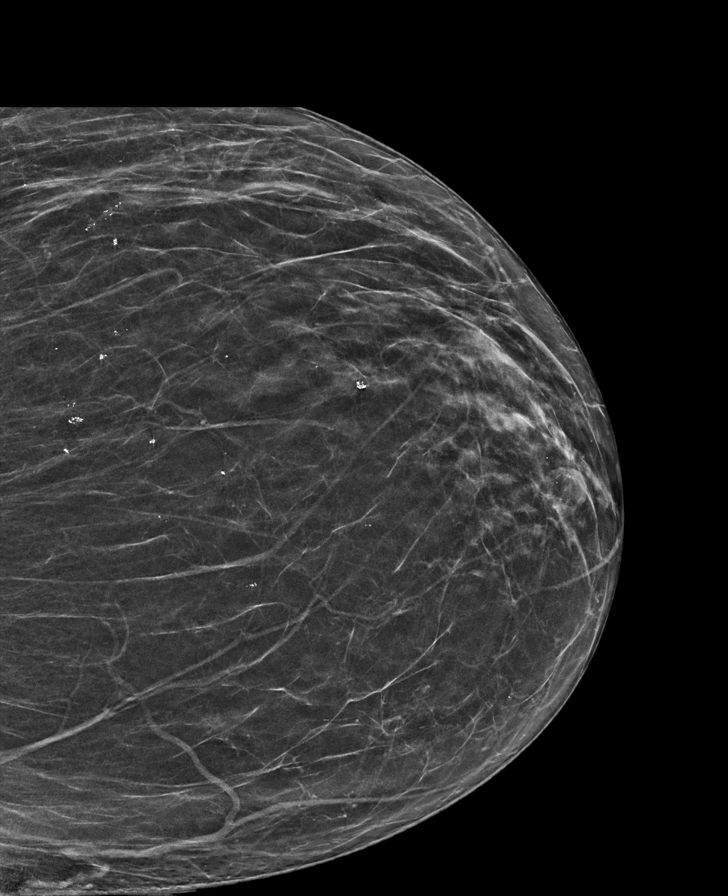

[L CC synth-2D (2 of 2)]
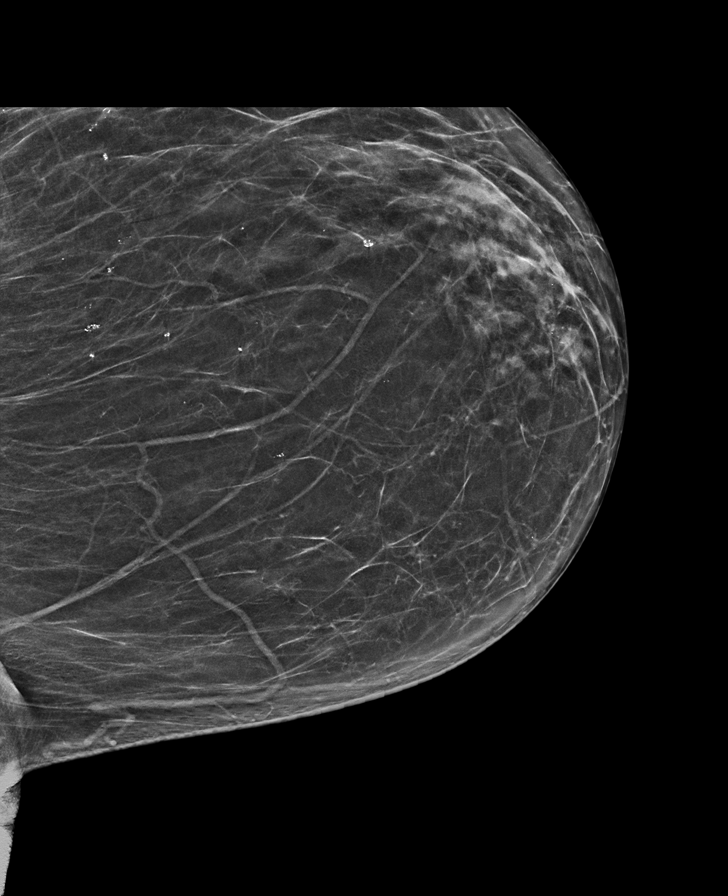

[L MLO synth-2D (2 of 2)]
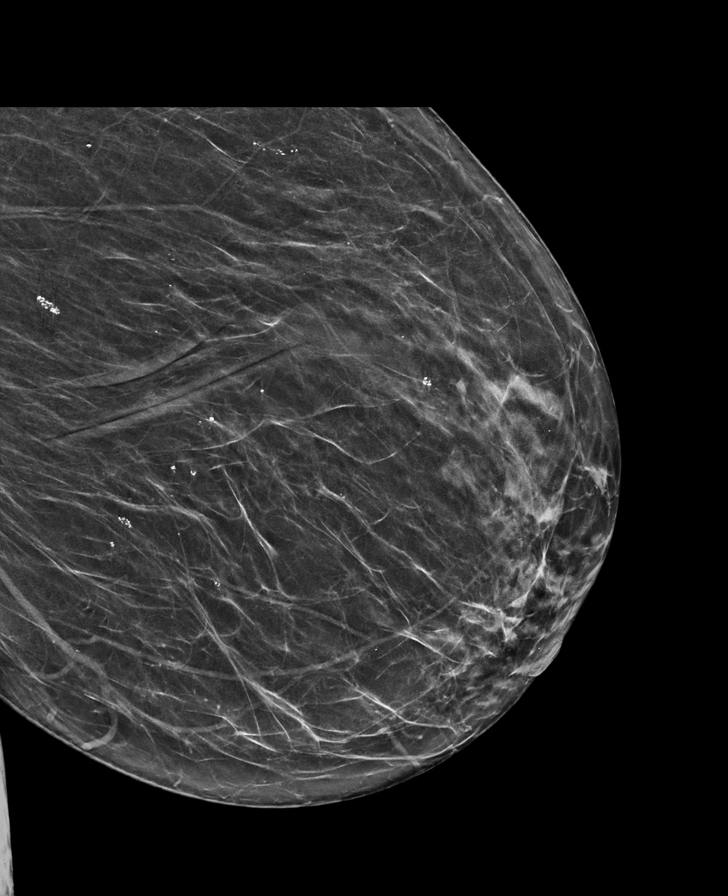

[R MLO synth-2D (2 of 2)]
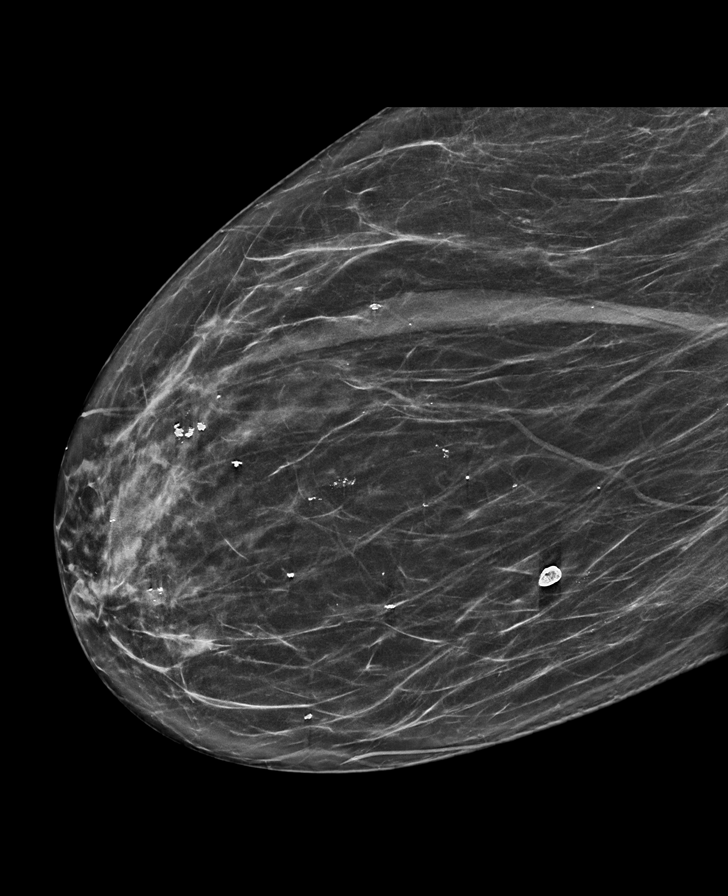

[R CC synth-2D]
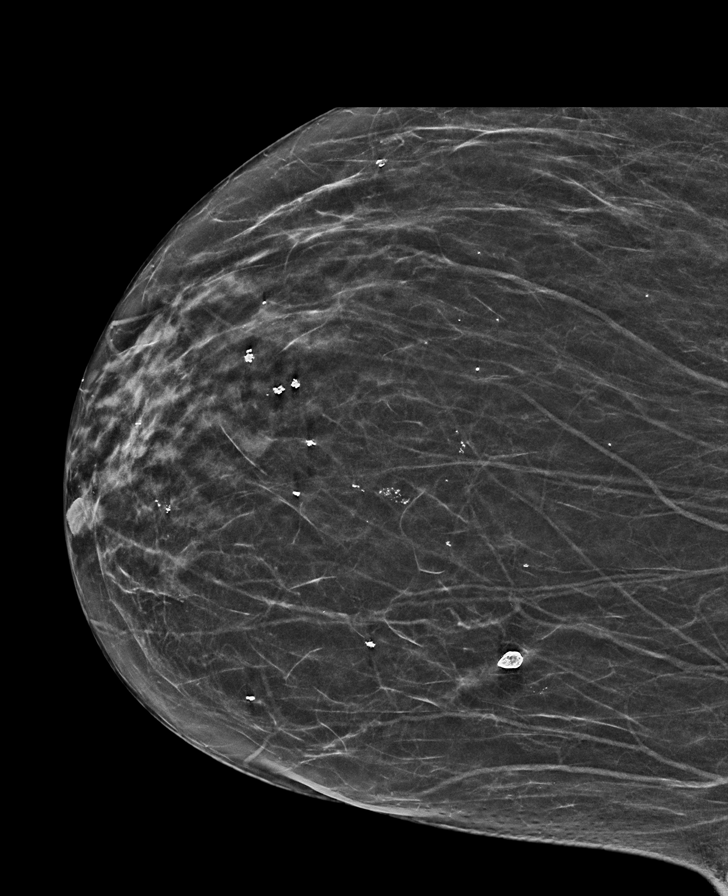

[L MLO tomo · tomo slice 34/67.0]
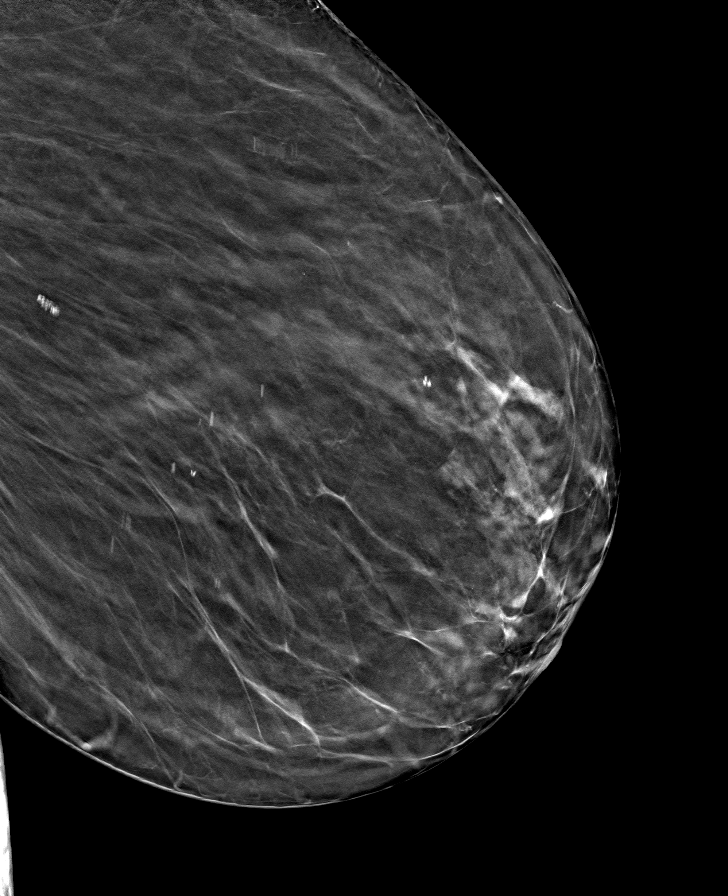

[8 of 40 positions shown; findings below may reference images not displayed]

ACR Breast Density Category b: There are scattered areas of
fibroglandular density.
FINDINGS: There are no findings suspicious for malignancy. Images were
processed with CAD.
IMPRESSION: No mammographic evidence of malignancy. A result letter of this
screening mammogram will be mailed directly to the patient.

RECOMMENDATION:
Screening mammogram in one year. (Code:[TQ])

BI-RADS CATEGORY  1: Negative.

## 2019-10-13 DIAGNOSIS — Z961 Presence of intraocular lens: Secondary | ICD-10-CM | POA: Diagnosis not present

## 2019-11-12 DIAGNOSIS — M5136 Other intervertebral disc degeneration, lumbar region: Secondary | ICD-10-CM | POA: Diagnosis not present

## 2019-11-12 DIAGNOSIS — M4316 Spondylolisthesis, lumbar region: Secondary | ICD-10-CM | POA: Diagnosis not present

## 2019-11-12 DIAGNOSIS — M48061 Spinal stenosis, lumbar region without neurogenic claudication: Secondary | ICD-10-CM | POA: Diagnosis not present

## 2019-11-12 DIAGNOSIS — M545 Low back pain: Secondary | ICD-10-CM | POA: Diagnosis not present

## 2019-11-12 DIAGNOSIS — G96810 Intracranial hypotension, unspecified: Secondary | ICD-10-CM | POA: Diagnosis not present

## 2019-11-13 DIAGNOSIS — R29818 Other symptoms and signs involving the nervous system: Secondary | ICD-10-CM | POA: Insufficient documentation

## 2019-12-02 DIAGNOSIS — N3281 Overactive bladder: Secondary | ICD-10-CM | POA: Diagnosis not present

## 2019-12-02 DIAGNOSIS — J309 Allergic rhinitis, unspecified: Secondary | ICD-10-CM | POA: Diagnosis not present

## 2019-12-02 DIAGNOSIS — M545 Low back pain: Secondary | ICD-10-CM | POA: Diagnosis not present

## 2019-12-02 DIAGNOSIS — E7800 Pure hypercholesterolemia, unspecified: Secondary | ICD-10-CM | POA: Diagnosis not present

## 2019-12-02 DIAGNOSIS — I10 Essential (primary) hypertension: Secondary | ICD-10-CM | POA: Diagnosis not present

## 2019-12-11 DIAGNOSIS — M5137 Other intervertebral disc degeneration, lumbosacral region: Secondary | ICD-10-CM | POA: Diagnosis not present

## 2019-12-25 DIAGNOSIS — M545 Low back pain: Secondary | ICD-10-CM | POA: Diagnosis not present

## 2019-12-31 DIAGNOSIS — M5136 Other intervertebral disc degeneration, lumbar region: Secondary | ICD-10-CM | POA: Diagnosis not present

## 2020-02-18 DIAGNOSIS — M25551 Pain in right hip: Secondary | ICD-10-CM | POA: Insufficient documentation

## 2020-03-29 DIAGNOSIS — R151 Fecal smearing: Secondary | ICD-10-CM | POA: Diagnosis not present

## 2020-03-29 DIAGNOSIS — Z86010 Personal history of colonic polyps: Secondary | ICD-10-CM | POA: Diagnosis not present

## 2020-04-29 DIAGNOSIS — M5137 Other intervertebral disc degeneration, lumbosacral region: Secondary | ICD-10-CM | POA: Diagnosis not present

## 2020-05-18 DIAGNOSIS — M47896 Other spondylosis, lumbar region: Secondary | ICD-10-CM | POA: Diagnosis not present

## 2020-05-18 DIAGNOSIS — M47816 Spondylosis without myelopathy or radiculopathy, lumbar region: Secondary | ICD-10-CM | POA: Diagnosis not present

## 2020-05-27 DIAGNOSIS — M47816 Spondylosis without myelopathy or radiculopathy, lumbar region: Secondary | ICD-10-CM | POA: Diagnosis not present

## 2020-06-07 DIAGNOSIS — N644 Mastodynia: Secondary | ICD-10-CM | POA: Diagnosis not present

## 2020-06-07 DIAGNOSIS — E7800 Pure hypercholesterolemia, unspecified: Secondary | ICD-10-CM | POA: Diagnosis not present

## 2020-06-07 DIAGNOSIS — Z23 Encounter for immunization: Secondary | ICD-10-CM | POA: Diagnosis not present

## 2020-06-07 DIAGNOSIS — I10 Essential (primary) hypertension: Secondary | ICD-10-CM | POA: Diagnosis not present

## 2020-06-07 DIAGNOSIS — Z0000 Encounter for general adult medical examination without abnormal findings: Secondary | ICD-10-CM | POA: Diagnosis not present

## 2020-06-16 ENCOUNTER — Other Ambulatory Visit: Payer: Self-pay | Admitting: Family Medicine

## 2020-06-16 DIAGNOSIS — N644 Mastodynia: Secondary | ICD-10-CM

## 2020-06-20 DIAGNOSIS — M5136 Other intervertebral disc degeneration, lumbar region: Secondary | ICD-10-CM | POA: Diagnosis not present

## 2020-06-24 ENCOUNTER — Other Ambulatory Visit: Payer: Self-pay | Admitting: Family Medicine

## 2020-06-24 DIAGNOSIS — E2839 Other primary ovarian failure: Secondary | ICD-10-CM

## 2020-07-13 ENCOUNTER — Ambulatory Visit: Payer: Medicare Other

## 2020-07-13 ENCOUNTER — Ambulatory Visit
Admission: RE | Admit: 2020-07-13 | Discharge: 2020-07-13 | Disposition: A | Discharge status: Home / Self Care | Payer: Medicare Other | Source: Ambulatory Visit | Attending: Family Medicine | Admitting: Family Medicine

## 2020-07-13 ENCOUNTER — Other Ambulatory Visit: Payer: Self-pay

## 2020-07-13 DIAGNOSIS — N644 Mastodynia: Secondary | ICD-10-CM

## 2020-07-13 IMAGING — MG DIGITAL DIAGNOSTIC BILAT W/ TOMO W/ CAD
6 of 12 series · 6 of 36 positions shown · non-contrast
Comparison: Previous exam(s).

CLINICAL DATA: 87-year-old female with diffuse, intermittent
bilateral pain/sensations.

EXAM:
DIGITAL DIAGNOSTIC BILATERAL MAMMOGRAM WITH TOMO AND CAD

[L MLO synth-2D (1 of 2)]
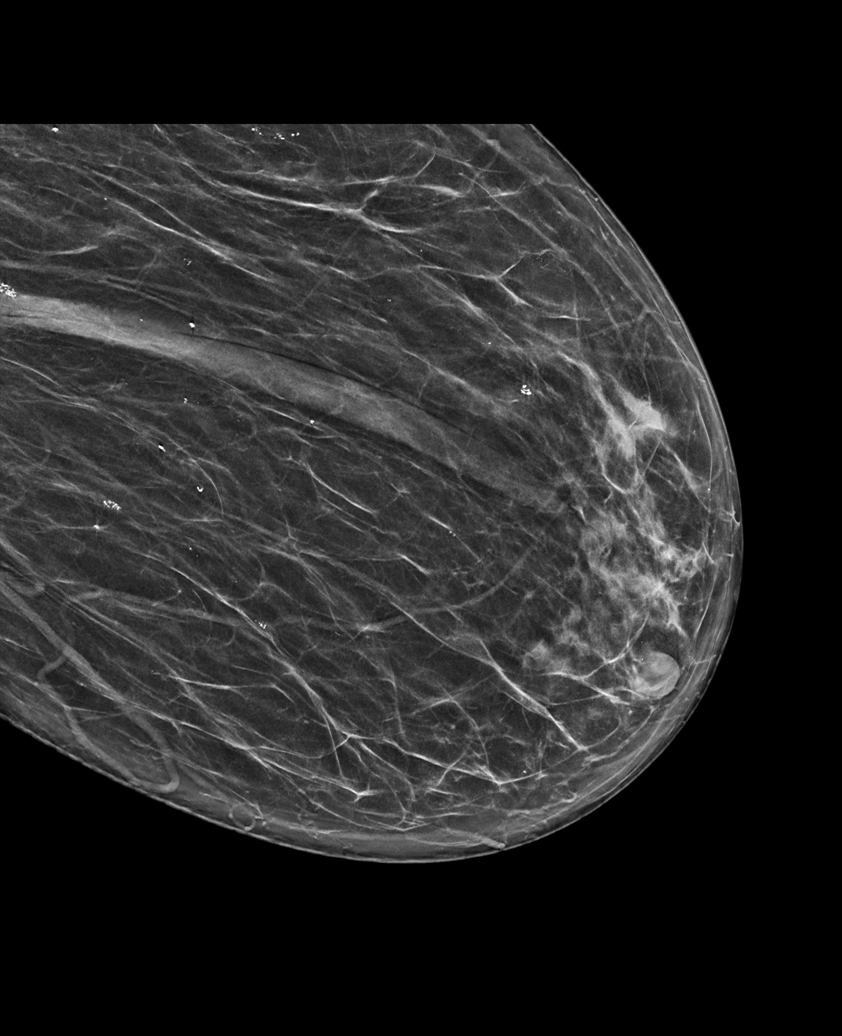

[R CC synth-2D]
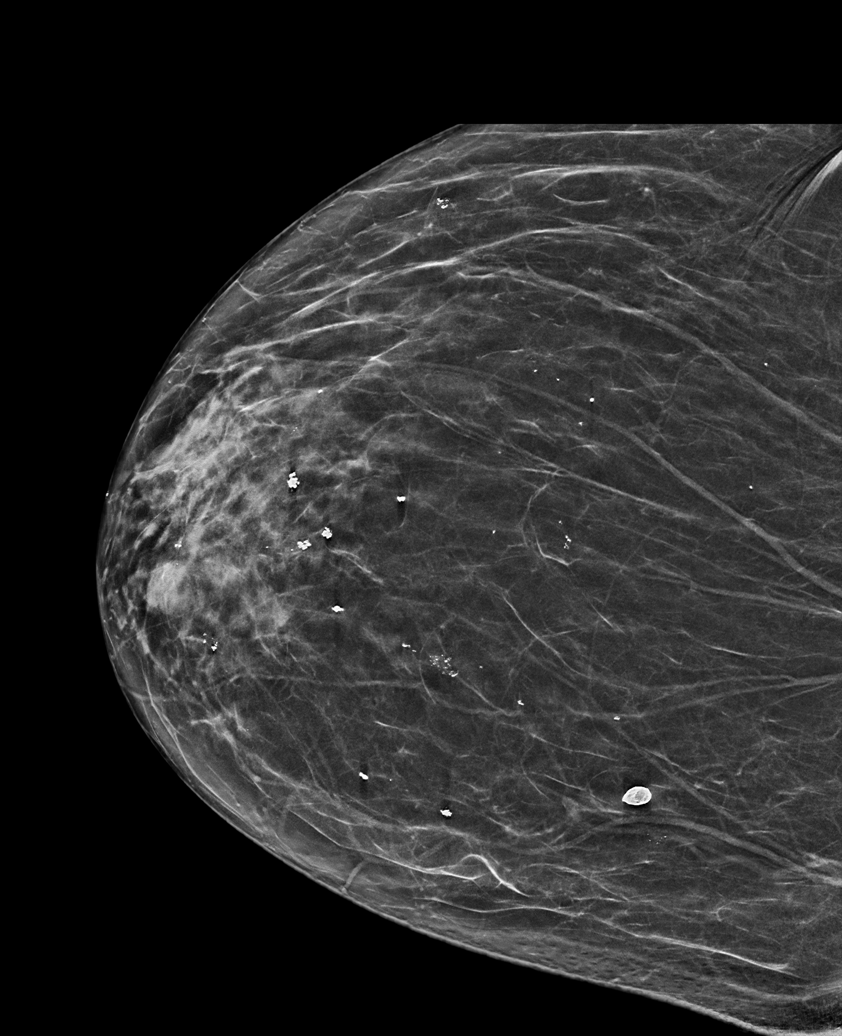

[R MLO synth-2D (1 of 2)]
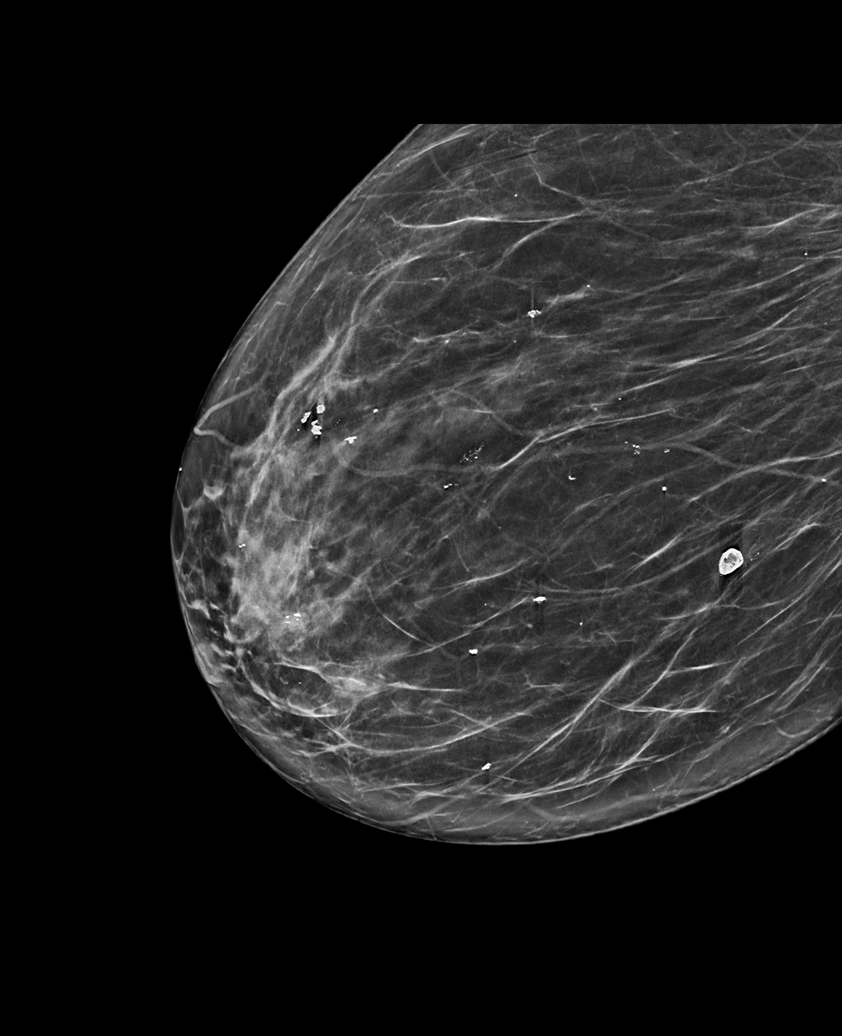

[L CC synth-2D]
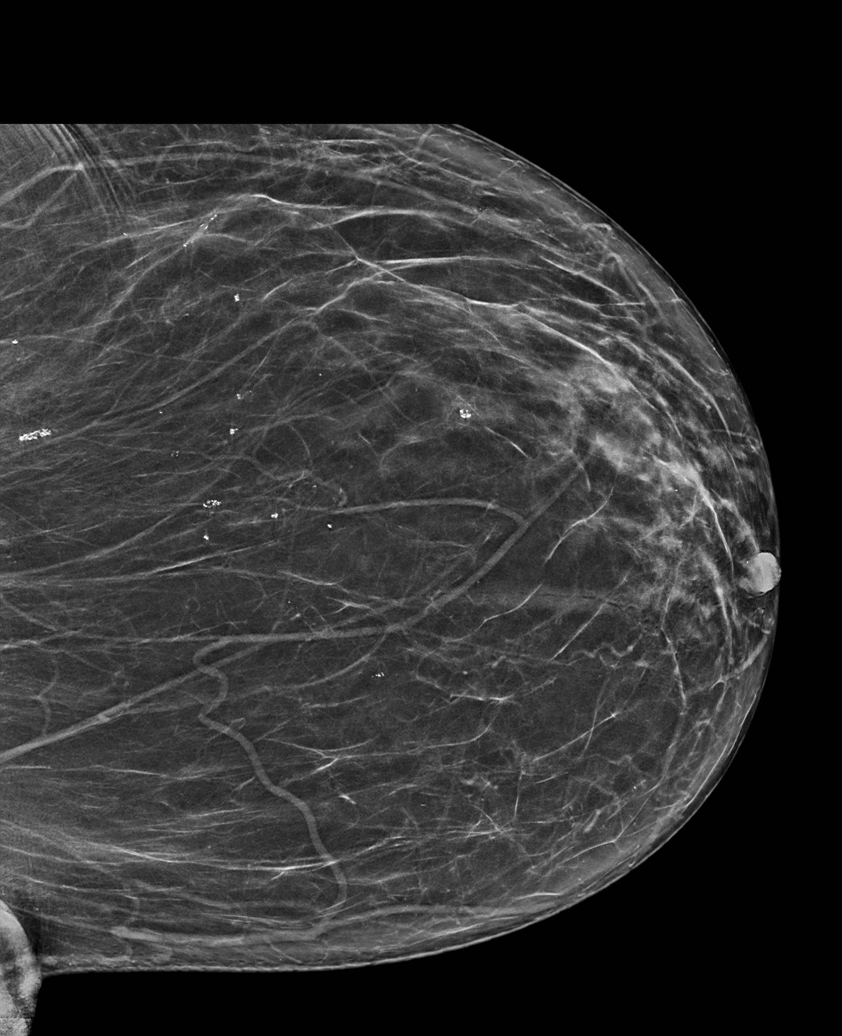

[L MLO synth-2D (2 of 2)]
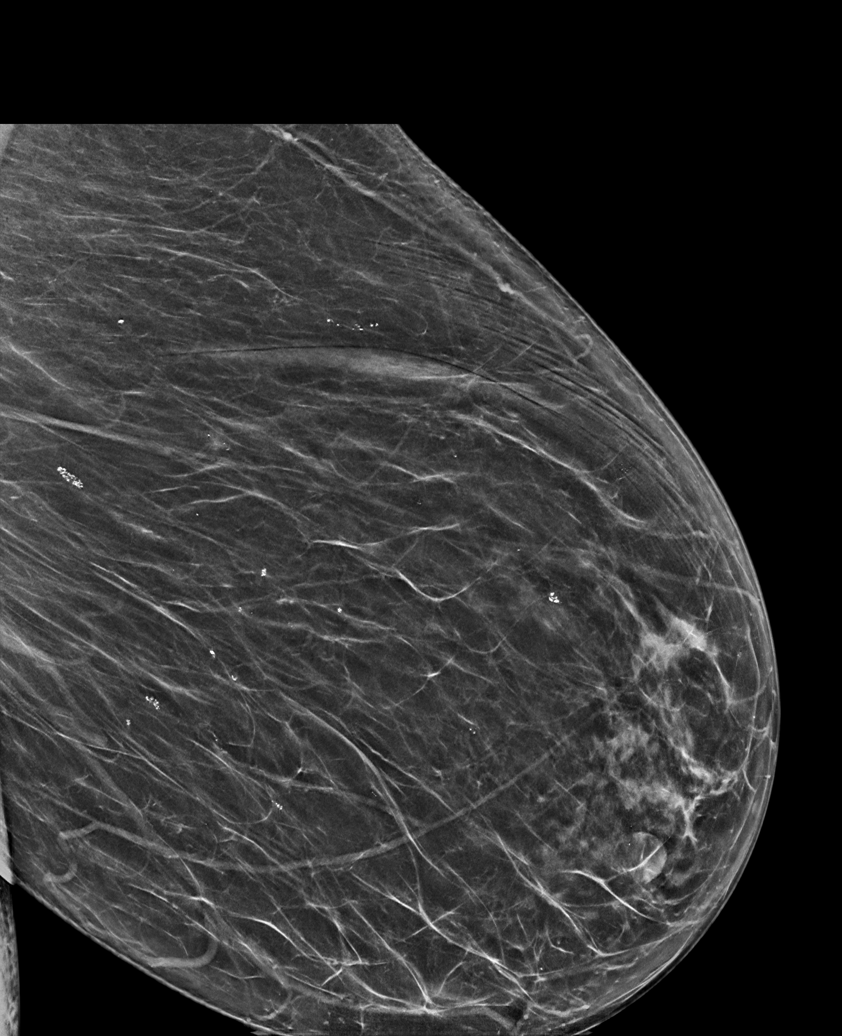

[R MLO synth-2D (2 of 2)]
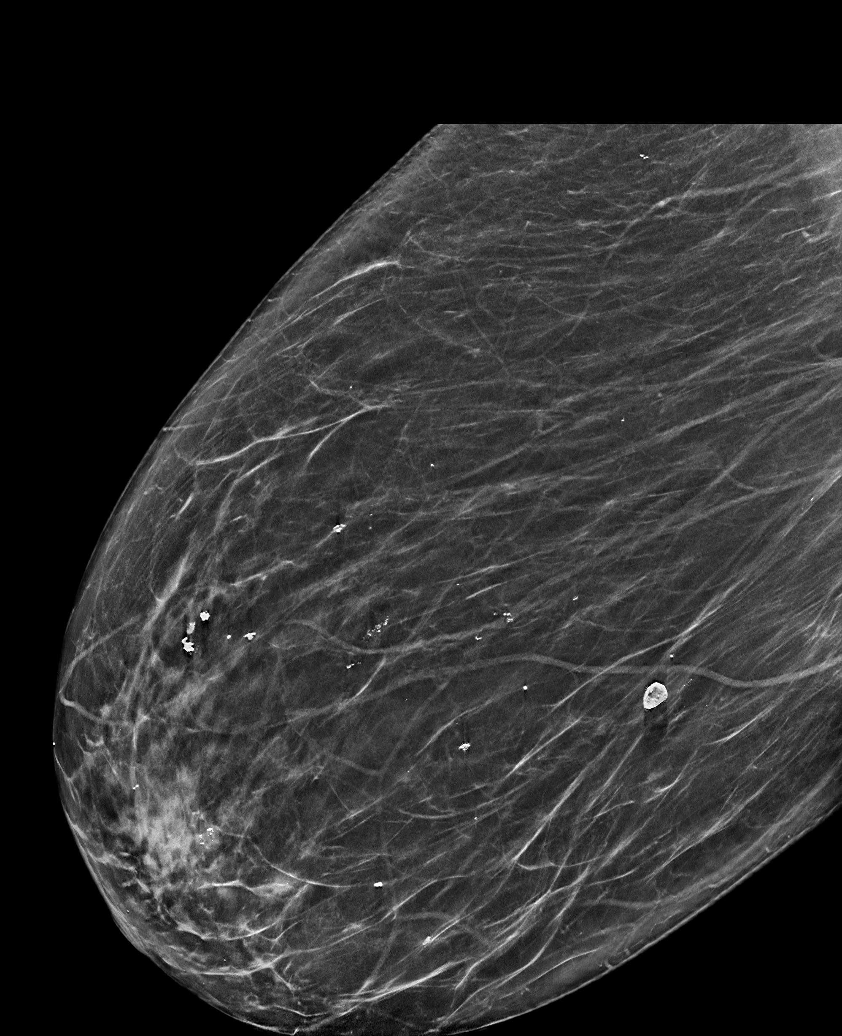

[6 of 36 positions shown; findings below may reference images not displayed]

ACR Breast Density Category b: There are scattered areas of
fibroglandular density.
FINDINGS: No suspicious mammographic findings are identified in either breast.
The parenchymal pattern is stable.

Mammographic images were processed with CAD.
IMPRESSION: No mammographic evidence of malignancy in either breast.

RECOMMENDATION:
1. Clinical follow-up recommended for the diffusely painful areas of
concern in the bilateral breasts. Any further workup should be based
on clinical grounds.
2.  Screening mammogram in one year.(Code:[GH])

I have discussed the findings and recommendations with the patient.
If applicable, a reminder letter will be sent to the patient
regarding the next appointment.

BI-RADS CATEGORY  1: Negative.

## 2020-07-14 ENCOUNTER — Ambulatory Visit
Admission: RE | Admit: 2020-07-14 | Discharge: 2020-07-14 | Disposition: A | Discharge status: Home / Self Care | Payer: Medicare Other | Source: Ambulatory Visit | Attending: Family Medicine | Admitting: Family Medicine

## 2020-07-14 DIAGNOSIS — Z780 Asymptomatic menopausal state: Secondary | ICD-10-CM | POA: Diagnosis not present

## 2020-07-14 DIAGNOSIS — E2839 Other primary ovarian failure: Secondary | ICD-10-CM

## 2020-07-14 DIAGNOSIS — M85851 Other specified disorders of bone density and structure, right thigh: Secondary | ICD-10-CM | POA: Diagnosis not present

## 2020-07-26 DIAGNOSIS — M5136 Other intervertebral disc degeneration, lumbar region: Secondary | ICD-10-CM | POA: Diagnosis not present

## 2020-07-26 DIAGNOSIS — Z6839 Body mass index (BMI) 39.0-39.9, adult: Secondary | ICD-10-CM | POA: Diagnosis not present

## 2020-07-26 DIAGNOSIS — R262 Difficulty in walking, not elsewhere classified: Secondary | ICD-10-CM | POA: Diagnosis not present

## 2020-07-26 DIAGNOSIS — M48062 Spinal stenosis, lumbar region with neurogenic claudication: Secondary | ICD-10-CM | POA: Diagnosis not present

## 2020-07-26 DIAGNOSIS — M545 Low back pain, unspecified: Secondary | ICD-10-CM | POA: Diagnosis not present

## 2020-07-27 DIAGNOSIS — Z23 Encounter for immunization: Secondary | ICD-10-CM | POA: Diagnosis not present

## 2020-08-08 DIAGNOSIS — M5136 Other intervertebral disc degeneration, lumbar region: Secondary | ICD-10-CM | POA: Diagnosis not present

## 2020-08-08 DIAGNOSIS — R262 Difficulty in walking, not elsewhere classified: Secondary | ICD-10-CM | POA: Diagnosis not present

## 2020-08-08 DIAGNOSIS — M545 Low back pain, unspecified: Secondary | ICD-10-CM | POA: Diagnosis not present

## 2020-08-08 DIAGNOSIS — Z6839 Body mass index (BMI) 39.0-39.9, adult: Secondary | ICD-10-CM | POA: Diagnosis not present

## 2020-08-08 DIAGNOSIS — M48062 Spinal stenosis, lumbar region with neurogenic claudication: Secondary | ICD-10-CM | POA: Diagnosis not present

## 2020-08-10 DIAGNOSIS — M545 Low back pain, unspecified: Secondary | ICD-10-CM | POA: Diagnosis not present

## 2020-08-10 DIAGNOSIS — M5136 Other intervertebral disc degeneration, lumbar region: Secondary | ICD-10-CM | POA: Diagnosis not present

## 2020-08-10 DIAGNOSIS — M48062 Spinal stenosis, lumbar region with neurogenic claudication: Secondary | ICD-10-CM | POA: Diagnosis not present

## 2020-08-10 DIAGNOSIS — R262 Difficulty in walking, not elsewhere classified: Secondary | ICD-10-CM | POA: Diagnosis not present

## 2020-08-10 DIAGNOSIS — Z6839 Body mass index (BMI) 39.0-39.9, adult: Secondary | ICD-10-CM | POA: Diagnosis not present

## 2020-08-15 DIAGNOSIS — Z6839 Body mass index (BMI) 39.0-39.9, adult: Secondary | ICD-10-CM | POA: Diagnosis not present

## 2020-08-15 DIAGNOSIS — M48062 Spinal stenosis, lumbar region with neurogenic claudication: Secondary | ICD-10-CM | POA: Diagnosis not present

## 2020-08-15 DIAGNOSIS — M545 Low back pain, unspecified: Secondary | ICD-10-CM | POA: Diagnosis not present

## 2020-08-15 DIAGNOSIS — M5136 Other intervertebral disc degeneration, lumbar region: Secondary | ICD-10-CM | POA: Diagnosis not present

## 2020-08-15 DIAGNOSIS — R262 Difficulty in walking, not elsewhere classified: Secondary | ICD-10-CM | POA: Diagnosis not present

## 2020-08-22 DIAGNOSIS — Z6839 Body mass index (BMI) 39.0-39.9, adult: Secondary | ICD-10-CM | POA: Diagnosis not present

## 2020-08-22 DIAGNOSIS — M5136 Other intervertebral disc degeneration, lumbar region: Secondary | ICD-10-CM | POA: Diagnosis not present

## 2020-08-22 DIAGNOSIS — M48062 Spinal stenosis, lumbar region with neurogenic claudication: Secondary | ICD-10-CM | POA: Diagnosis not present

## 2020-08-22 DIAGNOSIS — M545 Low back pain, unspecified: Secondary | ICD-10-CM | POA: Diagnosis not present

## 2020-08-22 DIAGNOSIS — R262 Difficulty in walking, not elsewhere classified: Secondary | ICD-10-CM | POA: Diagnosis not present

## 2020-09-30 DIAGNOSIS — I10 Essential (primary) hypertension: Secondary | ICD-10-CM | POA: Diagnosis not present

## 2020-09-30 DIAGNOSIS — R32 Unspecified urinary incontinence: Secondary | ICD-10-CM | POA: Diagnosis not present

## 2020-10-06 ENCOUNTER — Other Ambulatory Visit: Payer: Medicare Other

## 2020-10-21 ENCOUNTER — Inpatient Hospital Stay (HOSPITAL_COMMUNITY)
Admission: EM | Admit: 2020-10-21 | Discharge: 2020-10-26 | DRG: 208 | Disposition: A | Discharge status: Rehabilitation Facility | Payer: Medicare Other | Attending: Internal Medicine | Admitting: Internal Medicine

## 2020-10-21 ENCOUNTER — Emergency Department (HOSPITAL_COMMUNITY)
Admit: 2020-10-21 | Discharge: 2020-10-21 | Disposition: A | Discharge status: Home / Self Care | Payer: Medicare Other | Attending: Emergency Medicine | Admitting: Emergency Medicine

## 2020-10-21 ENCOUNTER — Emergency Department (HOSPITAL_COMMUNITY): Payer: Medicare Other

## 2020-10-21 ENCOUNTER — Encounter (HOSPITAL_COMMUNITY): Payer: Self-pay

## 2020-10-21 ENCOUNTER — Other Ambulatory Visit: Payer: Self-pay

## 2020-10-21 DIAGNOSIS — R4182 Altered mental status, unspecified: Secondary | ICD-10-CM | POA: Diagnosis not present

## 2020-10-21 DIAGNOSIS — E785 Hyperlipidemia, unspecified: Secondary | ICD-10-CM | POA: Diagnosis present

## 2020-10-21 DIAGNOSIS — G4733 Obstructive sleep apnea (adult) (pediatric): Secondary | ICD-10-CM | POA: Diagnosis not present

## 2020-10-21 DIAGNOSIS — Z9981 Dependence on supplemental oxygen: Secondary | ICD-10-CM

## 2020-10-21 DIAGNOSIS — I110 Hypertensive heart disease with heart failure: Secondary | ICD-10-CM | POA: Diagnosis not present

## 2020-10-21 DIAGNOSIS — Z880 Allergy status to penicillin: Secondary | ICD-10-CM | POA: Diagnosis not present

## 2020-10-21 DIAGNOSIS — E871 Hypo-osmolality and hyponatremia: Secondary | ICD-10-CM

## 2020-10-21 DIAGNOSIS — R739 Hyperglycemia, unspecified: Secondary | ICD-10-CM | POA: Diagnosis present

## 2020-10-21 DIAGNOSIS — M8580 Other specified disorders of bone density and structure, unspecified site: Secondary | ICD-10-CM | POA: Diagnosis present

## 2020-10-21 DIAGNOSIS — R350 Frequency of micturition: Secondary | ICD-10-CM | POA: Diagnosis present

## 2020-10-21 DIAGNOSIS — G479 Sleep disorder, unspecified: Secondary | ICD-10-CM | POA: Diagnosis not present

## 2020-10-21 DIAGNOSIS — R4781 Slurred speech: Secondary | ICD-10-CM | POA: Diagnosis not present

## 2020-10-21 DIAGNOSIS — R4189 Other symptoms and signs involving cognitive functions and awareness: Secondary | ICD-10-CM | POA: Diagnosis not present

## 2020-10-21 DIAGNOSIS — M7989 Other specified soft tissue disorders: Secondary | ICD-10-CM | POA: Diagnosis present

## 2020-10-21 DIAGNOSIS — J449 Chronic obstructive pulmonary disease, unspecified: Secondary | ICD-10-CM | POA: Diagnosis present

## 2020-10-21 DIAGNOSIS — G9340 Encephalopathy, unspecified: Secondary | ICD-10-CM

## 2020-10-21 DIAGNOSIS — G9341 Metabolic encephalopathy: Secondary | ICD-10-CM | POA: Diagnosis not present

## 2020-10-21 DIAGNOSIS — R600 Localized edema: Secondary | ICD-10-CM

## 2020-10-21 DIAGNOSIS — I6381 Other cerebral infarction due to occlusion or stenosis of small artery: Secondary | ICD-10-CM | POA: Diagnosis not present

## 2020-10-21 DIAGNOSIS — R7303 Prediabetes: Secondary | ICD-10-CM | POA: Diagnosis present

## 2020-10-21 DIAGNOSIS — J9811 Atelectasis: Secondary | ICD-10-CM | POA: Diagnosis present

## 2020-10-21 DIAGNOSIS — M170 Bilateral primary osteoarthritis of knee: Secondary | ICD-10-CM | POA: Diagnosis present

## 2020-10-21 DIAGNOSIS — R251 Tremor, unspecified: Secondary | ICD-10-CM | POA: Diagnosis present

## 2020-10-21 DIAGNOSIS — I672 Cerebral atherosclerosis: Secondary | ICD-10-CM | POA: Diagnosis not present

## 2020-10-21 DIAGNOSIS — I160 Hypertensive urgency: Secondary | ICD-10-CM

## 2020-10-21 DIAGNOSIS — Y92239 Unspecified place in hospital as the place of occurrence of the external cause: Secondary | ICD-10-CM | POA: Diagnosis present

## 2020-10-21 DIAGNOSIS — M545 Low back pain, unspecified: Secondary | ICD-10-CM | POA: Diagnosis present

## 2020-10-21 DIAGNOSIS — Z930 Tracheostomy status: Secondary | ICD-10-CM | POA: Diagnosis not present

## 2020-10-21 DIAGNOSIS — Z6841 Body Mass Index (BMI) 40.0 and over, adult: Secondary | ICD-10-CM

## 2020-10-21 DIAGNOSIS — E873 Alkalosis: Secondary | ICD-10-CM | POA: Diagnosis not present

## 2020-10-21 DIAGNOSIS — Z881 Allergy status to other antibiotic agents status: Secondary | ICD-10-CM | POA: Diagnosis not present

## 2020-10-21 DIAGNOSIS — Z87891 Personal history of nicotine dependence: Secondary | ICD-10-CM | POA: Diagnosis not present

## 2020-10-21 DIAGNOSIS — R0602 Shortness of breath: Secondary | ICD-10-CM | POA: Diagnosis not present

## 2020-10-21 DIAGNOSIS — Z8249 Family history of ischemic heart disease and other diseases of the circulatory system: Secondary | ICD-10-CM | POA: Diagnosis not present

## 2020-10-21 DIAGNOSIS — H0520 Unspecified exophthalmos: Secondary | ICD-10-CM | POA: Diagnosis not present

## 2020-10-21 DIAGNOSIS — J984 Other disorders of lung: Secondary | ICD-10-CM | POA: Diagnosis not present

## 2020-10-21 DIAGNOSIS — I5031 Acute diastolic (congestive) heart failure: Secondary | ICD-10-CM | POA: Diagnosis not present

## 2020-10-21 DIAGNOSIS — T502X5A Adverse effect of carbonic-anhydrase inhibitors, benzothiadiazides and other diuretics, initial encounter: Secondary | ICD-10-CM | POA: Diagnosis present

## 2020-10-21 DIAGNOSIS — J9621 Acute and chronic respiratory failure with hypoxia: Secondary | ICD-10-CM | POA: Diagnosis present

## 2020-10-21 DIAGNOSIS — Z8639 Personal history of other endocrine, nutritional and metabolic disease: Secondary | ICD-10-CM | POA: Diagnosis not present

## 2020-10-21 DIAGNOSIS — I700 Atherosclerosis of aorta: Secondary | ICD-10-CM | POA: Diagnosis not present

## 2020-10-21 DIAGNOSIS — J42 Unspecified chronic bronchitis: Secondary | ICD-10-CM | POA: Diagnosis not present

## 2020-10-21 DIAGNOSIS — E8770 Fluid overload, unspecified: Secondary | ICD-10-CM | POA: Diagnosis present

## 2020-10-21 DIAGNOSIS — Z8719 Personal history of other diseases of the digestive system: Secondary | ICD-10-CM

## 2020-10-21 DIAGNOSIS — Z20822 Contact with and (suspected) exposure to covid-19: Secondary | ICD-10-CM | POA: Diagnosis present

## 2020-10-21 DIAGNOSIS — R0689 Other abnormalities of breathing: Secondary | ICD-10-CM | POA: Diagnosis not present

## 2020-10-21 DIAGNOSIS — R410 Disorientation, unspecified: Secondary | ICD-10-CM | POA: Diagnosis not present

## 2020-10-21 DIAGNOSIS — Y636 Underdosing and nonadministration of necessary drug, medicament or biological substance: Secondary | ICD-10-CM | POA: Diagnosis present

## 2020-10-21 DIAGNOSIS — I6931 Attention and concentration deficit following cerebral infarction: Secondary | ICD-10-CM | POA: Diagnosis not present

## 2020-10-21 DIAGNOSIS — I639 Cerebral infarction, unspecified: Secondary | ICD-10-CM | POA: Diagnosis not present

## 2020-10-21 DIAGNOSIS — J9602 Acute respiratory failure with hypercapnia: Secondary | ICD-10-CM | POA: Diagnosis present

## 2020-10-21 DIAGNOSIS — I6782 Cerebral ischemia: Secondary | ICD-10-CM | POA: Diagnosis not present

## 2020-10-21 DIAGNOSIS — Z888 Allergy status to other drugs, medicaments and biological substances status: Secondary | ICD-10-CM

## 2020-10-21 DIAGNOSIS — T502X6A Underdosing of carbonic-anhydrase inhibitors, benzothiadiazides and other diuretics, initial encounter: Secondary | ICD-10-CM | POA: Diagnosis not present

## 2020-10-21 DIAGNOSIS — J9601 Acute respiratory failure with hypoxia: Secondary | ICD-10-CM | POA: Diagnosis present

## 2020-10-21 DIAGNOSIS — Z978 Presence of other specified devices: Secondary | ICD-10-CM

## 2020-10-21 DIAGNOSIS — I6502 Occlusion and stenosis of left vertebral artery: Secondary | ICD-10-CM | POA: Diagnosis not present

## 2020-10-21 DIAGNOSIS — I10 Essential (primary) hypertension: Secondary | ICD-10-CM | POA: Diagnosis present

## 2020-10-21 DIAGNOSIS — R351 Nocturia: Secondary | ICD-10-CM | POA: Diagnosis present

## 2020-10-21 DIAGNOSIS — G8929 Other chronic pain: Secondary | ICD-10-CM | POA: Diagnosis not present

## 2020-10-21 DIAGNOSIS — J9622 Acute and chronic respiratory failure with hypercapnia: Secondary | ICD-10-CM | POA: Diagnosis present

## 2020-10-21 DIAGNOSIS — J410 Simple chronic bronchitis: Secondary | ICD-10-CM | POA: Diagnosis not present

## 2020-10-21 DIAGNOSIS — Z4682 Encounter for fitting and adjustment of non-vascular catheter: Secondary | ICD-10-CM | POA: Diagnosis not present

## 2020-10-21 DIAGNOSIS — E8779 Other fluid overload: Secondary | ICD-10-CM | POA: Diagnosis not present

## 2020-10-21 DIAGNOSIS — Z79899 Other long term (current) drug therapy: Secondary | ICD-10-CM

## 2020-10-21 DIAGNOSIS — E041 Nontoxic single thyroid nodule: Secondary | ICD-10-CM | POA: Diagnosis present

## 2020-10-21 DIAGNOSIS — N179 Acute kidney failure, unspecified: Secondary | ICD-10-CM | POA: Diagnosis present

## 2020-10-21 DIAGNOSIS — I69398 Other sequelae of cerebral infarction: Secondary | ICD-10-CM | POA: Diagnosis present

## 2020-10-21 LAB — BASIC METABOLIC PANEL
Anion gap: 8 (ref 5–15)
BUN: 17 mg/dL (ref 8–23)
CO2: 33 mmol/L — ABNORMAL HIGH (ref 22–32)
Calcium: 9 mg/dL (ref 8.9–10.3)
Chloride: 99 mmol/L (ref 98–111)
Creatinine, Ser: 0.98 mg/dL (ref 0.44–1.00)
GFR, Estimated: 57 mL/min — ABNORMAL LOW (ref >60)
Glucose, Bld: 105 mg/dL — ABNORMAL HIGH (ref 70–99)
Potassium: 4.7 mmol/L (ref 3.5–5.1)
Sodium: 140 mmol/L (ref 135–145)

## 2020-10-21 LAB — RESP PANEL BY RT-PCR (FLU A&B, COVID) ARPGX2
Influenza A by PCR: NEGATIVE
Influenza B by PCR: NEGATIVE
SARS Coronavirus 2 by RT PCR: NEGATIVE

## 2020-10-21 LAB — CBC
HCT: 45.3 % (ref 36.0–46.0)
Hemoglobin: 13.8 g/dL (ref 12.0–15.0)
MCH: 30.3 pg (ref 26.0–34.0)
MCHC: 30.5 g/dL (ref 30.0–36.0)
MCV: 99.6 fL (ref 80.0–100.0)
Platelets: 153 10*3/uL (ref 150–400)
RBC: 4.55 MIL/uL (ref 3.87–5.11)
RDW: 13.2 % (ref 11.5–15.5)
WBC: 7.9 10*3/uL (ref 4.0–10.5)
nRBC: 0 % (ref 0.0–0.2)

## 2020-10-21 LAB — URINALYSIS, ROUTINE W REFLEX MICROSCOPIC
Bilirubin Urine: NEGATIVE
Glucose, UA: NEGATIVE mg/dL
Hgb urine dipstick: NEGATIVE
Ketones, ur: NEGATIVE mg/dL
Leukocytes,Ua: NEGATIVE
Nitrite: NEGATIVE
Protein, ur: NEGATIVE mg/dL
Specific Gravity, Urine: 1.009 (ref 1.005–1.030)
pH: 5 (ref 5.0–8.0)

## 2020-10-21 LAB — BRAIN NATRIURETIC PEPTIDE: B Natriuretic Peptide: 273.5 pg/mL — ABNORMAL HIGH (ref 0.0–100.0)

## 2020-10-21 LAB — D-DIMER, QUANTITATIVE: D-Dimer, Quant: 0.65 ug/mL-FEU — ABNORMAL HIGH (ref 0.00–0.50)

## 2020-10-21 IMAGING — DX DG CHEST 1V PORT
1 series · 1 of 1 positions shown · non-contrast
Comparison: PA and lateral chest [DATE].

CLINICAL DATA: Shortness of breath and bilateral lower extremity
swelling.

EXAM:
PORTABLE CHEST 1 VIEW

[chest ap]
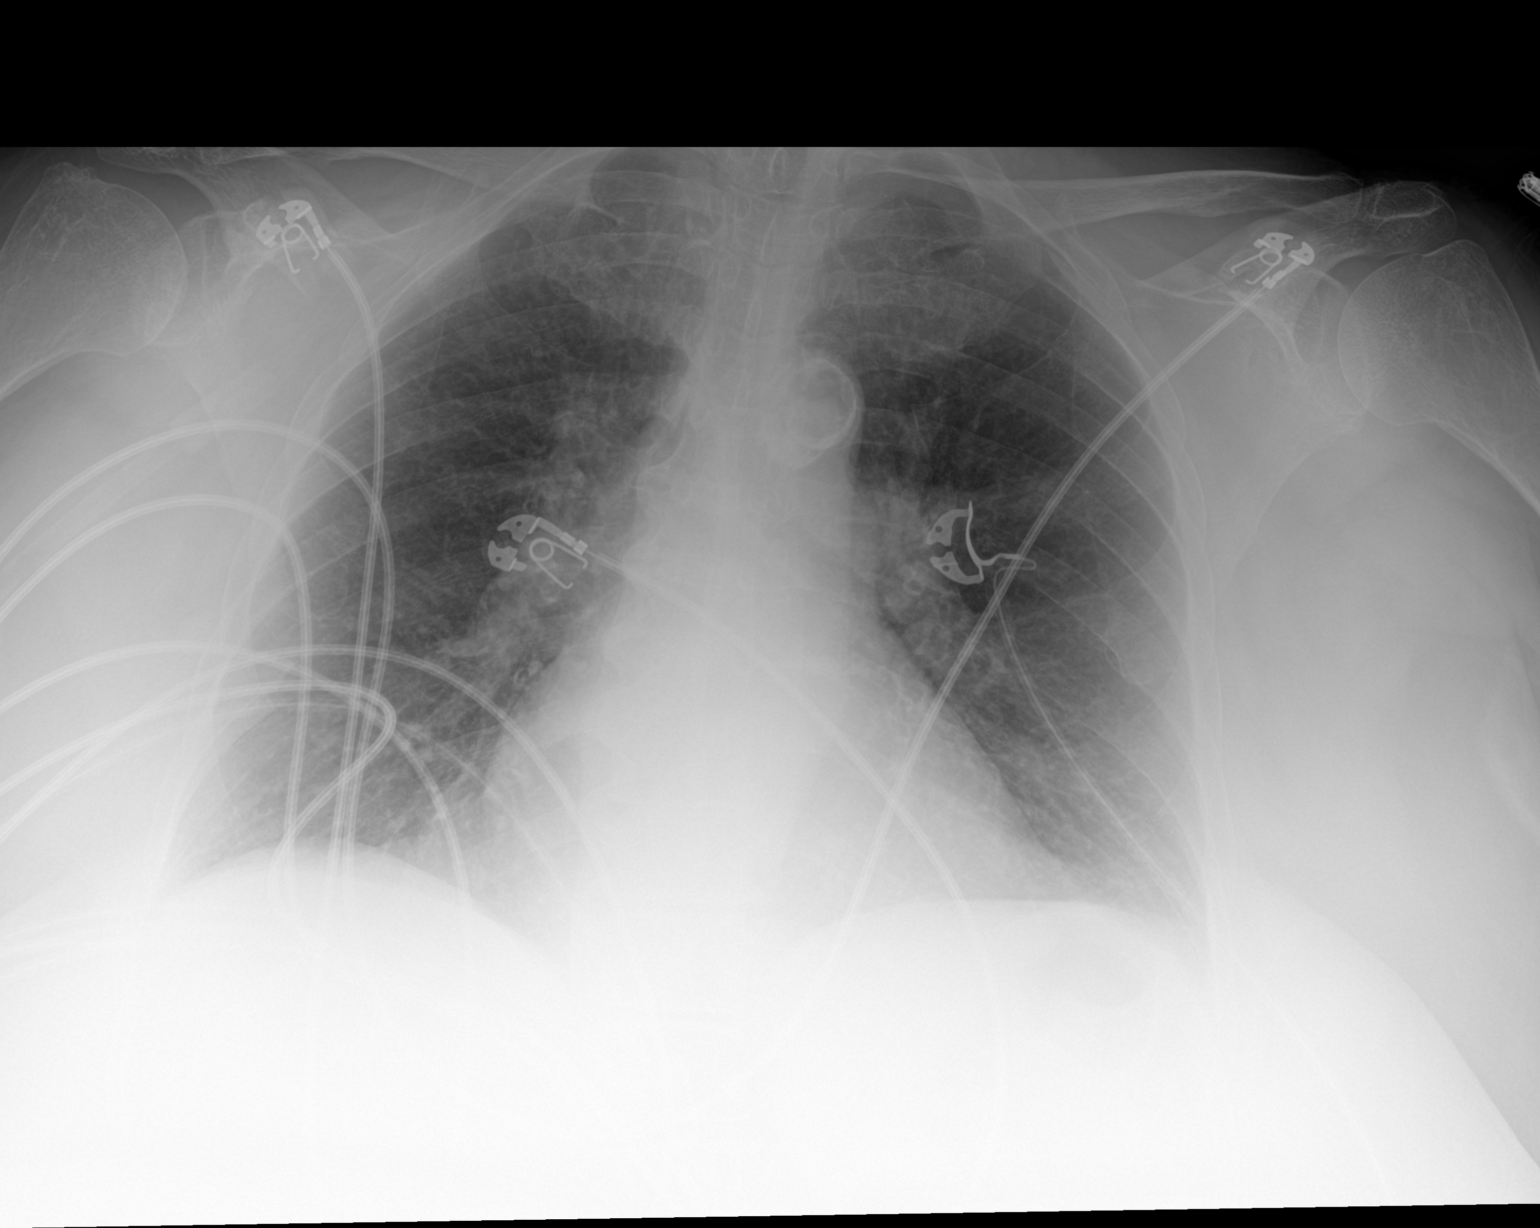

[1 of 1 positions shown; findings below may reference images not displayed]

FINDINGS: The lungs are clear. Heart size is normal. No pneumothorax or
pleural effusion. Aortic atherosclerosis is noted. No acute or focal
bony abnormality.
IMPRESSION: No acute disease.

Aortic Atherosclerosis ([J2]-[J2]).

## 2020-10-21 MED ORDER — FUROSEMIDE 10 MG/ML IJ SOLN
20.0000 mg | Freq: Two times a day (BID) | INTRAMUSCULAR | Status: DC
  Administered 2020-10-21 – 2020-10-23 (×4): 20 mg via INTRAVENOUS
  Filled 2020-10-21 (×4): qty 2

## 2020-10-21 MED ORDER — ENOXAPARIN SODIUM 40 MG/0.4ML ~~LOC~~ SOLN
40.0000 mg | SUBCUTANEOUS | Status: DC
  Administered 2020-10-21 – 2020-10-25 (×5): 40 mg via SUBCUTANEOUS
  Filled 2020-10-21 (×5): qty 0.4

## 2020-10-21 MED ORDER — SIMVASTATIN 5 MG PO TABS
5.0000 mg | ORAL_TABLET | Freq: Every day | ORAL | Status: DC
  Administered 2020-10-22 – 2020-10-26 (×4): 5 mg via ORAL
  Filled 2020-10-21 (×5): qty 1

## 2020-10-21 MED ORDER — FUROSEMIDE 10 MG/ML IJ SOLN
20.0000 mg | Freq: Once | INTRAMUSCULAR | Status: AC
  Administered 2020-10-21: 20 mg via INTRAVENOUS
  Filled 2020-10-21: qty 4

## 2020-10-21 MED ORDER — METOPROLOL SUCCINATE ER 50 MG PO TB24
75.0000 mg | ORAL_TABLET | Freq: Every day | ORAL | Status: DC
  Administered 2020-10-21 – 2020-10-23 (×3): 75 mg via ORAL
  Filled 2020-10-21 (×4): qty 2

## 2020-10-21 MED ORDER — POLYETHYLENE GLYCOL 3350 17 G PO PACK: 17.0000 g | PACK | Freq: Every day | ORAL | Status: DC | PRN

## 2020-10-21 MED ORDER — SODIUM CHLORIDE 0.9% FLUSH
3.0000 mL | Freq: Two times a day (BID) | INTRAVENOUS | Status: DC
  Administered 2020-10-21 – 2020-10-26 (×8): 3 mL via INTRAVENOUS

## 2020-10-21 NOTE — H&P (Signed)
History and Physical        Hospital Admission Note Date: 10/21/2020  Patient name: [name] Medical record number: [id number] Date of birth: 1933/08/11 Age: 87 y.o. Gender: female  PCP: Shirline Frees, MD    Chief Complaint    Chief Complaint  Patient presents with  . COPD  . Foot Swelling      HPI:   This is a very pleasant 87 year old female with a history of COPD, hypertension, hyperlipidemia who presented to the ED for 1.5 weeks of shortness of breath and leg swelling. Due to increased urinary frequency at night, her PCP stopped her HCTZ about two weeks ago. Since then she noticed increased ankle swelling and shortness of breath. Worse with exertion. Sleeps in a reclined bed but has not increased the angle recently. She tried to see her doctor but there were no available appointments and so was instructed to come to the ED for eval. She tried to wait it out at home but her symptoms only worsened so she came to the ED today. No history of CHF. Distant history of tobacco use. Otherwise, no chest pain, fever, chills or any other complaints.    ED Course: Afebrile, hypertensive, hypoxic (SpO2 85% on room air) placed on 2 L/min. Notable Labs: Na 140, K 4.7, Glucose 105, Bun 17, Cr 0.98, BNP 273, WBC 7.9, Hb 13.8, COVID 19 pending. Notable Imaging: CXR unremarkable. Lasix has been ordered   Vitals:   10/21/20 1145 10/21/20 1219  BP: (!) 115/100 127/75  Pulse: 76 83  Resp: 14 (!) 29  Temp:  97.6 F (36.4 C)  SpO2: 97% 94%     Review of Systems:  Review of Systems  All other systems reviewed and are negative.   Medical/Social/Family History   Past Medical History: Past Medical History:  Diagnosis Date  . Colon polyp   . COPD (chronic obstructive pulmonary disease) (HCC)    MILD  . Hyperlipidemia   . Hypertension   . OA (osteoarthritis)    OF THE KNEES  .  Osteopenia   . Shingles   . Thyroid cyst     Past Surgical History:  Procedure Laterality Date  . BREAST EXCISIONAL BIOPSY Right    benign    Medications: Prior to Admission medications   Medication Sig Start Date End Date Taking? Authorizing Provider  calcium carbonate (OSCAL) 1500 (600 Ca) MG TABS tablet Take 600 mg of elemental calcium by mouth 2 (two) times daily with a meal.   Yes [provider]  Cholecalciferol (VITAMIN D PO) Take 1,000 Units by mouth daily.   Yes [provider]  metoprolol succinate (TOPROL-XL) 50 MG 24 hr tablet Take 50 mg by mouth daily. Take with or immediately following a meal.   Yes [provider]  Multiple Vitamins-Minerals (CENTRUM SILVER PO) Take by mouth.   Yes [provider]  Omega-3 Fatty Acids (FISH OIL) 1000 MG CAPS Take by mouth.   Yes [provider]  simvastatin (ZOCOR) 5 MG tablet Take 5 mg by mouth daily.    [provider]    Allergies:   Allergies  Allergen Reactions  . Simvastatin     Muscle pain  in large doses  . Doxycycline Hyclate Diarrhea and Rash  . Penicillins Rash    Social History:  reports that she has quit smoking. She has never used smokeless tobacco. She reports that she does not drink alcohol and does not use drugs.  Family History: Family History  Problem Relation Age of Onset  . CAD Father   . Breast cancer Neg Hx      Objective   Physical Exam: Blood pressure 127/75, pulse 83, temperature 97.6 F (36.4 C), temperature source Oral, resp. rate (!) 29, height 4' 11.75" (1.518 m), weight 94.3 kg, SpO2 94 %.  Physical Exam Vitals and nursing note reviewed.  Constitutional:      Appearance: Normal appearance.  HENT:     Head: Normocephalic and atraumatic.  Eyes:     Conjunctiva/sclera: Conjunctivae normal.  Cardiovascular:     Rate and Rhythm: Normal rate and regular rhythm.  Pulmonary:     Breath sounds: Rales present.     Comments:  Conversational dyspnea End expiratory wheezes  Abdominal:     General: Abdomen is flat.     Palpations: Abdomen is soft.  Musculoskeletal:     Comments: 1+ bilateral lower extremity edema  Skin:    Coloration: Skin is not jaundiced or pale.  Neurological:     Mental Status: She is alert. Mental status is at baseline.  Psychiatric:        Mood and Affect: Mood normal.        Behavior: Behavior normal.     LABS on Admission: I have personally reviewed all the labs and imaging below    Basic Metabolic Panel: Recent Labs  Lab 10/21/20 0944  NA 140  K 4.7  CL 99  CO2 33*  GLUCOSE 105*  BUN 17  CREATININE 0.98  CALCIUM 9.0   Liver Function Tests: No results for input(s): AST, ALT, ALKPHOS, BILITOT, PROT, ALBUMIN in the last 168 hours. No results for input(s): LIPASE, AMYLASE in the last 168 hours. No results for input(s): AMMONIA in the last 168 hours. CBC: Recent Labs  Lab 10/21/20 0944  WBC 7.9  HGB 13.8  HCT 45.3  MCV 99.6  PLT 153   Cardiac Enzymes: No results for input(s): CKTOTAL, CKMB, CKMBINDEX, TROPONINI in the last 168 hours. BNP: Invalid input(s): POCBNP CBG: No results for input(s): GLUCAP in the last 168 hours.  Radiological Exams on Admission:  DG Chest Port 1 View  Result Date: 10/21/2020 CLINICAL DATA:  Shortness of breath and bilateral lower extremity swelling. EXAM: PORTABLE CHEST 1 VIEW COMPARISON:  PA and lateral chest 08/16/2016. FINDINGS: The lungs are clear. Heart size is normal. No pneumothorax or pleural effusion. Aortic atherosclerosis is noted. No acute or focal bony abnormality. IMPRESSION: No acute disease. Aortic Atherosclerosis (ICD10-I70.0). Electronically Signed   By: Inge Rise M.D.   On: 10/21/2020 09:51   VAS [place] LOWER EXTREMITY VENOUS (DVT) (ONLY [facility] & [facility])  Result Date: 10/21/2020  Lower Venous DVT Study Indications: Swelling.  Risk Factors: None identified. Limitations: Body habitus and poor ultrasound/tissue interface.  Comparison Study: No prior studies. Performing Technologist: Oliver Hum RVT  Examination Guidelines: A complete evaluation includes B-mode imaging, spectral Doppler, color Doppler, and power Doppler as needed of all accessible portions of each vessel. Bilateral testing is considered an integral part of a complete examination. Limited examinations for reoccurring indications may be performed as noted. The reflux portion of the exam is performed with the patient in reverse Trendelenburg.  +---------+---------------+---------+-----------+----------+--------------+ RIGHT  CompressibilityPhasicitySpontaneityPropertiesThrombus Aging +---------+---------------+---------+-----------+----------+--------------+ CFV      Full           Yes      Yes                                 +---------+---------------+---------+-----------+----------+--------------+ SFJ      Full                                                        +---------+---------------+---------+-----------+----------+--------------+ FV Prox  Full                                                        +---------+---------------+---------+-----------+----------+--------------+ FV Mid   Full                                                        +---------+---------------+---------+-----------+----------+--------------+ FV DistalFull                                                        +---------+---------------+---------+-----------+----------+--------------+ PFV      Full                                                        +---------+---------------+---------+-----------+----------+--------------+ POP      Full           Yes      Yes                                 +---------+---------------+---------+-----------+----------+--------------+ PTV      Full                                                        +---------+---------------+---------+-----------+----------+--------------+ PERO      Full                                                        +---------+---------------+---------+-----------+----------+--------------+   +---------+---------------+---------+-----------+----------+--------------+ LEFT     CompressibilityPhasicitySpontaneityPropertiesThrombus Aging +---------+---------------+---------+-----------+----------+--------------+ CFV      Full           Yes      Yes                                 +---------+---------------+---------+-----------+----------+--------------+  SFJ      Full                                                        +---------+---------------+---------+-----------+----------+--------------+ FV Prox  Full                                                        +---------+---------------+---------+-----------+----------+--------------+ FV Mid   Full                                                        +---------+---------------+---------+-----------+----------+--------------+ FV Distal               Yes      Yes                                 +---------+---------------+---------+-----------+----------+--------------+ PFV      Full                                                        +---------+---------------+---------+-----------+----------+--------------+ POP      Full           Yes      Yes                                 +---------+---------------+---------+-----------+----------+--------------+ PTV      Full                                                        +---------+---------------+---------+-----------+----------+--------------+ PERO     Full                                                        +---------+---------------+---------+-----------+----------+--------------+     Summary: RIGHT: - There is no evidence of deep vein thrombosis in the lower extremity. However, portions of this examination were limited- see technologist comments above.  - No cystic structure found in  the popliteal fossa.  LEFT: - There is no evidence of deep vein thrombosis in the lower extremity. However, portions of this examination were limited- see technologist comments above.  - No cystic structure found in the popliteal fossa.  *See table(s) above for measurements and observations.    Preliminary       EKG: normal EKG, normal sinus rhythm   A & P   Principal Problem:   Acute respiratory failure  with hypoxia [facility]) Active Problems:   Essential hypertension   Morbid obesity (HCC)   COPD (chronic obstructive pulmonary disease) (HCC)   Hypertensive urgency   1. Acute hypoxic respiratory failure, suspect new onset CHF a. SpO2 85% on room air, requiring 2 L/min O2 b. Suspected cardiac wheeze, rales, conversational dyspnea and lower extremity edema on exam c. BNP 273 and can be falsely low in morbidly obese patients d. No echo on file e. Likely volume overloaded from discontinuing her HCTZ and possibly exacerbated by diet f. Continue Lasix 20 mg IV twice daily g. Check echo  h. Monitor daily weights and intake/output  2. Hypertension  hypertensive urgency a. Continue home Toprol-XL b. Continue Lasix c. Consider outpatient OSA eval if not already done  3. COPD, not in acute exacerbation a. Not on any home meds  4. Hyperlipidemia a. Continue home statin  5. Morbid obesity Body mass index is 40.96 kg/m. a. Outpatient follow up     DVT prophylaxis: lovenox   Code Status: Partial Code  Diet: heart healthy Family Communication: Admission, patients condition and plan of care including tests being ordered have been discussed with the patient who indicates understanding and agrees with the plan and Code Status.  Disposition Plan: The appropriate patient status for this patient is INPATIENT. Inpatient status is judged to be reasonable and necessary in order to provide the required intensity of service to ensure the patient's safety. The patient's presenting symptoms,  physical exam findings, and initial radiographic and laboratory data in the context of their chronic comorbidities is felt to place them at high risk for further clinical deterioration. Furthermore, it is not anticipated that the patient will be medically stable for discharge from the hospital within 2 midnights of admission. The following factors support the patient status of inpatient.   " The patient's presenting symptoms include shortness of breath. " The worrisome physical exam findings include hypoxia, rales. " The initial radiographic and laboratory data are worrisome because of elevated BNP. " The chronic co-morbidities include Hypertension, HLD.   * I certify that at the point of admission it is my clinical judgment that the patient will require inpatient hospital care spanning beyond 2 midnights from the point of admission due to high intensity of service, high risk for further deterioration and high frequency of surveillance required.*    Consultants  . none  Procedures  . none  Time Spent on Admission: 63 minutes    Harold Hedge, DO [facility]  10/21/2020, 12:39 PM

## 2020-10-21 NOTE — Progress Notes (Signed)
Bilateral lower extremity venous duplex has been completed. Preliminary results can be found in CV Proc through chart review.  Results were given to Dr. Tomi Bamberger.  10/21/20 12:08 PM [name] RVT

## 2020-10-21 NOTE — Plan of Care (Signed)
  Problem: Education: Goal: Knowledge of General Education information will improve Description Including pain rating scale, medication(s)/side effects and non-pharmacologic comfort measures Outcome: Progressing   

## 2020-10-21 NOTE — ED Notes (Signed)
ED TO INPATIENT HANDOFF REPORT  ED Nurse Name and Phone #: [phone]  S Name/Age/Gender [name] 87 y.o. female Room/Bed: WA09/WA09  Code Status   Code Status: Partial Code  Home/SNF/Other Home Patient oriented to: self, place, time and situation Is this baseline? Yes   Triage Complete: Triage complete  Chief Complaint Acute respiratory failure with hypoxia ([place]) [J96.01]  Triage Note Patient c/o SOB and bilateral foot swelling. Patient states her physician took her off of HCTZ and that is when her feet began swelling.    Allergies Allergies  Allergen Reactions  . Ceftin [Cefuroxime] Diarrhea  . Clindamycin/Lincomycin Itching  . Simvastatin     Muscle pain with large dose  . Doxycycline Hyclate Diarrhea and Rash  . Penicillins Rash    Level of Care/Admitting Diagnosis ED Disposition    ED Disposition Condition Comment   Admit  Hospital Area: [facility] [[id number]]  Level of Care: Telemetry [5]  Admit to tele based on following criteria: Acute CHF  May admit patient to [facility] or [facility] if equivalent level of care is available:: Yes  Covid Evaluation: Asymptomatic Screening Protocol (No Symptoms)  Diagnosis: Acute respiratory failure with hypoxia [facility]) [[id number]]  Admitting Physician: Harold Hedge [[id number]]  Attending Physician: Harold Hedge [[id number]]  Estimated length of stay: past midnight tomorrow  Certification:: I certify this patient will need inpatient services for at least 2 midnights       B Medical/Surgery History Past Medical History:  Diagnosis Date  . Colon polyp   . COPD (chronic obstructive pulmonary disease) (HCC)    MILD  . Hyperlipidemia   . Hypertension   . OA (osteoarthritis)    OF THE KNEES  . Osteopenia   . Shingles   . Thyroid cyst    Past Surgical History:  Procedure Laterality Date  . BREAST EXCISIONAL BIOPSY Right    benign     A IV Location/Drains/Wounds Patient Lines/Drains/Airways  Status    Active Line/Drains/Airways    Name Placement date Placement time Site Days   Peripheral IV 10/21/20 Left Antecubital 10/21/20  0900  Antecubital  less than 1          Intake/Output Last 24 hours No intake or output data in the 24 hours ending 10/21/20 1407  Labs/Imaging Results for orders placed or performed during the hospital encounter of 10/21/20 (from the past 48 hour(s))  D-dimer, quantitative     Status: Abnormal   Collection Time: 10/21/20  9:40 AM  Result Value Ref Range   D-Dimer, Quant 0.65 (H) 0.00 - 0.50 ug/mL-FEU    Comment: (NOTE) At the manufacturer cut-off value of 0.5 g/mL FEU, this assay has a negative predictive value of 95-100%.This assay is intended for use in conjunction with a clinical pretest probability (PTP) assessment model to exclude pulmonary embolism (PE) and deep venous thrombosis (DVT) in outpatients suspected of PE or DVT. Results should be correlated with clinical presentation. Performed at [facility], [place] [address], [place], [place] [address]   Basic metabolic panel     Status: Abnormal   Collection Time: 10/21/20  9:44 AM  Result Value Ref Range   Sodium 140 135 - 145 mmol/L   Potassium 4.7 3.5 - 5.1 mmol/L   Chloride 99 98 - 111 mmol/L   CO2 33 (H) 22 - 32 mmol/L   Glucose, Bld 105 (H) 70 - 99 mg/dL    Comment: Glucose reference range applies only to samples taken  after fasting for at least 8 hours.   BUN 17 8 - 23 mg/dL   Creatinine, Ser 0.98 0.44 - 1.00 mg/dL   Calcium 9.0 8.9 - 10.3 mg/dL   GFR, Estimated 57 (L) >60 mL/min    Comment: (NOTE) Calculated using the CKD-EPI Creatinine Equation (2021)    Anion gap 8 5 - 15    Comment: Performed at [facility], [place] [address] [place], [place] [address]  CBC     Status: None   Collection Time: 10/21/20  9:44 AM  Result Value Ref Range   WBC 7.9 4.0 - 10.5 K/uL   RBC 4.55 3.87 - 5.11 MIL/uL   Hemoglobin 13.8 12.0 - 15.0 g/dL   HCT  45.3 36.0 - 46.0 %   MCV 99.6 80.0 - 100.0 fL   MCH 30.3 26.0 - 34.0 pg   MCHC 30.5 30.0 - 36.0 g/dL   RDW 13.2 11.5 - 15.5 %   Platelets 153 150 - 400 K/uL   nRBC 0.0 0.0 - 0.2 %    Comment: Performed at [facility], [place] [address], [place],  [address]  Brain natriuretic peptide     Status: Abnormal   Collection Time: 10/21/20  9:44 AM  Result Value Ref Range   B Natriuretic Peptide 273.5 (H) 0.0 - 100.0 pg/mL    Comment: Performed at [facility], [place] [address], [place],  [address]  Resp Panel by RT-PCR (Flu A&B, Covid) Nasopharyngeal Swab     Status: None   Collection Time: 10/21/20  9:44 AM   Specimen: Nasopharyngeal Swab; Nasopharyngeal(NP) swabs in vial transport medium  Result Value Ref Range   SARS Coronavirus 2 by RT PCR NEGATIVE NEGATIVE    Comment: (NOTE) SARS-CoV-2 target nucleic acids are NOT DETECTED.  The SARS-CoV-2 RNA is generally detectable in upper respiratory specimens during the acute phase of infection. The lowest concentration of SARS-CoV-2 viral copies this assay can detect is 138 copies/mL. A negative result does not preclude SARS-Cov-2 infection and should not be used as the sole basis for treatment or other patient management decisions. A negative result may occur with  improper specimen collection/handling, submission of specimen other than nasopharyngeal swab, presence of viral mutation(s) within the areas targeted by this assay, and inadequate number of viral copies(<138 copies/mL). A negative result must be combined with clinical observations, patient history, and epidemiological information. The expected result is Negative.  Fact Sheet for Patients:  EntrepreneurPulse.com.au  Fact Sheet for Healthcare Providers:  IncredibleEmployment.be  This test is no t yet approved or cleared by the [place] FDA and  has been authorized for detection and/or  diagnosis of SARS-CoV-2 by FDA under an Emergency Use Authorization (EUA). This EUA will remain  in effect (meaning this test can be used) for the duration of the COVID-19 declaration under Section 564(b)(1) of the Act, 21 U.S.C.section 360bbb-3(b)(1), unless the authorization is terminated  or revoked sooner.       Influenza A by PCR NEGATIVE NEGATIVE   Influenza B by PCR NEGATIVE NEGATIVE    Comment: (NOTE) The Xpert Xpress SARS-CoV-2/FLU/RSV plus assay is intended as an aid in the diagnosis of influenza from Nasopharyngeal swab specimens and should not be used as a sole basis for treatment. Nasal washings and aspirates are unacceptable for Xpert Xpress SARS-CoV-2/FLU/RSV testing.  Fact Sheet for Patients: EntrepreneurPulse.com.au  Fact Sheet for Healthcare Providers: IncredibleEmployment.be  This test is not yet approved or cleared by the [place]  FDA and has been authorized for detection and/or diagnosis of SARS-CoV-2 by FDA under an Emergency Use Authorization (EUA). This EUA will remain in effect (meaning this test can be used) for the duration of the COVID-19 declaration under Section 564(b)(1) of the Act, 21 U.S.C. section 360bbb-3(b)(1), unless the authorization is terminated or revoked.  Performed at [facility], [place] [address] [place], [place] [address]   Urinalysis, Routine w reflex microscopic Urine, Catheterized     Status: Abnormal   Collection Time: 10/21/20 12:29 PM  Result Value Ref Range   Color, Urine STRAW (A) YELLOW   APPearance CLEAR CLEAR   Specific Gravity, Urine 1.009 1.005 - 1.030   pH 5.0 5.0 - 8.0   Glucose, UA NEGATIVE NEGATIVE mg/dL   Hgb urine dipstick NEGATIVE NEGATIVE   Bilirubin Urine NEGATIVE NEGATIVE   Ketones, ur NEGATIVE NEGATIVE mg/dL   Protein, ur NEGATIVE NEGATIVE mg/dL   Nitrite NEGATIVE NEGATIVE   Leukocytes,Ua NEGATIVE NEGATIVE    Comment: Performed at [place] [address] [place],  [address]   DG Chest Port 1 View  Result Date: 10/21/2020 CLINICAL DATA:  Shortness of breath and bilateral lower extremity swelling. EXAM: PORTABLE CHEST 1 VIEW COMPARISON:  PA and lateral chest 08/16/2016. FINDINGS: The lungs are clear. Heart size is normal. No pneumothorax or pleural effusion. Aortic atherosclerosis is noted. No acute or focal bony abnormality. IMPRESSION: No acute disease. Aortic Atherosclerosis (ICD10-I70.0). Electronically Signed   By: Inge Rise M.D.   On: 10/21/2020 09:51   VAS [place] LOWER EXTREMITY VENOUS (DVT) (ONLY [facility] & [facility])  Result Date: 10/21/2020  Lower Venous DVT Study Indications: Swelling.  Risk Factors: None identified. Limitations: Body habitus and poor ultrasound/tissue interface. Comparison Study: No prior studies. Performing Technologist: Oliver Hum RVT  Examination Guidelines: A complete evaluation includes B-mode imaging, spectral Doppler, color Doppler, and power Doppler as needed of all accessible portions of each vessel. Bilateral testing is considered an integral part of a complete examination. Limited examinations for reoccurring indications may be performed as noted. The reflux portion of the exam is performed with the patient in reverse Trendelenburg.  +---------+---------------+---------+-----------+----------+--------------+ RIGHT    CompressibilityPhasicitySpontaneityPropertiesThrombus Aging +---------+---------------+---------+-----------+----------+--------------+ CFV      Full           Yes      Yes                                 +---------+---------------+---------+-----------+----------+--------------+ SFJ      Full                                                        +---------+---------------+---------+-----------+----------+--------------+ FV Prox  Full                                                         +---------+---------------+---------+-----------+----------+--------------+ FV Mid   Full                                                        +---------+---------------+---------+-----------+----------+--------------+  FV DistalFull                                                        +---------+---------------+---------+-----------+----------+--------------+ PFV      Full                                                        +---------+---------------+---------+-----------+----------+--------------+ POP      Full           Yes      Yes                                 +---------+---------------+---------+-----------+----------+--------------+ PTV      Full                                                        +---------+---------------+---------+-----------+----------+--------------+ PERO     Full                                                        +---------+---------------+---------+-----------+----------+--------------+   +---------+---------------+---------+-----------+----------+--------------+ LEFT     CompressibilityPhasicitySpontaneityPropertiesThrombus Aging +---------+---------------+---------+-----------+----------+--------------+ CFV      Full           Yes      Yes                                 +---------+---------------+---------+-----------+----------+--------------+ SFJ      Full                                                        +---------+---------------+---------+-----------+----------+--------------+ FV Prox  Full                                                        +---------+---------------+---------+-----------+----------+--------------+ FV Mid   Full                                                        +---------+---------------+---------+-----------+----------+--------------+ FV Distal               Yes      Yes                                  +---------+---------------+---------+-----------+----------+--------------+  PFV      Full                                                        +---------+---------------+---------+-----------+----------+--------------+ POP      Full           Yes      Yes                                 +---------+---------------+---------+-----------+----------+--------------+ PTV      Full                                                        +---------+---------------+---------+-----------+----------+--------------+ PERO     Full                                                        +---------+---------------+---------+-----------+----------+--------------+     Summary: RIGHT: - There is no evidence of deep vein thrombosis in the lower extremity. However, portions of this examination were limited- see technologist comments above.  - No cystic structure found in the popliteal fossa.  LEFT: - There is no evidence of deep vein thrombosis in the lower extremity. However, portions of this examination were limited- see technologist comments above.  - No cystic structure found in the popliteal fossa.  *See table(s) above for measurements and observations.    Preliminary     Pending Labs Unresulted Labs (From admission, onward)          Start     Ordered   10/28/20 0500  Creatinine, serum  (enoxaparin (LOVENOX)    CrCl >/= 30 ml/min)  Weekly,   R     Comments: while on enoxaparin therapy    10/21/20 1229   10/22/20 3016  Basic metabolic panel  Daily,   R      10/21/20 1229   10/22/20 0500  CBC  Daily,   R      10/21/20 1229          Vitals/Pain Today's Vitals   10/21/20 1230 10/21/20 1245 10/21/20 1300 10/21/20 1315  BP: 139/69 (!) 150/47    Pulse: 77 74 73 73  Resp: (!) 21 (!) 21 (!) 21 (!) 25  Temp:      TempSrc:      SpO2: 96% 94% 92% 96%  Weight:      Height:      PainSc:        Isolation Precautions Airborne and Contact precautions  Medications Medications   metoprolol succinate (TOPROL-XL) 24 hr tablet 50 mg (has no administration in time range)  simvastatin (ZOCOR) tablet 5 mg (has no administration in time range)  polyethylene glycol (MIRALAX / GLYCOLAX) packet 17 g (has no administration in time range)  enoxaparin (LOVENOX) injection 40 mg (has no administration in time range)  furosemide (LASIX) injection 20 mg (has no administration in time range)  sodium chloride flush (NS) 0.9 %  injection 3 mL (has no administration in time range)  furosemide (LASIX) injection 20 mg (20 mg Intravenous Given 10/21/20 1157)    Mobility walks Moderate fall risk   Focused Assessments .   R Recommendations: See Admitting Provider Note  Report given to:   Additional Notes: n/a

## 2020-10-21 NOTE — ED Triage Notes (Signed)
Patient c/o SOB and bilateral foot swelling. Patient states her physician took her off of HCTZ and that is when her feet began swelling.

## 2020-10-21 NOTE — ED Provider Notes (Signed)
[facility] DEPT Provider Note   CSN: [id number] Arrival date & time: 10/21/20  0816     History Chief Complaint  Patient presents with  . COPD  . Foot Swelling    [name] is a 87 y.o. female.  HPI   Patient presented to the ED for evaluation of shortness of breath and leg swelling.  Patient states sometime over a week ago she stopped taking her hydrochlorothiazide at the direction of her doctor.  Patient had noticed she was having urinary frequency and having to get up at night.  In the last several days however she has noticed increasing foot swelling and then she started to feel short of breath.  Patient has noticed to get worse when she starts to get up and walk around.  She is not having any fevers or chills.  No chest pain.  Swelling is in both of her legs.  She has not noticed any redness.  Patient called her doctor who did not have any available appointments.  She was instructed to come to the ED here one of the urgent cares to get evaluated.  She finally decided to come today as it was not getting any better.  Past Medical History:  Diagnosis Date  . Colon polyp   . COPD (chronic obstructive pulmonary disease) (HCC)    MILD  . Hyperlipidemia   . Hypertension   . OA (osteoarthritis)    OF THE KNEES  . Osteopenia   . Shingles   . Thyroid cyst     Patient Active Problem List   Diagnosis Date Noted  . Essential hypertension 08/25/2014  . Dyspnea 08/25/2014  . Morbid obesity ([place]) 08/25/2014  . COPD (chronic obstructive pulmonary disease) ([place]) 08/25/2014    Past Surgical History:  Procedure Laterality Date  . BREAST EXCISIONAL BIOPSY Right    benign     OB History   No obstetric history on file.     Family History  Problem Relation Age of Onset  . CAD Father   . Breast cancer Neg Hx     Social History   Tobacco Use  . Smoking status: Former Research scientist (life sciences)  . Smokeless tobacco: Never Used  Vaping Use  . Vaping Use:  Never used  Substance Use Topics  . Alcohol use: Never  . Drug use: Never    Home Medications Prior to Admission medications   Medication Sig Start Date End Date Taking? Authorizing Provider  calcium carbonate (OSCAL) 1500 (600 Ca) MG TABS tablet Take 600 mg of elemental calcium by mouth 2 (two) times daily with a meal.   Yes [provider]  Cholecalciferol (VITAMIN D PO) Take 1,000 Units by mouth daily.   Yes [provider]  metoprolol succinate (TOPROL-XL) 50 MG 24 hr tablet Take 50 mg by mouth daily. Take with or immediately following a meal.   Yes [provider]  Multiple Vitamins-Minerals (CENTRUM SILVER PO) Take by mouth.   Yes [provider]  Omega-3 Fatty Acids (FISH OIL) 1000 MG CAPS Take by mouth.   Yes [provider]  simvastatin (ZOCOR) 5 MG tablet Take 5 mg by mouth daily.    [provider]    Allergies    Simvastatin, Doxycycline hyclate, and Penicillins  Review of Systems   Review of Systems  All other systems reviewed and are negative.   Physical Exam Updated Vital Signs BP (!) 115/100   Pulse 76   Temp 97.6 F (36.4 C) (  Oral)   Resp 14   Ht 1.518 m (4' 11.75")   Wt 94.3 kg   SpO2 97%   BMI 40.96 kg/m   Physical Exam Vitals and nursing note reviewed.  Constitutional:      General: She is not in acute distress.    Appearance: She is well-developed. She is not ill-appearing or diaphoretic.  HENT:     Head: Normocephalic and atraumatic.     Right Ear: External ear normal.     Left Ear: External ear normal.  Eyes:     General: No scleral icterus.       Right eye: No discharge.        Left eye: No discharge.     Conjunctiva/sclera: Conjunctivae normal.  Neck:     Trachea: No tracheal deviation.  Cardiovascular:     Rate and Rhythm: Normal rate and regular rhythm.  Pulmonary:     Effort: Pulmonary effort is normal. No respiratory distress.     Breath sounds: No stridor. Rales present. No  wheezing.     Comments: Crackles bilaterally Abdominal:     General: Bowel sounds are normal. There is no distension.     Palpations: Abdomen is soft.     Tenderness: There is no abdominal tenderness. There is no guarding or rebound.  Musculoskeletal:        General: No tenderness.     Cervical back: Neck supple.     Right lower leg: Edema present.     Left lower leg: Edema present.     Comments: Pitting edema bilateral lower extremities, no tenderness, no erythema  Skin:    General: Skin is warm and dry.     Findings: No rash.  Neurological:     Mental Status: She is alert.     Cranial Nerves: No cranial nerve deficit (no facial droop, extraocular movements intact, no slurred speech).     Sensory: No sensory deficit.     Motor: No abnormal muscle tone or seizure activity.     Coordination: Coordination normal.     ED Results / Procedures / Treatments   Labs (all labs ordered are listed, but only abnormal results are displayed) Labs Reviewed  BASIC METABOLIC PANEL - Abnormal; Notable for the following components:      Result Value   CO2 33 (*)    Glucose, Bld 105 (*)    GFR, Estimated 57 (*)    All other components within normal limits  BRAIN NATRIURETIC PEPTIDE - Abnormal; Notable for the following components:   B Natriuretic Peptide 273.5 (*)    All other components within normal limits  RESP PANEL BY RT-PCR (FLU A&B, COVID) ARPGX2  CBC  URINALYSIS, ROUTINE W REFLEX MICROSCOPIC  D-DIMER, QUANTITATIVE    EKG EKG Interpretation  Date/Time:  Friday October 21 2020 10:17:53 EST Ventricular Rate:  81 PR Interval:    QRS Duration: 79 QT Interval:  385 QTC Calculation: 447 R Axis:   58 Text Interpretation: Sinus rhythm Low voltage, precordial leads No old tracing to compare Confirmed by Dorie Rank [id number]) on 10/21/2020 10:30:31 AM   Radiology DG Chest Port 1 View  Result Date: 10/21/2020 CLINICAL DATA:  Shortness of breath and bilateral lower extremity swelling.  EXAM: PORTABLE CHEST 1 VIEW COMPARISON:  PA and lateral chest 08/16/2016. FINDINGS: The lungs are clear. Heart size is normal. No pneumothorax or pleural effusion. Aortic atherosclerosis is noted. No acute or focal bony abnormality. IMPRESSION: No acute disease. Aortic Atherosclerosis (ICD10-I70.0). Electronically Signed  By: Inge Rise M.D.   On: 10/21/2020 09:51    Procedures Procedures   Medications Ordered in ED Medications  furosemide (LASIX) injection 20 mg (has no administration in time range)    ED Course  I have reviewed the triage vital signs and the nursing notes.  Pertinent labs & imaging results that were available during my care of the patient were reviewed by me and considered in my medical decision making (see chart for details).  Clinical Course as of 10/21/20 1156  Fri Oct 21, 2020  0924 New oxygen requirement noted on initial vital signs [JK]  1119 BNP slightly elevated.  Metabolic panel normal. [JK]  1119 X-ray without acute finding. [DD]  2202 Doppler studies negative for DVT [JK]    Clinical Course User Index [JK] Dorie Rank, MD   MDM Rules/Calculators/A&P                          Patient presented to ED with complaints of shortness of breath as well as leg swelling.  Presentation concerning for the possibility of acute CHF.  Chest x-ray ever does not show any definite pulmonary edema.  She does not have pneumonia.  I did hear crackles on exam suspect she does have pulmonary edema.  PE is a consideration although less likely.  I have added on a D-dimer test.  Her Doppler studies of her lower extremities are negative.  Covid test is also pending, the patient has been fully vaccinated and boosted I find covid less likely.  With her new oxygen requirements I will consult the medical service for further evaluation and treatment.  I ordered a dose of Lasix for diuresis. Final Clinical Impression(s) / ED Diagnoses Final diagnoses:  Shortness of breath  Leg  edema      Dorie Rank, MD 10/21/20 1156

## 2020-10-22 ENCOUNTER — Inpatient Hospital Stay (HOSPITAL_COMMUNITY): Payer: Medicare Other

## 2020-10-22 DIAGNOSIS — R600 Localized edema: Secondary | ICD-10-CM

## 2020-10-22 DIAGNOSIS — R0602 Shortness of breath: Secondary | ICD-10-CM | POA: Diagnosis not present

## 2020-10-22 LAB — ECHOCARDIOGRAM COMPLETE
Area-P 1/2: 4.68 cm2
Calc EF: 70.2 %
Height: 59.75 in
S' Lateral: 3 cm
Single Plane A2C EF: 69.6 %
Single Plane A4C EF: 72.4 %
Weight: 3328 oz

## 2020-10-22 LAB — COMPREHENSIVE METABOLIC PANEL
ALT: 26 U/L (ref 0–44)
AST: 21 U/L (ref 15–41)
Albumin: 3.6 g/dL (ref 3.5–5.0)
Alkaline Phosphatase: 52 U/L (ref 38–126)
Anion gap: 13 (ref 5–15)
BUN: 27 mg/dL — ABNORMAL HIGH (ref 8–23)
CO2: 33 mmol/L — ABNORMAL HIGH (ref 22–32)
Calcium: 8.8 mg/dL — ABNORMAL LOW (ref 8.9–10.3)
Chloride: 94 mmol/L — ABNORMAL LOW (ref 98–111)
Creatinine, Ser: 1.1 mg/dL — ABNORMAL HIGH (ref 0.44–1.00)
GFR, Estimated: 49 mL/min — ABNORMAL LOW (ref >60)
Glucose, Bld: 136 mg/dL — ABNORMAL HIGH (ref 70–99)
Potassium: 5 mmol/L (ref 3.5–5.1)
Sodium: 140 mmol/L (ref 135–145)
Total Bilirubin: 0.6 mg/dL (ref 0.3–1.2)
Total Protein: 6.8 g/dL (ref 6.5–8.1)

## 2020-10-22 LAB — BASIC METABOLIC PANEL
Anion gap: 11 (ref 5–15)
BUN: 25 mg/dL — ABNORMAL HIGH (ref 8–23)
CO2: 34 mmol/L — ABNORMAL HIGH (ref 22–32)
Calcium: 8.9 mg/dL (ref 8.9–10.3)
Chloride: 97 mmol/L — ABNORMAL LOW (ref 98–111)
Creatinine, Ser: 1.04 mg/dL — ABNORMAL HIGH (ref 0.44–1.00)
GFR, Estimated: 53 mL/min — ABNORMAL LOW (ref >60)
Glucose, Bld: 136 mg/dL — ABNORMAL HIGH (ref 70–99)
Potassium: 4.9 mmol/L (ref 3.5–5.1)
Sodium: 142 mmol/L (ref 135–145)

## 2020-10-22 LAB — CBC
HCT: 47.9 % — ABNORMAL HIGH (ref 36.0–46.0)
Hemoglobin: 13.8 g/dL (ref 12.0–15.0)
MCH: 29.5 pg (ref 26.0–34.0)
MCHC: 28.8 g/dL — ABNORMAL LOW (ref 30.0–36.0)
MCV: 102.4 fL — ABNORMAL HIGH (ref 80.0–100.0)
Platelets: 163 10*3/uL (ref 150–400)
RBC: 4.68 MIL/uL (ref 3.87–5.11)
RDW: 13.2 % (ref 11.5–15.5)
WBC: 7.8 10*3/uL (ref 4.0–10.5)
nRBC: 0 % (ref 0.0–0.2)

## 2020-10-22 LAB — GLUCOSE, CAPILLARY
Glucose-Capillary: 131 mg/dL — ABNORMAL HIGH (ref 70–99)
Glucose-Capillary: 136 mg/dL — ABNORMAL HIGH (ref 70–99)

## 2020-10-22 LAB — MAGNESIUM: Magnesium: 2.3 mg/dL (ref 1.7–2.4)

## 2020-10-22 MED ORDER — ACETAMINOPHEN 325 MG PO TABS: 650.0000 mg | ORAL_TABLET | ORAL | Status: DC | PRN

## 2020-10-22 NOTE — Progress Notes (Signed)
During shift change report, patient appeared to be confused and shaky. Patient's sister Dillon Bjork was on the phone, and spoke to this RN. Sister expressed concern that patient doesn't seem to be coherent, she spoke to her earlier this morning and she was okay. Sister would also like to speak to MD.  Patient was repositioned and placed on upright, dinner tray was set up. Patient started to be more conversant. She was able to answer the orientation questions. Will continue to monitor.

## 2020-10-22 NOTE — Progress Notes (Signed)
Called by RR with concerns for new onset of tremors. It is presenting as kinetic tremor. She denies any N/V. SOB, pain/discomfort. She is currently lucid and answering questions appropriately. She states that this has never happened before but it's not causing her discomfort at this time. This does not appear to be medication induced. This may be stress related. At this time, we will continue to observe. Neurochecks q4hr.   Lovey Newcomer, NP [facility] 7p-7a [phone]

## 2020-10-22 NOTE — Progress Notes (Signed)
[facility]                                                                              Patient Demographics  [name], is a 87 y.o. female, DOB - May 09, 1933, XLK:[id number]  Admit date - 10/21/2020   Admitting Physician Harold Hedge, MD  Outpatient Primary MD for the patient is Shirline Frees, MD  Outpatient specialists:   LOS - 1  days   Medical records reviewed and are as summarized below:    Chief Complaint  Patient presents with  . COPD  . Foot Swelling       Brief summary   Patient is 87 year old female with history of COPD, hypertension, hyperlipidemia presented with1.5 weeks of shortness of breath and leg swelling. Due to increased urinary frequency at night, her PCP stopped her HCTZ about two weeks ago. Since then she noticed increased ankle swelling and shortness of breath. Worse with exertion. Sleeps in a reclined bed but has not increased the angle recently. She tried to see her doctor but there were no available appointments and so was instructed to come to the ED for eval. No prior history of CHF, distant history of tobacco use. BNP 273.5 D-dimer 0.65 Doppler ultrasound lower extremities negative for DVT Chest x-ray showed no acute disease  Assessment & Plan    Principal Problem:   Acute respiratory failure with hypoxia ([employer]), suspecting new onset CHF -Possibly multifactorial with COPD, may have OSA/OHV  -O2 sats 85% on room air, was placed on 2 L, BNP 273 -Placed on Lasix 20 mg IV twice daily, strict I's and O's and daily weights -D-dimer was slightly elevated 0.65, Doppler ultrasound lower extremities negative for DVT -Negative balance of 800 cc.  - If no significant improvement in her symptoms, will check a CTA to rule out any PE -Likely needs outpatient sleep study to rule out OSA -Follow 2D echo  Active Problems:   Essential hypertension -BP now stable, continue Toprol-XL, Lasix     COPD (chronic obstructive pulmonary  disease) (HCC) -Currently no acute wheezing  Hyperlipidemia - Continue statin    Morbid obesity Estimated body mass index is 40.96 kg/m as calculated from the following:   Height as of this encounter: 4' 11.75" (1.518 m).   Weight as of this encounter: 94.3 kg.  Code Status: Full CODE STATUS DVT Prophylaxis:  enoxaparin (LOVENOX) injection 40 mg Start: 10/21/20 2000   Level of Care: Level of care: Telemetry Family Communication: Discussed all imaging results, lab results, explained to the patient    Disposition Plan:     Status is: Inpatient  Remains inpatient appropriate because:Inpatient level of care appropriate due to severity of illness   Dispo: The patient is from: Home              Anticipated d/c is to: Home              Patient currently is not medically stable to d/c.  On IV Lasix diuresis   Difficult to place patient No      Time Spent in minutes   35 minutes  Procedures:  2D echo  Consultants:   None  Antimicrobials:   Anti-infectives (From admission, onward)   None          Medications  Scheduled Meds: . enoxaparin (LOVENOX) injection  40 mg Subcutaneous Q24H  . furosemide  20 mg Intravenous BID  . metoprolol succinate  75 mg Oral Daily  . simvastatin  5 mg Oral Daily  . sodium chloride flush  3 mL Intravenous Q12H   Continuous Infusions: PRN Meds:.polyethylene glycol      Subjective:   [name] was seen and examined today.  Sleepy, states no acute issues.  No fevers or chills, ongoing nausea vomiting.  No acute overnight events  Objective:   Vitals:   10/21/20 2042 10/22/20 0004 10/22/20 0351 10/22/20 0400  BP: (!) 123/54  135/79 136/89  Pulse: 69 88 87 78  Resp: (!) 22   19  Temp: 98.4 F (36.9 C)   97.7 F (36.5 C)  TempSrc:    Oral  SpO2: 97% 97% (!) 84% 94%  Weight:      Height:        Intake/Output Summary (Last 24 hours) at 10/22/2020 1003 Last data filed at 10/22/2020 0000 Gross per 24 hour  Intake 150  ml  Output 950 ml  Net -800 ml     Wt Readings from Last 3 Encounters:  10/21/20 94.3 kg  08/25/14 91.2 kg  10/14/13 86.2 kg     Exam  General: Sleepy but arousable, states she is okay  Cardiovascular: S1 S2 auscultated, no murmurs, RRR  Respiratory: Bibasilar crackles  Gastrointestinal: Soft, nontender, nondistended, + bowel sounds  Ext: 1+ pedal edema bilaterally  Neuro:  Musculoskeletal: No digital cyanosis, clubbing  Skin: No rashes  Psych: Sleepy but arousable   Data Reviewed:  I have personally reviewed following labs and imaging studies  Micro Results Recent Results (from the past 240 hour(s))  Resp Panel by RT-PCR (Flu A&B, Covid) Nasopharyngeal Swab     Status: None   Collection Time: 10/21/20  9:44 AM   Specimen: Nasopharyngeal Swab; Nasopharyngeal(NP) swabs in vial transport medium  Result Value Ref Range Status   SARS Coronavirus 2 by RT PCR NEGATIVE NEGATIVE Final    Comment: (NOTE) SARS-CoV-2 target nucleic acids are NOT DETECTED.  The SARS-CoV-2 RNA is generally detectable in upper respiratory specimens during the acute phase of infection. The lowest concentration of SARS-CoV-2 viral copies this assay can detect is 138 copies/mL. A negative result does not preclude SARS-Cov-2 infection and should not be used as the sole basis for treatment or other patient management decisions. A negative result may occur with  improper specimen collection/handling, submission of specimen other than nasopharyngeal swab, presence of viral mutation(s) within the areas targeted by this assay, and inadequate number of viral copies(<138 copies/mL). A negative result must be combined with clinical observations, patient history, and epidemiological information. The expected result is Negative.  Fact Sheet for Patients:  EntrepreneurPulse.com.au  Fact Sheet for Healthcare Providers:  IncredibleEmployment.be  This test is no t  yet approved or cleared by the [place] FDA and  has been authorized for detection and/or diagnosis of SARS-CoV-2 by FDA under an Emergency Use Authorization (EUA). This EUA will remain  in effect (meaning this test can be used) for the duration of the COVID-19 declaration under Section 564(b)(1) of the Act, 21 U.S.C.section 360bbb-3(b)(1), unless the authorization is terminated  or revoked sooner.       Influenza A by PCR NEGATIVE NEGATIVE Final   Influenza B  by PCR NEGATIVE NEGATIVE Final    Comment: (NOTE) The Xpert Xpress SARS-CoV-2/FLU/RSV plus assay is intended as an aid in the diagnosis of influenza from Nasopharyngeal swab specimens and should not be used as a sole basis for treatment. Nasal washings and aspirates are unacceptable for Xpert Xpress SARS-CoV-2/FLU/RSV testing.  Fact Sheet for Patients: EntrepreneurPulse.com.au  Fact Sheet for Healthcare Providers: IncredibleEmployment.be  This test is not yet approved or cleared by the [place] FDA and has been authorized for detection and/or diagnosis of SARS-CoV-2 by FDA under an Emergency Use Authorization (EUA). This EUA will remain in effect (meaning this test can be used) for the duration of the COVID-19 declaration under Section 564(b)(1) of the Act, 21 U.S.C. section 360bbb-3(b)(1), unless the authorization is terminated or revoked.  Performed at [facility], [place] [address] [place], [place] [address]     Radiology Reports DG Chest [place] 1 View  Result Date: 10/21/2020 CLINICAL DATA:  Shortness of breath and bilateral lower extremity swelling. EXAM: PORTABLE CHEST 1 VIEW COMPARISON:  PA and lateral chest 08/16/2016. FINDINGS: The lungs are clear. Heart size is normal. No pneumothorax or pleural effusion. Aortic atherosclerosis is noted. No acute or focal bony abnormality. IMPRESSION: No acute disease. Aortic Atherosclerosis (ICD10-I70.0).  Electronically Signed   By: Inge Rise M.D.   On: 10/21/2020 09:51   VAS [place] LOWER EXTREMITY VENOUS (DVT) (ONLY [facility] & [facility])  Result Date: 10/21/2020  Lower Venous DVT Study Indications: Swelling.  Risk Factors: None identified. Limitations: Body habitus and poor ultrasound/tissue interface. Comparison Study: No prior studies. Performing Technologist: Oliver Hum RVT  Examination Guidelines: A complete evaluation includes B-mode imaging, spectral Doppler, color Doppler, and power Doppler as needed of all accessible portions of each vessel. Bilateral testing is considered an integral part of a complete examination. Limited examinations for reoccurring indications may be performed as noted. The reflux portion of the exam is performed with the patient in reverse Trendelenburg.  +---------+---------------+---------+-----------+----------+--------------+ RIGHT    CompressibilityPhasicitySpontaneityPropertiesThrombus Aging +---------+---------------+---------+-----------+----------+--------------+ CFV      Full           Yes      Yes                                 +---------+---------------+---------+-----------+----------+--------------+ SFJ      Full                                                        +---------+---------------+---------+-----------+----------+--------------+ FV Prox  Full                                                        +---------+---------------+---------+-----------+----------+--------------+ FV Mid   Full                                                        +---------+---------------+---------+-----------+----------+--------------+ FV DistalFull                                                        +---------+---------------+---------+-----------+----------+--------------+  PFV      Full                                                        +---------+---------------+---------+-----------+----------+--------------+ POP       Full           Yes      Yes                                 +---------+---------------+---------+-----------+----------+--------------+ PTV      Full                                                        +---------+---------------+---------+-----------+----------+--------------+ PERO     Full                                                        +---------+---------------+---------+-----------+----------+--------------+   +---------+---------------+---------+-----------+----------+--------------+ LEFT     CompressibilityPhasicitySpontaneityPropertiesThrombus Aging +---------+---------------+---------+-----------+----------+--------------+ CFV      Full           Yes      Yes                                 +---------+---------------+---------+-----------+----------+--------------+ SFJ      Full                                                        +---------+---------------+---------+-----------+----------+--------------+ FV Prox  Full                                                        +---------+---------------+---------+-----------+----------+--------------+ FV Mid   Full                                                        +---------+---------------+---------+-----------+----------+--------------+ FV Distal               Yes      Yes                                 +---------+---------------+---------+-----------+----------+--------------+ PFV      Full                                                        +---------+---------------+---------+-----------+----------+--------------+  POP      Full           Yes      Yes                                 +---------+---------------+---------+-----------+----------+--------------+ PTV      Full                                                        +---------+---------------+---------+-----------+----------+--------------+ PERO     Full                                                         +---------+---------------+---------+-----------+----------+--------------+     Summary: RIGHT: - There is no evidence of deep vein thrombosis in the lower extremity. However, portions of this examination were limited- see technologist comments above.  - No cystic structure found in the popliteal fossa.  LEFT: - There is no evidence of deep vein thrombosis in the lower extremity. However, portions of this examination were limited- see technologist comments above.  - No cystic structure found in the popliteal fossa.  *See table(s) above for measurements and observations.    Preliminary     Lab Data:  CBC: Recent Labs  Lab 10/21/20 0944 10/22/20 0436  WBC 7.9 7.8  HGB 13.8 13.8  HCT 45.3 47.9*  MCV 99.6 102.4*  PLT 153 329   Basic Metabolic Panel: Recent Labs  Lab 10/21/20 0944 10/22/20 0436  NA 140 142  K 4.7 4.9  CL 99 97*  CO2 33* 34*  GLUCOSE 105* 136*  BUN 17 25*  CREATININE 0.98 1.04*  CALCIUM 9.0 8.9   GFR: Estimated Creatinine Clearance: 40.4 mL/min (A) (by C-G formula based on SCr of 1.04 mg/dL (H)). Liver Function Tests: No results for input(s): AST, ALT, ALKPHOS, BILITOT, PROT, ALBUMIN in the last 168 hours. No results for input(s): LIPASE, AMYLASE in the last 168 hours. No results for input(s): AMMONIA in the last 168 hours. Coagulation Profile: No results for input(s): INR, PROTIME in the last 168 hours. Cardiac Enzymes: No results for input(s): CKTOTAL, CKMB, CKMBINDEX, TROPONINI in the last 168 hours. BNP (last 3 results) No results for input(s): PROBNP in the last 8760 hours. HbA1C: No results for input(s): HGBA1C in the last 72 hours. CBG: Recent Labs  Lab 10/22/20 0356  GLUCAP 131*   Lipid Profile: No results for input(s): CHOL, HDL, LDLCALC, TRIG, CHOLHDL, LDLDIRECT in the last 72 hours. Thyroid Function Tests: No results for input(s): TSH, T4TOTAL, FREET4, T3FREE, THYROIDAB in the last 72 hours. Anemia Panel: No results for  input(s): VITAMINB12, FOLATE, FERRITIN, TIBC, IRON, RETICCTPCT in the last 72 hours. Urine analysis:    Component Value Date/Time   COLORURINE STRAW (A) 10/21/2020 1229   APPEARANCEUR CLEAR 10/21/2020 1229   LABSPEC 1.009 10/21/2020 1229   PHURINE 5.0 10/21/2020 1229   GLUCOSEU NEGATIVE 10/21/2020 1229   HGBUR NEGATIVE 10/21/2020 1229   BILIRUBINUR NEGATIVE 10/21/2020 1229   KETONESUR NEGATIVE 10/21/2020 1229   PROTEINUR NEGATIVE 10/21/2020 1229   UROBILINOGEN 0.2 01/27/2009 1018   NITRITE NEGATIVE  10/21/2020 [place] 10/21/2020 1229     Verlyn Dannenberg M.D. [facility] 10/22/2020, 10:03 AM  Available via Epic secure chat 7am-7pm After 7 pm, please refer to night coverage provider listed on amion.

## 2020-10-22 NOTE — Progress Notes (Signed)
PT Cancellation Note  Patient Details Name: [name] MRN: [id number] DOB: 09-12-1932   Cancelled Treatment:    Reason Eval/Treat Not Completed: Fatigue/lethargy limiting ability to participate. Pt too lethargic and couldn't stay awake to answer any questions. Nursing aware.  Will check back as schedule permits.   Galen Manila 10/22/2020, 1:55 PM

## 2020-10-22 NOTE — Plan of Care (Signed)

## 2020-10-22 NOTE — Progress Notes (Signed)
Patient's Sister Dillon Bjork and nephew Elta Guadeloupe spoke to this RN with patients approval. They would like to speak to the MD for updates and plans tomorrow.

## 2020-10-22 NOTE — Progress Notes (Signed)
  Echocardiogram 2D Echocardiogram has been performed.  [name] 10/22/2020, 9:50 AM

## 2020-10-22 NOTE — Progress Notes (Signed)
Patient continues to be shaky. Patient claimed the she feels okay. VS table. Charge nurse notified. Rapid nurse notified for reassessment.

## 2020-10-23 ENCOUNTER — Inpatient Hospital Stay (HOSPITAL_COMMUNITY): Payer: Medicare Other

## 2020-10-23 DIAGNOSIS — J410 Simple chronic bronchitis: Secondary | ICD-10-CM

## 2020-10-23 DIAGNOSIS — E8779 Other fluid overload: Secondary | ICD-10-CM

## 2020-10-23 DIAGNOSIS — I160 Hypertensive urgency: Secondary | ICD-10-CM

## 2020-10-23 DIAGNOSIS — I639 Cerebral infarction, unspecified: Secondary | ICD-10-CM

## 2020-10-23 DIAGNOSIS — J42 Unspecified chronic bronchitis: Secondary | ICD-10-CM | POA: Diagnosis not present

## 2020-10-23 DIAGNOSIS — J9601 Acute respiratory failure with hypoxia: Secondary | ICD-10-CM | POA: Diagnosis not present

## 2020-10-23 DIAGNOSIS — R251 Tremor, unspecified: Secondary | ICD-10-CM

## 2020-10-23 LAB — BASIC METABOLIC PANEL
Anion gap: 11 (ref 5–15)
BUN: 29 mg/dL — ABNORMAL HIGH (ref 8–23)
CO2: 36 mmol/L — ABNORMAL HIGH (ref 22–32)
Calcium: 8.9 mg/dL (ref 8.9–10.3)
Chloride: 92 mmol/L — ABNORMAL LOW (ref 98–111)
Creatinine, Ser: 1.02 mg/dL — ABNORMAL HIGH (ref 0.44–1.00)
GFR, Estimated: 54 mL/min — ABNORMAL LOW (ref >60)
Glucose, Bld: 132 mg/dL — ABNORMAL HIGH (ref 70–99)
Potassium: 4.8 mmol/L (ref 3.5–5.1)
Sodium: 139 mmol/L (ref 135–145)

## 2020-10-23 LAB — TSH: TSH: 0.54 u[IU]/mL (ref 0.350–4.500)

## 2020-10-23 LAB — CBC
HCT: 47.4 % — ABNORMAL HIGH (ref 36.0–46.0)
Hemoglobin: 14 g/dL (ref 12.0–15.0)
MCH: 30.4 pg (ref 26.0–34.0)
MCHC: 29.5 g/dL — ABNORMAL LOW (ref 30.0–36.0)
MCV: 102.8 fL — ABNORMAL HIGH (ref 80.0–100.0)
Platelets: 163 10*3/uL (ref 150–400)
RBC: 4.61 MIL/uL (ref 3.87–5.11)
RDW: 12.8 % (ref 11.5–15.5)
WBC: 9.2 10*3/uL (ref 4.0–10.5)
nRBC: 0 % (ref 0.0–0.2)

## 2020-10-23 LAB — RAPID URINE DRUG SCREEN, HOSP PERFORMED
Amphetamines: NOT DETECTED
Barbiturates: NOT DETECTED
Benzodiazepines: NOT DETECTED
Cocaine: NOT DETECTED
Opiates: NOT DETECTED
Tetrahydrocannabinol: NOT DETECTED

## 2020-10-23 LAB — GLUCOSE, CAPILLARY: Glucose-Capillary: 139 mg/dL — ABNORMAL HIGH (ref 70–99)

## 2020-10-23 LAB — VITAMIN B12: Vitamin B-12: 571 pg/mL (ref 180–914)

## 2020-10-23 LAB — AMMONIA: Ammonia: 26 umol/L (ref 9–35)

## 2020-10-23 LAB — FOLATE: Folate: 14.9 ng/mL (ref >5.9)

## 2020-10-23 IMAGING — DX DG CHEST 1V PORT
1 series · 1 of 1 positions shown · non-contrast
Comparison: [DATE]

CLINICAL DATA: Post intubation

EXAM:
PORTABLE CHEST 1 VIEW

[chest ap]
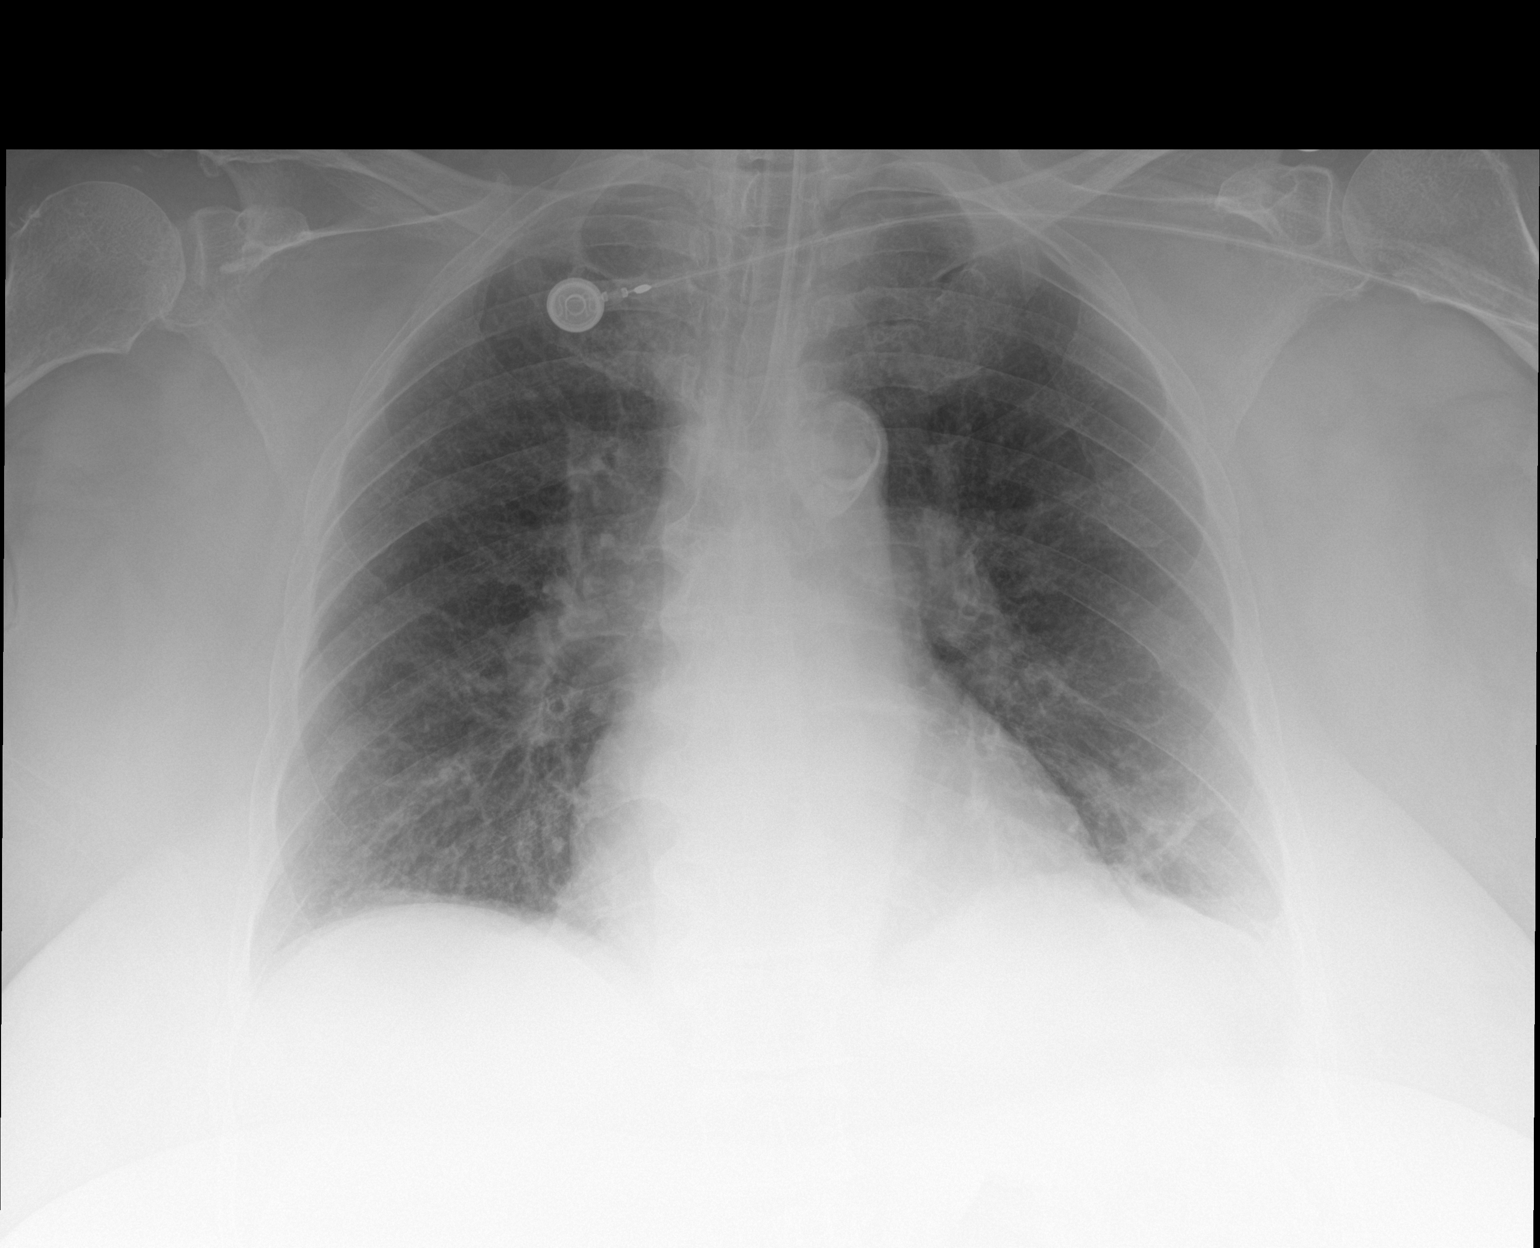

[1 of 1 positions shown; findings below may reference images not displayed]

FINDINGS: Interval intubation, tip of the endotracheal tube about 1.7 cm
superior to the carina. Patchy airspace disease at the left base. No
pleural effusion. Normal heart size. Aortic atherosclerosis. No
pneumothorax.
IMPRESSION: Endotracheal tube tip about 1.7 cm superior to the carina. Patchy
airspace disease at the left base, atelectasis versus minimal
pneumonia versus aspiration

## 2020-10-23 IMAGING — DX DG CHEST 1V PORT
1 series · 1 of 1 positions shown · non-contrast
Comparison: [DATE]

CLINICAL DATA: Intubated, CVA

EXAM:
PORTABLE CHEST 1 VIEW

[chest ap]
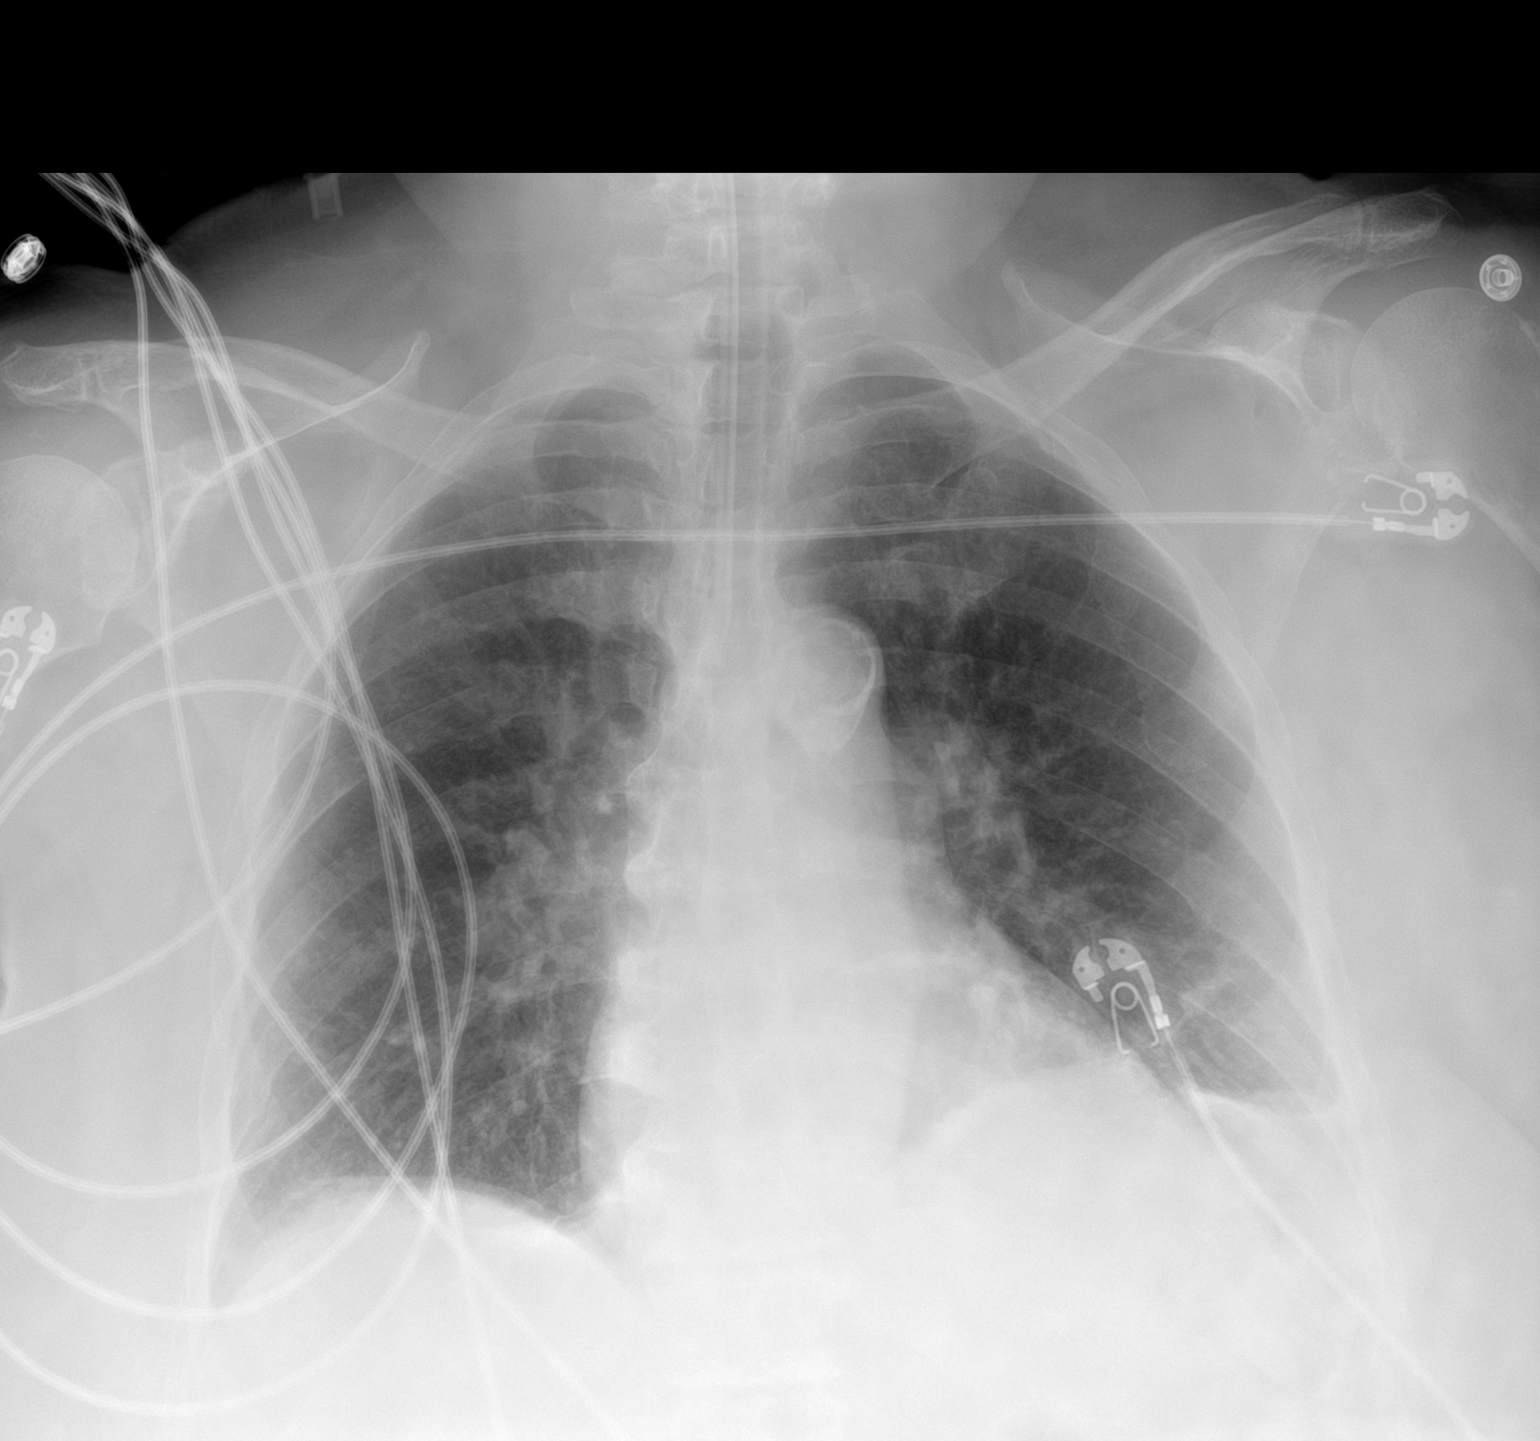

[1 of 1 positions shown; findings below may reference images not displayed]

FINDINGS: Single frontal view of the chest demonstrates endotracheal tube
overlying tracheal air column, tip approximately 2 cm above carina.
Cardiac silhouette is unremarkable. Patchy consolidation at the left
lung base unchanged, likely atelectasis. No effusion or
pneumothorax. No acute bony abnormalities.
IMPRESSION: 1. Minimal left basilar consolidation, favor atelectasis, stable.
2. Unremarkable endotracheal tube.

## 2020-10-23 IMAGING — MR MR HEAD W/O CM
9 of 10 series · 40 of 48 positions shown · non-contrast
Comparison: Head CT [DATE]

CLINICAL DATA: Acute confusion with tremors.

EXAM:
MRI HEAD WITHOUT CONTRAST
TECHNIQUE: Multiplanar, multiecho pulse sequences of the brain and surrounding
structures were obtained without intravenous contrast.

[Series 7: T2 · sagittal · 5.0mm · 0.47mm/px · 4 of 26 slices shown (1 of 3)]
[im 1/26]
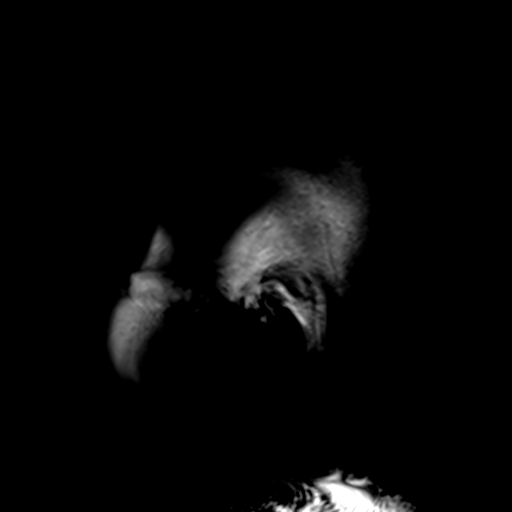
[im 9/26]
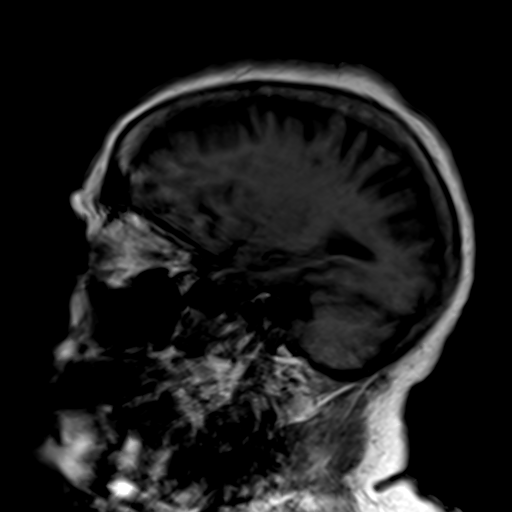
[im 17/26]
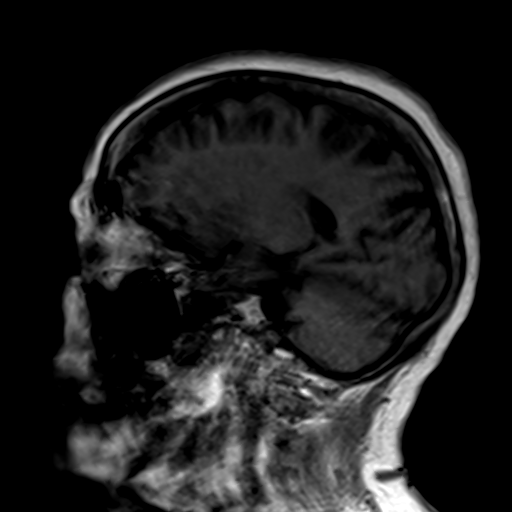
[im 26/26]
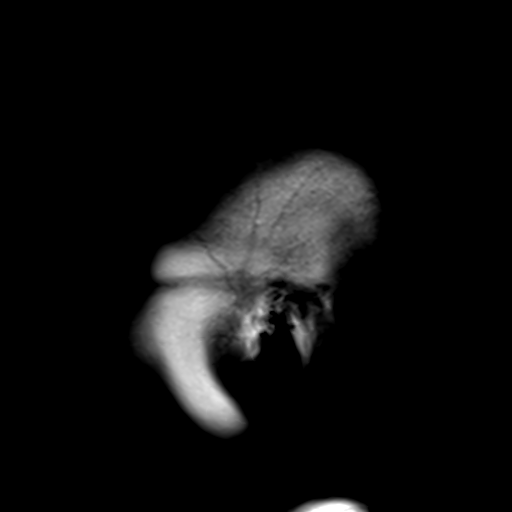

[Series 8: T2 · axial · 5.0mm · 0.45mm/px · z∈[-44,+104]mm · 2 of 25 slices shown (2 of 3)]
[im 1/25]
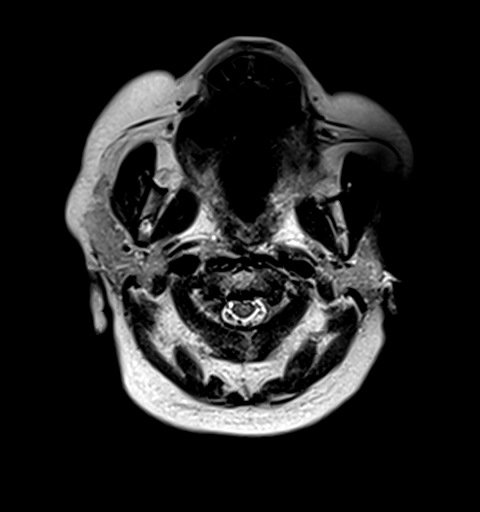
[im 25/25]
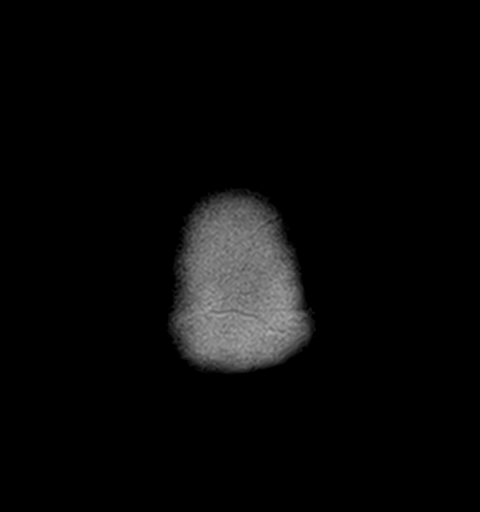

[Series 9: GRE · axial · 3.0mm · 0.45mm/px · z∈[-41,+105]mm · 5 of 53 slices shown]
[im 1/53]
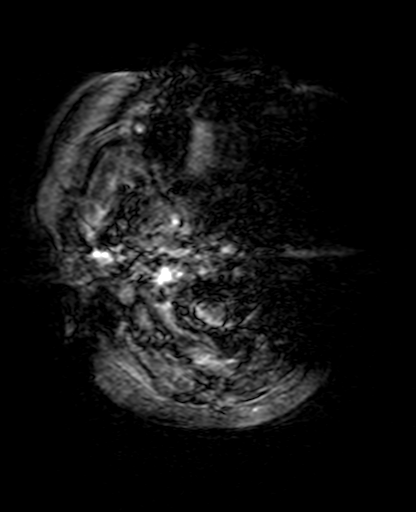
[im 14/53]
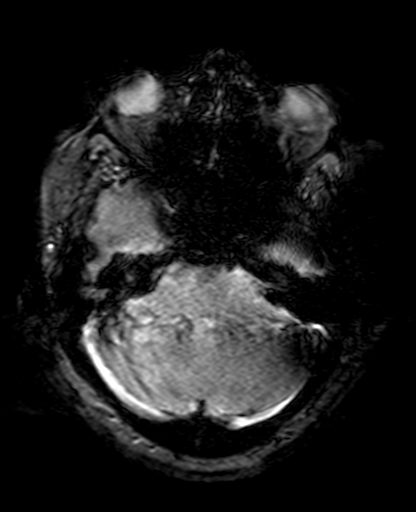
[im 27/53]
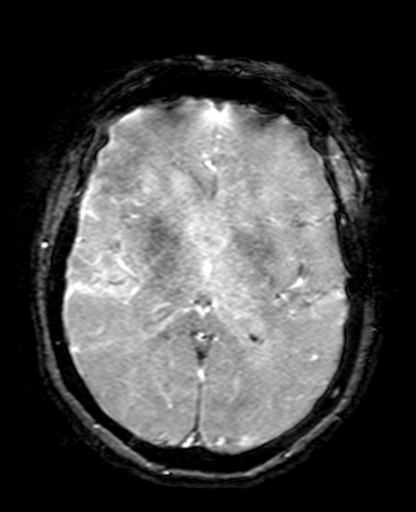
[im 40/53]
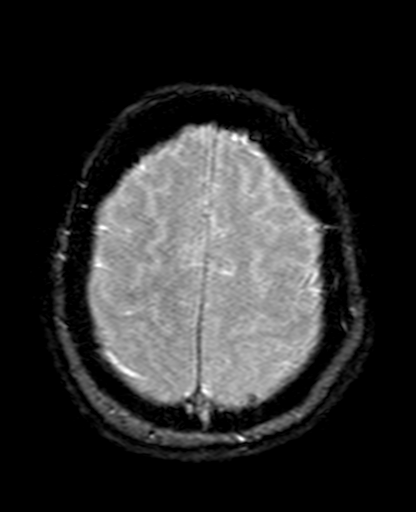
[im 53/53]
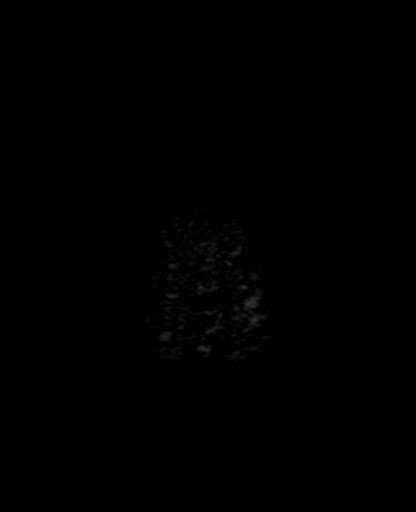

[Series 11: T1 · axial · 3.0mm · 0.45mm/px · z∈[-41,+105]mm · 5 of 53 slices shown]
[im 1/53]
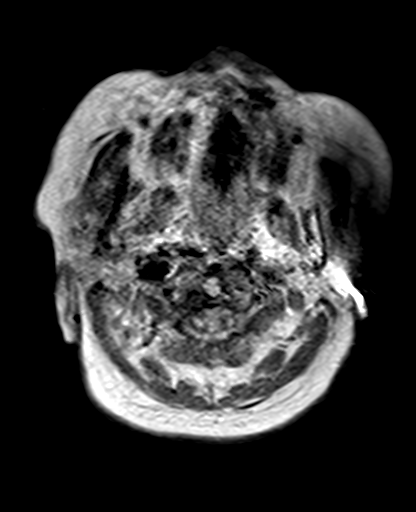
[im 14/53]
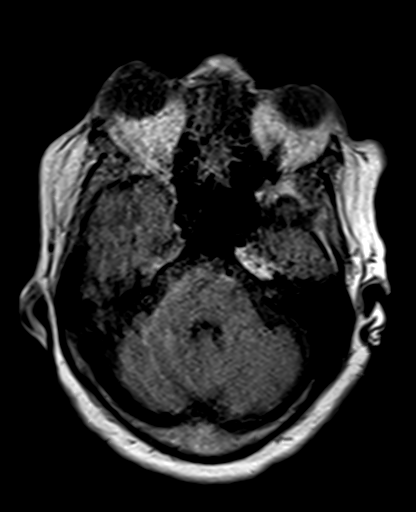
[im 27/53]
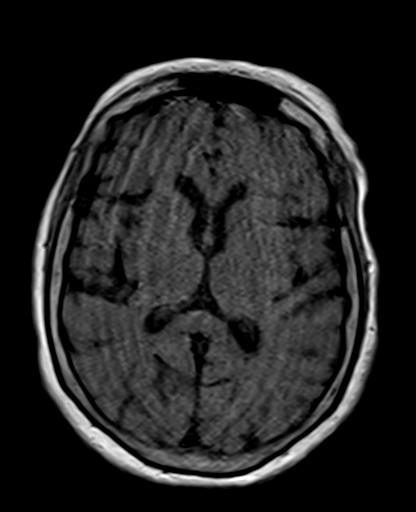
[im 40/53]
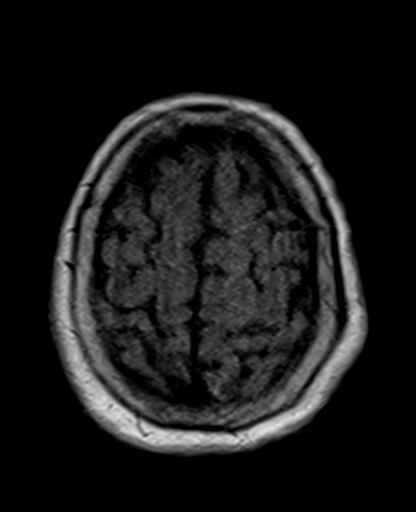
[im 53/53]
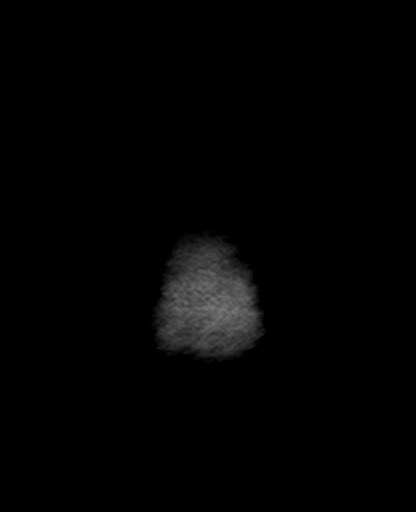

[Series 14: T2 · coronal · 5.0mm · 0.86mm/px · 3 of 31 slices shown (3 of 3)]
[im 1/31]
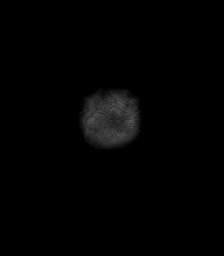
[im 16/31]
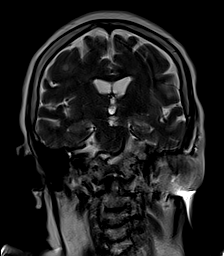
[im 31/31]
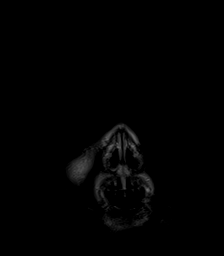

[Series 15: DWI · coronal · 5.0mm · 1.31mm/px · 6 of 64 slices shown (1 of 2)]
[im 1/64]
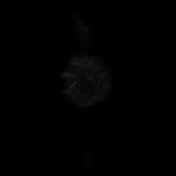
[im 13/64]
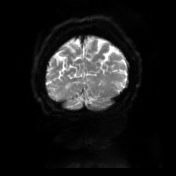
[im 26/64]
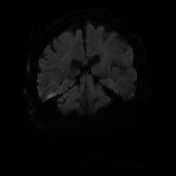
[im 38/64]
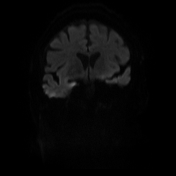
[im 51/64]
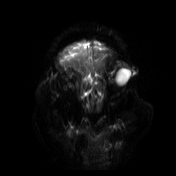
[im 64/64]
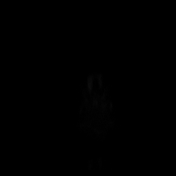

[Series 16: DWI · coronal · 5.0mm · 1.31mm/px · 3 of 32 slices shown (2 of 2)]
[im 1/32]
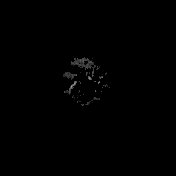
[im 16/32]
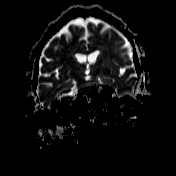
[im 32/32]
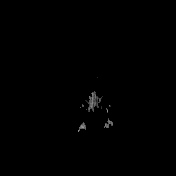

[Series 17: dwi_tracew · axial · 3.0mm · 1.08mm/px · z∈[-27,+102]mm · 7 of 106 slices shown]
[im 1/106]
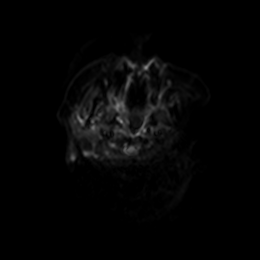
[im 12/106]
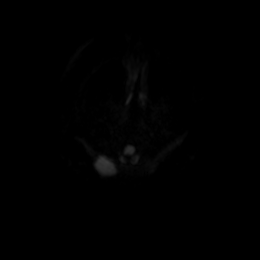
[im 36/106]
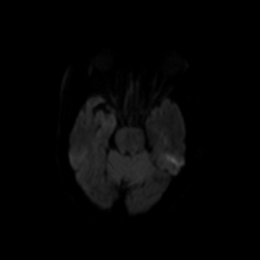
[im 47/106]
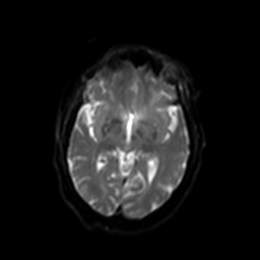
[im 59/106]
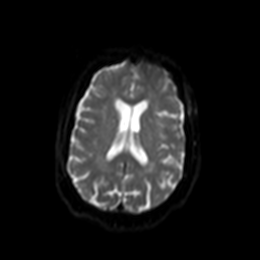
[im 71/106]
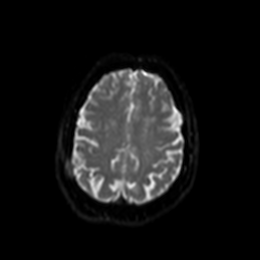
[im 94/106]
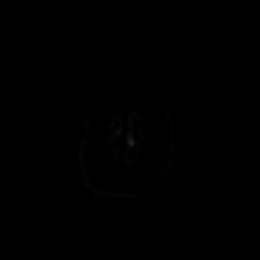

[Series 19: FLAIR · axial · 3.0mm · 0.86mm/px · z∈[-48,+100]mm · 5 of 53 slices shown]
[im 1/53]
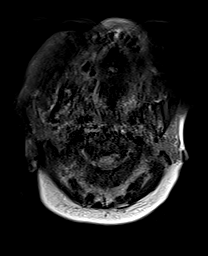
[im 14/53]
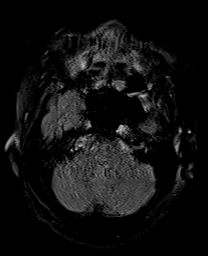
[im 27/53]
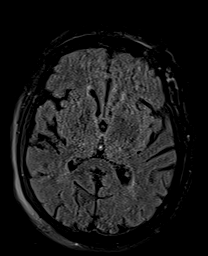
[im 40/53]
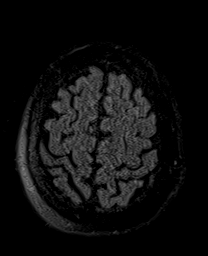
[im 53/53]
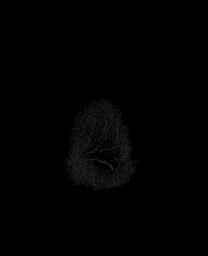

[40 of 48 positions shown; findings below may reference images not displayed]

FINDINGS: Multiple sequences are moderately motion degraded.

Brain: There is a subcentimeter acute infarct in the posterior left
frontal white matter at the level of the centrum semiovale. No
intracranial hemorrhage, mass, midline shift, or extra-axial fluid
collection is identified. T2 hyperintensities in the cerebral white
matter and pons are nonspecific but compatible with mild chronic
small vessel ischemic disease. The ventricles and sulci are within
normal limits for age.

Vascular: Major intracranial vascular flow voids are preserved.

Skull and upper cervical spine: Unremarkable bone marrow signal.

Sinuses/Orbits: Bilateral cataract extraction. Bilateral proptosis.
Paranasal sinuses and mastoid air cells are clear.

Other: None.
IMPRESSION: 1. Small acute left frontal white matter infarct.
2. Mild chronic small vessel ischemic disease.

## 2020-10-23 IMAGING — CT CT HEAD W/O CM
4 of 8 series · 15 of 47 positions shown, 16 images · non-contrast
Comparison: None.

CLINICAL DATA: 87-year-old female with unexplained altered mental
status, confusion.

EXAM:
CT HEAD WITHOUT CONTRAST
TECHNIQUE: Contiguous axial images were obtained from the base of the skull
through the vertex without intravenous contrast.

[Series 2: head wo · axial · 0.47mm/px · z∈[-124,-39]mm · 4 of 29 slices shown, 5 images]
[im 6/29  brain]
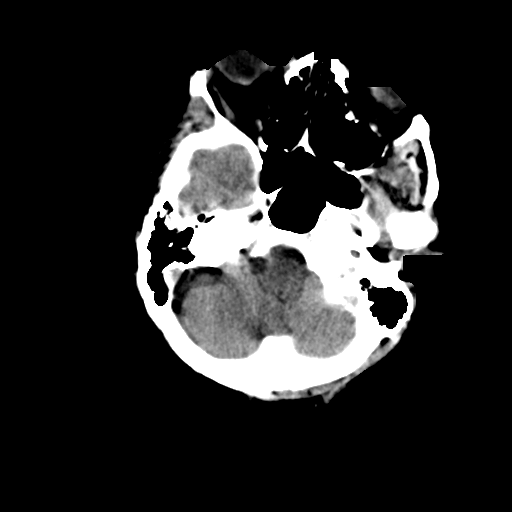
[im 6/29  bone]
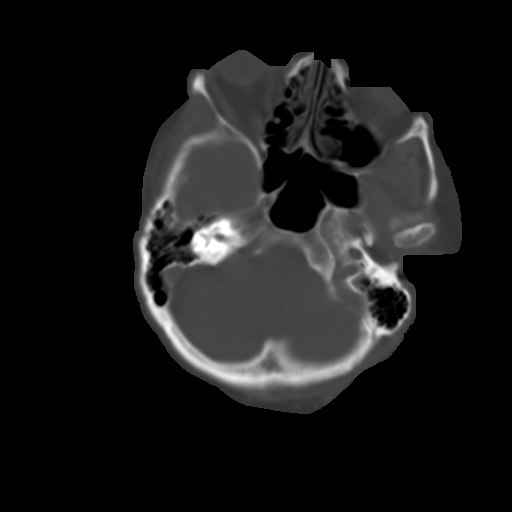
[im 12/29  brain]
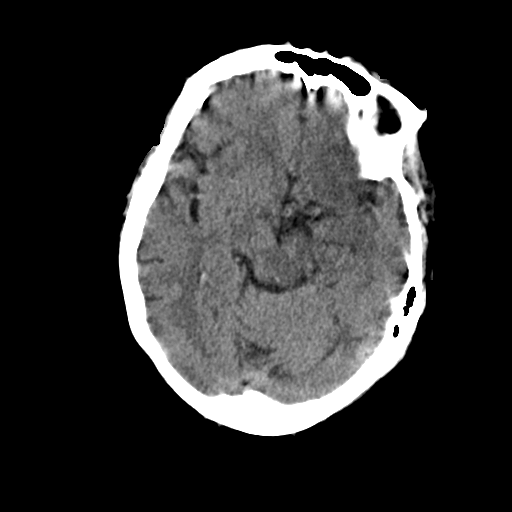
[im 17/29  brain]
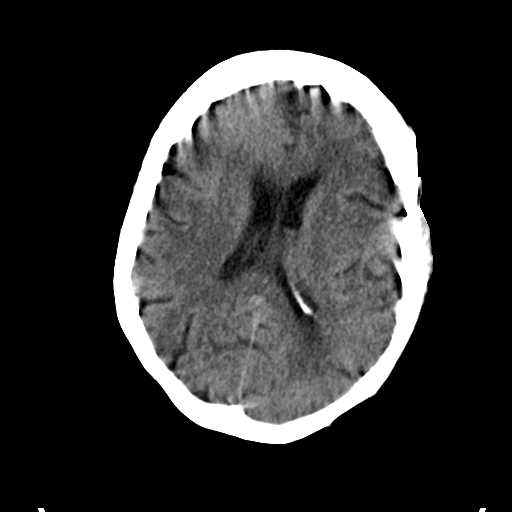
[im 23/29  brain]
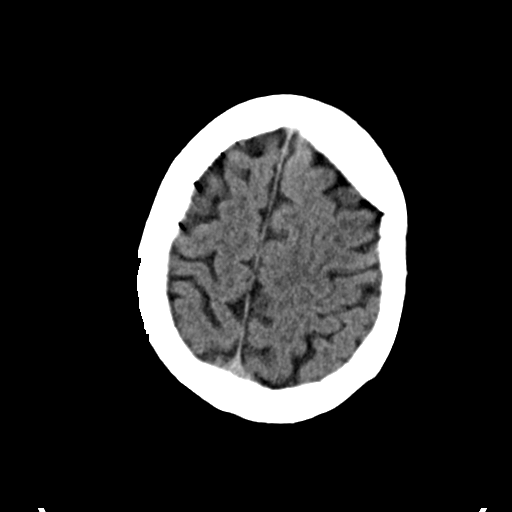

[Series 3: head bone · axial · 0.47mm/px · z∈[-139,-57]mm · 6 of 71 slices shown]
[im 6/71  bone]
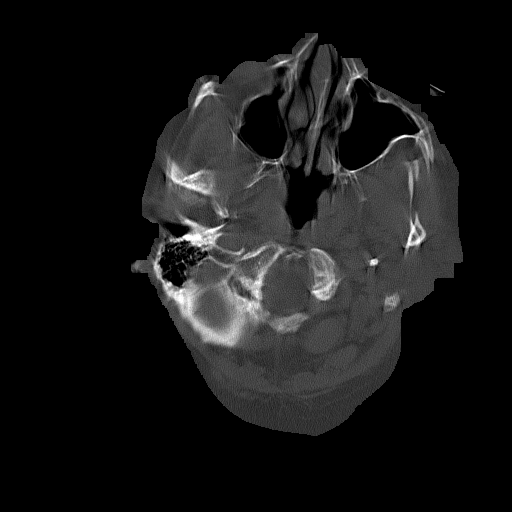
[im 18/71  bone]
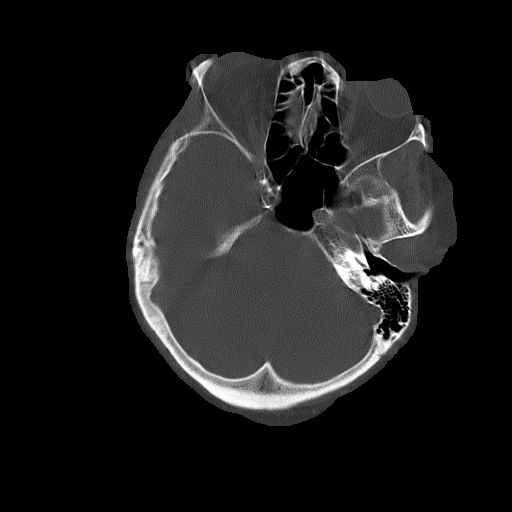
[im 24/71  bone]
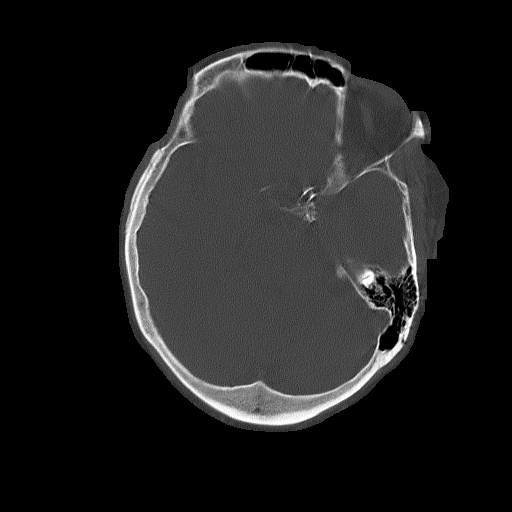
[im 30/71  bone]
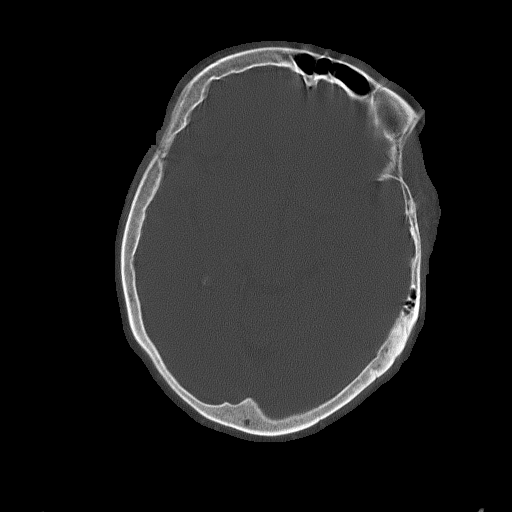
[im 41/71  bone]
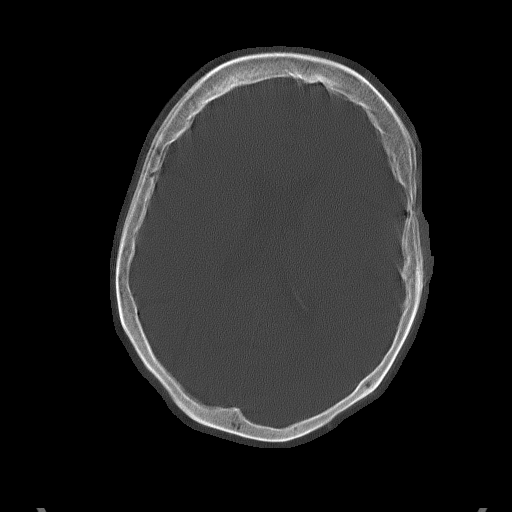
[im 47/71  bone]
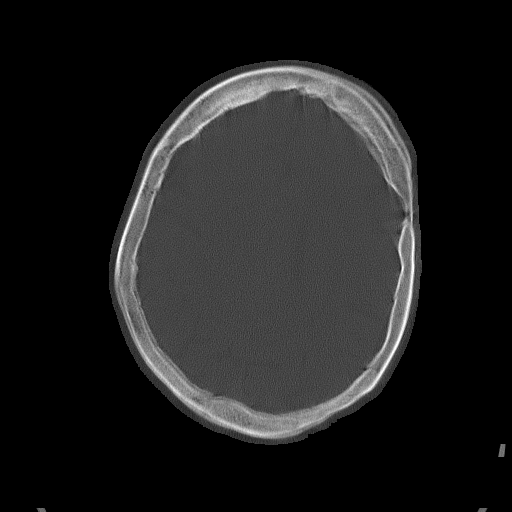

[Series 4: coronal soft tissue · coronal · 0.31mm/px · 3 of 71 slices shown]
[im 18/71  brain]
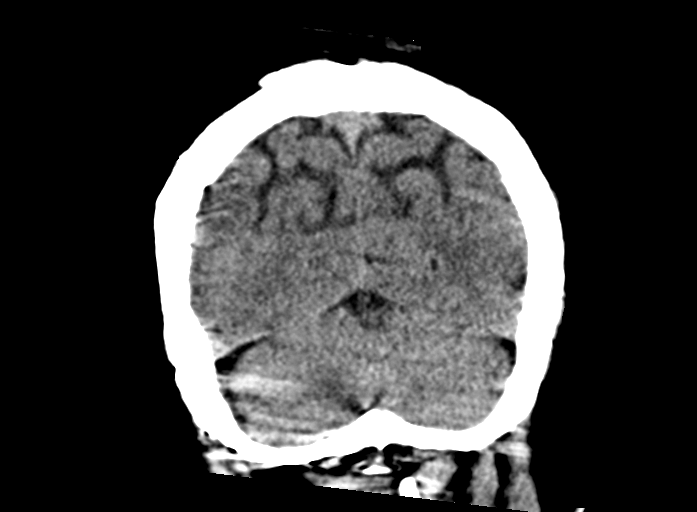
[im 36/71  brain]
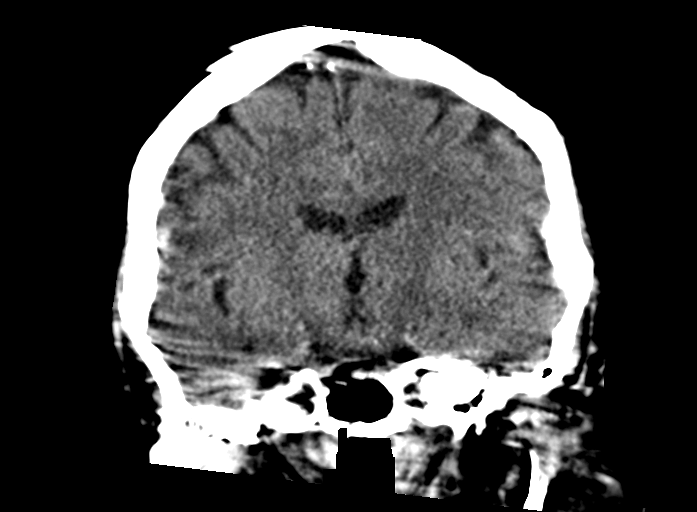
[im 53/71  brain]
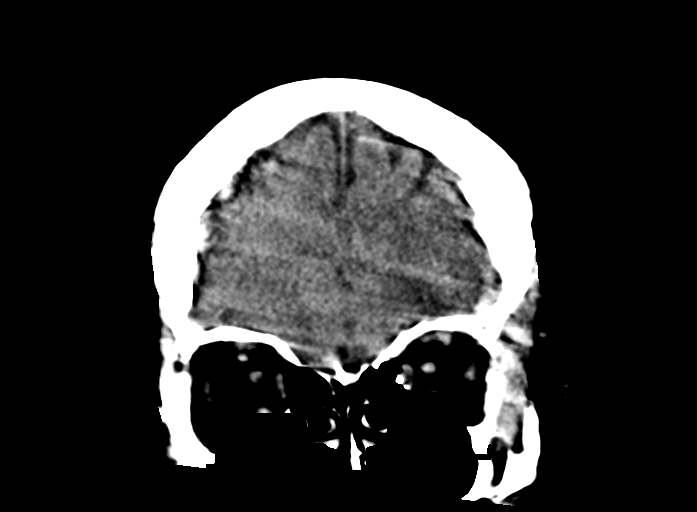

[Series 9: sagittal soft tissue · sagittal · 0.18mm/px · 2 of 59 slices shown]
[im 20/59  brain]
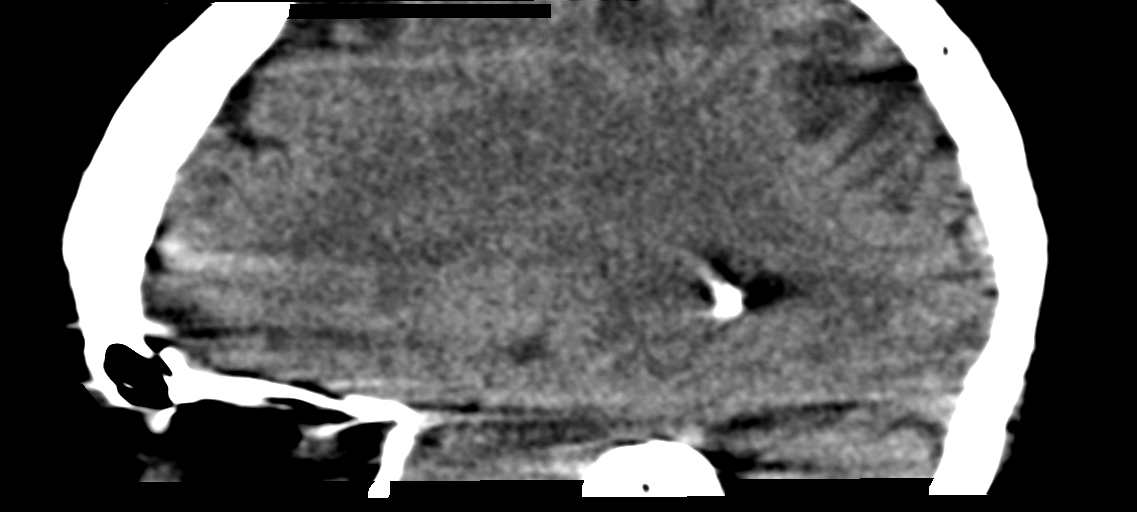
[im 39/59  brain]
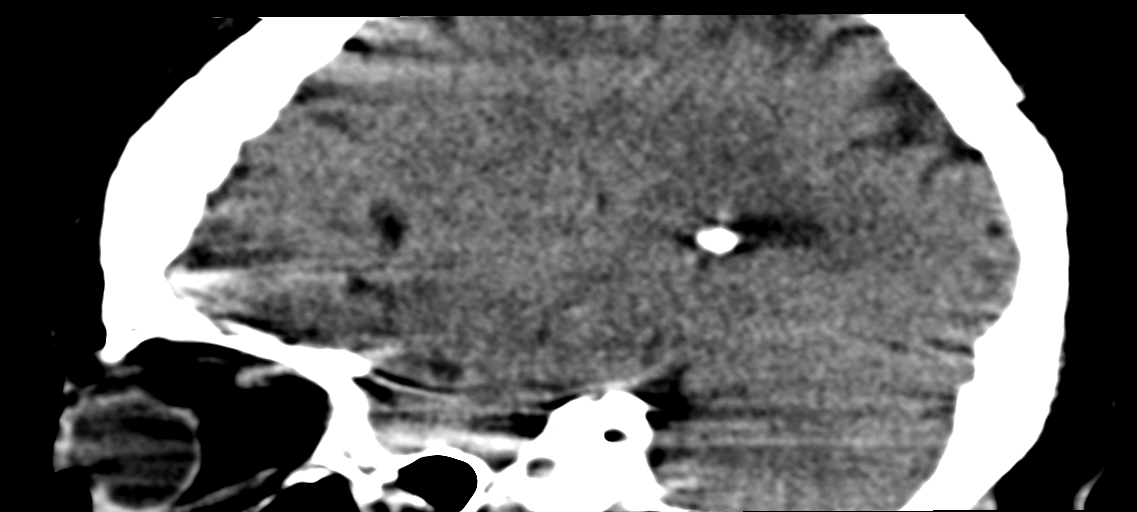

[15 of 47 positions shown; findings below may reference images not displayed]

FINDINGS: Study is intermittently degraded by motion artifact despite repeated
imaging attempts.

Brain: Cerebral volume is within normal limits for age. No
ventriculomegaly. No midline shift, mass effect, or evidence of
intracranial mass lesion. No acute intracranial hemorrhage
identified. No cortically based acute infarct identified. No
definite cortical encephalomalacia. Allowing for intermittent motion
gray-white matter differentiation appears fairly normal for age
throughout the brain.

Vascular: Calcified atherosclerosis at the skull base. No suspicious
intracranial vascular hyperdensity.

Skull: Intermittent motion. No acute osseous abnormality identified.

Sinuses/Orbits: Visualized paranasal sinuses and mastoids are clear.

Other: No acute orbit or scalp soft tissue finding.
IMPRESSION: Negative for age noncontrast CT appearance of the brain when
allowing for motion degradation despite repeated imaging attempts.

## 2020-10-23 MED ORDER — FENTANYL CITRATE (PF) 100 MCG/2ML IJ SOLN
25.0000 ug | Freq: Once | INTRAMUSCULAR | Status: AC
  Administered 2020-10-23: 25 ug via INTRAVENOUS
  Filled 2020-10-23: qty 2

## 2020-10-23 MED ORDER — PANTOPRAZOLE SODIUM 40 MG IV SOLR
40.0000 mg | Freq: Every day | INTRAVENOUS | Status: DC
  Administered 2020-10-24: 40 mg via INTRAVENOUS
  Filled 2020-10-23: qty 40

## 2020-10-23 MED ORDER — DOCUSATE SODIUM 50 MG/5ML PO LIQD
100.0000 mg | Freq: Two times a day (BID) | ORAL | Status: DC
  Filled 2020-10-23 (×3): qty 10

## 2020-10-23 MED ORDER — FUROSEMIDE 10 MG/ML IJ SOLN: 20.0000 mg | Freq: Every day | INTRAMUSCULAR | Status: DC

## 2020-10-23 MED ORDER — CHLORHEXIDINE GLUCONATE CLOTH 2 % EX PADS
6.0000 | MEDICATED_PAD | Freq: Every day | CUTANEOUS | Status: DC
  Administered 2020-10-24: 6 via TOPICAL

## 2020-10-23 MED ORDER — CHLORHEXIDINE GLUCONATE 0.12% ORAL RINSE (MEDLINE KIT)
15.0000 mL | Freq: Two times a day (BID) | OROMUCOSAL | Status: DC
  Administered 2020-10-23 – 2020-10-25 (×5): 15 mL via OROMUCOSAL

## 2020-10-23 MED ORDER — FENTANYL BOLUS VIA INFUSION
25.0000 ug | INTRAVENOUS | Status: DC | PRN
  Administered 2020-10-23 – 2020-10-24 (×6): 25 ug via INTRAVENOUS
  Filled 2020-10-23: qty 25

## 2020-10-23 MED ORDER — ALBUTEROL SULFATE (2.5 MG/3ML) 0.083% IN NEBU
2.5000 mg | INHALATION_SOLUTION | RESPIRATORY_TRACT | Status: DC
  Administered 2020-10-24 (×3): 2.5 mg via RESPIRATORY_TRACT
  Filled 2020-10-23 (×3): qty 3

## 2020-10-23 MED ORDER — ASPIRIN 325 MG PO TABS
325.0000 mg | ORAL_TABLET | Freq: Once | ORAL | Status: AC
  Administered 2020-10-23: 325 mg via ORAL
  Filled 2020-10-23: qty 1

## 2020-10-23 MED ORDER — LORAZEPAM 2 MG/ML IJ SOLN
0.5000 mg | Freq: Once | INTRAMUSCULAR | Status: AC
  Administered 2020-10-23: 0.5 mg via INTRAVENOUS
  Filled 2020-10-23: qty 1

## 2020-10-23 MED ORDER — ORAL CARE MOUTH RINSE
15.0000 mL | OROMUCOSAL | Status: DC
  Administered 2020-10-24 (×6): 15 mL via OROMUCOSAL

## 2020-10-23 MED ORDER — ASPIRIN EC 81 MG PO TBEC
81.0000 mg | DELAYED_RELEASE_TABLET | Freq: Every day | ORAL | Status: DC
  Administered 2020-10-24 – 2020-10-26 (×3): 81 mg via ORAL
  Filled 2020-10-23 (×3): qty 1

## 2020-10-23 MED ORDER — PROPOFOL 1000 MG/100ML IV EMUL
5.0000 ug/kg/min | INTRAVENOUS | Status: DC
  Filled 2020-10-23: qty 100

## 2020-10-23 MED ORDER — LORAZEPAM 2 MG/ML IJ SOLN
1.0000 mg | Freq: Once | INTRAMUSCULAR | Status: AC
  Administered 2020-10-23: 1 mg via INTRAVENOUS
  Filled 2020-10-23: qty 1

## 2020-10-23 MED ORDER — DEXMEDETOMIDINE HCL IN NACL 400 MCG/100ML IV SOLN
0.0000 ug/kg/h | INTRAVENOUS | Status: DC
  Administered 2020-10-23 – 2020-10-24 (×2): 0.4 ug/kg/h via INTRAVENOUS
  Administered 2020-10-24: 0.8 ug/kg/h via INTRAVENOUS
  Filled 2020-10-23 (×3): qty 100

## 2020-10-23 MED ORDER — INSULIN ASPART 100 UNIT/ML ~~LOC~~ SOLN
0.0000 [IU] | SUBCUTANEOUS | Status: DC
  Administered 2020-10-24 (×2): 1 [IU] via SUBCUTANEOUS
  Administered 2020-10-25: 2 [IU] via SUBCUTANEOUS
  Administered 2020-10-25 – 2020-10-26 (×2): 1 [IU] via SUBCUTANEOUS

## 2020-10-23 MED ORDER — FENTANYL 2500MCG IN NS 250ML (10MCG/ML) PREMIX INFUSION
25.0000 ug/h | INTRAVENOUS | Status: DC
  Administered 2020-10-23: 25 ug/h via INTRAVENOUS
  Filled 2020-10-23: qty 250

## 2020-10-23 NOTE — Consult Note (Incomplete)
NAME:  [name] MRN:  [id number] DOB:  07/02/1933 LOS: 2 ADMISSION DATE:  10/21/2020  CONSULTATION DATE:  10/23/2020 REFERRING MD:  Dr. Estill Cotta  REASON FOR CONSULTATION:  Acute hypoxemic respiratory failure   Initial Pulmonary/Critical Care Consultation  Brief History   N/A  History of present illness   This 87 y.o. African American female transferred to [facility] via EMS from [place].  As EMS was transferring the patient, the patient was noted to become rapidly progressively hypoxic and hypertensive.  They diverted to the [facility] Emergency Department, where she underwent intubation and was placed on mechanical ventilatory support.  She was noted to have copious pink, frothy sputum emanating from the ET tube after intubation.  Initially, she was being transferred for neurologic evaluation of possible seizure activity and further evaluation and management of a small acute left frontal infarct, an incidental finding during her evaluation for confusion.  The patient was originally admitted on 10/21/2020 with "COPD and foot swelling".  She reported that she had recently been taken off her diuretic (HCTZ) after complaining about nocturia.  She was admitted to [facility] with a working diagnosis of acute hypoxemic respiratory failure, suspected to be secondary to new onset CHF.  REVIEW OF SYSTEMS This patient is critically ill and cannot provide additional history nor review of systems due to mental status/unconsciousness and endotracheally intubated.  Constitutional: No weight loss. No night sweats. No fever. No chills. No fatigue. HEENT: No headaches, dysphagia, sore throat, otalgia, nasal congestion, PND CV:  No chest pain, orthopnea, PND, swelling in lower extremities, palpitations GI:  No abdominal pain, nausea, vomiting, diarrhea, change in bowel pattern, anorexia Resp: No DOE, rest dyspnea, cough, mucus, hemoptysis, wheezing  GU:  no dysuria, change in color of urine, no urgency or frequency.  No flank pain. MS:  No joint pain or swelling. No myalgias,  No decreased range of motion.  Psych:  No change in mood or affect. No memory loss. Skin: no rash or lesions.   Past Medical/Surgical/Social/Family History   Past Medical History:  Diagnosis Date  . Colon polyp   . COPD (chronic obstructive pulmonary disease) (HCC)    MILD  . Hyperlipidemia   . Hypertension   . OA (osteoarthritis)    OF THE KNEES  . Osteopenia   . Shingles   . Thyroid cyst     Past Surgical History:  Procedure Laterality Date  . BREAST EXCISIONAL BIOPSY Right    benign    Social History   Tobacco Use  . Smoking status: Former Research scientist (life sciences)  . Smokeless tobacco: Never Used  Substance Use Topics  . Alcohol use: Never    Family History  Problem Relation Age of Onset  . CAD Father   . Breast cancer Neg Hx      Significant Hospital Events   3/11: admitted to [facility] 3/13: intubated during transfer to [place]:  Neurology PCCM   Procedures:  3/13: intubated by EMS (in [facility] ER).   Significant Diagnostic Tests:  MRI (3/13)   Micro Data:   Results for orders placed or performed during the hospital encounter of 10/21/20  Resp Panel by RT-PCR (Flu A&B, Covid) Nasopharyngeal Swab     Status: None   Collection Time: 10/21/20  9:44 AM   Specimen: Nasopharyngeal Swab; Nasopharyngeal(NP) swabs in vial transport medium  Result Value Ref Range Status   SARS Coronavirus 2 by RT PCR NEGATIVE  NEGATIVE Final    Comment: (NOTE) SARS-CoV-2 target nucleic acids are NOT DETECTED.  The SARS-CoV-2 RNA is generally detectable in upper respiratory specimens during the acute phase of infection. The lowest concentration of SARS-CoV-2 viral copies this assay can detect is 138 copies/mL. A negative result does not preclude SARS-Cov-2 infection and should not be used as the sole basis for treatment or other patient management  decisions. A negative result may occur with  improper specimen collection/handling, submission of specimen other than nasopharyngeal swab, presence of viral mutation(s) within the areas targeted by this assay, and inadequate number of viral copies(<138 copies/mL). A negative result must be combined with clinical observations, patient history, and epidemiological information. The expected result is Negative.  Fact Sheet for Patients:  EntrepreneurPulse.com.au  Fact Sheet for Healthcare Providers:  IncredibleEmployment.be  This test is no t yet approved or cleared by the [place] FDA and  has been authorized for detection and/or diagnosis of SARS-CoV-2 by FDA under an Emergency Use Authorization (EUA). This EUA will remain  in effect (meaning this test can be used) for the duration of the COVID-19 declaration under Section 564(b)(1) of the Act, 21 U.S.C.section 360bbb-3(b)(1), unless the authorization is terminated  or revoked sooner.       Influenza A by PCR NEGATIVE NEGATIVE Final   Influenza B by PCR NEGATIVE NEGATIVE Final    Comment: (NOTE) The Xpert Xpress SARS-CoV-2/FLU/RSV plus assay is intended as an aid in the diagnosis of influenza from Nasopharyngeal swab specimens and should not be used as a sole basis for treatment. Nasal washings and aspirates are unacceptable for Xpert Xpress SARS-CoV-2/FLU/RSV testing.  Fact Sheet for Patients: EntrepreneurPulse.com.au  Fact Sheet for Healthcare Providers: IncredibleEmployment.be  This test is not yet approved or cleared by the [place] FDA and has been authorized for detection and/or diagnosis of SARS-CoV-2 by FDA under an Emergency Use Authorization (EUA). This EUA will remain in effect (meaning this test can be used) for the duration of the COVID-19 declaration under Section 564(b)(1) of the Act, 21 U.S.C. section 360bbb-3(b)(1), unless the  authorization is terminated or revoked.  Performed at [facility], [place] [address], [place], [place] [address]       Antimicrobials:    Interim history/subjective:    Objective   BP (!) 141/73   Pulse 81   Temp 98.2 F (36.8 C)   Resp 20   Ht 4' 11.75" (1.518 m)   Wt 96.6 kg   SpO2 (!) 87%   BMI 41.95 kg/m     Filed Weights   10/21/20 0832 10/23/20 0404  Weight: 94.3 kg 96.6 kg    Intake/Output Summary (Last 24 hours) at 10/23/2020 2247 Last data filed at 10/23/2020 1832 Gross per 24 hour  Intake 1040 ml  Output 1400 ml  Net -360 ml      Examination: GENERAL: Intubated. Sedated.  Morbidly obese.  Withdraws to pain.   HEAD: normocephalic, atraumatic EYE: PERRLA, EOM intact, no scleral icterus, no pallor. NOSE: nares are patent. No polyps. No exudate.  THROAT/ORAL CAVITY: Mucous membranes are moist. No tonsillar enlargement. ETT in situ.  NECK: supple, no thyromegaly, no JVD, no lymphadenopathy. Trachea midline. CHEST/LUNG: Diminished air entry bilaterally, symmetric.  Coarse crackles.  Scattered rhonchi.   HEART: Tachycardic.  Regular S1 and S2 without murmur, rub or gallop. ABDOMEN: Huge pannus.  Pendulous abdomen.  Soft, nontender, nondistended. Normoactive bowel sounds. No rebound. No guarding. No hepatosplenomegaly. EXTREMITIES: Edema: 2+ and pitting. No cyanosis. No  clubbing. 2+ DP pulses LYMPHATIC: no cervical/axillary/inguinal lymph nodes appreciated MUSCULOSKELETAL: No point tenderness. No bulk atrophy. Joints: normal inspection.  SKIN:  No rash or lesion. NEUROLOGIC: Doll's eyes intact. Corneal reflex intact. Synchronous with ventilator.  Cough/gag intact. Facial symmetry. Babinski absent. No sensory deficit. No tremors.  Resolved Hospital Problem list      Assessment & Plan:   ASSESSMENT/PLAN:  ASSESSMENT (included in the Hospital Problem List)  Principal Problem:   Acute respiratory failure with hypoxia ([place]) Active  Problems:   Hypertensive urgency   Volume overload   Tremors of nervous system   Essential hypertension   Morbid obesity (HCC)   COPD (chronic obstructive pulmonary disease) (HCC)   Ischemic stroke of frontal lobe (HCC)   By systems: PULMONARY Acute hypoxemic and hypercapnic respiratory failure Acute pulmonary edema Volume overload COPD  Titrate vent settings based on ABG results.  Avoid sedation if possible.  Wean Precedex as tolerated.  Target RASS 0.  Diurese.   CARDIOVASCULAR Hypertensive crisis  Blood pressure control as needed.  Home med: Toprol Xl 75 mg PO daily.   RENAL:  Metabolic alkalosis, likely due to aggressive diuretic therapy  Monitor urine output   GASTROINTESTINAL: No acute issues  GI PROPHYLAXIS: Protonix   HEMATOLOGIC  DVT PROPHYLAXIS: Lovenox   INFECTIOUS: No acute issues   ENDOCRINE: No acute issues   NEUROLOGIC:  Encephalopathy, perhaps hypertensive or metabolic (hypercapnic/hypoxic) Altered mental status Rhythmic movements: tremors/asterixis vs seizure  Case discussed with Dr. Cheral Marker.  Defer to Neuro re: timing of EEG.  Aspirin administered  Further evaluation per Neuro/stroke team.   PLAN/RECOMMENDATIONS   Transfer to ICU for further evaluation and management of acute hypoxemic and hypercapnic respiratory failure.  See above.    My assessment, plan of care, findings, medications, side effects, etc. were discussed with: nurse, Dr. Cheral Marker (Neurology)  Patient contact listed as sister Dillon Bjork [phone]) has an incorrect phone number listed.   Best practice:  Diet: NPO Pain/Anxiety/Delirium protocol (if indicated): Precedex VAP protocol (if indicated): YES DVT prophylaxis: Lovenox GI prophylaxis: Protonix Glucose control: N/A Mobility/Activity: Bedrest CODE STATUS: LIMITED CODE, but as the patient is intubated at this time, she is effectively back to FULL CODE status. Family Communication:   Patient  contact listed as sister Dillon Bjork [phone]) has an incorrect phone number listed. Disposition: keep in ICU   Labs   CBC: Recent Labs  Lab 10/21/20 0944 10/22/20 0436 10/23/20 0435  WBC 7.9 7.8 9.2  HGB 13.8 13.8 14.0  HCT 45.3 47.9* 47.4*  MCV 99.6 102.4* 102.8*  PLT 153 163 585    Basic Metabolic Panel: Recent Labs  Lab 10/21/20 0944 10/22/20 0436 10/22/20 2310 10/23/20 0435  NA 140 142 140 139  K 4.7 4.9 5.0 4.8  CL 99 97* 94* 92*  CO2 33* 34* 33* 36*  GLUCOSE 105* 136* 136* 132*  BUN 17 25* 27* 29*  CREATININE 0.98 1.04* 1.10* 1.02*  CALCIUM 9.0 8.9 8.8* 8.9  MG  --   --  2.3  --    GFR: Estimated Creatinine Clearance: 41.8 mL/min (A) (by C-G formula based on SCr of 1.02 mg/dL (H)). Recent Labs  Lab 10/21/20 0944 10/22/20 0436 10/23/20 0435  WBC 7.9 7.8 9.2    Liver Function Tests: Recent Labs  Lab 10/22/20 2310  AST 21  ALT 26  ALKPHOS 52  BILITOT 0.6  PROT 6.8  ALBUMIN 3.6   No results for input(s): LIPASE, AMYLASE in the last 168 hours. Recent  Labs  Lab 10/23/20 1143  AMMONIA 26    ABG No results found for: PHART, PCO2ART, PO2ART, HCO3, TCO2, ACIDBASEDEF, O2SAT   Coagulation Profile: No results for input(s): INR, PROTIME in the last 168 hours.  Cardiac Enzymes: No results for input(s): CKTOTAL, CKMB, CKMBINDEX, TROPONINI in the last 168 hours.  HbA1C: No results found for: HGBA1C  CBG: Recent Labs  Lab 10/22/20 0356 10/22/20 2127 10/23/20 1039  GLUCAP 131* 136* 139*     Review of Systems:   See above   Past Medical History   Past Medical History:  Diagnosis Date  . Colon polyp   . COPD (chronic obstructive pulmonary disease) (HCC)    MILD  . Hyperlipidemia   . Hypertension   . OA (osteoarthritis)    OF THE KNEES  . Osteopenia   . Shingles   . Thyroid cyst       Surgical History    Past Surgical History:  Procedure Laterality Date  . BREAST EXCISIONAL BIOPSY Right    benign       Social History   Social History   Socioeconomic History  . Marital status: Widowed    Spouse name: Not on file  . Number of children: Not on file  . Years of education: Not on file  . Highest education level: Not on file  Occupational History  . Not on file  Tobacco Use  . Smoking status: Former Research scientist (life sciences)  . Smokeless tobacco: Never Used  Vaping Use  . Vaping Use: Never used  Substance and Sexual Activity  . Alcohol use: Never  . Drug use: Never  . Sexual activity: Not on file  Other Topics Concern  . Not on file  Social History Narrative  . Not on file   Social Determinants of Health   Financial Resource Strain: Not on file  Food Insecurity: Not on file  Transportation Needs: Not on file  Physical Activity: Not on file  Stress: Not on file  Social Connections: Not on file      Family History    Family History  Problem Relation Age of Onset  . CAD Father   . Breast cancer Neg Hx    family history includes CAD in her father. There is no history of Breast cancer.    Allergies Allergies  Allergen Reactions  . Ceftin [Cefuroxime] Diarrhea  . Clindamycin/Lincomycin Itching  . Simvastatin     Muscle pain with large dose  . Doxycycline Hyclate Diarrhea and Rash  . Penicillins Rash      Current Medications  Current Facility-Administered Medications:  .  acetaminophen (TYLENOL) tablet 650 mg, 650 mg, Oral, Q4H PRN, Rai, Ripudeep K, MD .  Derrill Memo ON 10/24/2020] albuterol (PROVENTIL) (2.5 MG/3ML) 0.083% nebulizer solution 2.5 mg, 2.5 mg, Nebulization, Q4H, Simpson, Paula B, NP .  [COMPLETED] aspirin tablet 325 mg, 325 mg, Oral, Once, 325 mg at 10/23/20 1600 **FOLLOWED BY** [START ON 10/24/2020] aspirin EC tablet 81 mg, 81 mg, Oral, Daily, Bhagat, Srishti L, MD .  chlorhexidine gluconate (MEDLINE KIT) (PERIDEX) 0.12 % solution 15 mL, 15 mL, Mouth Rinse, BID, Simpson, Paula B, NP .  dexmedetomidine (PRECEDEX) 400 MCG/100ML (4 mcg/mL) infusion, 0-1.2 mcg/kg/hr,  Intravenous, Continuous, Simpson, Paula B, NP .  docusate (COLACE) 50 MG/5ML liquid 100 mg, 100 mg, Per Tube, BID, Jennelle Human B, NP .  enoxaparin (LOVENOX) injection 40 mg, 40 mg, Subcutaneous, Q24H, Harold Hedge, MD, 40 mg at 10/23/20 2039 .  fentaNYL (SUBLIMAZE) bolus via infusion 25  mcg, 25 mcg, Intravenous, Q15 min PRN, Jennelle Human B, NP .  fentaNYL (SUBLIMAZE) injection 25 mcg, 25 mcg, Intravenous, Once, Jennelle Human B, NP .  fentaNYL 2548mg in NS 2548m(1041mml) infusion-PREMIX, 25-200 mcg/hr, Intravenous, Continuous, SimJennelle Human NP .  [STDerrill Memo 10/24/2020] insulin aspart (novoLOG) injection 0-9 Units, 0-9 Units, Subcutaneous, Q4H, SimJennelle Human NP .  [STDerrill Memo 10/24/2020] MEDLINE mouth rinse, 15 mL, Mouth Rinse, 10 times per day, SimJennelle Human NP .  metoprolol succinate (TOPROL-XL) 24 hr tablet 75 mg, 75 mg, Oral, Daily, SegHarold HedgeD, 75 mg at 10/23/20 1000 .  [START ON 10/24/2020] pantoprazole (PROTONIX) injection 40 mg, 40 mg, Intravenous, Daily, Simpson, Paula B, NP .  polyethylene glycol (MIRALAX / GLYCOLAX) packet 17 g, 17 g, Oral, Daily PRN, SegHarold HedgeD .  simvastatin (ZOCOR) tablet 5 mg, 5 mg, Oral, Daily, SegHarold HedgeD, 5 mg at 10/23/20 1000 .  sodium chloride flush (NS) 0.9 % injection 3 mL, 3 mL, Intravenous, Q12H, SegHarold HedgeD, 3 mL at 10/23/20 2039  Home Medications  Prior to Admission medications   Medication Sig Start Date End Date Taking? Authorizing Provider  calcium carbonate (OSCAL) 1500 (600 Ca) MG TABS tablet Take 600 mg of elemental calcium by mouth 2 (two) times daily with a meal.   Yes [provider]  celecoxib (CELEBREX) 100 MG capsule Take 100 mg by mouth 2 (two) times daily as needed for mild pain.   Yes [provider]  Cholecalciferol (VITAMIN D PO) Take 1,000 Units by mouth daily.   Yes [provider]  metoprolol succinate (TOPROL-XL) 50 MG 24 hr tablet Take 75 mg by mouth daily.  Taking 1 &1/2 tablet = 65m48mily   Yes [provider]  simvastatin (ZOCOR) 5 MG tablet Take 5 mg by mouth daily.    [provider]      Critical care time: 60 minutes.  The treatment and management of the patient's condition was required based on the threat of imminent deterioration. This time reflects time spent by the physician evaluating, providing care and managing the critically ill patient's care. The time was spent at the immediate bedside (or on the same floor/unit and dedicated to this patient's care). Time involved in separately billable procedures is NOT included int he critical care time indicated above. Family meeting and update time may be included above if and only if the patient is unable/incompetent to participate in clinical interview and/or decision making, and the discussion was necessary to determining treatment decisions.    SeonRenee Pain Board Certified by the ABIM, [place]

## 2020-10-23 NOTE — Progress Notes (Signed)
Rapid Response Event Note   Reason for Call : pt shaking and confused   Initial Focused Assessment: Arrived to find pt lying bed with head and head tremors.  Pt is A/O x4.  Pt able to f/c.  Denies tremors prior.  Pt denies pain. Pupils intact and smile, wnl.  Rhonchi breath sounds bil.  Grips strong and equal.  Pulses intact. Generalized edema, 2+ in BLE.    Interventions: VSS see flow sheet.  Jeannette Corpus, NP informed of pt tremors.  See chart for news orders (labs, neuro checks).  Otherwise, continue to monitor and report.    Plan of Care: Pt will remain in current location, primary RN please call if there seems to be more issues.   Event Summary:   MD Notified: yes Call Time: 2040 Arrival Time: 2044 End Time: 2140  Dyann Ruddle, RN

## 2020-10-23 NOTE — Significant Event (Signed)
Rapid Response Event Note   Reason for Call :  Tremors and AMS  Initial Focused Assessment:  Pt sitting in bed having conversation with bedside nurse. Pt with gross tremors to head and all extremities. Pt yelling "I am not confused" repeatedly.   Interventions:  Rapid response to bedside to assess patient. VSS. Pt does not appear confused, she is able to answer orientation questions correctly but appears to have difficulty finding the words to answer. When asking a patient a question she intermittently answers with the answer she provided for the previous question.  Nurse: "What is your name" Pt: " [name]" Nurse "What month is it " Pt: "[name]"...  "um".... "okay".... "Jeannetta- no"... "March".  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 NIH conducted with a result of 3 due to R leg weakness ad mild apahsia. Pts mood at the beginning of the interaction seemed defensive and by the end of the interaction seemed pleasant and happy. Tremors remain constant throughout interaction, but lessen in extremity she is utilizing at the time of utilization.   Plan of Care:  Pt remains in 1534. Rai, MD aware of patient situation and has already completed head CT prior to rapid being called. MD in process of working patient up for parkinson's. Neuro consulted by MD.  One time dose of 0.5mg  ativan ordered by Tana Coast, MD to help rule out withdrawal.  Event Summary:   MD Notified: Tana Coast, MD Call Time: [place] Time: [place] End Time: [place], RN

## 2020-10-23 NOTE — Plan of Care (Signed)

## 2020-10-23 NOTE — Progress Notes (Signed)
Inpatient Rehab Admissions Coordinator Note:   Per PT recommendation, pt was screened for CIR candidacy by Gayland Curry, MS, CCC-SLP.  At this time we are not recommending an inpatient rehab consult.  Will re-screen pending stroke work-up results. Will follow from a distance.  Please contact me with questions.    Gayland Curry, [place], [place] Admissions Coordinator [phone] 10/23/20 4:12 PM

## 2020-10-23 NOTE — Progress Notes (Signed)
Report given to receiving RN Baxter Flattery ) of [name] at 2000  At 2115 [facility] came to transport patient. Report given at bedside.

## 2020-10-23 NOTE — Consult Note (Addendum)
Neurology Consultation Reason for Consult: Altered consciousness and abnormal movements   Requesting Physician: Dr. Tana Coast  CC: Tremors and change in mentation   History is obtained from chart. History was documented by Dr. Curly Shores and reviewed by Dr. Cheral Marker. Additional history obtained by Dr. Cheral Marker regarding the events preceding emergent intubation and subsequent change of bed status to ICU during EMS xfer of patient from [facility] to [facility].   HPI: [name] is an 87 y.o. female with a history of COPD, hypertension, hyperlipidemia who presented to the ED for 1.5 weeks of shortness of breath and leg swelling. Due to increased urinary frequency at night, her PCP stopped her HCTZ about two weeks prior to presentatin. Since then she noticed increased ankle swelling and shortness of breath. Worse with exertion. Sleeps in a reclined bed but has not increased the angle recently. No history of CHF  Labs revealed elevated BUN/Cr of 29/1.02, normal Na, Ca and Mg, elevated BNP at 273, normal B12 and folate, normal WBC, elevated D-Dimer of 0.65 and normal TSH.  TTE yesterday was negative for mural thrombus.   CT head today. was grossly negative in the context of movement artifact.   MRI brain at 2:52 PM today revealed a subcentimeter acute left frontal lobe white matter ischemic infarction and mild chronic small vessel disease.     Staff and patient's family reported that she has had significant neurologic decline occurring over the past 24 hours. She has developed mild slurred speech as well as rhythmic movements of both arms, R>L, suggestive of asterixis. Additionally, she has frequent episodes of confusions and disorientation. Neurology was called regarding the above as well as for stroke evaluation.  ROS: Distant history of tobacco use. No chest pain, fever, chills or any other complaints at the time of assessment while she was still lucid. At the time of arrival to [facility] the patient is intubated on propofol  sedation and follow up ROS could not be obtained.       Past Medical History:  Diagnosis Date  . Colon polyp   . COPD (chronic obstructive pulmonary disease) (HCC)    MILD  . Hyperlipidemia   . Hypertension   . OA (osteoarthritis)    OF THE KNEES  . Osteopenia   . Shingles   . Thyroid cyst      Family History  Problem Relation Age of Onset  . CAD Father   . Breast cancer Neg Hx      Social History:  reports that she has quit smoking. She has never used smokeless tobacco. She reports that she does not drink alcohol and does not use drugs.    Exam: Current vital signs: BP (!) 153/68 (BP Location: Left Arm)   Pulse 82   Temp 98.3 F (36.8 C) (Oral)   Resp 16   Ht 4' 11.75" (1.518 m)   Wt 96.6 kg   SpO2 94%   BMI 41.95 kg/m  Vital signs in last 24 hours: Temp:  [98.3 F (36.8 C)-99 F (37.2 C)] 98.3 F (36.8 C) (03/13 0404) Pulse Rate:  [71-99] 82 (03/13 0404) Resp:  [16-17] 16 (03/13 0404) BP: (103-153)/(60-118) 153/68 (03/13 0404) SpO2:  [91 %-95 %] 94 % (03/13 0404) Weight:  [96.6 kg] 96.6 kg (03/13 0404)   Physical Exam  HEENT: [place]/AT Lungs: Intubated Ext: Warm and well-perfused  Neuro: Mental Status: Exam performed while on propofol sedation. Intubated. Eyes closed but will remain partially open after lids are elevated by examiner. Not responding  to any verbal stimuli. No attempts to communicate. Weak semipurposeful movements of BUE to pinch.  Cranial Nerves: II:  No blink to threat. Pupils equal, round and sluggishly reactive to light bilaterally 4 mm >> 3 mm.  III,IV, VI: No doll's eye reflex in the context of propofol sedation. Eyes are conjugate at the midline. No nystagmus.  V,VII: Face flaccidly symmetric. No blink to eyelash stimulation (sedated).  VIII: No response to auditory stimuli (sedated) IX,X: Intubated XI: Unable to assess XII: Intubated Motor: Weakly flexes BUE at elbows to pinch which appears most consistent with semipurposeful  movement rather than posturing.  Weak semipurposeful withdrawal movements of BLE to plantar stimulation.  No jerking or twitching noted.  Sensory: Movement to noxious as above.  Deep Tendon Reflexes:  2+ low amplitude bilateral patellae. Toes downgoing bilaterally Cerebellar/Gait: Unable to assess  I have reviewed labs in epic and the results pertinent to this consultation are:  Results for Linda, Prince (MRN [id number]) as of 10/23/2020 14:38  Ref. Range 10/23/2020 11:43  Ammonia Latest Ref Range: 9 - 35 umol/L 26  Results for Linda, Prince (MRN [id number]) as of 10/23/2020 14:38  Ref. Range 10/23/2020 09:06  Vitamin B12 Latest Ref Range: 180 - 914 pg/mL 571   Results for Linda, Prince (MRN [id number]) as of 10/23/2020 14:38  Ref. Range 10/23/2020 09:06  TSH Latest Ref Range: 0.350 - 4.500 uIU/mL 0.540  Results for Linda, Prince (MRN [id number]) as of 10/23/2020 14:38  Ref. Range 10/23/2020 09:06  Folate Latest Ref Range: >5.9 ng/mL 14.9  Results for Linda, Prince (MRN [id number]) as of 10/23/2020 14:38  Ref. Range 10/21/2020 12:29  Appearance Latest Ref Range: CLEAR  CLEAR  Bilirubin Urine Latest Ref Range: NEGATIVE  NEGATIVE  Color, Urine Latest Ref Range: YELLOW  STRAW (A)  Glucose, UA Latest Ref Range: NEGATIVE mg/dL NEGATIVE  Hgb urine dipstick Latest Ref Range: NEGATIVE  NEGATIVE  Ketones, ur Latest Ref Range: NEGATIVE mg/dL NEGATIVE  Leukocytes,Ua Latest Ref Range: NEGATIVE  NEGATIVE  Nitrite Latest Ref Range: NEGATIVE  NEGATIVE  pH Latest Ref Range: 5.0 - 8.0  5.0  Protein Latest Ref Range: NEGATIVE mg/dL NEGATIVE  Specific Gravity, Urine Latest Ref Range: 1.005 - 1.030  1.009   No results found for: HGBA1C   No results found for: CHOL, HDL, LDLCALC, LDLDIRECT, TRIG, CHOLHDL  No results found for: RPR   ECHO 10/22/2020:  1. Left ventricular ejection fraction, by estimation, is 65 to 70%. The  left ventricle has normal function. Left ventricular endocardial border  not  optimally defined to evaluate regional wall motion. There is mild  concentric left ventricular  hypertrophy. Left ventricular diastolic parameters are indeterminate.  2. Right ventricular systolic function is normal. The right ventricular  size is normal. Tricuspid regurgitation signal is inadequate for assessing  PA pressure.  3. The mitral valve is degenerative. No evidence of mitral valve  regurgitation. No evidence of mitral stenosis.  4. The aortic valve was not well visualized. Aortic valve regurgitation  is not visualized. No aortic stenosis is present.  5. The inferior vena cava is dilated in size with >50% respiratory  variability, suggesting right atrial pressure of 8 mmHg.  (Normal biatrial size)  I have reviewed the images obtained:   CT head 10/23/2020  Negative for age noncontrast CT appearance of the brain when allowing for motion degradation despite repeated imaging attempts.  Assessment/Recommendations:  87 y.o. female with a history of COPD, hypertension and hyperlipidemia who  presented to the [facility] ED for 1.5 weeks of shortness of breath and leg swelling. Labs revealed elevated BUN/Cr of 29/1.02, normal Na, Ca and Mg, elevated BNP at 273, normal B12 and folate, normal WBC, elevated D-Dimer of 0.65 and normal TSH. Staff and patient's family reported that she has had significant neurologic decline occurring over the past 24 hours. She has developed mild slurred speech as well as rhythmic movements of both arms, R>L, suggestive of asterixis. Additionally, she has frequent episodes of confusions and disorientation.TTE yesterday was negative for mural thrombus. MRI brain at 2:52 PM today revealed a small acute left frontal lobe white matter ischemic infarction and mild chronic small vessel disease.     AMS: - Most likely due to hypercarbic/hypoxemic etiology in the context of respiratory failure due to volume overload - Urinalysis and blood testing normal - She is s/p emergent  intubation at [facility], which occurred during initial stages of transport to [facility] due to acute respiratory distress.  - Management of respiratory failure per CCM  Rhythmic upper extremity movements suggestive of asterixis: - Consistent with a hypercarbic/hypoxemic encephalopathy - Seizure is also possible, but felt to be significantly less likely - No seizure activity seen on exam after arrival to [facility] - EEG has been ordered    Left cerebral hemisphere acute small vessel white matter stroke: - Stroke labs RPR, HgbA1c, fasting lipid panel - MRA head w/o and carotid dopplers - Frequent neuro checks - Echocardiogram completed 3/12 - NPO pending bedside swallow evaluation - Aspirin - dose 325mg  oral, followed by 81 mg daily - Consider Plavix 300 mg load with 75 mg daily for 21 - 90 day course if patient has no significant risk for bleeding - Risk factor modification - Telemetry monitoring; 30 day event monitor on discharge if no arrythmias captured  - Blood pressure goal: Normotension - PT consult, OT consult, Speech consult, unless patient is back to baseline - Stroke team to follow  Portions of the history and assessment/recommendations were documented by" Lesleigh Noe MD-PhD [facility] [phone]  50 minutes spent in the neurological evaluation and management of this critically ill patient.   Follow up history review as well as exam performed by Dr. Cheral Marker. Final assessment and recommendations amended/documented by Dr. Cheral Marker    Electronically signed: Dr. Kerney Elbe

## 2020-10-23 NOTE — ED Provider Notes (Signed)
Patient presented with [facility] during transport from [facility] to [facility].  [facility] crew report that the patient developed significant respiratory distress and hypoxia during transport from [place] bed.    Crew made decision to stop in the [place] long ED for intubation prior to transport to [facility].  [facility] crew asked for provider assistance with intubation.  Crew confirmed that patient had a partial code status but that intubation was acceptable.  Patient was in distress with respiratory distress and hypoxia.  RSI intubation completed without difficulty in the [facility] resuscitation bay.  INTUBATION Performed by: Valarie Merino  Required items: required blood products, implants, devices, and special equipment available Patient identity confirmed: provided demographic data and hospital-assigned identification number Time out: Immediately prior to procedure a "time out" was called to verify the correct patient, procedure, equipment, support staff and site/side marked as required.  Indications: Resp distress/hypoxia  Intubation method: Glidescope Laryngoscopy   Preoxygenation: 100% / BVM  Sedatives: 20Etomidate Paralytic: 100Succinylcholine  Tube Size: 7.5 cuffed  Post-procedure assessment: chest rise and ETCO2 monitor Breath sounds: equal and absent over the epigastrium Tube secured with: ETT holder Chest x-ray interpreted by radiologist and me.  Chest x-ray findings: endotracheal tube in appropriate position  Patient tolerated the procedure well with no immediate complications.    After assistance with intubation, care of patient transferred back to [facility].    Valarie Merino, MD 10/23/20 2206

## 2020-10-23 NOTE — Progress Notes (Signed)
This nurse noted that this patient was having tremors with difficulty expressing herself and her needs. The patient started to not be able to hold her medicine cup or water and needed assistance with taking meds and with feeding during meals. This nurse called rapid response due to increasing ferocity of the tremors. Rapid response RN evaluated patient. MD was made aware. CT scan and MRI was ordered per MD and completed.  Upon MD evaluation with consult from Neurology it was decided that this patient will be transferred to [facility] for further evaluation of symptoms. This nurse updated family through this process and updated family when at bedside. Report was given to on-coming nurse, paperwork for transfer was completed. On-coming nurse will call report once bed is available. Family is aware of transfer to [facility].

## 2020-10-23 NOTE — Evaluation (Addendum)
Physical Therapy Evaluation Patient Details Name: [name] MRN: [id number] DOB: 08/05/1933 Today's Date: 10/23/2020   History of Present Illness  87 yo female admitted with acute respiratory failure, possible CHF. Once admitted, noted to have confusion, disorientation, UE jerking movement. MRI (+) small acute L frontal infarct. Hx of obesity, COPD, urinary frequency, shingles.  Clinical Impression  On eval, pt required Mod assist for mobility. She was able to stand and pivot over to recliner using a RW. Jerking UE and LEs is causing pt to have LE buckling and impaired ability to maintain grip on walker handles. Pt is at high risk for falls when mobilizing. Unable to safely attempt ambulation for this reason. She is also exhibiting confusion, poor attention to task, difficulty with word finding, impaired memory, and restlessness. At baseline, pt lived alone and was ambulatory with use of a RW. Family was present during session. They confirm that pt's mobility and cognition are far from her baseline. At this time, recommendation is for CIR consult. Will follow and progress activity as safely able.     Follow Up Recommendations CIR    Equipment Recommendations   (continuing to assess)    Recommendations for Other Services Rehab consult;OT consult     Precautions / Restrictions Precautions Precautions: Fall Precaution Comments: jerking UE/LE. LEs intermittently want to give way Restrictions Weight Bearing Restrictions: No      Mobility  Bed Mobility Overal bed mobility: Needs Assistance Bed Mobility: Supine to Sit     Supine to sit: Min assist;HOB elevated     General bed mobility comments: Increased time and effort. Pt able to initiate task but tends to lose balance posteriorly. Once EOB with feet supported, Min guard assist.    Transfers Overall transfer level: Needs assistance Equipment used: Rolling walker (2 wheeled) Transfers: Sit to/from [employer] Sit to Stand: Mod assist Stand pivot transfers: Mod assist       General transfer comment: x 2. Assist to power up, stabilize, control descent. Able to step over to recliner with use of RW but high fall risk 2* buckling and jerking  Ambulation/Gait             General Gait Details: Nt-unable to safely attempt on today with +1 assist 2* fall risk  Stairs            Wheelchair Mobility    Modified Rankin (Stroke Patients Only)       Balance Overall balance assessment: Needs assistance         Standing balance support: Bilateral upper extremity supported Standing balance-Leahy Scale: Poor                               Pertinent Vitals/Pain Pain Assessment: Faces Faces Pain Scale: No hurt    Home Living Family/patient expects to be discharged to:: Unsure Living Arrangements: Alone   Type of Home: House       Home Layout: One level Home Equipment: Walker - 2 wheels      Prior Function Level of Independence: Independent with assistive device(s)               Hand Dominance        Extremity/Trunk Assessment   Upper Extremity Assessment Upper Extremity Assessment: RUE deficits/detail;LUE deficits/detail RUE Deficits / Details: able to grip therapist's fingers well when cued but unable to maintain; grip repeatedly lost from walker handles 2* jerking RUE Coordination: decreased  gross motor;decreased fine motor LUE Deficits / Details: able to grip therapist's fingers well when cued but unable to maintain; grip repeatedly lost from walker handles 2* jerking LUE Coordination: decreased gross motor;decreased fine motor    Lower Extremity Assessment Lower Extremity Assessment: RLE deficits/detail;LLE deficits/detail RLE Deficits / Details: able to weightbear but "spontaneous jerking" causes buckling RLE Coordination: decreased fine motor;decreased gross motor LLE Deficits / Details: able to weightbear but "spontaneous  jerking" causes buckling LLE Coordination: decreased fine motor;decreased gross motor    Cervical / Trunk Assessment Cervical / Trunk Assessment: Normal  Communication   Communication: Expressive difficulties  Cognition Arousal/Alertness: Awake/alert Behavior During Therapy: Restless Overall Cognitive Status: Impaired/Different from baseline Area of Impairment: Orientation;Attention;Memory;Safety/judgement;Problem solving                 Orientation Level: Disoriented to;Time;Situation Current Attention Level: Sustained Memory: Decreased recall of precautions;Decreased short-term memory   Safety/Judgement: Decreased awareness of safety;Decreased awareness of deficits   Problem Solving: Requires verbal cues;Requires tactile cues General Comments: easily distracted (internally and externally).      General Comments      Exercises     Assessment/Plan    PT Assessment Patient needs continued PT services  PT Problem List Decreased mobility;Decreased balance;Decreased cognition;Decreased coordination;Decreased safety awareness;Decreased knowledge of precautions;Decreased knowledge of use of DME       PT Treatment Interventions DME instruction;Gait training;Therapeutic activities;Therapeutic exercise;Patient/family education;Balance training;Functional mobility training;Neuromuscular re-education    PT Goals (Current goals can be found in the Care Plan section)  Acute Rehab PT Goals Patient Stated Goal: to get better PT Goal Formulation: With patient/family Time For Goal Achievement: 11/06/20 Potential to Achieve Goals: Good    Frequency Min 4X/week   Barriers to discharge Decreased caregiver support      Co-evaluation               AM-PAC PT "6 Clicks" Mobility  Outcome Measure Help needed turning from your back to your side while in a flat bed without using bedrails?: A Little Help needed moving from lying on your back to sitting on the side of a flat  bed without using bedrails?: A Little Help needed moving to and from a bed to a chair (including a wheelchair)?: A Lot Help needed standing up from a chair using your arms (e.g., wheelchair or bedside chair)?: A Lot Help needed to walk in hospital room?: A Lot Help needed climbing 3-5 steps with a railing? : Total 6 Click Score: 13    End of Session Equipment Utilized During Treatment: Gait belt Activity Tolerance: Patient tolerated treatment well Patient left: in chair;with call bell/phone within reach;with chair alarm set;with family/visitor present Nurse Communication: Mobility status;Need for lift equipment/+2 assist for safety PT Visit Diagnosis: Difficulty in walking, not elsewhere classified (R26.2);Ataxic gait (R26.0);Other symptoms and signs involving the nervous system (Q46.962)    Time: 9528-4132 PT Time Calculation (min) (ACUTE ONLY): 22 min   Charges:   PT Evaluation $PT Eval Moderate Complexity: 1 Mod             Doreatha Massed, PT Acute Rehabilitation  Office: [phone] Pager: [phone]

## 2020-10-23 NOTE — Progress Notes (Addendum)
[facility]                                                                              Patient Demographics  [name], is a 87 y.o. female, DOB - 18-Jan-1933, YKD:[id number]  Admit date - 10/21/2020   Admitting Physician Harold Hedge, MD  Outpatient Primary MD for the patient is Shirline Frees, MD  Outpatient specialists:   LOS - 2  days   Medical records reviewed and are as summarized below:    Chief Complaint  Patient presents with  . COPD  . Foot Swelling       Brief summary   Patient is 87 year old female with history of COPD, hypertension, hyperlipidemia presented with1.5 weeks of shortness of breath and leg swelling. Due to increased urinary frequency at night, her PCP stopped her HCTZ about two weeks ago. Since then she noticed increased ankle swelling and shortness of breath. Worse with exertion. Sleeps in a reclined bed but has not increased the angle recently. She tried to see her doctor but there were no available appointments and so was instructed to come to the ED for eval. No prior history of CHF, distant history of tobacco use. BNP 273.5 D-dimer 0.65 Doppler ultrasound lower extremities negative for DVT Chest x-ray showed no acute disease  Assessment & Plan    Principal Problem: Acute encephalopathy with tremors, unclear etiology -Overnight events noted, on examination, patient is confused, able to respond to some questions appropriately but repeating words or having difficulty forming words.  No focal neurological deficits but noted to have tremors as well.  Called patient's sister, who lives in [place], has not seen the patient for last 2 years and patient lives alone.  Per patient sister, she talked to her last on Thursday and then yesterday evening when she was alert and oriented.  However at night, an hour or 2 later, when she called again patient was not able to appropriately talk to her. -Stat CT head showed no acute stroke.   Ammonia level, B12, folate, TSH all within normal limits, UA negative for UTI.  Pharmacist reviewed meds with outpatient pharmacy, not on any benzodiazepines or medications that would cause dyskinesia.  No history of alcohol on H&P. -Recheck UDS, MRI brain ordered.  Discussed with neurology on-call, Dr Curly Shores, will evaluate today for consult.   Addendum 3:40PM -Evaluated by neurology, per Dr Curly Shores, stroke work-up initiated, recommended to transfer to [facility] for further work-up and stroke team will follow     Acute respiratory failure with hypoxia ([place]), suspecting new onset CHF -Possibly multifactorial with COPD, may have OSA/OHV  -O2 sats now 94% on 2 L  -On IV Lasix, negative balance of 1.5 L, still bilateral peripheral edema -D-dimer was slightly elevated 0.65, Doppler ultrasound lower extremities negative for DVT -Likely needs outpatient sleep study to rule out OSA -2D echo showed EF of 65 to 70%, mild concentric LVH right ventricular systolic function normal  Active Problems:   Essential hypertension -BP stable, continue Toprol-XL,     COPD (chronic obstructive pulmonary disease) (HCC) -Currently no acute wheezing  Hyperlipidemia - Continue statin  Morbid obesity Estimated body mass index is 41.95 kg/m as calculated from the following:   Height as of this encounter: 4' 11.75" (1.518 m).   Weight as of this encounter: 96.6 kg.  Code Status: Full CODE STATUS DVT Prophylaxis:  enoxaparin (LOVENOX) injection 40 mg Start: 10/21/20 2000   Level of Care: Level of care: Telemetry Family Communication: Discussed all imaging results, lab results, explained to the patient's sister, Dillon Bjork on the phone, phone [phone]   Disposition Plan:     Status is: Inpatient  Remains inpatient appropriate because:Inpatient level of care appropriate due to severity of illness   Dispo: The patient is from: Home              Anticipated d/c is to: Home               Patient currently is not medically stable to d/c.  Ongoing neurological issue, transferring to [facility]    Difficult to place patient No      Time Spent in minutes   35 minutes  Procedures:  2D echo  Consultants:   None  Antimicrobials:   Anti-infectives (From admission, onward)   None         Medications  Scheduled Meds: . enoxaparin (LOVENOX) injection  40 mg Subcutaneous Q24H  . furosemide  20 mg Intravenous BID  . metoprolol succinate  75 mg Oral Daily  . simvastatin  5 mg Oral Daily  . sodium chloride flush  3 mL Intravenous Q12H   Continuous Infusions: PRN Meds:.acetaminophen, polyethylene glycol      Subjective:   [name] was seen and examined today.  Patient alert and awake, noted tremors and head bobbing, repeating words having difficulty forming words.  Answering some questions appropriately after much help effort with difficulty forming words.  No focal weakness  Objective:   Vitals:   10/22/20 2010 10/22/20 2015 10/22/20 2058 10/23/20 0404  BP: (!) 138/118 123/74 103/60 (!) 153/68  Pulse: 74 71 99 82  Resp: 16  17 16   Temp: 98.6 F (37 C)  99 F (37.2 C) 98.3 F (36.8 C)  TempSrc: Oral  Oral Oral  SpO2: 91% 95%  94%  Weight:    96.6 kg  Height:        Intake/Output Summary (Last 24 hours) at 10/23/2020 1108 Last data filed at 10/23/2020 0900 Gross per 24 hour  Intake 520 ml  Output 1050 ml  Net -530 ml     Wt Readings from Last 3 Encounters:  10/23/20 96.6 kg  08/25/14 91.2 kg  10/14/13 86.2 kg    Physical Exam  General: Alert and oriented x self, place, difficulty forming words, head-bobbing  Cardiovascular: S1 S2 clear, RRR. No pedal edema b/l  Respiratory: Decreased breath sound at the bases  Gastrointestinal: Soft, nontender, nondistended, NBS  Ext: 1-2+ pedal edema bilaterally, asterixis  Neuro: no new deficits  Musculoskeletal: No cyanosis, clubbing  Skin: No rashes  Psych: somewhat confused, having  difficulty forming words and repeating    Data Reviewed:  I have personally reviewed following labs and imaging studies  Micro Results Recent Results (from the past 240 hour(s))  Resp Panel by RT-PCR (Flu A&B, Covid) Nasopharyngeal Swab     Status: None   Collection Time: 10/21/20  9:44 AM   Specimen: Nasopharyngeal Swab; Nasopharyngeal(NP) swabs in vial transport medium  Result Value Ref Range Status   SARS Coronavirus 2 by RT PCR NEGATIVE NEGATIVE Final    Comment: (NOTE)  SARS-CoV-2 target nucleic acids are NOT DETECTED.  The SARS-CoV-2 RNA is generally detectable in upper respiratory specimens during the acute phase of infection. The lowest concentration of SARS-CoV-2 viral copies this assay can detect is 138 copies/mL. A negative result does not preclude SARS-Cov-2 infection and should not be used as the sole basis for treatment or other patient management decisions. A negative result may occur with  improper specimen collection/handling, submission of specimen other than nasopharyngeal swab, presence of viral mutation(s) within the areas targeted by this assay, and inadequate number of viral copies(<138 copies/mL). A negative result must be combined with clinical observations, patient history, and epidemiological information. The expected result is Negative.  Fact Sheet for Patients:  EntrepreneurPulse.com.au  Fact Sheet for Healthcare Providers:  IncredibleEmployment.be  This test is no t yet approved or cleared by the [place] FDA and  has been authorized for detection and/or diagnosis of SARS-CoV-2 by FDA under an Emergency Use Authorization (EUA). This EUA will remain  in effect (meaning this test can be used) for the duration of the COVID-19 declaration under Section 564(b)(1) of the Act, 21 U.S.C.section 360bbb-3(b)(1), unless the authorization is terminated  or revoked sooner.       Influenza A by PCR NEGATIVE NEGATIVE  Final   Influenza B by PCR NEGATIVE NEGATIVE Final    Comment: (NOTE) The Xpert Xpress SARS-CoV-2/FLU/RSV plus assay is intended as an aid in the diagnosis of influenza from Nasopharyngeal swab specimens and should not be used as a sole basis for treatment. Nasal washings and aspirates are unacceptable for Xpert Xpress SARS-CoV-2/FLU/RSV testing.  Fact Sheet for Patients: EntrepreneurPulse.com.au  Fact Sheet for Healthcare Providers: IncredibleEmployment.be  This test is not yet approved or cleared by the [place] FDA and has been authorized for detection and/or diagnosis of SARS-CoV-2 by FDA under an Emergency Use Authorization (EUA). This EUA will remain in effect (meaning this test can be used) for the duration of the COVID-19 declaration under Section 564(b)(1) of the Act, 21 U.S.C. section 360bbb-3(b)(1), unless the authorization is terminated or revoked.  Performed at [facility], [facility] [address] [place], [place] [address]     Radiology Reports CT HEAD WO CONTRAST  Result Date: 10/23/2020 CLINICAL DATA:  87 year old female with unexplained altered mental status, confusion. EXAM: CT HEAD WITHOUT CONTRAST TECHNIQUE: Contiguous axial images were obtained from the base of the skull through the vertex without intravenous contrast. COMPARISON:  None. FINDINGS: Study is intermittently degraded by motion artifact despite repeated imaging attempts. Brain: Cerebral volume is within normal limits for age. No ventriculomegaly. No midline shift, mass effect, or evidence of intracranial mass lesion. No acute intracranial hemorrhage identified. No cortically based acute infarct identified. No definite cortical encephalomalacia. Allowing for intermittent motion gray-white matter differentiation appears fairly normal for age throughout the brain. Vascular: Calcified atherosclerosis at the skull base. No suspicious intracranial vascular  hyperdensity. Skull: Intermittent motion. No acute osseous abnormality identified. Sinuses/Orbits: Visualized paranasal sinuses and mastoids are clear. Other: No acute orbit or scalp soft tissue finding. IMPRESSION: Negative for age noncontrast CT appearance of the brain when allowing for motion degradation despite repeated imaging attempts. Electronically Signed   By: Genevie Ann M.D.   On: 10/23/2020 10:23   DG Chest Port 1 View  Result Date: 10/21/2020 CLINICAL DATA:  Shortness of breath and bilateral lower extremity swelling. EXAM: PORTABLE CHEST 1 VIEW COMPARISON:  PA and lateral chest 08/16/2016. FINDINGS: The lungs are clear. Heart size is normal. No pneumothorax or  pleural effusion. Aortic atherosclerosis is noted. No acute or focal bony abnormality. IMPRESSION: No acute disease. Aortic Atherosclerosis (ICD10-I70.0). Electronically Signed   By: Inge Rise M.D.   On: 10/21/2020 09:51   ECHOCARDIOGRAM COMPLETE  Result Date: 10/22/2020    ECHOCARDIOGRAM REPORT   Patient Name:   [name] Date of Exam: 10/22/2020 Medical Rec #:  [id number]     Height:       59.8 in Accession #:    [id number]    Weight:       208.0 lb Date of Birth:  Feb 20, 1933     BSA:          1.893 m Patient Age:    40 years      BP:           136/89 mmHg Patient Gender: F             HR:           84 bpm. Exam Location:  Inpatient Procedure: 2D Echo, Cardiac Doppler and Color Doppler Indications:    R06.02 SOB  History:        Patient has prior history of Echocardiogram examinations. COPD,                 Signs/Symptoms:Shortness of Breath and Dyspnea; Risk                 Factors:Hypertension and Former Smoker. Hypoxia. Edema.  Sonographer:    Roseanna Rainbow RDCS Referring Phys: [id number] Harold Hedge  Sonographer Comments: Technically difficult study due to poor echo windows and patient is morbidly obese. Image acquisition challenging due to patient body habitus and Image acquisition challenging due to COPD. Difficult images, patient  shaking during exam causing interrupted EKG. Patient seems confused at times and had to be repeatedly reminded to try not to talk. IMPRESSIONS  1. Left ventricular ejection fraction, by estimation, is 65 to 70%. The left ventricle has normal function. Left ventricular endocardial border not optimally defined to evaluate regional wall motion. There is mild concentric left ventricular hypertrophy. Left ventricular diastolic parameters are indeterminate.  2. Right ventricular systolic function is normal. The right ventricular size is normal. Tricuspid regurgitation signal is inadequate for assessing PA pressure.  3. The mitral valve is degenerative. No evidence of mitral valve regurgitation. No evidence of mitral stenosis.  4. The aortic valve was not well visualized. Aortic valve regurgitation is not visualized. No aortic stenosis is present.  5. The inferior vena cava is dilated in size with >50% respiratory variability, suggesting right atrial pressure of 8 mmHg. FINDINGS  Left Ventricle: Left ventricular ejection fraction, by estimation, is 65 to 70%. The left ventricle has normal function. Left ventricular endocardial border not optimally defined to evaluate regional wall motion. The left ventricular internal cavity size was normal in size. There is mild concentric left ventricular hypertrophy. Left ventricular diastolic parameters are indeterminate. Right Ventricle: The right ventricular size is normal. No increase in right ventricular wall thickness. Right ventricular systolic function is normal. Tricuspid regurgitation signal is inadequate for assessing PA pressure. Left Atrium: Left atrial size was normal in size. Right Atrium: Right atrial size was normal in size. Pericardium: There is no evidence of pericardial effusion. Mitral Valve: The mitral valve is degenerative in appearance. Mild mitral annular calcification. No evidence of mitral valve regurgitation. No evidence of mitral valve stenosis. Tricuspid  Valve: The tricuspid valve is grossly normal. Tricuspid valve regurgitation is not demonstrated. No evidence of tricuspid  stenosis. Aortic Valve: The aortic valve was not well visualized. Aortic valve regurgitation is not visualized. No aortic stenosis is present. Pulmonic Valve: The pulmonic valve was not well visualized. Pulmonic valve regurgitation is not visualized. No evidence of pulmonic stenosis. Aorta: The aortic root and ascending aorta are structurally normal, with no evidence of dilitation. Venous: The inferior vena cava is dilated in size with greater than 50% respiratory variability, suggesting right atrial pressure of 8 mmHg. IAS/Shunts: The atrial septum is grossly normal.  LEFT VENTRICLE PLAX 2D LVIDd:         4.40 cm     Diastology LVIDs:         3.00 cm     LV e' medial:    6.31 cm/s LV PW:         1.15 cm     LV E/e' medial:  17.1 LV IVS:        1.10 cm     LV e' lateral:   7.62 cm/s LVOT diam:     1.90 cm     LV E/e' lateral: 14.2 LV SV:         62 LV SV Index:   33 LVOT Area:     2.84 cm  LV Volumes (MOD) LV vol d, MOD A2C: 33.6 ml LV vol d, MOD A4C: 37.3 ml LV vol s, MOD A2C: 10.2 ml LV vol s, MOD A4C: 10.3 ml LV SV MOD A2C:     23.4 ml LV SV MOD A4C:     37.3 ml LV SV MOD BP:      24.9 ml RIGHT VENTRICLE             IVC RV S prime:     12.50 cm/s  IVC diam: 2.30 cm TAPSE (M-mode): 2.0 cm LEFT ATRIUM             Index       RIGHT ATRIUM           Index LA diam:        3.30 cm 1.74 cm/m  RA Area:     14.80 cm LA Vol (A2C):   20.3 ml 10.73 ml/m RA Volume:   36.70 ml  19.39 ml/m LA Vol (A4C):   24.3 ml 12.84 ml/m LA Biplane Vol: 22.6 ml 11.94 ml/m  AORTIC VALVE LVOT Vmax:   123.00 cm/s LVOT Vmean:  73.400 cm/s LVOT VTI:    0.219 m  AORTA Ao Root diam: 2.70 cm Ao Asc diam:  2.90 cm MITRAL VALVE MV Area (PHT): 4.68 cm     SHUNTS MV Decel Time: 162 msec     Systemic VTI:  0.22 m MV E velocity: 108.00 cm/s  Systemic Diam: 1.90 cm MV A velocity: 112.00 cm/s MV E/A ratio:  0.96 Eleonore Chiquito MD  Electronically signed by Eleonore Chiquito MD Signature Date/Time: 10/22/2020/1:07:10 PM    Final    VAS [place] LOWER EXTREMITY VENOUS (DVT) (ONLY [facility] & [facility])  Result Date: 10/22/2020  Lower Venous DVT Study Indications: Swelling.  Risk Factors: None identified. Limitations: Body habitus and poor ultrasound/tissue interface. Comparison Study: No prior studies. Performing Technologist: Oliver Hum RVT  Examination Guidelines: A complete evaluation includes B-mode imaging, spectral Doppler, color Doppler, and power Doppler as needed of all accessible portions of each vessel. Bilateral testing is considered an integral part of a complete examination. Limited examinations for reoccurring indications may be performed as noted. The reflux portion of the exam is performed with the patient  in reverse Trendelenburg.  +---------+---------------+---------+-----------+----------+--------------+ RIGHT    CompressibilityPhasicitySpontaneityPropertiesThrombus Aging +---------+---------------+---------+-----------+----------+--------------+ CFV      Full           Yes      Yes                                 +---------+---------------+---------+-----------+----------+--------------+ SFJ      Full                                                        +---------+---------------+---------+-----------+----------+--------------+ FV Prox  Full                                                        +---------+---------------+---------+-----------+----------+--------------+ FV Mid   Full                                                        +---------+---------------+---------+-----------+----------+--------------+ FV DistalFull                                                        +---------+---------------+---------+-----------+----------+--------------+ PFV      Full                                                         +---------+---------------+---------+-----------+----------+--------------+ POP      Full           Yes      Yes                                 +---------+---------------+---------+-----------+----------+--------------+ PTV      Full                                                        +---------+---------------+---------+-----------+----------+--------------+ PERO     Full                                                        +---------+---------------+---------+-----------+----------+--------------+   +---------+---------------+---------+-----------+----------+--------------+ LEFT     CompressibilityPhasicitySpontaneityPropertiesThrombus Aging +---------+---------------+---------+-----------+----------+--------------+ CFV      Full           Yes      Yes                                 +---------+---------------+---------+-----------+----------+--------------+  SFJ      Full                                                        +---------+---------------+---------+-----------+----------+--------------+ FV Prox  Full                                                        +---------+---------------+---------+-----------+----------+--------------+ FV Mid   Full                                                        +---------+---------------+---------+-----------+----------+--------------+ FV Distal               Yes      Yes                                 +---------+---------------+---------+-----------+----------+--------------+ PFV      Full                                                        +---------+---------------+---------+-----------+----------+--------------+ POP      Full           Yes      Yes                                 +---------+---------------+---------+-----------+----------+--------------+ PTV      Full                                                         +---------+---------------+---------+-----------+----------+--------------+ PERO     Full                                                        +---------+---------------+---------+-----------+----------+--------------+     Summary: RIGHT: - There is no evidence of deep vein thrombosis in the lower extremity. However, portions of this examination were limited- see technologist comments above.  - No cystic structure found in the popliteal fossa.  LEFT: - There is no evidence of deep vein thrombosis in the lower extremity. However, portions of this examination were limited- see technologist comments above.  - No cystic structure found in the popliteal fossa.  *See table(s) above for measurements and observations. Electronically signed by Harold Barban MD on 10/22/2020 at 4:19:18 PM.    Final     Lab Data:  CBC: Recent Labs  Lab 10/21/20 0944 10/22/20  2706 10/23/20 0435  WBC 7.9 7.8 9.2  HGB 13.8 13.8 14.0  HCT 45.3 47.9* 47.4*  MCV 99.6 102.4* 102.8*  PLT 153 163 237   Basic Metabolic Panel: Recent Labs  Lab 10/21/20 0944 10/22/20 0436 10/22/20 2310 10/23/20 0435  NA 140 142 140 139  K 4.7 4.9 5.0 4.8  CL 99 97* 94* 92*  CO2 33* 34* 33* 36*  GLUCOSE 105* 136* 136* 132*  BUN 17 25* 27* 29*  CREATININE 0.98 1.04* 1.10* 1.02*  CALCIUM 9.0 8.9 8.8* 8.9  MG  --   --  2.3  --    GFR: Estimated Creatinine Clearance: 41.8 mL/min (A) (by C-G formula based on SCr of 1.02 mg/dL (H)). Liver Function Tests: Recent Labs  Lab 10/22/20 2310  AST 21  ALT 26  ALKPHOS 52  BILITOT 0.6  PROT 6.8  ALBUMIN 3.6   No results for input(s): LIPASE, AMYLASE in the last 168 hours. No results for input(s): AMMONIA in the last 168 hours. Coagulation Profile: No results for input(s): INR, PROTIME in the last 168 hours. Cardiac Enzymes: No results for input(s): CKTOTAL, CKMB, CKMBINDEX, TROPONINI in the last 168 hours. BNP (last 3 results) No results for input(s): PROBNP in the last 8760  hours. HbA1C: No results for input(s): HGBA1C in the last 72 hours. CBG: Recent Labs  Lab 10/22/20 0356 10/22/20 2127 10/23/20 1039  GLUCAP 131* 136* 139*   Lipid Profile: No results for input(s): CHOL, HDL, LDLCALC, TRIG, CHOLHDL, LDLDIRECT in the last 72 hours. Thyroid Function Tests: Recent Labs    10/23/20 0906  TSH 0.540   Anemia Panel: Recent Labs    10/23/20 0906  VITAMINB12 571  FOLATE 14.9   Urine analysis:    Component Value Date/Time   COLORURINE STRAW (A) 10/21/2020 1229   APPEARANCEUR CLEAR 10/21/2020 1229   LABSPEC 1.009 10/21/2020 1229   PHURINE 5.0 10/21/2020 1229   GLUCOSEU NEGATIVE 10/21/2020 1229   HGBUR NEGATIVE 10/21/2020 1229   BILIRUBINUR NEGATIVE 10/21/2020 [place] 10/21/2020 1229   PROTEINUR NEGATIVE 10/21/2020 1229   UROBILINOGEN 0.2 01/27/2009 1018   NITRITE NEGATIVE 10/21/2020 1229   LEUKOCYTESUR NEGATIVE 10/21/2020 1229     Dollye Glasser M.D. [facility] 10/23/2020, 11:08 AM  Available via Epic secure chat 7am-7pm After 7 pm, please refer to night coverage provider listed on amion.

## 2020-10-24 ENCOUNTER — Inpatient Hospital Stay (HOSPITAL_COMMUNITY): Payer: Medicare Other

## 2020-10-24 DIAGNOSIS — J9601 Acute respiratory failure with hypoxia: Secondary | ICD-10-CM | POA: Diagnosis not present

## 2020-10-24 DIAGNOSIS — I639 Cerebral infarction, unspecified: Secondary | ICD-10-CM

## 2020-10-24 DIAGNOSIS — E8770 Fluid overload, unspecified: Secondary | ICD-10-CM | POA: Diagnosis present

## 2020-10-24 DIAGNOSIS — J42 Unspecified chronic bronchitis: Secondary | ICD-10-CM | POA: Diagnosis not present

## 2020-10-24 DIAGNOSIS — R251 Tremor, unspecified: Secondary | ICD-10-CM | POA: Diagnosis not present

## 2020-10-24 DIAGNOSIS — I160 Hypertensive urgency: Secondary | ICD-10-CM | POA: Diagnosis not present

## 2020-10-24 LAB — RPR: RPR Ser Ql: NONREACTIVE

## 2020-10-24 LAB — CBC
HCT: 45.2 % (ref 36.0–46.0)
Hemoglobin: 13.8 g/dL (ref 12.0–15.0)
MCH: 29.8 pg (ref 26.0–34.0)
MCHC: 30.5 g/dL (ref 30.0–36.0)
MCV: 97.6 fL (ref 80.0–100.0)
Platelets: 145 10*3/uL — ABNORMAL LOW (ref 150–400)
RBC: 4.63 MIL/uL (ref 3.87–5.11)
RDW: 12.6 % (ref 11.5–15.5)
WBC: 9 10*3/uL (ref 4.0–10.5)
nRBC: 0 % (ref 0.0–0.2)

## 2020-10-24 LAB — BASIC METABOLIC PANEL
Anion gap: 9 (ref 5–15)
BUN: 28 mg/dL — ABNORMAL HIGH (ref 8–23)
CO2: 42 mmol/L — ABNORMAL HIGH (ref 22–32)
Calcium: 9 mg/dL (ref 8.9–10.3)
Chloride: 87 mmol/L — ABNORMAL LOW (ref 98–111)
Creatinine, Ser: 1.11 mg/dL — ABNORMAL HIGH (ref 0.44–1.00)
GFR, Estimated: 49 mL/min — ABNORMAL LOW (ref >60)
Glucose, Bld: 108 mg/dL — ABNORMAL HIGH (ref 70–99)
Potassium: 4.6 mmol/L (ref 3.5–5.1)
Sodium: 138 mmol/L (ref 135–145)

## 2020-10-24 LAB — LIPID PANEL
Cholesterol: 169 mg/dL (ref 0–200)
HDL: 47 mg/dL (ref >40)
LDL Cholesterol: 105 mg/dL — ABNORMAL HIGH (ref 0–99)
Total CHOL/HDL Ratio: 3.6 RATIO
Triglycerides: 84 mg/dL (ref <150)
VLDL: 17 mg/dL (ref 0–40)

## 2020-10-24 LAB — HIV ANTIBODY (ROUTINE TESTING W REFLEX): HIV Screen 4th Generation wRfx: NONREACTIVE

## 2020-10-24 LAB — POCT I-STAT 7, (LYTES, BLD GAS, ICA,H+H)
Acid-Base Excess: 22 mmol/L — ABNORMAL HIGH (ref 0.0–2.0)
Bicarbonate: 49 mmol/L — ABNORMAL HIGH (ref 20.0–28.0)
Calcium, Ion: 1.1 mmol/L — ABNORMAL LOW (ref 1.15–1.40)
HCT: 43 % (ref 36.0–46.0)
Hemoglobin: 14.6 g/dL (ref 12.0–15.0)
O2 Saturation: 98 %
Patient temperature: 37.7
Potassium: 4.1 mmol/L (ref 3.5–5.1)
Sodium: 137 mmol/L (ref 135–145)
TCO2: >50 mmol/L — ABNORMAL HIGH (ref 22–32)
pCO2 arterial: 64.8 mmHg — ABNORMAL HIGH (ref 32.0–48.0)
pH, Arterial: 7.489 — ABNORMAL HIGH (ref 7.350–7.450)
pO2, Arterial: 100 mmHg (ref 83.0–108.0)

## 2020-10-24 LAB — HEMOGLOBIN A1C
Hgb A1c MFr Bld: 5.9 % — ABNORMAL HIGH (ref 4.8–5.6)
Mean Plasma Glucose: 122.63 mg/dL

## 2020-10-24 LAB — BRAIN NATRIURETIC PEPTIDE: B Natriuretic Peptide: 262.4 pg/mL — ABNORMAL HIGH (ref 0.0–100.0)

## 2020-10-24 LAB — CERULOPLASMIN: Ceruloplasmin: 32.4 mg/dL (ref 19.0–39.0)

## 2020-10-24 LAB — GLUCOSE, CAPILLARY
Glucose-Capillary: 117 mg/dL — ABNORMAL HIGH (ref 70–99)
Glucose-Capillary: 118 mg/dL — ABNORMAL HIGH (ref 70–99)
Glucose-Capillary: 123 mg/dL — ABNORMAL HIGH (ref 70–99)
Glucose-Capillary: 128 mg/dL — ABNORMAL HIGH (ref 70–99)
Glucose-Capillary: 132 mg/dL — ABNORMAL HIGH (ref 70–99)
Glucose-Capillary: 92 mg/dL (ref 70–99)

## 2020-10-24 LAB — MRSA PCR SCREENING: MRSA by PCR: NEGATIVE

## 2020-10-24 IMAGING — MR MR MRA HEAD W/O CM
1 series · 19 of 48 positions shown · non-contrast
Comparison: Brain MRI [DATE].

CLINICAL DATA: 87-year-old female with small acute left centrum
semiovale white matter infarct on MRI yesterday.

EXAM:
MRA HEAD WITHOUT CONTRAST
TECHNIQUE: Angiographic images of the Circle of Willis were obtained using MRA
technique without intravenous contrast.

[Series 5: 3d cow · axial · 0.5mm · 0.41mm/px · z∈[-94,+2]mm · 19 of 208 slices shown]
[im 1/208]
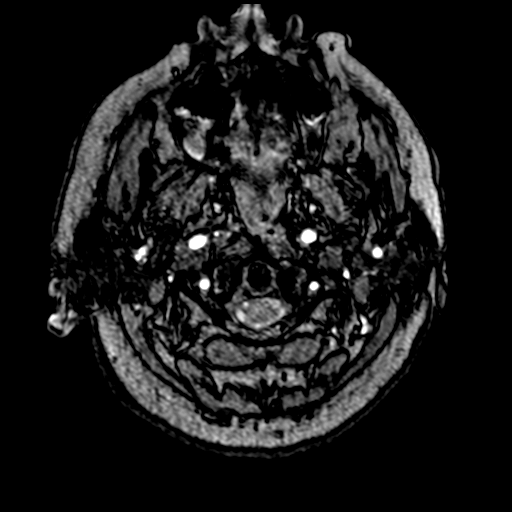
[im 5/208]
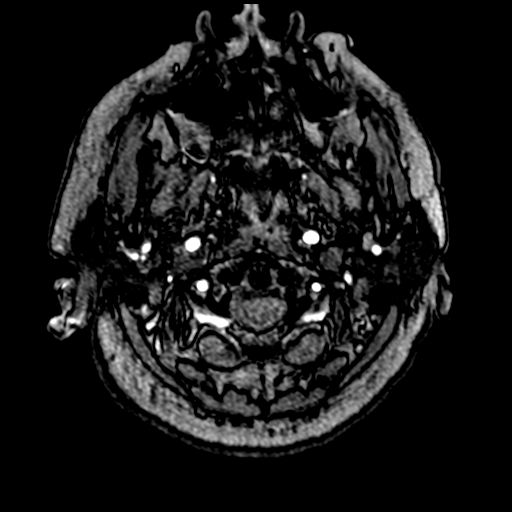
[im 9/208]
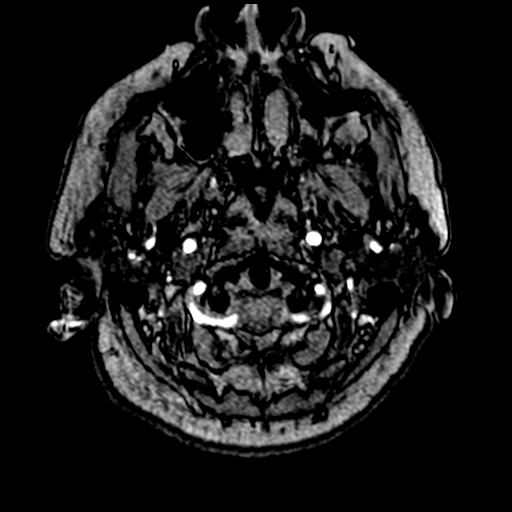
[im 14/208]
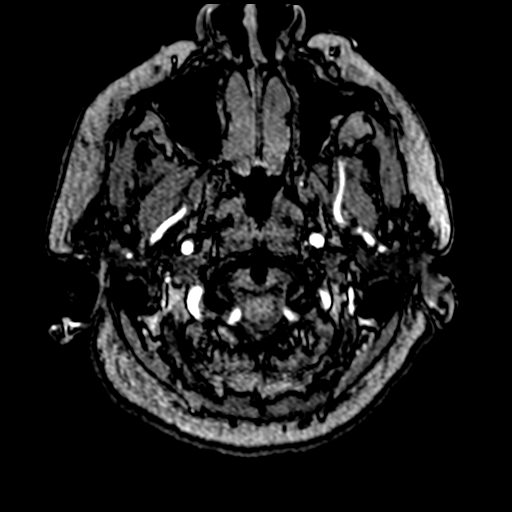
[im 18/208]
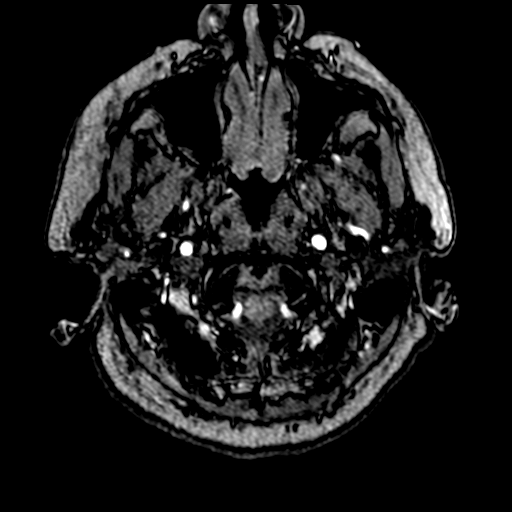
[im 23/208]
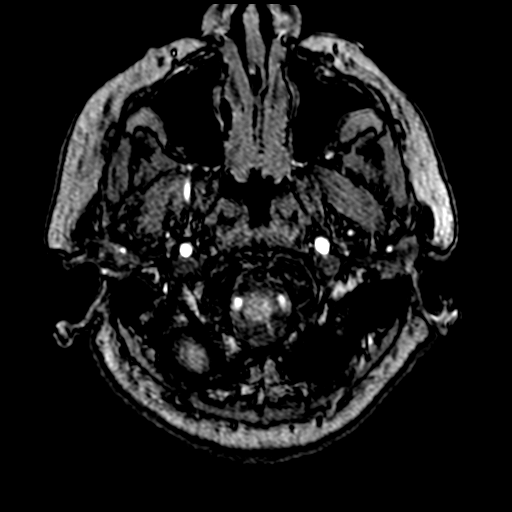
[im 27/208]
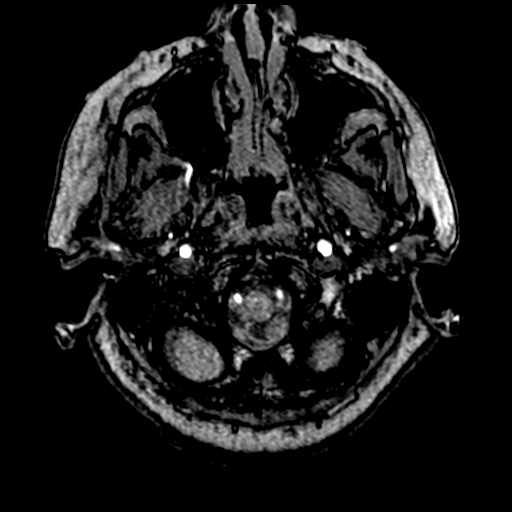
[im 31/208]
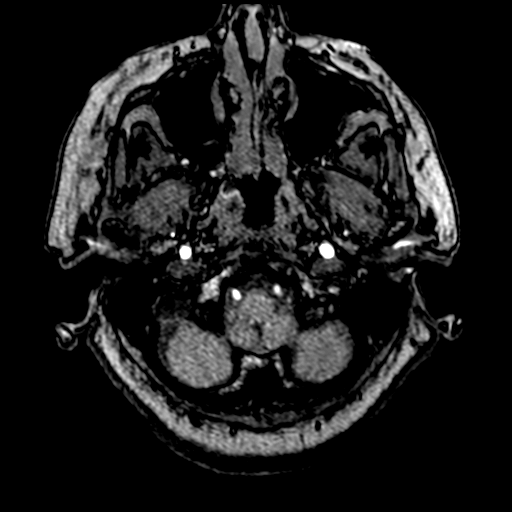
[im 36/208]
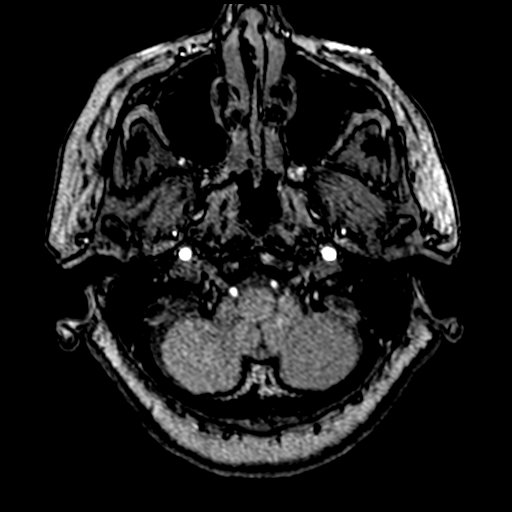
[im 40/208]
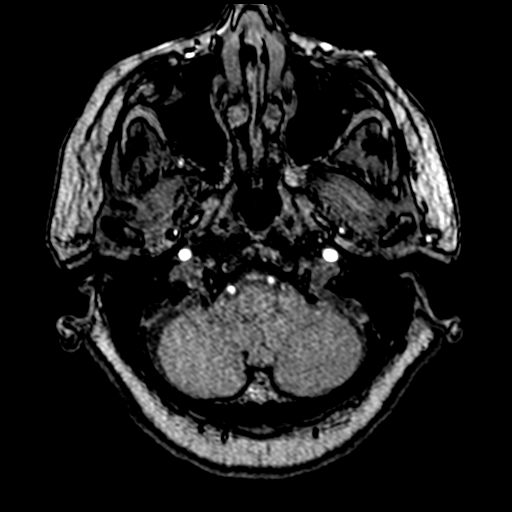
[im 45/208]
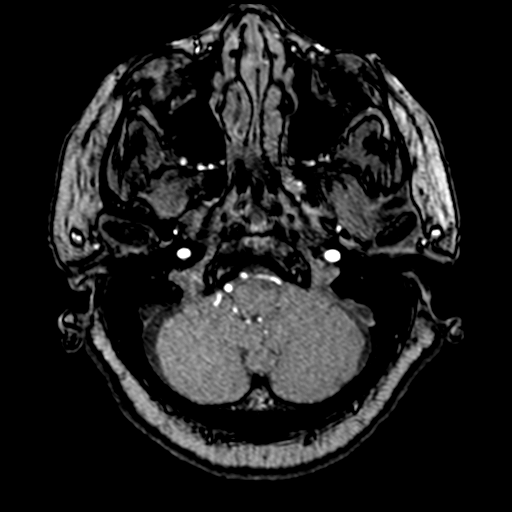
[im 67/208]
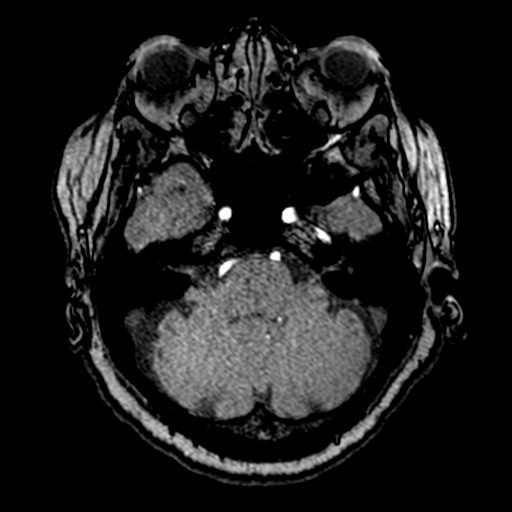
[im 93/208]
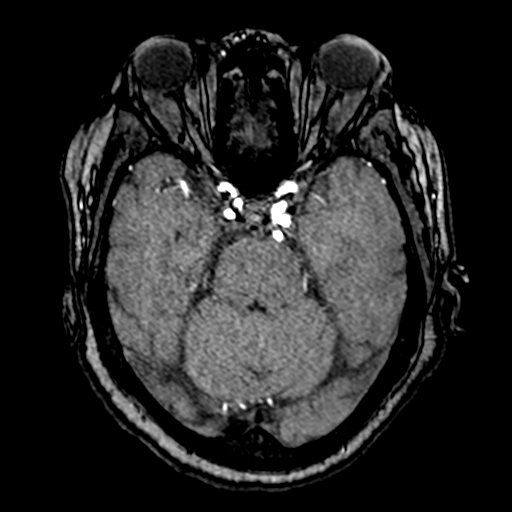
[im 106/208]
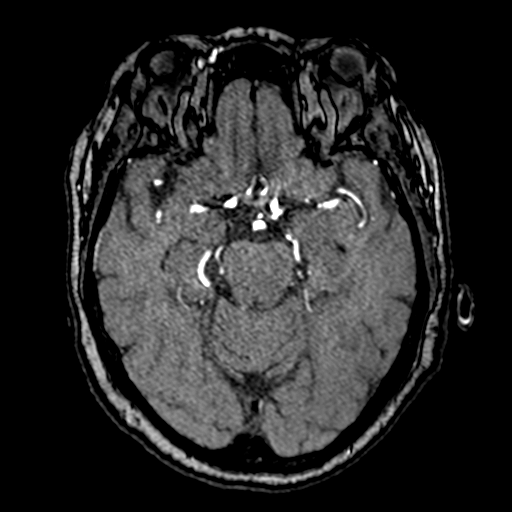
[im 119/208]
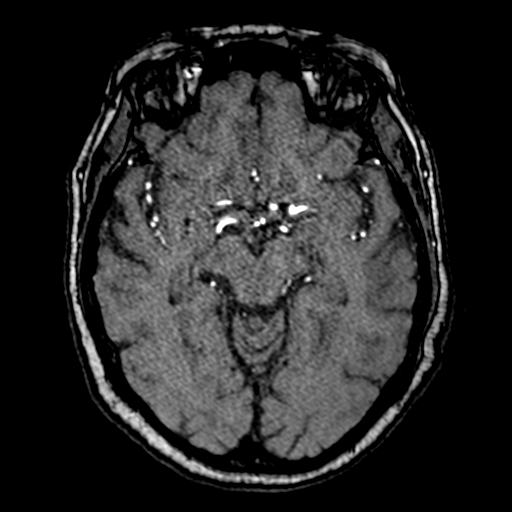
[im 146/208]
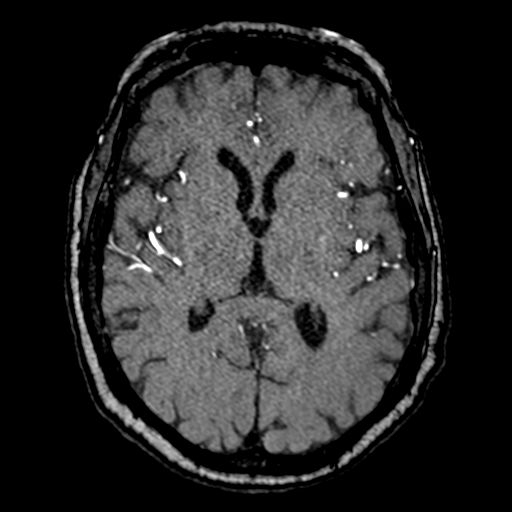
[im 172/208]
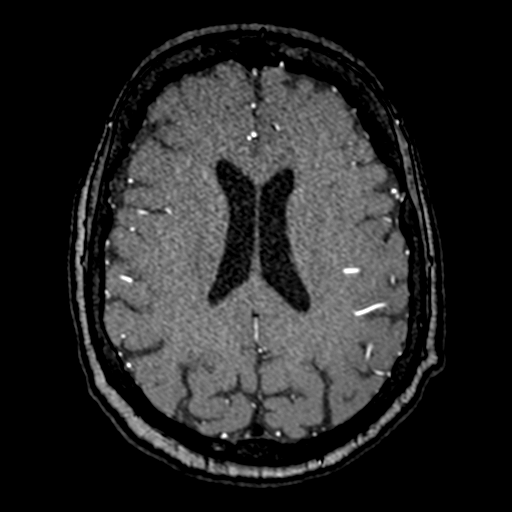
[im 177/208]
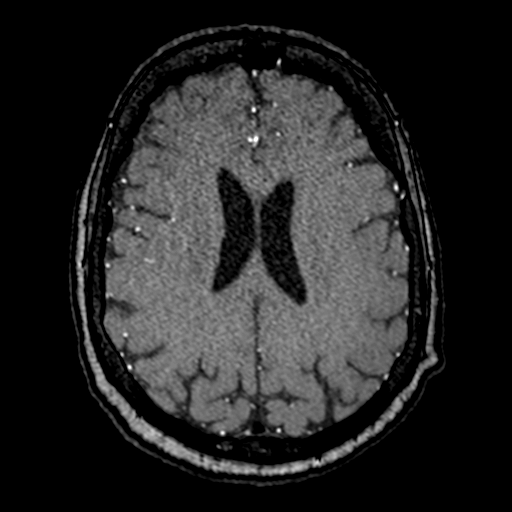
[im 199/208]
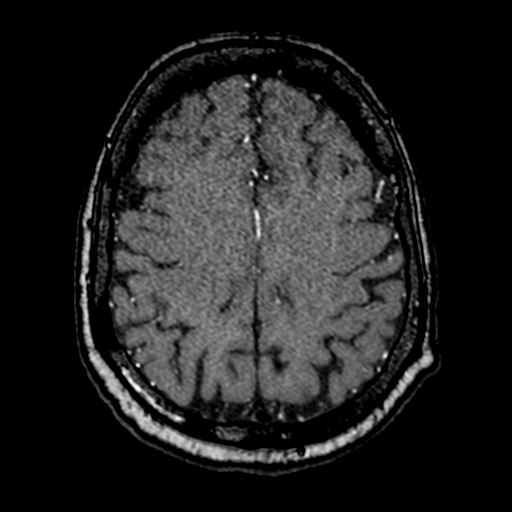

[19 of 48 positions shown; findings below may reference images not displayed]

FINDINGS: No intracranial mass effect or ventriculomegaly.

Antegrade flow in the posterior circulation with dominant distal
right vertebral artery. No right vertebral or vertebrobasilar
junction stenosis. Non dominant left vertebral artery appears more
irregular, a especially near the vertebrobasilar junction with up to
moderate stenosis on series [67], image 10. Normal PICA origins.

Patent basilar artery with tortuosity and mild irregularity. No
significant basilar stenosis. SCA and right PCA origins are within
normal limits. Fetal type left PCA origin with small left P1. Right
posterior communicating artery is diminutive or absent. Mild
bilateral PCA irregularity. Mildly tortuous left PCA P2 segment.

Antegrade flow in both ICA siphons. Evidence of bilateral siphon
calcified plaque. Tortuous cavernous segments. No significant left
siphon stenosis. Mild to moderate supraclinoid right ICA stenosis.
Normal ophthalmic and left posterior communicating artery origins.
Patent carotid termini. Patent MCA and ACA origins. Dominant left
ACA A1 segment. Anterior communicating artery and visible ACA
branches are within normal limits. MCA M1 segments and bifurcations
are patent without stenosis. Tortuous right M1. Visible bilateral
MCA branches are within normal limits.
IMPRESSION: 1. Negative for large vessel occlusion, positive for widespread
intracranial atherosclerosis.
2. Up to moderate stenosis of the Left Vertebrobasilar junction,
supraclinoid Right ICA.

## 2020-10-24 MED ORDER — ALBUTEROL SULFATE (2.5 MG/3ML) 0.083% IN NEBU: 2.5000 mg | INHALATION_SOLUTION | RESPIRATORY_TRACT | Status: DC | PRN

## 2020-10-24 MED ORDER — ORAL CARE MOUTH RINSE
15.0000 mL | Freq: Two times a day (BID) | OROMUCOSAL | Status: DC
  Administered 2020-10-24: 15 mL via OROMUCOSAL

## 2020-10-24 NOTE — Progress Notes (Signed)
Occupational Therapy Evaluation Patient Details Name: [name] MRN: [id number] DOB: 11-03-1932 Today's Date: 10/24/2020    History of Present Illness 87 yo with COPD, Heart failure admitted to [facility] with volume overload. Intubated 3/13 - 14 for AMS, suspected pulm edema, possible sz and transferred to [facility] for further assessment and care. MRI + Small acute left frontal white matter infarct. EEG 3/14 - moderate diffuse encephalopathy.   Clinical Impression   PTA pt lives alone independently, drives and manages her own medications/finances. Pt lethargic during assessment however appropriately responding at times to questions.  Requires Max A +2 for mobility and is currently total A for all ADL tasks secondary to deficits listed below. At this time recommend rehab at [facility]. Family is working on setting up 24/7 assistance after DC. Will follow acutely.     Follow Up Recommendations  CIR;Supervision/Assistance - 24 hour    Equipment Recommendations  3 in 1 bedside commode    Recommendations for Other Services       Precautions / Restrictions Precautions Precautions: Fall Precaution Comments: jerking UE/LE Restrictions Weight Bearing Restrictions: No      Mobility Bed Mobility Overal bed mobility: Needs Assistance Bed Mobility: Supine to Sit;Sit to Supine     Supine to sit: Max assist;+2 for physical assistance Sit to supine: Max assist;+2 for physical assistance   General bed mobility comments: max +2 for trunk and LE management, scooting to/from EOB, boost up in bed upon return to supine. VC for sequencing task.    Transfers Overall transfer level: Needs assistance   Transfers: Sit to/from Stand Sit to Stand: Mod assist;+2 safety/equipment         General transfer comment: Mod +2 for power up, rise, and steadying upon standing, pt with heavy posterior bias. Able to take x3 lateral steps towards [facility]    Balance Overall balance assessment: Needs assistance   Sitting  balance-Leahy Scale: Poor       Standing balance-Leahy Scale: Poor                             ADL either performed or assessed with clinical judgement   ADL                                               Vision   Additional Comments: will assess; eyes closed 95% of session     Perception Perception Comments: will assess   Praxis Praxis Praxis-Other Comments: impaired; affected by level of arousal and confusion    Pertinent Vitals/Pain Pain Assessment: Faces Faces Pain Scale: No hurt Pain Intervention(s): Limited activity within patient's tolerance;Monitored during session     Hand Dominance Right   Extremity/Trunk Assessment Upper Extremity Assessment Upper Extremity Assessment: Difficult to assess due to impaired cognition (Moving BUE equally; difficulty controlling movements due to apparent asterixis; unable to sustain grip on toothetteor washcloth)   Lower Extremity Assessment Lower Extremity Assessment: Defer to PT evaluation   Cervical / Trunk Assessment Cervical / Trunk Assessment: Other exceptions (posterior bias)   Communication Communication Communication: Other (comment) (communication affected by level of arousal)   Cognition Arousal/Alertness: Lethargic Behavior During Therapy: Restless Overall Cognitive Status: Impaired/Different from baseline Area of Impairment: Orientation;Attention;Memory;Following commands;Safety/judgement;Awareness;Problem solving                 Orientation Level:  Disoriented to;Place;Situation;Time Current Attention Level: Focused Memory: Decreased short-term memory Following Commands: Follows one step commands inconsistently;Follows one step commands with increased time Safety/Judgement: Decreased awareness of deficits;Decreased awareness of safety Awareness: Intellectual Problem Solving: Slow processing;Decreased initiation;Difficulty sequencing;Requires verbal cues;Requires tactile  cues General Comments: appears to be hallucinating; most likely delirius   General Comments  Pt's niece Pam present for session    Exercises     Shoulder Instructions      Home Living Family/patient expects to be discharged to:: Private residence Living Arrangements: Alone Available Help at Discharge: Family;Available 24 hours/day Type of Home: House Home Access: Level entry;Stairs to enter Entrance Stairs-Number of Steps: 2 steps from garage   Home Layout: One level     Bathroom Shower/Tub: Occupational psychologist: Standard Bathroom Accessibility: Yes How Accessible: Accessible via walker Home Equipment: [place] - 2 wheels;Shower seat - built in;Grab bars - tub/shower          Prior Functioning/Environment Level of Independence: Independent with assistive device(s)        Comments: drives; manages finances        OT Problem List: Decreased strength;Decreased activity tolerance;Impaired balance (sitting and/or standing);Decreased coordination;Decreased cognition;Decreased safety awareness;Decreased knowledge of use of DME or AE;Cardiopulmonary status limiting activity;Obesity      OT Treatment/Interventions: Self-care/ADL training;Therapeutic exercise;Neuromuscular education;Energy conservation;DME and/or AE instruction;Therapeutic activities;Cognitive remediation/compensation;Patient/family education;Balance training    OT Goals(Current goals can be found in the care plan section) Acute Rehab OT Goals Patient Stated Goal: for pt to get better; pt wants a cheeseburger OT Goal Formulation: With family Time For Goal Achievement: 11/07/20 Potential to Achieve Goals: Good  OT Frequency: Min 2X/week   Barriers to D/C:            Co-evaluation PT/OT/SLP Co-Evaluation/Treatment: Yes Reason for Co-Treatment: To address functional/ADL transfers;For patient/therapist safety PT goals addressed during session: Mobility/safety with mobility;Balance OT goals  addressed during session: ADL's and self-care      AM-PAC OT "6 Clicks" Daily Activity     Outcome Measure Help from another person eating meals?: Total Help from another person taking care of personal grooming?: Total Help from another person toileting, which includes using toliet, bedpan, or urinal?: Total Help from another person bathing (including washing, rinsing, drying)?: Total Help from another person to put on and taking off regular upper body clothing?: Total Help from another person to put on and taking off regular lower body clothing?: Total 6 Click Score: 6   End of Session Equipment Utilized During Treatment: Gait belt Nurse Communication: Mobility status  Activity Tolerance: Patient tolerated treatment well Patient left: in bed;with call bell/phone within reach;with bed alarm set;with family/visitor present;with SCD's reapplied (chair position)  OT Visit Diagnosis: Unsteadiness on feet (R26.81);Other abnormalities of gait and mobility (R26.89);Muscle weakness (generalized) (M62.81);Other symptoms and signs involving cognitive function                Time: 1338-1405 OT Time Calculation (min): 27 min Charges:  OT Evaluation $OT Eval Moderate Complexity: [place], OT/L   Acute OT Clinical Specialist Acute Rehabilitation Services Pager [phone] Office [phone]   [facility] [place] 10/24/2020, 3:08 PM

## 2020-10-24 NOTE — Progress Notes (Signed)
Transitions of Care Team following this patient for discharge planning.  Currently, patient not medically stable; will continue to follow as patient progresses.    Augustine Leverette LCSW  

## 2020-10-24 NOTE — Progress Notes (Signed)
STROKE TEAM PROGRESS NOTE   INTERVAL HISTORY I have personally reviewed history of presenting illness, electronic medical records and pertinent imaging films in PACS.  She presented with couple weeks of shortness of breath and leg edema and was intubated for cardiorespiratory failure.  MRI scan shows a tiny punctate left corona radiata lacunar infarct likely from small vessel disease.  Carotid ultrasound that showed 49% left ICA stenosis do not think this is hemodynamically significant symptomatic.  Vitals:   10/24/20 0802 10/24/20 0803 10/24/20 0816 10/24/20 0900  BP:    129/77  Pulse: 68   71  Resp: 16   16  Temp:    (!) 97.34 F (36.3 C)  TempSrc:      SpO2: 100% 100% 100% 98%  Weight:      Height:       CBC:  Recent Labs  Lab 10/23/20 0435 10/24/20 0015 10/24/20 0135  WBC 9.2  --  9.0  HGB 14.0 14.6 13.8  HCT 47.4* 43.0 45.2  MCV 102.8*  --  97.6  PLT 163  --  829*   Basic Metabolic Panel:  Recent Labs  Lab 10/22/20 2310 10/23/20 0435 10/24/20 0015 10/24/20 0135  NA 140 139 137 138  K 5.0 4.8 4.1 4.6  CL 94* 92*  --  87*  CO2 33* 36*  --  42*  GLUCOSE 136* 132*  --  108*  BUN 27* 29*  --  28*  CREATININE 1.10* 1.02*  --  1.11*  CALCIUM 8.8* 8.9  --  9.0  MG 2.3  --   --   --    Lipid Panel:  Recent Labs  Lab 10/24/20 0135  CHOL 169  TRIG 84  HDL 47  CHOLHDL 3.6  VLDL 17  LDLCALC 105*   HgbA1c:  Recent Labs  Lab 10/24/20 0135  HGBA1C 5.9*   Urine Drug Screen:  Recent Labs  Lab 10/23/20 1422  LABOPIA NONE DETECTED  COCAINSCRNUR NONE DETECTED  LABBENZ NONE DETECTED  AMPHETMU NONE DETECTED  THCU NONE DETECTED  LABBARB NONE DETECTED    Alcohol Level No results for input(s): ETH in the last 168 hours.  IMAGING past 24 hours MR ANGIO HEAD WO CONTRAST  Normal PICA origins. Patent basilar artery with tortuosity and mild irregularity. No significant basilar stenosis. SCA and right PCA origins are within normal limits. Fetal type left PCA origin  with small left P1. Right posterior communicating artery is diminutive or absent. Mild bilateral PCA irregularity. Mildly tortuous left PCA P2 segment. Antegrade flow in both ICA siphons. Evidence of bilateral siphon calcified plaque. Tortuous cavernous segments. No significant left siphon stenosis. Mild to moderate supraclinoid right ICA stenosis. Normal ophthalmic and left posterior communicating artery origins. Patent carotid termini. Patent MCA and ACA origins. Dominant left ACA A1 segment. Anterior communicating artery and visible ACA branches are within normal limits. MCA M1 segments and bifurcations are patent without stenosis. Tortuous right M1. Visible bilateral MCA branches are within normal limits.  IMPRESSION:  1. Negative for large vessel occlusion, positive for widespread intracranial atherosclerosis. 2. Up to moderate stenosis of the Left Vertebrobasilar junction, supraclinoid Right ICA.  MR BRAIN WO CONTRAST Result Date: 10/23/2020 IMPRESSION: 1. Small acute left frontal white matter infarct. 2. Mild chronic small vessel ischemic disease.  DG Chest Port 1 View Result Date: 10/23/2020 IMPRESSION: 1. Minimal left basilar consolidation, favor atelectasis, stable. 2. Unremarkable endotracheal tube.   DG Chest Port 1 View Result Date: 10/23/2020 IMPRESSION: Endotracheal tube tip about  1.7 cm superior to the carina. Patchy airspace disease at the left base, atelectasis versus minimal pneumonia versus aspiration   ECHO Result Date 10/22/2020 1. Left ventricular ejection fraction, by estimation, is 65 to 70%. The  left ventricle has normal function. Left ventricular endocardial border  not optimally defined to evaluate regional wall motion. There is mild  concentric left ventricular hypertrophy. Left ventricular diastolic parameters are indeterminate.  2. Right ventricular systolic function is normal. The right ventricular  size is normal. Tricuspid regurgitation signal is inadequate for  assessing PA pressure.  3. The mitral valve is degenerative. No evidence of mitral valve  regurgitation. No evidence of mitral stenosis.  4. The aortic valve was not well visualized. Aortic valve regurgitation  is not visualized. No aortic stenosis is present.  5. The inferior vena cava is dilated in size with >50% respiratory  variability, suggesting right atrial pressure of 8 mmHg.  Lower extremity dopplars Result Date 10/22/2020 There is no evidence of deep vein thrombosis in the lower extremities  PHYSICAL EXAM Physical Exam  Obese elderly lady who is sedated and intubated. HEENT: [place]/AT Lungs: Intubated Ext: Warm and well-perfused  Neuro: Mental Status: Exam performed while on sedation. Intubated. Eyes closed but will remain partially open after lids are elevated by examiner. No attempts to communicate.  Cranial Nerves: II:  No blink to threat. Pupils equal, round and sluggishly reactive to light bilaterally 4 mm >> 3 mm.  III,IV, VI: Eyes are conjugate at the midline. No nystagmus.  V,VII: Face flaccidly symmetric. No blink to eyelash stimulation (sedated).  VIII: No response to auditory stimuli (sedated) IX,X: Intubated XI: Unable to assess XII: Intubated Motor: Weakly flicker to RUE, withdraws LUE  Withdraws BLE to painful stimuli No jerking or twitching noted.  Sensory: Movement to noxious as above.  Cerebellar/Gait: Unable to assess  ASSESSMENT/PLAN [name] is a 87 y.o. female with history of COPD, hypertension, hyperlipidemia who presented to the ED for1.5 weeksof shortness of breath and leg swelling. Due to increased urinary frequency at night, her PCP stopped her HCTZ about twoweeksprior to presentation. Since then she noticed increased ankleswelling and shortness of breath which is worse with exertion.  She was taken to the ED.  While in the hospital, she developed mild slurred speech and rhythmic movements of both arms, R>L. Additionally, she has  frequent episodes of confusions and disorientation. CT head was negative.  MRI brain revealed subcentimeter acute left frontal lobe white matter ischemic infarction and mild chronic small vessel disease.  She was placed on EEG monitoring.  She was intubate for airway protection and fluid overload.  Stroke:  left cerebral hemisphere acute small vessel white matter stroke   CT head No acute abnormality.     MRI  Small acute left frontal white matter infarct, mild chronic small vessel ischemic disease.   MRA head: negative for LVO, intracranial atherosclerosis, moderate stenosis of the Left Vertebrobasilar junction,  supraclinoid Right ICA.   Carotid Doppler right ICA 1-39% stenosis.  Left ICA 40-59% stenosis  2D Echo EF 65-70%  LDL 105  HgbA1c 5.9  VTE prophylaxis - Lovenox 40mg  daily  No antithrombotic prior to admission, now on aspirin 81 mg daily.   Therapy recommendations: PT CIR  Disposition: Pending  Concern for seizures  RN at [facility] noticed pt having tremor with acute onset of aphasia  EEG: moderate diffuse encephalopathy, nonspecific etiology. Patient was noted to have bilateral hand tremors during the study without concomitant EEG  change and were NOT epileptic. No seizures or definite epileptiform discharges were seen throughout the recording.  Hypertensive Urgency  Home meds:  Metoprolol 75mg  daily  SBP goal <150  Acute Respiratory Failure  Intubated on 3/13 for respiratory distress, flash pulmonary edema, and hypoxia  Frothy pink sputum noted on intubation  Received 60 mg IV lasix  Patient extubated this am  Hyperlipidemia  Home meds:  zocor 5mg  daily, resumed in hospital  LDL 105, goal < 70  Continue statin at discharge  Diabetes type II Controlled  Home meds:  none  HgbA1c 5.9, goal < 7.0  CBGs Recent Labs    10/24/20 0040 10/24/20 0342 10/24/20 0818  GLUCAP 92 128* 117*      SSI  Other Stroke Risk Factors  Advanced Age  >/= 46   Obesity, Body mass index is 41.95 kg/m., BMI >/= 30 associated with increased stroke risk, recommend weight loss, diet and exercise as appropriate   Coronary artery disease:    Congestive heart failure  Other Active Problems  COPD  Osteoarthritis   Hospital day # 3 I have personally obtained history,examined this patient, reviewed notes, independently viewed imaging studies, participated in medical decision making and plan of care.ROS completed by me personally and pertinent positives fully documented  I have made any additions or clarifications directly to the above note. Agree with note above. . Patient was admitted mostly for cardiorespiratory failure and MRI shows small left deep white matter infarct likely clinically silent.  She does have moderate stroke in the left carotid stenosis with unremarkable.  This is symptomatic medical therapy.  Recommend aspirin and aggressive risk factor modification.  Monitor respiratory cardiac failure as per CM team.  Discussed with Dr. Lynetta Mare and answered questions. This patient is critically ill and at significant risk of neurological worsening, death and care requires constant monitoring of vital signs, hemodynamics,respiratory and cardiac monitoring, extensive review of multiple databases, frequent neurological assessment, discussion with family, other specialists and medical decision making of high complexity.I have made any additions or clarifications directly to the above note.This critical care time does not reflect procedure time, or teaching time or supervisory time of PA/NP/Med Resident etc but could involve care discussion time.  I spent 30 minutes of neurocritical care time  in the care of  this patient.    Antony Contras, MD  Antony Contras, MD Medical Director [place] Pager: [phone] 10/24/2020 4:27 PM   To contact Stroke Continuity provider, please refer to [email or web address] After hours, contact General  Neurology

## 2020-10-24 NOTE — Progress Notes (Signed)
No bipap needed at this time. Pt resting comfortably with a RR 22 and Spo2 92 on 2L [place]

## 2020-10-24 NOTE — Progress Notes (Signed)
Physical Therapy Treatment Patient Details Name: [name] MRN: [id number] DOB: 02/10/1933 Today's Date: 10/24/2020    History of Present Illness 87 yo with COPD, Heart failure admitted to [facility] with volume overload. Intubated 3/13 - 14 for AMS, suspected pulm edema, possible sz and transferred to [facility] for further assessment and care. MRI + Small acute left frontal white matter infarct. EEG 3/14 - moderate diffuse encephalopathy.    PT Comments    Pt lethargic upon PT arrival to room, requesting a hamburger 2 hours s/p extubation. Pt requiring mod-max +2 assist for bed mobility and transfer to standing, pt limited in further mobility secondary to frequent eye closing during session. Pt denies double or blurry vision, PT suspects pt fatigue due to medication and recent extubation. PT continuing to recommend CIR post-acutely to address deficits, will continue to follow.     Follow Up Recommendations  CIR     Equipment Recommendations  Other (comment) (continuing to assess)    Recommendations for Other Services       Precautions / Restrictions Precautions Precautions: Fall Precaution Comments: jerking UE/LE Restrictions Weight Bearing Restrictions: No    Mobility  Bed Mobility Overal bed mobility: Needs Assistance Bed Mobility: Supine to Sit;Sit to Supine     Supine to sit: Max assist;+2 for physical assistance Sit to supine: Max assist;+2 for physical assistance   General bed mobility comments: max +2 for trunk and LE management, scooting to/from EOB, boost up in bed upon return to supine. VC for sequencing task.    Transfers Overall transfer level: Needs assistance   Transfers: Sit to/from Stand Sit to Stand: Mod assist;+2 safety/equipment         General transfer comment: Mod +2 for power up, rise, and steadying upon standing, pt with heavy posterior bias. Able to take x3 lateral steps towards [facility]  Ambulation/Gait             General Gait Details:  Nt-unable to safely attempt   Stairs             Wheelchair Mobility    Modified Rankin (Stroke Patients Only) Modified Rankin (Stroke Patients Only) Pre-Morbid Rankin Score: Slight disability Modified Rankin: Moderately severe disability     Balance Overall balance assessment: Needs assistance   Sitting balance-Leahy Scale: Poor       Standing balance-Leahy Scale: Poor                              Cognition Arousal/Alertness: Lethargic Behavior During Therapy: Restless Overall Cognitive Status: Impaired/Different from baseline Area of Impairment: Orientation;Attention;Memory;Following commands;Safety/judgement;Awareness;Problem solving                 Orientation Level: Disoriented to;Place;Situation;Time Current Attention Level: Focused Memory: Decreased short-term memory Following Commands: Follows one step commands inconsistently;Follows one step commands with increased time Safety/Judgement: Decreased awareness of deficits;Decreased awareness of safety Awareness: Intellectual Problem Solving: Slow processing;Decreased initiation;Difficulty sequencing;Requires verbal cues;Requires tactile cues General Comments: appears to be hallucinating; most likely delirius      Exercises      General Comments General comments (skin integrity, edema, etc.): Pt's niece Pam present for session      Pertinent Vitals/Pain Pain Assessment: Faces Faces Pain Scale: No hurt Pain Intervention(s): Limited activity within patient's tolerance;Monitored during session    [place] expects to be discharged to:: Private residence Living Arrangements: Alone Available Help at Discharge: Family;Available 24 hours/day Type of Home: House Home Access:  Level entry;Stairs to enter   Home Layout: One level Home Equipment: [place] - 2 wheels;Shower seat - built in;Grab bars - tub/shower      Prior Function Level of Independence: Independent with  assistive device(s)      Comments: drives; manages finances   PT Goals (current goals can now be found in the care plan section) Acute Rehab PT Goals Patient Stated Goal: to get better PT Goal Formulation: With patient/family Time For Goal Achievement: 11/06/20 Potential to Achieve Goals: Good Progress towards PT goals: Progressing toward goals    Frequency    Min 4X/week      PT Plan Current plan remains appropriate    Co-evaluation PT/OT/SLP Co-Evaluation/Treatment: Yes Reason for Co-Treatment: To address functional/ADL transfers;For patient/therapist safety PT goals addressed during session: Mobility/safety with mobility;Balance OT goals addressed during session: ADL's and self-care      AM-PAC PT "6 Clicks" Mobility   Outcome Measure  Help needed turning from your back to your side while in a flat bed without using bedrails?: A Lot Help needed moving from lying on your back to sitting on the side of a flat bed without using bedrails?: A Lot Help needed moving to and from a bed to a chair (including a wheelchair)?: A Lot Help needed standing up from a chair using your arms (e.g., wheelchair or bedside chair)?: A Lot Help needed to walk in hospital room?: Total Help needed climbing 3-5 steps with a railing? : Total 6 Click Score: 10    End of Session Equipment Utilized During Treatment: Gait belt Activity Tolerance: Patient tolerated treatment well Patient left: with call bell/phone within reach;with family/visitor present;in bed;with bed alarm set Nurse Communication: Mobility status PT Visit Diagnosis: Difficulty in walking, not elsewhere classified (R26.2);Muscle weakness (generalized) (M62.81);Other symptoms and signs involving the nervous system (R29.898)     Time: 0347-4259 PT Time Calculation (min) (ACUTE ONLY): 27 min  Charges:  $Therapeutic Activity: 8-22 mins                     Stacie Glaze, PT Acute Rehabilitation Services Pager [phone]  Office  [phone]    Roxine Caddy E Ruffin Pyo 10/24/2020, 3:10 PM

## 2020-10-24 NOTE — Procedures (Signed)
Extubation Procedure Note  Patient Details:   Name: [name] DOB: November 17, 1932 MRN: [id number]   Airway Documentation:    Vent end date: 10/24/20 Vent end time: 1041   Evaluation  O2 sats: stable throughout Complications: No apparent complications Patient did tolerate procedure well. Bilateral Breath Sounds: Diminished,Clear   Yes   Patient extubated per MD order. RT didn't hear cuff leak. MD okay to proceed with extubation. Vitals are stable on 3L [place]. Patient able to cough. RN and MD at bedside.   Herbie Saxon Runell Kovich 10/24/2020, 10:41 AM

## 2020-10-24 NOTE — Progress Notes (Signed)
[facility] Progress Note Patient Name: [name] DOB: January 13, 1933 MRN: [id number]   Date of Service  10/24/2020  HPI/Events of Note  89F who was [facility] with [facility] and subsequently found to have a small acute left frontal infarct. Plan had been to transfer her to [facility] for neuro consult for ? Seizure activity and further mgmt of her small frontal infarct. En route with [facility], the patient became increasingly hypertensive and hypoxemic requiring the transfer crew to stop at [facility] ED where the patient was subsequently intubated for acute hypoxemic respiratory failure. Frothy pink secretions from ETT were suggestive of flash pulmonary edema as etiology (consistent with acute hypertensive episode).  On arrival to [facility], patient was intubated and sedated. She is putting out good urine output (she received 60 mg IV Lasix at [facility] ED per my request). She is mechanically ventilated with SpO2 in high 90s.   eICU Interventions  # Neuro: - Small L frontal infarct + original ? of seizures: Neuro c/s. - Sedation with fentanyl + precedex  # Cardiac: - Keep SBP </= 150 and HR < 100 in setting of this recent hypertensive episode that resulted in flash pulmonary edema. - Gentle diuresis (already received Lasix). - Strict I/Os.  # Respiratory: - MV on current settings. - Anticipate possible extubation in AM.  Remainder of plan reviewed. For details see H&P note by Dr. Carson Myrtle.     Intervention Category Evaluation Type: New Patient Evaluation  [name] 10/24/2020, 12:48 AM

## 2020-10-24 NOTE — Progress Notes (Signed)
Carotid duplex has been completed.   Preliminary results in CV Proc.   Abram Sander 10/24/2020 2:26 PM

## 2020-10-24 NOTE — Progress Notes (Signed)
Attempted to call patient's sister, Dillon Bjork at [phone], with no answer.

## 2020-10-24 NOTE — Progress Notes (Signed)
EEG complete - results pending 

## 2020-10-24 NOTE — Progress Notes (Signed)
Spoke with patient's visitor, Hal Neer at bedside. [name] said [name]'s number in the chart is incorrect. [name] gave number for [facility] as [phone]. Will attempt to contact.

## 2020-10-24 NOTE — Progress Notes (Addendum)
NAME:  [name] MRN:  [id number] DOB:  [birth year] LOS: 3 ADMISSION DATE:  10/21/2020  CONSULTATION DATE:  10/23/2020 REFERRING MD:  Dr. Estill Cotta  REASON FOR CONSULTATION:  Acute hypoxemic respiratory failure   Initial Pulmonary/Critical Care Consultation  Brief History   87 yo with COPD, Heart failure admitted to [facility] with volume overload. Intubated 3/13 for AMS, suspected pulm edema, possible sz and transferred to [facility] for further assessment and care.  Past Medical/Surgical/Social/Family History   Past Medical History:  Diagnosis Date  . Colon polyp   . COPD (chronic obstructive pulmonary disease) (HCC)    MILD  . Hyperlipidemia   . Hypertension   . OA (osteoarthritis)    OF THE KNEES  . Osteopenia   . Shingles   . Thyroid cyst     Past Surgical History:  Procedure Laterality Date  . BREAST EXCISIONAL BIOPSY Right    benign    Social History   Tobacco Use  . Smoking status: Former Research scientist (life sciences)  . Smokeless tobacco: Never Used  Substance Use Topics  . Alcohol use: Never    Family History  Problem Relation Age of Onset  . CAD Father   . Breast cancer Neg Hx      Significant Hospital Events   3/11: admitted to [facility] 3/13: intubated during transfer to [facility] 3/14 plan to extubate    Consults:  Neurology PCCM  Procedures:  3/13: intubated by EMS (in [facility] ER).   Significant Diagnostic Tests:  MRI (3/13) > no large vessel occlusion. Small L frontal white matter infarct   Micro Data:   Results for orders placed or performed during the hospital encounter of 10/21/20  Resp Panel by RT-PCR (Flu A&B, Covid) Nasopharyngeal Swab     Status: None   Collection Time: 10/21/20  9:44 AM   Specimen: Nasopharyngeal Swab; Nasopharyngeal(NP) swabs in vial transport medium  Result Value Ref Range Status   SARS Coronavirus 2 by RT PCR NEGATIVE NEGATIVE Final    Comment: (NOTE) SARS-CoV-2 target nucleic acids are NOT DETECTED.  The SARS-CoV-2 RNA is  generally detectable in upper respiratory specimens during the acute phase of infection. The lowest concentration of SARS-CoV-2 viral copies this assay can detect is 138 copies/mL. A negative result does not preclude SARS-Cov-2 infection and should not be used as the sole basis for treatment or other patient management decisions. A negative result may occur with  improper specimen collection/handling, submission of specimen other than nasopharyngeal swab, presence of viral mutation(s) within the areas targeted by this assay, and inadequate number of viral copies(<138 copies/mL). A negative result must be combined with clinical observations, patient history, and epidemiological information. The expected result is Negative.  Fact Sheet for Patients:  EntrepreneurPulse.com.au  Fact Sheet for Healthcare Providers:  IncredibleEmployment.be  This test is no t yet approved or cleared by the [place] FDA and  has been authorized for detection and/or diagnosis of SARS-CoV-2 by FDA under an Emergency Use Authorization (EUA). This EUA will remain  in effect (meaning this test can be used) for the duration of the COVID-19 declaration under Section 564(b)(1) of the Act, 21 U.S.C.section 360bbb-3(b)(1), unless the authorization is terminated  or revoked sooner.       Influenza A by PCR NEGATIVE NEGATIVE Final   Influenza B by PCR NEGATIVE NEGATIVE Final    Comment: (NOTE) The Xpert Xpress SARS-CoV-2/FLU/RSV plus assay is intended as an aid in the diagnosis of influenza from Nasopharyngeal swab specimens and  should not be used as a sole basis for treatment. Nasal washings and aspirates are unacceptable for Xpert Xpress SARS-CoV-2/FLU/RSV testing.  Fact Sheet for Patients: EntrepreneurPulse.com.au  Fact Sheet for Healthcare Providers: IncredibleEmployment.be  This test is not yet approved or cleared by the [place] FDA and has been authorized for detection and/or diagnosis of SARS-CoV-2 by FDA under an Emergency Use Authorization (EUA). This EUA will remain in effect (meaning this test can be used) for the duration of the COVID-19 declaration under Section 564(b)(1) of the Act, 21 U.S.C. section 360bbb-3(b)(1), unless the authorization is terminated or revoked.  Performed at [facility], [place] [address], [place], [place] [address]   MRSA PCR Screening     Status: None   Collection Time: 10/23/20 11:00 PM   Specimen: Nasal Mucosa; Nasopharyngeal  Result Value Ref Range Status   MRSA by PCR NEGATIVE NEGATIVE Final    Comment:        The GeneXpert MRSA Assay (FDA approved for NASAL specimens only), is one component of a comprehensive MRSA colonization surveillance program. It is not intended to diagnose MRSA infection nor to guide or monitor treatment for MRSA infections. Performed at [facility], [place] [address], [place],  [address]       Antimicrobials:    Interim history/subjective:  On 25 mcg fent and 0.04 precedex. Wakes up and follows commands.  Put on PSV   Objective   BP (!) 147/75   Pulse 63   Temp (!) 96.98 F (36.1 C)   Resp 16   Ht 4' 11.75" (1.518 m)   Wt 96.6 kg   SpO2 100%   BMI 41.95 kg/m     Filed Weights   10/21/20 0832 10/23/20 0404  Weight: 94.3 kg 96.6 kg    Intake/Output Summary (Last 24 hours) at 10/24/2020 0717 Last data filed at 10/24/2020 0700 Gross per 24 hour  Intake 907.59 ml  Output 3150 ml  Net -2242.41 ml   Vent Mode: PRVC FiO2 (%):  [40 %-50 %] 40 % Set Rate:  [16 bmp-20 bmp] 16 bmp Vt Set:  [350 mL] 350 mL PEEP:  [7 cmH20] 7 cmH20 Plateau Pressure:  [17 cmH20-18 cmH20] 17 cmH20    Examination: General- chronically and critically ill appearing elderly F, intubated lightly sedated NAD Neuro- sedated, awakens to voice, following commands, PERRL HEENT-NCAT. ETT secure. Anicteric  sclera Pulm- Symmetrical chest expansion, even and unlabored on PSV. No wheezes, no crackles. Diminished bibasilar sounds  CV- RRR s1s2 cap refill < 3sec 2+ peripheral pulses GI- Soft round ndnt + bowel sounds  GU-foley with clear yellow urine  MSK- no acute joint deformity, no cyanosis or clubbing  Skin -c/d/w no rash   Resolved Hospital Problem list   Asterixis   Assessment & Plan:   ASSESSMENT/PLAN:   Acute respiratory failure with hypercarbia, hypoxia, requiring intubation Hx COPD -ABG suggestive of chronic hypercarbia. COPD +/- Possible OSA/ OHS? -possible component of pulm edema initially though this seems to have improved clinically -no clinical features of AECOPD  P -extubate -Can consider PRN/noct BiPAP -IS, mobility  -pulm hygiene  -PRN albuterol  -bedside swallow 3/14 PM   Acute encephalopathy, suspect metabolic -no LVO on MRI.  -EEG without evidence of seizure Asterixis, improved  -suspect this was r/t worse hypercarbia  P -will dc sedating meds when extubated -delirium precautions -metabolic panel is pending to assess for possibly contributing derangements   HTN HLD P -ICU monitoring -will assess after  precedex, fent dc and add IV as needed  -will add home meds (statin and metop) when swallow study passed  Hyperglycemia -SSI   L/T/D -ETT: plan to extubate 3/14 -Foley: dc 3/14 -PIVs: continues  Best practice:  Diet: NPO Pain/Anxiety/Delirium protocol (if indicated): -- VAP protocol (if indicated): -- DVT prophylaxis: Lovenox GI prophylaxis: -- Glucose control: N/A Mobility/Activity: PT/OT  CODE STATUS: LIMITED CODE (no intubation), however patient would accept elective intubation in non-code situation.  Family Communication:   Patient contact listed as sister Dillon Bjork [phone]) has an incorrect phone number listed. -- updates provided to sister 3/14 (on phone), and niece Pam 3/14 (at bedside) Disposition: ICU    Labs    CBC: Recent Labs  Lab 10/21/20 0944 10/22/20 0436 10/23/20 0435 10/24/20 0015  WBC 7.9 7.8 9.2  --   HGB 13.8 13.8 14.0 14.6  HCT 45.3 47.9* 47.4* 43.0  MCV 99.6 102.4* 102.8*  --   PLT 153 163 163  --     Basic Metabolic Panel: Recent Labs  Lab 10/21/20 0944 10/22/20 0436 10/22/20 2310 10/23/20 0435 10/24/20 0015  NA 140 142 140 139 137  K 4.7 4.9 5.0 4.8 4.1  CL 99 97* 94* 92*  --   CO2 33* 34* 33* 36*  --   GLUCOSE 105* 136* 136* 132*  --   BUN 17 25* 27* 29*  --   CREATININE 0.98 1.04* 1.10* 1.02*  --   CALCIUM 9.0 8.9 8.8* 8.9  --   MG  --   --  2.3  --   --    GFR: Estimated Creatinine Clearance: 41.8 mL/min (A) (by C-G formula based on SCr of 1.02 mg/dL (H)). Recent Labs  Lab 10/21/20 0944 10/22/20 0436 10/23/20 0435  WBC 7.9 7.8 9.2    Liver Function Tests: Recent Labs  Lab 10/22/20 2310  AST 21  ALT 26  ALKPHOS 52  BILITOT 0.6  PROT 6.8  ALBUMIN 3.6   No results for input(s): LIPASE, AMYLASE in the last 168 hours. Recent Labs  Lab 10/23/20 1143  AMMONIA 26    ABG    Component Value Date/Time   PHART 7.489 (H) 10/24/2020 0015   PCO2ART 64.8 (H) 10/24/2020 0015   PO2ART 100 10/24/2020 0015   HCO3 49.0 (H) 10/24/2020 0015   TCO2 >50 (H) 10/24/2020 0015   O2SAT 98.0 10/24/2020 0015     Coagulation Profile: No results for input(s): INR, PROTIME in the last 168 hours.  Cardiac Enzymes: No results for input(s): CKTOTAL, CKMB, CKMBINDEX, TROPONINI in the last 168 hours.  HbA1C: No results found for: HGBA1C  CBG: Recent Labs  Lab 10/22/20 0356 10/22/20 2127 10/23/20 1039 10/24/20 0040 10/24/20 0342  GLUCAP 131* 136* 139* 92 128*    CRITICAL CARE Performed by: Cristal Generous   Total critical care time: 44 minutes  Critical care time was exclusive of separately billable procedures and treating other patients. Critical care was necessary to treat or prevent imminent or life-threatening deterioration.  Critical care was  time spent personally by me on the following activities: development of treatment plan with patient and/or surrogate as well as nursing, discussions with consultants, evaluation of patient's response to treatment, examination of patient, obtaining history from patient or surrogate, ordering and performing treatments and interventions, ordering and review of laboratory studies, ordering and review of radiographic studies, pulse oximetry and re-evaluation of patient's condition.  Eliseo Gum MSN, AGACNP-BC [place] [phone] If no answer, [phone] 10/24/2020,  11:04 AM

## 2020-10-24 NOTE — Progress Notes (Signed)
Updated E-Link about patient's sister's updated phone number and conversation with sister.

## 2020-10-24 NOTE — Procedures (Signed)
Patient Name: [name]  MRN: [id number]  Epilepsy Attending: Lora Havens  Referring Physician/Provider: Dr. Kerney Elbe Date: 10/24/2020  Duration: 35.44 mins  Patient history: 87 year old female with altered mental status and slurred speech with rhythmic movements of both arms.  EEG to evaluate for seizures.  Level of alertness: Awake  AEDs during EEG study: None  Technical aspects: This EEG study was done with scalp electrodes positioned according to the 10-20 International system of electrode placement. Electrical activity was acquired at a sampling rate of 500Hz  and reviewed with a high frequency filter of 70Hz  and a low frequency filter of 1Hz . EEG data were recorded continuously and digitally stored.   Description: No clear posterior dominant rhythm was seen.  EEG showed continuous generalized polymorphic mixed frequencies with predominantly 3 to 6 Hz theta-delta slowing.  Patient was also noted to have bilateral hand tremors during the study. Concomitant EEG before, during and after the event did not show any EEG change to suggest seizure.  Hyperventilation and photic stimulation were not performed.     ABNORMALITY -Continuous slow, generalized  IMPRESSION: This study is suggestive of moderate diffuse encephalopathy, nonspecific etiology.  Patient was noted to have bilateral hand tremors during the study without concomitant EEG change and were NOT epileptic. No seizures or definite epileptiform discharges were seen throughout the recording.  [name]

## 2020-10-24 NOTE — Progress Notes (Signed)
Delay in collecting initial ABG due to vent changes made after pt was settled in ICU. Vt decreased from 449mL (greater than 10cc's) to 32mL (8cc's) per order. RT will continue to monitor and adjust vent settings accordingly.

## 2020-10-25 LAB — BASIC METABOLIC PANEL
Anion gap: 7 (ref 5–15)
BUN: 21 mg/dL (ref 8–23)
CO2: 41 mmol/L — ABNORMAL HIGH (ref 22–32)
Calcium: 8.7 mg/dL — ABNORMAL LOW (ref 8.9–10.3)
Chloride: 88 mmol/L — ABNORMAL LOW (ref 98–111)
Creatinine, Ser: 0.92 mg/dL (ref 0.44–1.00)
GFR, Estimated: >60 mL/min (ref >60)
Glucose, Bld: 119 mg/dL — ABNORMAL HIGH (ref 70–99)
Potassium: 3.7 mmol/L (ref 3.5–5.1)
Sodium: 136 mmol/L (ref 135–145)

## 2020-10-25 LAB — CBC
HCT: 44 % (ref 36.0–46.0)
Hemoglobin: 13.6 g/dL (ref 12.0–15.0)
MCH: 29.8 pg (ref 26.0–34.0)
MCHC: 30.9 g/dL (ref 30.0–36.0)
MCV: 96.3 fL (ref 80.0–100.0)
Platelets: 140 10*3/uL — ABNORMAL LOW (ref 150–400)
RBC: 4.57 MIL/uL (ref 3.87–5.11)
RDW: 12.7 % (ref 11.5–15.5)
WBC: 10.1 10*3/uL (ref 4.0–10.5)
nRBC: 0 % (ref 0.0–0.2)

## 2020-10-25 LAB — GLUCOSE, CAPILLARY
Glucose-Capillary: 112 mg/dL — ABNORMAL HIGH (ref 70–99)
Glucose-Capillary: 152 mg/dL — ABNORMAL HIGH (ref 70–99)
Glucose-Capillary: 150 mg/dL — ABNORMAL HIGH (ref 70–99)
Glucose-Capillary: 120 mg/dL — ABNORMAL HIGH (ref 70–99)

## 2020-10-25 NOTE — Consult Note (Signed)
Physical Medicine and Rehabilitation Consult   Reason for Consult:  Functional deficits due to encephalopathy/tremors.  Referring Physician: Dr. Wyline Copas   HPI: [name] is a 87 y.o. female with history of COPD, HTN, chronic LBP --occasional ESI/Dr. Nelva Bush, OA who was admitted on 10/21/20 with progressive SOB and LE edema that started after her PCP took her off her diuretic. She was hypoxic at admission and started on IV diuresis as well as supplemental oxygen. She developed lethargy with head tremors and confusion while being evaluated in ED and stat CT head negative for acute changes. MRI/MRA  brain ordered for work up and revealed small acute left frontal white matter infarct with widespread intracranial atherosclerosis and moderate stenosis of L-VB junction and supraclinoid R-ICA. 2D echo showed EF 65-70% with mild concentric LVH and evidence of PAH.  She was transferred to [facility] hospital for work up and intubated/sedated prior to transport.  EEG ordered due to concerns of seizure and showed moderate diffuse encephalopathy--bilateral hand tremors noted without concomitant EEG changes and not felt to be epileptic. She tolerated extubated 03/14 without difficulty and Dr. Leonie Man recommended ASA for "silent small vessel stroke". Therapy evaluations completed and limited by BUE/BLE tremors, with strong posterior bias, lethargy and cognitive deficits with difficulty following one step commands. CIR recommended due to functional decline.   Lives alone--has sister "[name]" who lives in [place] and is 85 years old.  She used to work for [employer] then [employer] admission. Has prior coworker/friends and church members who are like family in town. Per friend in the room--has been getting short of breath/winded with activities in home--has walkers that she leans on. She also has a fall 3-4 weeks ago and had started at looking into moving to [facility].    Decided to not go to assisted living due to cost.  LBM 2 days  ago; doesn't live on O2 at home.  Came in due to swelling and SOB- pt doesn't remember CVA or intubation/extubation; also reports poor memory  Review of Systems  Constitutional: Negative for chills and fever.  HENT: Negative for hearing loss and tinnitus.   Eyes: Negative for blurred vision and double vision.  Respiratory: Negative for cough and hemoptysis.   Cardiovascular: Negative for chest pain and leg swelling.  Gastrointestinal: Negative for abdominal pain, constipation, heartburn and nausea.  Genitourinary: Positive for frequency. Negative for dysuria.  Musculoskeletal: Positive for back pain.  Skin: Negative for itching and rash.  Neurological: Positive for weakness. Negative for dizziness, tremors and headaches.  Psychiatric/Behavioral: Positive for memory loss.  All other systems reviewed and are negative.     Past Medical History:  Diagnosis Date  . Colon polyp   . COPD (chronic obstructive pulmonary disease) (HCC)    MILD  . Hyperlipidemia   . Hypertension   . OA (osteoarthritis)    OF THE KNEES  . Osteopenia   . Shingles   . Thyroid cyst     Past Surgical History:  Procedure Laterality Date  . BREAST EXCISIONAL BIOPSY Right    benign    Family History  Problem Relation Age of Onset  . CAD Father   . Breast cancer Neg Hx     Social History:  Lives alone and was independent PTA and active prior to Covid.  She  reports that she has quit smoking years ago.  She has never used smokeless tobacco. She reports that she does not drink alcohol and does not use drugs.  Allergies  Allergen Reactions  . Ceftin [Cefuroxime] Diarrhea  . Clindamycin/Lincomycin Itching  . Simvastatin     Muscle pain with large dose  . Doxycycline Hyclate Diarrhea and Rash  . Penicillins Rash    Medications Prior to Admission  Medication Sig Dispense Refill  . calcium carbonate (OSCAL) 1500 (600 Ca) MG TABS tablet Take 600 mg of elemental calcium by mouth 2 (two) times daily  with a meal.    . celecoxib (CELEBREX) 100 MG capsule Take 100 mg by mouth 2 (two) times daily as needed for mild pain.    . Cholecalciferol (VITAMIN D PO) Take 1,000 Units by mouth daily.    . metoprolol succinate (TOPROL-XL) 50 MG 24 hr tablet Take 75 mg by mouth daily. Taking 1 &1/2 tablet = 75mg  daily    . simvastatin (ZOCOR) 5 MG tablet Take 5 mg by mouth daily.      Home: Home Living Family/patient expects to be discharged to:: Private residence Living Arrangements: Alone Available Help at Discharge: Family,Available 24 hours/day Type of Home: House Home Access: Level entry,Stairs to enter Entrance Stairs-Number of Steps: 2 steps from garage [place]: One level Bathroom Shower/Tub: Multimedia programmer: Standard Bathroom Accessibility: Yes Home Equipment: Environmental consultant - 2 wheels,Shower seat - built in,Grab bars - tub/shower  Functional History: Prior Function Level of Independence: Independent with assistive device(s) Comments: drives; manages finances Functional Status:  Mobility: Bed Mobility Overal bed mobility: Needs Assistance Bed Mobility: Supine to Sit,Sit to Supine Supine to sit: Max assist,+2 for physical assistance Sit to supine: Max assist,+2 for physical assistance General bed mobility comments: max +2 for trunk and LE management, scooting to/from EOB, boost up in bed upon return to supine. VC for sequencing task. Transfers Overall transfer level: Needs assistance Equipment used: Rolling walker (2 wheeled) Transfers: Sit to/from Stand Sit to Stand: Mod assist,+2 safety/equipment Stand pivot transfers: Mod assist General transfer comment: Mod +2 for power up, rise, and steadying upon standing, pt with heavy posterior bias. Able to take x3 lateral steps towards [facility] Ambulation/Gait General Gait Details: Nt-unable to safely attempt    ADL:    Cognition: Cognition Overall Cognitive Status: Impaired/Different from baseline Orientation Level:  Oriented to person,Oriented to time,Disoriented to place,Disoriented to situation Cognition Arousal/Alertness: Lethargic Behavior During Therapy: Restless Overall Cognitive Status: Impaired/Different from baseline Area of Impairment: Orientation,Attention,Memory,Following commands,Safety/judgement,Awareness,Problem solving Orientation Level: Disoriented to,Place,Situation,Time Current Attention Level: Focused Memory: Decreased short-term memory Following Commands: Follows one step commands inconsistently,Follows one step commands with increased time Safety/Judgement: Decreased awareness of deficits,Decreased awareness of safety Awareness: Intellectual Problem Solving: Slow processing,Decreased initiation,Difficulty sequencing,Requires verbal cues,Requires tactile cues General Comments: appears to be hallucinating; most likely delirius   Blood pressure (!) 150/75, pulse (!) 106, temperature 98.1 F (36.7 C), temperature source Oral, resp. rate 20, height 4' 11.75" (1.518 m), weight 96.2 kg, SpO2 (!) 81 %. Physical Exam Vitals and nursing note reviewed. Exam conducted with a chaperone present.  Constitutional:      Appearance: Normal appearance. She is obese.     Comments: Audible wheezing at times with productive cough.  O2 sats 92% on 2L-[place]- "Daughter" at bedside- chief compliance officer of her company-says pt not eating a lot; sitting up in bedside chair, NAD  HENT:     Head: Normocephalic.     Comments: Smile equal; tongue midline; O2 by [place] 2L    Right Ear: External ear normal.     Left Ear: External ear normal.     Nose: Congestion  present.     Mouth/Throat:     Mouth: Mucous membranes are dry.     Pharynx: Oropharynx is clear. No oropharyngeal exudate.  Eyes:     General:        Right eye: No discharge.        Left eye: No discharge.     Extraocular Movements: Extraocular movements intact.     Conjunctiva/sclera: Conjunctivae normal.  Cardiovascular:     Comments: RRR- no  JVD Pulmonary:     Breath sounds: Wheezing present.     Comments: Wheezing in L lower lobe; decreased at bases B/L; adequate air movement in upper lobes, no R/R Abdominal:     Comments: Soft, NT, ND, (+)BS    Musculoskeletal:        General: No swelling.     Cervical back: Normal range of motion and neck supple.     Comments: 5/5 in UEs B/L - except finger abd 5-/5 B/L LEs- HF 5-/5 B/L; otherwise 5/5 B/L   Skin:    Comments: Trace edema to ankles B/L- B/L  Neurological:     Mental Status: She is alert.     Comments: Confused and tangential needing redirection to answer basic questions. Had to redirect but able to answer simple motor commands with cues. Moves all four--no tremors noted Knew came into hospital Friday, that's in [facility]- knew 3/15 with a lot of cues- knew Easter next holiday; but word searching a LOT- couldn't think of names of friends- did with cues- aphasia? Anomia?  Psychiatric:     Comments: A little frustrated with poor memory- appropriate however     Results for orders placed or performed during the hospital encounter of 10/21/20 (from the past 24 hour(s))  Glucose, capillary     Status: Abnormal   Collection Time: 10/24/20 11:29 AM  Result Value Ref Range   Glucose-Capillary 118 (H) 70 - 99 mg/dL  Glucose, capillary     Status: Abnormal   Collection Time: 10/24/20  3:58 PM  Result Value Ref Range   Glucose-Capillary 123 (H) 70 - 99 mg/dL  Glucose, capillary     Status: Abnormal   Collection Time: 10/24/20 10:10 PM  Result Value Ref Range   Glucose-Capillary 132 (H) 70 - 99 mg/dL  Basic metabolic panel     Status: Abnormal   Collection Time: 10/25/20  1:21 AM  Result Value Ref Range   Sodium 136 135 - 145 mmol/L   Potassium 3.7 3.5 - 5.1 mmol/L   Chloride 88 (L) 98 - 111 mmol/L   CO2 41 (H) 22 - 32 mmol/L   Glucose, Bld 119 (H) 70 - 99 mg/dL   BUN 21 8 - 23 mg/dL   Creatinine, Ser 0.92 0.44 - 1.00 mg/dL   Calcium 8.7 (L) 8.9 - 10.3 mg/dL    GFR, Estimated >60 >60 mL/min   Anion gap 7 5 - 15  CBC     Status: Abnormal   Collection Time: 10/25/20  1:21 AM  Result Value Ref Range   WBC 10.1 4.0 - 10.5 K/uL   RBC 4.57 3.87 - 5.11 MIL/uL   Hemoglobin 13.6 12.0 - 15.0 g/dL   HCT 44.0 36.0 - 46.0 %   MCV 96.3 80.0 - 100.0 fL   MCH 29.8 26.0 - 34.0 pg   MCHC 30.9 30.0 - 36.0 g/dL   RDW 12.7 11.5 - 15.5 %   Platelets 140 (L) 150 - 400 K/uL   nRBC 0.0 0.0 -  0.2 %  Glucose, capillary     Status: Abnormal   Collection Time: 10/25/20  7:25 AM  Result Value Ref Range   Glucose-Capillary 112 (H) 70 - 99 mg/dL   CT HEAD WO CONTRAST  Result Date: 10/23/2020 CLINICAL DATA:  87 year old female with unexplained altered mental status, confusion. EXAM: CT HEAD WITHOUT CONTRAST TECHNIQUE: Contiguous axial images were obtained from the base of the skull through the vertex without intravenous contrast. COMPARISON:  None. FINDINGS: Study is intermittently degraded by motion artifact despite repeated imaging attempts. Brain: Cerebral volume is within normal limits for age. No ventriculomegaly. No midline shift, mass effect, or evidence of intracranial mass lesion. No acute intracranial hemorrhage identified. No cortically based acute infarct identified. No definite cortical encephalomalacia. Allowing for intermittent motion gray-white matter differentiation appears fairly normal for age throughout the brain. Vascular: Calcified atherosclerosis at the skull base. No suspicious intracranial vascular hyperdensity. Skull: Intermittent motion. No acute osseous abnormality identified. Sinuses/Orbits: Visualized paranasal sinuses and mastoids are clear. Other: No acute orbit or scalp soft tissue finding. IMPRESSION: Negative for age noncontrast CT appearance of the brain when allowing for motion degradation despite repeated imaging attempts. Electronically Signed   By: Genevie Ann M.D.   On: 10/23/2020 10:23   MR ANGIO HEAD WO CONTRAST  Result Date:  10/24/2020 CLINICAL DATA:  87 year old female with small acute left centrum semiovale white matter infarct on MRI yesterday. EXAM: MRA HEAD WITHOUT CONTRAST TECHNIQUE: Angiographic images of the Circle of Willis were obtained using MRA technique without intravenous contrast. COMPARISON:  Brain MRI 10/23/2020. FINDINGS: No intracranial mass effect or ventriculomegaly. Antegrade flow in the posterior circulation with dominant distal right vertebral artery. No right vertebral or vertebrobasilar junction stenosis. Non dominant left vertebral artery appears more irregular, a especially near the vertebrobasilar junction with up to moderate stenosis on series 1037, image 10. Normal PICA origins. Patent basilar artery with tortuosity and mild irregularity. No significant basilar stenosis. SCA and right PCA origins are within normal limits. Fetal type left PCA origin with small left P1. Right posterior communicating artery is diminutive or absent. Mild bilateral PCA irregularity. Mildly tortuous left PCA P2 segment. Antegrade flow in both ICA siphons. Evidence of bilateral siphon calcified plaque. Tortuous cavernous segments. No significant left siphon stenosis. Mild to moderate supraclinoid right ICA stenosis. Normal ophthalmic and left posterior communicating artery origins. Patent carotid termini. Patent MCA and ACA origins. Dominant left ACA A1 segment. Anterior communicating artery and visible ACA branches are within normal limits. MCA M1 segments and bifurcations are patent without stenosis. Tortuous right M1. Visible bilateral MCA branches are within normal limits. IMPRESSION: 1. Negative for large vessel occlusion, positive for widespread intracranial atherosclerosis. 2. Up to moderate stenosis of the Left Vertebrobasilar junction, supraclinoid Right ICA. Electronically Signed   By: Genevie Ann M.D.   On: 10/24/2020 07:02   MR BRAIN WO CONTRAST  Result Date: 10/23/2020 CLINICAL DATA:  Acute confusion with tremors.  EXAM: MRI HEAD WITHOUT CONTRAST TECHNIQUE: Multiplanar, multiecho pulse sequences of the brain and surrounding structures were obtained without intravenous contrast. COMPARISON:  Head CT 10/23/2020 FINDINGS: Multiple sequences are moderately motion degraded. Brain: There is a subcentimeter acute infarct in the posterior left frontal white matter at the level of the centrum semiovale. No intracranial hemorrhage, mass, midline shift, or extra-axial fluid collection is identified. T2 hyperintensities in the cerebral white matter and pons are nonspecific but compatible with mild chronic small vessel ischemic disease. The ventricles and sulci are within normal limits  for age. Vascular: Major intracranial vascular flow voids are preserved. Skull and upper cervical spine: Unremarkable bone marrow signal. Sinuses/Orbits: Bilateral cataract extraction. Bilateral proptosis. Paranasal sinuses and mastoid air cells are clear. Other: None. IMPRESSION: 1. Small acute left frontal white matter infarct. 2. Mild chronic small vessel ischemic disease. Electronically Signed   By: Logan Bores M.D.   On: 10/23/2020 15:07   DG Chest Port 1 View  Result Date: 10/23/2020 CLINICAL DATA:  Intubated, CVA EXAM: PORTABLE CHEST 1 VIEW COMPARISON:  10/23/2020 FINDINGS: Single frontal view of the chest demonstrates endotracheal tube overlying tracheal air column, tip approximately 2 cm above carina. Cardiac silhouette is unremarkable. Patchy consolidation at the left lung base unchanged, likely atelectasis. No effusion or pneumothorax. No acute bony abnormalities. IMPRESSION: 1. Minimal left basilar consolidation, favor atelectasis, stable. 2. Unremarkable endotracheal tube. Electronically Signed   By: Randa Ngo M.D.   On: 10/23/2020 23:09   DG Chest Port 1 View  Result Date: 10/23/2020 CLINICAL DATA:  Post intubation EXAM: PORTABLE CHEST 1 VIEW COMPARISON:  10/21/2020 FINDINGS: Interval intubation, tip of the endotracheal tube about  1.7 cm superior to the carina. Patchy airspace disease at the left base. No pleural effusion. Normal heart size. Aortic atherosclerosis. No pneumothorax. IMPRESSION: Endotracheal tube tip about 1.7 cm superior to the carina. Patchy airspace disease at the left base, atelectasis versus minimal pneumonia versus aspiration Electronically Signed   By: Donavan Foil M.D.   On: 10/23/2020 22:08   EEG adult  Result Date: 10/24/2020 Lora Havens, MD     10/24/2020 10:47 AM Patient Name: CLISTA RAINFORD MRN: [id number] Epilepsy Attending: Lora Havens Referring Physician/Provider: Dr. Kerney Elbe Date: 10/24/2020 Duration: 35.44 mins Patient history: 87 year old female with altered mental status and slurred speech with rhythmic movements of both arms.  EEG to evaluate for seizures. Level of alertness: Awake AEDs during EEG study: None Technical aspects: This EEG study was done with scalp electrodes positioned according to the 10-20 International system of electrode placement. Electrical activity was acquired at a sampling rate of 500Hz  and reviewed with a high frequency filter of 70Hz  and a low frequency filter of 1Hz . EEG data were recorded continuously and digitally stored. Description: No clear posterior dominant rhythm was seen.  EEG showed continuous generalized polymorphic mixed frequencies with predominantly 3 to 6 Hz theta-delta slowing.  Patient was also noted to have bilateral hand tremors during the study. Concomitant EEG before, during and after the event did not show any EEG change to suggest seizure.  Hyperventilation and photic stimulation were not performed.   ABNORMALITY -Continuous slow, generalized IMPRESSION: This study is suggestive of moderate diffuse encephalopathy, nonspecific etiology.  Patient was noted to have bilateral hand tremors during the study without concomitant EEG change and were NOT epileptic. No seizures or definite epileptiform discharges were seen throughout the recording.  Priyanka O Yadav   VAS US CAROTID  Result Date: 10/24/2020 Carotid Arterial Duplex Study Indications:       CVA. Risk Factors:      Hypertension, hyperlipidemia. Limitations        Today's exam was limited due to the patient's inability or                    unwillingness to cooperate and the patient's respiratory                    variation. Comparison Study:  no prior Performing Technologist: Abram Sander RVS  Examination  Guidelines: A complete evaluation includes B-mode imaging, spectral Doppler, color Doppler, and power Doppler as needed of all accessible portions of each vessel. Bilateral testing is considered an integral part of a complete examination. Limited examinations for reoccurring indications may be performed as noted.  Right Carotid Findings: +----------+--------+--------+--------+-------------------+--------------------+           PSV cm/sEDV cm/sStenosisPlaque Description Comments             +----------+--------+--------+--------+-------------------+--------------------+ CCA Prox  114     24              heterogenous                            +----------+--------+--------+--------+-------------------+--------------------+ CCA Distal108     23              heterogenous and                                                          calcific                                +----------+--------+--------+--------+-------------------+--------------------+ ICA Prox  104     24      1-39%                      limited                                                                   visualization due to                                                      patient movement     +----------+--------+--------+--------+-------------------+--------------------+ ICA Distal                                           Not visualized       +----------+--------+--------+--------+-------------------+--------------------+ ECA       110                                                              +----------+--------+--------+--------+-------------------+--------------------+ +----------+--------+-------+--------+-------------------+           PSV cm/sEDV cmsDescribeArm Pressure (mmHG) +----------+--------+-------+--------+-------------------+ [id number]                                        +----------+--------+-------+--------+-------------------+ +---------+--------+---+--------+--+---------+ VertebralPSV cm/s130EDV cm/s35Antegrade +---------+--------+---+--------+--+---------+  Left Carotid Findings: +----------+--------+--------+--------+-------------------+--------------------+  PSV cm/sEDV cm/sStenosisPlaque Description Comments             +----------+--------+--------+--------+-------------------+--------------------+ CCA Prox  109     31              heterogenous                            +----------+--------+--------+--------+-------------------+--------------------+ CCA Distal89      26              heterogenous and                                                          calcific                                +----------+--------+--------+--------+-------------------+--------------------+ ICA Prox  170     56      40-59%  heterogenous and   Velocities may                                         calcific           underestimate degree                                                      of stenosis due to                                                        more proximal                                                             obstruction.         +----------+--------+--------+--------+-------------------+--------------------+ ICA Mid   82      27                                                      +----------+--------+--------+--------+-------------------+--------------------+ ICA Distal78      31                                                       +----------+--------+--------+--------+-------------------+--------------------+ ECA       113                                                             +----------+--------+--------+--------+-------------------+--------------------+ +----------+--------+--------+--------+-------------------+  PSV cm/sEDV cm/sDescribeArm Pressure (mmHG) +----------+--------+--------+--------+-------------------+ [id number]                                         +----------+--------+--------+--------+-------------------+ +---------+--------+--+--------+--+---------+ VertebralPSV cm/s84EDV cm/s20Antegrade +---------+--------+--+--------+--+---------+   Summary: Right Carotid: Velocities in the right ICA are consistent with a 1-39% stenosis. Left Carotid: Velocities in the left ICA are consistent with a 40-59% stenosis. Vertebrals: Bilateral vertebral arteries demonstrate antegrade flow. *See table(s) above for measurements and observations.     Preliminary      Assessment/Plan: Diagnosis: L frontal white matter stroke aND ENCEPHALOPATHY  1. Does the need for close, 24 hr/day medical supervision in concert with the patient's rehab needs make it unreasonable for this patient to be served in a less intensive setting? Yes 2. Co-Morbidities requiring supervision/potential complications: LE edema- improved, DOE/SOB, encephalopathy; HTN, chronic LBP- COPD 3. Due to bladder management, bowel management, safety, skin/wound care, disease management, medication administration, pain management and patient education, does the patient require 24 hr/day rehab nursing? Yes 4. Does the patient require coordinated care of a physician, rehab nurse, therapy disciplines of PT< OT and SLP to address physical and functional deficits in the context of the above medical diagnosis(es)? Yes Addressing deficits in the following areas: balance, endurance, locomotion, strength, transferring, bowel/bladder  control, bathing, dressing, feeding, grooming, toileting, cognition, language and psychosocial support 5. Can the patient actively participate in an intensive therapy program of at least 3 hrs of therapy per day at least 5 days per week? Yes 6. The potential for patient to make measurable gains while on inpatient rehab is good 7. Anticipated functional outcomes upon discharge from inpatient rehab are supervision and min assist  with PT, supervision and min assist with OT, supervision with SLP. 8. Estimated rehab length of stay to reach the above functional goals is: 7-10 days- due to very strong 9. Anticipated discharge destination: Home 10. Overall Rehab/Functional Prognosis: good  RECOMMENDATIONS: This patient's condition is appropriate for continued rehabilitative care in the following setting: CIR Patient has agreed to participate in recommended program. Yes and Potentially Note that insurance prior authorization may be required for reimbursement for recommended care.  Comment:  1. Suggest flutter valve scheduled and albuterol prn 2. Hasn't eaten a lot, but if no BM by tomorrow, suggest KUB vs increased bowel meds.  3. Think pt appropriate for inpt CIR- for 7-10 days or so- goals MIN A- Supervision 4. Thank you for consult.   Bary Leriche, PA-C 10/25/2020    I have personally performed a face to face diagnostic evaluation of this patient and formulated the key components of the plan.  Additionally, I have personally reviewed laboratory data, imaging studies, as well as relevant notes and concur with the physician assistant's documentation above.

## 2020-10-25 NOTE — Care Management Important Message (Signed)
Important Message  Patient Details  Name: [name] MRN: [id number] Date of Birth: 10-23-1932   Medicare Important Message Given:  Yes     Reynald Woods P Jorel Gravlin 10/25/2020, 11:52 AM

## 2020-10-25 NOTE — Progress Notes (Signed)
Physical Therapy Treatment Patient Details Name: [name] MRN: [id number] DOB: June 28, 1933 Today's Date: 10/25/2020    History of Present Illness 87 yo with COPD, Heart failure admitted to [facility] with volume overload. Intubated 3/13 - 14 for AMS, suspected pulm edema, possible sz and transferred to [facility] for further assessment and care. MRI + Small acute left frontal white matter infarct. EEG 3/14 - moderate diffuse encephalopathy.    PT Comments    Pt is much improved with mobility and cognition, however pt continues to be limited in safe mobility by decreased safety awareness in terms of recognizing her own fatigue and with navigation in room with ambulation. Pt friend in room relates that pt is having difficulty with word finding and sequencing. Pt is currently modA for power up and steadying with RW and is able to ambulate 15 feet with modA and very close chair follow. Pt with decreased safety with sitting back in chair. Pt unable to gauge her own fatigue, sure she is able to walk back 15 feet in room however slumps over her RW with attempt to turn around to walk back and needs modA for safely sitting back in chair. Pt's friend relates that she is working to be able to provide 24 hour care with d/c from CIR. Pt continues to be good CIR candidate due to need for multidisciplinary rehabilitation as well as desire to return to PLOF.    Follow Up Recommendations  CIR     Equipment Recommendations  Other (comment) (continuing to assess)    Recommendations for Other Services Rehab consult;OT consult     Precautions / Restrictions Precautions Precautions: Fall Precaution Comments: jerking UE/LE Restrictions Weight Bearing Restrictions: No    Mobility  Bed Mobility               General bed mobility comments: sitting up in recliner on entry    Transfers Overall transfer level: Needs assistance Equipment used: Rolling walker (2 wheeled) Transfers: Sit to/from Stand Sit to  Stand: Mod assist;+2 safety/equipment;From elevated surface         General transfer comment: modA for power up to RW x 3 during session, increased cuing for hand placement, upright posture and posterior pelvic tilt, pt requires modA for sitting back in recliner in 2 instances pt sits without being close to chair requiring assist to get hips back and down in chair  Ambulation/Gait Ambulation/Gait assistance: Mod assist;+2 safety/equipment Gait Distance (Feet): 15 Feet Assistive device: Rolling walker (2 wheeled) Gait Pattern/deviations: Step-through pattern;Decreased step length - right;Decreased step length - left;Shuffle;Trunk flexed Gait velocity: slowed Gait velocity interpretation: <1.31 ft/sec, indicative of household ambulator General Gait Details: modA with close chair follow for slowed, mildly unsteady gait, requiring increased cuing for upright posture and navigation around obstacles in room. pt reports readiness for ambulation back to bed however before she can turn around she is slumped over the RW secondary to fatigue.      Modified Rankin (Stroke Patients Only) Modified Rankin (Stroke Patients Only) Pre-Morbid Rankin Score: Slight disability Modified Rankin: Moderately severe disability     Balance Overall balance assessment: Needs assistance   Sitting balance-Leahy Scale: Fair     Standing balance support: Bilateral upper extremity supported Standing balance-Leahy Scale: Poor                              Cognition Arousal/Alertness: Awake/alert Behavior During Therapy: Flat affect Overall Cognitive Status: Impaired/Different from baseline  Area of Impairment: Orientation;Attention;Memory;Following commands;Safety/judgement;Awareness;Problem solving                 Orientation Level: Disoriented to;Situation;Time Current Attention Level: Selective Memory: Decreased short-term memory Following Commands: Follows one step commands with  increased time;Follows multi-step commands inconsistently;Follows multi-step commands with increased time Safety/Judgement: Decreased awareness of deficits;Decreased awareness of safety Awareness: Emergent Problem Solving: Slow processing;Decreased initiation;Difficulty sequencing;Requires verbal cues;Requires tactile cues General Comments: improved cognition, continues to require increased time and multimodal cuing, decreased safety awareness with ambulation and sitting         General Comments General comments (skin integrity, edema, etc.): Pt's "daughter" is in room and relates pt with increased cognitive difficulties including word finding and perseveration about not wanting to be intubated      Pertinent Vitals/Pain Pain Assessment: No/denies pain Faces Pain Scale: No hurt           PT Goals (current goals can now be found in the care plan section) Acute Rehab PT Goals Patient Stated Goal: to get better PT Goal Formulation: With patient/family Time For Goal Achievement: 11/06/20 Potential to Achieve Goals: Good    Frequency    Min 4X/week      PT Plan Current plan remains appropriate       AM-PAC PT "6 Clicks" Mobility   Outcome Measure  Help needed turning from your back to your side while in a flat bed without using bedrails?: A Lot Help needed moving from lying on your back to sitting on the side of a flat bed without using bedrails?: A Lot Help needed moving to and from a bed to a chair (including a wheelchair)?: A Lot Help needed standing up from a chair using your arms (e.g., wheelchair or bedside chair)?: A Lot Help needed to walk in hospital room?: Total Help needed climbing 3-5 steps with a railing? : Total 6 Click Score: 10    End of Session Equipment Utilized During Treatment: Gait belt Activity Tolerance: Patient tolerated treatment well Patient left: with call bell/phone within reach;with family/visitor present;in bed (RN notified no chair alarm  present) Nurse Communication: Mobility status PT Visit Diagnosis: Difficulty in walking, not elsewhere classified (R26.2);Muscle weakness (generalized) (M62.81);Other symptoms and signs involving the nervous system (Q25.956)     Time: 3875-6433 PT Time Calculation (min) (ACUTE ONLY): 34 min  Charges:  $Gait Training: 8-22 mins $Therapeutic Activity: 8-22 mins                     Alleene Stoy B. Migdalia Dk PT, DPT Acute Rehabilitation Services Pager [phone] Office [phone]    [place] 10/25/2020, 5:09 PM

## 2020-10-25 NOTE — Progress Notes (Signed)
STROKE TEAM PROGRESS NOTE   INTERVAL HISTORY Patient was extubated yesterday and is now transferred to floor bed.  She is sitting in a bedside chair and looks quite comfortable.  Her daughter is at the bedside.  The daughter feels patient slightly disoriented and cognitively not back to baseline but there are no physical deficits related to her stroke.  Vitals:   10/25/20 0313 10/25/20 0500 10/25/20 0721 10/25/20 1202  BP: 137/71  (!) 150/75 137/86  Pulse: 100  (!) 106 (!) 110  Resp: 20  20 20   Temp: 97.6 F (36.4 C)  98.1 F (36.7 C) 98.7 F (37.1 C)  TempSrc: Oral  Oral Oral  SpO2: 92%  (!) 81% 90%  Weight:  96.2 kg    Height:       CBC:  Recent Labs  Lab 10/24/20 0135 10/25/20 0121  WBC 9.0 10.1  HGB 13.8 13.6  HCT 45.2 44.0  MCV 97.6 96.3  PLT 145* 607*   Basic Metabolic Panel:  Recent Labs  Lab 10/22/20 2310 10/23/20 0435 10/24/20 0135 10/25/20 0121  NA 140   < > 138 136  K 5.0   < > 4.6 3.7  CL 94*   < > 87* 88*  CO2 33*   < > 42* 41*  GLUCOSE 136*   < > 108* 119*  BUN 27*   < > 28* 21  CREATININE 1.10*   < > 1.11* 0.92  CALCIUM 8.8*   < > 9.0 8.7*  MG 2.3  --   --   --    < > = values in this interval not displayed.   Lipid Panel:  Recent Labs  Lab 10/24/20 0135  CHOL 169  TRIG 84  HDL 47  CHOLHDL 3.6  VLDL 17  LDLCALC 105*   HgbA1c:  Recent Labs  Lab 10/24/20 0135  HGBA1C 5.9*   Urine Drug Screen:  Recent Labs  Lab 10/23/20 1422  LABOPIA NONE DETECTED  COCAINSCRNUR NONE DETECTED  LABBENZ NONE DETECTED  AMPHETMU NONE DETECTED  THCU NONE DETECTED  LABBARB NONE DETECTED    Alcohol Level No results for input(s): ETH in the last 168 hours.  IMAGING past 24 hours MR ANGIO HEAD WO CONTRAST  Normal PICA origins. Patent basilar artery with tortuosity and mild irregularity. No significant basilar stenosis. SCA and right PCA origins are within normal limits. Fetal type left PCA origin with small left P1. Right posterior communicating  artery is diminutive or absent. Mild bilateral PCA irregularity. Mildly tortuous left PCA P2 segment. Antegrade flow in both ICA siphons. Evidence of bilateral siphon calcified plaque. Tortuous cavernous segments. No significant left siphon stenosis. Mild to moderate supraclinoid right ICA stenosis. Normal ophthalmic and left posterior communicating artery origins. Patent carotid termini. Patent MCA and ACA origins. Dominant left ACA A1 segment. Anterior communicating artery and visible ACA branches are within normal limits. MCA M1 segments and bifurcations are patent without stenosis. Tortuous right M1. Visible bilateral MCA branches are within normal limits.  IMPRESSION:  1. Negative for large vessel occlusion, positive for widespread intracranial atherosclerosis. 2. Up to moderate stenosis of the Left Vertebrobasilar junction, supraclinoid Right ICA.  MR BRAIN WO CONTRAST Result Date: 10/23/2020 IMPRESSION: 1. Small acute left frontal white matter infarct. 2. Mild chronic small vessel ischemic disease.  DG Chest Port 1 View Result Date: 10/23/2020 IMPRESSION: 1. Minimal left basilar consolidation, favor atelectasis, stable. 2. Unremarkable endotracheal tube.   DG Chest Port 1 View Result Date: 10/23/2020 IMPRESSION: Endotracheal  tube tip about 1.7 cm superior to the carina. Patchy airspace disease at the left base, atelectasis versus minimal pneumonia versus aspiration   ECHO Result Date 10/22/2020 1. Left ventricular ejection fraction, by estimation, is 65 to 70%. The  left ventricle has normal function. Left ventricular endocardial border  not optimally defined to evaluate regional wall motion. There is mild  concentric left ventricular hypertrophy. Left ventricular diastolic parameters are indeterminate.  2. Right ventricular systolic function is normal. The right ventricular  size is normal. Tricuspid regurgitation signal is inadequate for assessing PA pressure.  3. The mitral valve is  degenerative. No evidence of mitral valve  regurgitation. No evidence of mitral stenosis.  4. The aortic valve was not well visualized. Aortic valve regurgitation  is not visualized. No aortic stenosis is present.  5. The inferior vena cava is dilated in size with >50% respiratory  variability, suggesting right atrial pressure of 8 mmHg.  Lower extremity dopplars Result Date 10/22/2020 There is no evidence of deep vein thrombosis in the lower extremities  PHYSICAL EXAM Physical Exam  Obese elderly lady who is not in distress HEENT: [place]/AT Lungs: Clear to auscultation Ext: Warm and well-perfused  Neuro: Mental Status:  Awake alert oriented to time place and person.  Slightly diminished attention, registration and recall.  Follows commands well.  No dysarthria aphasia or apraxia. Cranial Nerves: II:    Pupils equal, round and sluggishly reactive to light bilaterally 4 mm >> 3 mm.  III,IV, VI: Eyes are conjugate at the midline. No nystagmus.  V,VII: Face flaccidly symmetric. No blink to eyelash stimulation (sedated).  VIII: No response to auditory stimuli (sedated) IX,X: Intubated XI: Unable to assess XII: Intubated Motor: Moves all 4 extremities well against gravity without focal weakness.  Sensory: Movement to noxious as above.  Cerebellar   Intact finger-to-nose and needle coordination.  Gait deferred  ASSESSMENT/PLAN [name] is a 87 y.o. female with history of COPD, hypertension, hyperlipidemia who presented to the ED for1.5 weeksof shortness of breath and leg swelling. Due to increased urinary frequency at night, her PCP stopped her HCTZ about twoweeksprior to presentation. Since then she noticed increased ankleswelling and shortness of breath which is worse with exertion.  She was taken to the ED.  While in the hospital, she developed mild slurred speech and rhythmic movements of both arms, R>L. Additionally, she has frequent episodes of confusions and  disorientation. CT head was negative.  MRI brain revealed subcentimeter acute left frontal lobe white matter ischemic infarction and mild chronic small vessel disease.  She was placed on EEG monitoring.  She was intubate for airway protection and fluid overload.  Stroke:  left cerebral hemisphere acute small vessel white matter stroke incidental  CT head No acute abnormality.     MRI  Small acute left frontal white matter infarct, mild chronic small vessel ischemic disease.   MRA head: negative for LVO, intracranial atherosclerosis, moderate stenosis of the Left Vertebrobasilar junction,  supraclinoid Right ICA.   Carotid Doppler right ICA 1-39% stenosis.  Left ICA 40-59% stenosis  2D Echo EF 65-70%  LDL 105  HgbA1c 5.9  VTE prophylaxis - Lovenox 40mg  daily  No antithrombotic prior to admission, now on aspirin 81 mg daily.   Therapy recommendations: PT CIR  Disposition: Pending  Concern for seizures  RN at [facility] noticed pt having tremor with acute onset of aphasia  EEG: moderate diffuse encephalopathy, nonspecific etiology. Patient was noted to have bilateral hand tremors during  the study without concomitant EEG change and were NOT epileptic. No seizures or definite epileptiform discharges were seen throughout the recording.  Hypertensive Urgency  Home meds:  Metoprolol 75mg  daily  SBP goal <150  Acute Respiratory Failure  Intubated on 3/13 for respiratory distress, flash pulmonary edema, and hypoxia  Frothy pink sputum noted on intubation  Received 60 mg IV lasix  Patient extubated this am  Hyperlipidemia  Home meds:  zocor 5mg  daily, resumed in hospital  LDL 105, goal < 70  Continue statin at discharge  Diabetes type II Controlled  Home meds:  none  HgbA1c 5.9, goal < 7.0  CBGs Recent Labs    10/24/20 2210 10/25/20 0725 10/25/20 1159  GLUCAP 132* 112* 152*      SSI  Other Stroke Risk Factors  Advanced Age >/= 58   Obesity, Body  mass index is 41.77 kg/m., BMI >/= 30 associated with increased stroke risk, recommend weight loss, diet and exercise as appropriate   Coronary artery disease:    Congestive heart failure  Other Active Problems  COPD  Osteoarthritis   Hospital day # 4 Continue aspirin for stroke prevention and aggressive risk factor modification.  Mobilize out of bed and therapy consults.  Management of heart failure as per primary team.  From discussion with patient and daughter and answered questions.  Greater than 50% time during this 25-minute visit was spent on counseling and coordination of both the lacunar stroke and risk of stroke prevention is in question.  Stroke team will sign off.  Kindly call for questions    Antony Contras, MD  Antony Contras, MD Medical Director [place] Pager: [phone] 10/25/2020 1:33 PM   To contact Stroke Continuity provider, please refer to [email or web address] After hours, contact General Neurology

## 2020-10-25 NOTE — Progress Notes (Signed)
Pt iPAD was delivered to [facility] secretary to return to patient iPAD was left at [facility] during transport.

## 2020-10-25 NOTE — Progress Notes (Signed)
Ipad given to pt that was brought from [facility]

## 2020-10-25 NOTE — Patient Care Conference (Signed)
Called and updated patient's sister over phone. All questions were answered.

## 2020-10-25 NOTE — Progress Notes (Signed)
Patient refused to wear CPAP multiple times intially. RN told patient she should try it and the patient agreed. Once RT brought the CPAP machine into the patient's room, the patient again refused to wear the CPAP multiple times. Patient stated she does not want a machine to blow air into her face to help her breath when she is already breathing fine.

## 2020-10-25 NOTE — Progress Notes (Signed)
Inpatient Rehab Admissions Coordinator Note:   Per therapy recommendations, pt was screened for CIR candidacy by Shann Medal, PT, DPT.  At this time we are recommending CIR consult and I will place an order per our protocol.  Please contact me with questions.   Shann Medal, PT, DPT [phone] 10/25/20 9:15 AM

## 2020-10-25 NOTE — Progress Notes (Signed)
PROGRESS NOTE    [name]  YHO:[id number] DOB: May 12, 1933 DOA: 10/21/2020 PCP: Shirline Frees, MD    Brief Narrative:  87 year old female with history of COPD, hypertension, hyperlipidemia presented with1.5 weeksof shortness of breath and leg swelling. Due to increased urinary frequency at night, her PCP stopped her HCTZ about twoweeksago. Since then she noticed increased ankleswelling and shortness of breath. Worse with exertion.Sleeps in a reclined bed but has not increased the angle recently.She tried to see her doctor but there were no available appointments and so was instructed to come to the ED for eval. No prior history of CHF, distant history of tobacco use. BNP 273.5 D-dimer 0.65 Doppler ultrasound lower extremities negative for DVT Chest x-ray showed no acute disease  Assessment & Plan:   Principal Problem:   Acute respiratory failure with hypoxia (HCC) Active Problems:   Essential hypertension   Morbid obesity (HCC)   COPD (chronic obstructive pulmonary disease) (HCC)   Hypertensive urgency   Ischemic stroke of frontal lobe (HCC)   Tremors of nervous system   Volume overload   Acute encephalopathy with tremors, unclear etiology -While at [facility], patient was noted to have acute tremors and mental status change - Ammonia level, B12, folate, TSH all within normal limits, UA negative for UTI.  Pharmacist reviewed meds with outpatient pharmacy, not on any benzodiazepines or medications that would cause dyskinesia.  No history of alcohol on H&P. -Neurology was consulted, who recommended transfer to [facility] for stroke work up -MRI reviewed. Pt was found to have a L cerebral hemisphere acute small vessel white matter stroke noted -Stroke team had been following, recommendation for ASA 50m daily -Therapy recommendations for CIR, pending -Stroke team has since signed off    Acute respiratory failure with hypoxia ([place], suspecting new onset CHF -Possibly multifactorial  with COPD, may have OSA/OHV  -[facility]D-dimer was slightly elevated 0.65, Doppler ultrasound lower extremities negative for DVT -Will need outpatient sleep study to rule out OSA -2D echo showed EF of 65 to 70%, mild concentric LVH right ventricular systolic function normal -[facility] failure, requiring intubation and transfer to PCCM service -Pt was later weaned and was successfully extubated on 3/14[facility] -ABG reviewed, pt appears to be chronically retaining CO2, also has serum bicarb of over 40. Consider Pickwickian syndrome, especially in the setting of obesity -Recommend positive pressure ventilation at night, however doubt patient compliance.   Active Problems:   Essential hypertension -BP stable at this time -had been on beta blocker, currently off     COPD (chronic obstructive pulmonary disease) (HCC) -Currently no acute wheezing at this time, however decrease breath sounds are noted -ABG reviewed, evidence of chronic CO2 retainer  Hyperlipidemia - Continue statin    Morbid obesity Recommend diet/lifestyle modification  DVT prophylaxis: Lovenox subq Code Status: No intubation Family Communication: Pt in room, family is not at bedside  Status is: Inpatient  Remains inpatient appropriate because:Unsafe d/c plan and Inpatient level of care appropriate due to severity of illness   Dispo: The patient is from: Home              Anticipated d/c is to: CIR              Patient currently is not medically stable to d/c.   Difficult to place patient No       Consultants:   Neurology/stroke team  PCCM  Procedures:     Antimicrobials: Anti-infectives (From admission, onward)   None       Subjective: Without complaints. Somewhat confused, states she will refuse to wear cpap at night if ordered  Objective: Vitals:   10/25/20 0500 10/25/20 0721 10/25/20 1202  10/25/20 1658  BP:  (!) 150/75 137/86 (!) 121/97  Pulse:  (!) 106 (!) 110   Resp:  '20 20 20  ' Temp:  98.1 F (36.7 C) 98.7 F (37.1 C) 98.7 F (37.1 C)  TempSrc:  Oral Oral Oral  SpO2:  (!) 81% 90% 91%  Weight: 96.2 kg     Height:        Intake/Output Summary (Last 24 hours) at 10/25/2020 1836 Last data filed at 10/25/2020 1415 Gross per 24 hour  Intake 480 ml  Output 200 ml  Net 280 ml   Filed Weights   10/21/20 0832 10/23/20 0404 10/25/20 0500  Weight: 94.3 kg 96.6 kg 96.2 kg    Examination: General exam: Awake, laying in bed, in nad Respiratory system: Normal respiratory effort, no wheezing Cardiovascular system: regular rate, s1, s2 Gastrointestinal system: Soft, nondistended, positive BS Central nervous system: CN2-12 grossly intact, strength intact Extremities: Perfused, no clubbing Skin: Normal skin turgor, no notable skin lesions seen Psychiatry: Mood normal // no visual hallucinations   Data Reviewed: I have personally reviewed following labs and imaging studies  CBC: Recent Labs  Lab 10/21/20 0944 10/22/20 0436 10/23/20 0435 10/24/20 0015 10/24/20 0135 10/25/20 0121  WBC 7.9 7.8 9.2  --  9.0 10.1  HGB 13.8 13.8 14.0 14.6 13.8 13.6  HCT 45.3 47.9* 47.4* 43.0 45.2 44.0  MCV 99.6 102.4* 102.8*  --  97.6 96.3  PLT 153 163 163  --  145* 025*   Basic Metabolic Panel: Recent Labs  Lab 10/22/20 0436 10/22/20 2310 10/23/20 0435 10/24/20 0015 10/24/20 0135 10/25/20 0121  NA 142 140 139 137 138 136  K 4.9 5.0 4.8 4.1 4.6 3.7  CL 97* 94* 92*  --  87* 88*  CO2 34* 33* 36*  --  42* 41*  GLUCOSE 136* 136* 132*  --  108* 119*  BUN 25* 27* 29*  --  28* 21  CREATININE 1.04* 1.10* 1.02*  --  1.11* 0.92  CALCIUM 8.9 8.8* 8.9  --  9.0 8.7*  MG  --  2.3  --   --   --   --    GFR: Estimated Creatinine Clearance: 46.2 mL/min (by C-G formula based on SCr of 0.92 mg/dL). Liver Function Tests: Recent Labs  Lab 10/22/20 2310  AST 21  ALT 26  ALKPHOS 52   BILITOT 0.6  PROT 6.8  ALBUMIN 3.6   No results for input(s): LIPASE, AMYLASE in the last 168 hours. Recent Labs  Lab 10/23/20 1143  AMMONIA 26   Coagulation Profile: No results for input(s): INR, PROTIME in the last 168 hours. Cardiac Enzymes: No results for input(s): CKTOTAL, CKMB, CKMBINDEX, TROPONINI in the last 168 hours. BNP (last 3 results) No results for input(s): PROBNP in the last 8760 hours. HbA1C: Recent Labs    10/24/20 0135  HGBA1C 5.9*   CBG: Recent Labs  Lab 10/24/20 1558 10/24/20 2210 10/25/20 0725 10/25/20 1159 10/25/20 1701  GLUCAP 123* 132* 112* 152* 150*   Lipid Profile: Recent Labs    10/24/20 0135  CHOL 169  HDL 47  LDLCALC 105*  TRIG 84  CHOLHDL 3.6   Thyroid Function Tests: Recent Labs  10/23/20 0906  TSH 0.540   Anemia Panel: Recent Labs    10/23/20 0906  VITAMINB12 571  FOLATE 14.9   Sepsis Labs: No results for input(s): PROCALCITON, LATICACIDVEN in the last 168 hours.  Recent Results (from the past 240 hour(s))  Resp Panel by RT-PCR (Flu A&B, Covid) Nasopharyngeal Swab     Status: None   Collection Time: 10/21/20  9:44 AM   Specimen: Nasopharyngeal Swab; Nasopharyngeal(NP) swabs in vial transport medium  Result Value Ref Range Status   SARS Coronavirus 2 by RT PCR NEGATIVE NEGATIVE Final    Comment: (NOTE) SARS-CoV-2 target nucleic acids are NOT DETECTED.  The SARS-CoV-2 RNA is generally detectable in upper respiratory specimens during the acute phase of infection. The lowest concentration of SARS-CoV-2 viral copies this assay can detect is 138 copies/mL. A negative result does not preclude SARS-Cov-2 infection and should not be used as the sole basis for treatment or other patient management decisions. A negative result may occur with  improper specimen collection/handling, submission of specimen other than nasopharyngeal swab, presence of viral mutation(s) within the areas targeted by this assay, and  inadequate number of viral copies(<138 copies/mL). A negative result must be combined with clinical observations, patient history, and epidemiological information. The expected result is Negative.  Fact Sheet for Patients:  EntrepreneurPulse.com.au  Fact Sheet for Healthcare Providers:  IncredibleEmployment.be  This test is no t yet approved or cleared by the [place] FDA and  has been authorized for detection and/or diagnosis of SARS-CoV-2 by FDA under an Emergency Use Authorization (EUA). This EUA will remain  in effect (meaning this test can be used) for the duration of the COVID-19 declaration under Section 564(b)(1) of the Act, 21 U.S.C.section 360bbb-3(b)(1), unless the authorization is terminated  or revoked sooner.       Influenza A by PCR NEGATIVE NEGATIVE Final   Influenza B by PCR NEGATIVE NEGATIVE Final    Comment: (NOTE) The Xpert Xpress SARS-CoV-2/FLU/RSV plus assay is intended as an aid in the diagnosis of influenza from Nasopharyngeal swab specimens and should not be used as a sole basis for treatment. Nasal washings and aspirates are unacceptable for Xpert Xpress SARS-CoV-2/FLU/RSV testing.  Fact Sheet for Patients: EntrepreneurPulse.com.au  Fact Sheet for Healthcare Providers: IncredibleEmployment.be  This test is not yet approved or cleared by the [place] FDA and has been authorized for detection and/or diagnosis of SARS-CoV-2 by FDA under an Emergency Use Authorization (EUA). This EUA will remain in effect (meaning this test can be used) for the duration of the COVID-19 declaration under Section 564(b)(1) of the Act, 21 U.S.C. section 360bbb-3(b)(1), unless the authorization is terminated or revoked.  Performed at [facility], [place] [address] [place], [place] [address]   MRSA PCR Screening     Status: None   Collection Time: 10/23/20 11:00 PM    Specimen: Nasal Mucosa; Nasopharyngeal  Result Value Ref Range Status   MRSA by PCR NEGATIVE NEGATIVE Final    Comment:        The GeneXpert MRSA Assay (FDA approved for NASAL specimens only), is one component of a comprehensive MRSA colonization surveillance program. It is not intended to diagnose MRSA infection nor to guide or monitor treatment for MRSA infections. Performed at [facility], [place] [address] [place],  [address]      Radiology Studies: MR ANGIO HEAD WO CONTRAST  Result Date: 10/24/2020 CLINICAL DATA:  87 year old female with small acute left centrum semiovale white matter infarct  on MRI yesterday. EXAM: MRA HEAD WITHOUT CONTRAST TECHNIQUE: Angiographic images of the Circle of Willis were obtained using MRA technique without intravenous contrast. COMPARISON:  Brain MRI 10/23/2020. FINDINGS: No intracranial mass effect or ventriculomegaly. Antegrade flow in the posterior circulation with dominant distal right vertebral artery. No right vertebral or vertebrobasilar junction stenosis. Non dominant left vertebral artery appears more irregular, a especially near the vertebrobasilar junction with up to moderate stenosis on series 1037, image 10. Normal PICA origins. Patent basilar artery with tortuosity and mild irregularity. No significant basilar stenosis. SCA and right PCA origins are within normal limits. Fetal type left PCA origin with small left P1. Right posterior communicating artery is diminutive or absent. Mild bilateral PCA irregularity. Mildly tortuous left PCA P2 segment. Antegrade flow in both ICA siphons. Evidence of bilateral siphon calcified plaque. Tortuous cavernous segments. No significant left siphon stenosis. Mild to moderate supraclinoid right ICA stenosis. Normal ophthalmic and left posterior communicating artery origins. Patent carotid termini. Patent MCA and ACA origins. Dominant left ACA A1 segment. Anterior communicating artery and visible ACA  branches are within normal limits. MCA M1 segments and bifurcations are patent without stenosis. Tortuous right M1. Visible bilateral MCA branches are within normal limits. IMPRESSION: 1. Negative for large vessel occlusion, positive for widespread intracranial atherosclerosis. 2. Up to moderate stenosis of the Left Vertebrobasilar junction, supraclinoid Right ICA. Electronically Signed   By: Genevie Ann M.D.   On: 10/24/2020 07:02   DG Chest Port 1 View  Result Date: 10/23/2020 CLINICAL DATA:  Intubated, CVA EXAM: PORTABLE CHEST 1 VIEW COMPARISON:  10/23/2020 FINDINGS: Single frontal view of the chest demonstrates endotracheal tube overlying tracheal air column, tip approximately 2 cm above carina. Cardiac silhouette is unremarkable. Patchy consolidation at the left lung base unchanged, likely atelectasis. No effusion or pneumothorax. No acute bony abnormalities. IMPRESSION: 1. Minimal left basilar consolidation, favor atelectasis, stable. 2. Unremarkable endotracheal tube. Electronically Signed   By: Randa Ngo M.D.   On: 10/23/2020 23:09   DG Chest Port 1 View  Result Date: 10/23/2020 CLINICAL DATA:  Post intubation EXAM: PORTABLE CHEST 1 VIEW COMPARISON:  10/21/2020 FINDINGS: Interval intubation, tip of the endotracheal tube about 1.7 cm superior to the carina. Patchy airspace disease at the left base. No pleural effusion. Normal heart size. Aortic atherosclerosis. No pneumothorax. IMPRESSION: Endotracheal tube tip about 1.7 cm superior to the carina. Patchy airspace disease at the left base, atelectasis versus minimal pneumonia versus aspiration Electronically Signed   By: Donavan Foil M.D.   On: 10/23/2020 22:08   EEG adult  Result Date: 10/24/2020 Lora Havens, MD     10/24/2020 10:47 AM Patient Name: MARKIAH JANEWAY MRN: [id number] Epilepsy Attending: Lora Havens Referring Physician/Provider: Dr. Kerney Elbe Date: 10/24/2020 Duration: 35.44 mins Patient history: 87 year old female with  altered mental status and slurred speech with rhythmic movements of both arms.  EEG to evaluate for seizures. Level of alertness: Awake AEDs during EEG study: None Technical aspects: This EEG study was done with scalp electrodes positioned according to the 10-20 International system of electrode placement. Electrical activity was acquired at a sampling rate of '500Hz'  and reviewed with a high frequency filter of '70Hz'  and a low frequency filter of '1Hz' . EEG data were recorded continuously and digitally stored. Description: No clear posterior dominant rhythm was seen.  EEG showed continuous generalized polymorphic mixed frequencies with predominantly 3 to 6 Hz theta-delta slowing.  Patient was also noted to have bilateral hand tremors during the  study. Concomitant EEG before, during and after the event did not show any EEG change to suggest seizure.  Hyperventilation and photic stimulation were not performed.   ABNORMALITY -Continuous slow, generalized IMPRESSION: This study is suggestive of moderate diffuse encephalopathy, nonspecific etiology.  Patient was noted to have bilateral hand tremors during the study without concomitant EEG change and were NOT epileptic. No seizures or definite epileptiform discharges were seen throughout the recording. Priyanka O Yadav   VAS US CAROTID  Result Date: 10/25/2020 Carotid Arterial Duplex Study Indications:       CVA. Risk Factors:      Hypertension, hyperlipidemia. Limitations        Today's exam was limited due to the patient's inability or                    unwillingness to cooperate and the patient's respiratory                    variation. Comparison Study:  no prior Performing Technologist: Abram Sander RVS  Examination Guidelines: A complete evaluation includes B-mode imaging, spectral Doppler, color Doppler, and power Doppler as needed of all accessible portions of each vessel. Bilateral testing is considered an integral part of a complete examination. Limited  examinations for reoccurring indications may be performed as noted.  Right Carotid Findings: +----------+--------+--------+--------+-------------------+--------------------+           PSV cm/sEDV cm/sStenosisPlaque Description Comments             +----------+--------+--------+--------+-------------------+--------------------+ CCA Prox  114     24              heterogenous                            +----------+--------+--------+--------+-------------------+--------------------+ CCA Distal108     23              heterogenous and                                                          calcific                                +----------+--------+--------+--------+-------------------+--------------------+ ICA Prox  104     24      1-39%                      limited                                                                   visualization due to                                                      patient movement     +----------+--------+--------+--------+-------------------+--------------------+ ICA Distal  Not visualized       +----------+--------+--------+--------+-------------------+--------------------+ ECA       110                                                             +----------+--------+--------+--------+-------------------+--------------------+ +----------+--------+-------+--------+-------------------+           PSV cm/sEDV cmsDescribeArm Pressure (mmHG) +----------+--------+-------+--------+-------------------+ [id number]                                        +----------+--------+-------+--------+-------------------+ +---------+--------+---+--------+--+---------+ VertebralPSV cm/s130EDV cm/s35Antegrade +---------+--------+---+--------+--+---------+  Left Carotid Findings: +----------+--------+--------+--------+-------------------+--------------------+           PSV  cm/sEDV cm/sStenosisPlaque Description Comments             +----------+--------+--------+--------+-------------------+--------------------+ CCA Prox  109     31              heterogenous                            +----------+--------+--------+--------+-------------------+--------------------+ CCA Distal89      26              heterogenous and                                                          calcific                                +----------+--------+--------+--------+-------------------+--------------------+ ICA Prox  170     56      40-59%  heterogenous and   Velocities may                                         calcific           underestimate degree                                                      of stenosis due to                                                        more proximal                                                             obstruction.         +----------+--------+--------+--------+-------------------+--------------------+ ICA Mid  82      27                                                      +----------+--------+--------+--------+-------------------+--------------------+ ICA Distal78      31                                                      +----------+--------+--------+--------+-------------------+--------------------+ ECA       113                                                             +----------+--------+--------+--------+-------------------+--------------------+ +----------+--------+--------+--------+-------------------+           PSV cm/sEDV cm/sDescribeArm Pressure (mmHG) +----------+--------+--------+--------+-------------------+ [id number]                                         +----------+--------+--------+--------+-------------------+ +---------+--------+--+--------+--+---------+ VertebralPSV cm/s84EDV cm/s20Antegrade  +---------+--------+--+--------+--+---------+   Summary: Right Carotid: Velocities in the right ICA are consistent with a 1-39% stenosis. Left Carotid: Velocities in the left ICA are consistent with a 40-59% stenosis. Vertebrals: Bilateral vertebral arteries demonstrate antegrade flow. *See table(s) above for measurements and observations.  Electronically signed by Antony Contras MD on 10/25/2020 at 12:50:43 PM.    Final     Scheduled Meds: . aspirin EC  81 mg Oral Daily  . chlorhexidine gluconate (MEDLINE KIT)  15 mL Mouth Rinse BID  . docusate  100 mg Per Tube BID  . enoxaparin (LOVENOX) injection  40 mg Subcutaneous Q24H  . insulin aspart  0-9 Units Subcutaneous Q4H  . simvastatin  5 mg Oral Daily  . sodium chloride flush  3 mL Intravenous Q12H   Continuous Infusions:   LOS: 4 days   Marylu Lund, MD [facility] Pager On Amion  If 7PM-7AM, please contact night-coverage 10/25/2020, 6:36 PM

## 2020-10-26 ENCOUNTER — Inpatient Hospital Stay (HOSPITAL_COMMUNITY)
Admission: RE | Admit: 2020-10-26 | Discharge: 2020-11-05 | DRG: 056 | Disposition: A | Discharge status: Home Health | Payer: Medicare Other | Source: Intra-hospital | Attending: Physical Medicine & Rehabilitation | Admitting: Physical Medicine & Rehabilitation

## 2020-10-26 ENCOUNTER — Other Ambulatory Visit: Payer: Self-pay

## 2020-10-26 ENCOUNTER — Encounter (HOSPITAL_COMMUNITY): Payer: Self-pay | Admitting: Anesthesiology

## 2020-10-26 DIAGNOSIS — Z6841 Body Mass Index (BMI) 40.0 and over, adult: Secondary | ICD-10-CM | POA: Diagnosis not present

## 2020-10-26 DIAGNOSIS — G9340 Encephalopathy, unspecified: Secondary | ICD-10-CM

## 2020-10-26 DIAGNOSIS — M545 Low back pain, unspecified: Secondary | ICD-10-CM

## 2020-10-26 DIAGNOSIS — I672 Cerebral atherosclerosis: Secondary | ICD-10-CM | POA: Diagnosis present

## 2020-10-26 DIAGNOSIS — R7303 Prediabetes: Secondary | ICD-10-CM | POA: Diagnosis present

## 2020-10-26 DIAGNOSIS — I69398 Other sequelae of cerebral infarction: Secondary | ICD-10-CM

## 2020-10-26 DIAGNOSIS — Z881 Allergy status to other antibiotic agents status: Secondary | ICD-10-CM

## 2020-10-26 DIAGNOSIS — Z9981 Dependence on supplemental oxygen: Secondary | ICD-10-CM

## 2020-10-26 DIAGNOSIS — I10 Essential (primary) hypertension: Secondary | ICD-10-CM | POA: Diagnosis present

## 2020-10-26 DIAGNOSIS — Z888 Allergy status to other drugs, medicaments and biological substances status: Secondary | ICD-10-CM | POA: Diagnosis not present

## 2020-10-26 DIAGNOSIS — J449 Chronic obstructive pulmonary disease, unspecified: Secondary | ICD-10-CM | POA: Diagnosis present

## 2020-10-26 DIAGNOSIS — E871 Hypo-osmolality and hyponatremia: Secondary | ICD-10-CM | POA: Diagnosis present

## 2020-10-26 DIAGNOSIS — G478 Other sleep disorders: Secondary | ICD-10-CM | POA: Diagnosis present

## 2020-10-26 DIAGNOSIS — G4700 Insomnia, unspecified: Secondary | ICD-10-CM | POA: Diagnosis present

## 2020-10-26 DIAGNOSIS — G479 Sleep disorder, unspecified: Secondary | ICD-10-CM | POA: Diagnosis not present

## 2020-10-26 DIAGNOSIS — Y92239 Unspecified place in hospital as the place of occurrence of the external cause: Secondary | ICD-10-CM | POA: Diagnosis present

## 2020-10-26 DIAGNOSIS — N179 Acute kidney failure, unspecified: Secondary | ICD-10-CM | POA: Diagnosis present

## 2020-10-26 DIAGNOSIS — G8929 Other chronic pain: Secondary | ICD-10-CM | POA: Diagnosis present

## 2020-10-26 DIAGNOSIS — R739 Hyperglycemia, unspecified: Secondary | ICD-10-CM

## 2020-10-26 DIAGNOSIS — G4733 Obstructive sleep apnea (adult) (pediatric): Secondary | ICD-10-CM | POA: Diagnosis not present

## 2020-10-26 DIAGNOSIS — E785 Hyperlipidemia, unspecified: Secondary | ICD-10-CM | POA: Diagnosis present

## 2020-10-26 DIAGNOSIS — M170 Bilateral primary osteoarthritis of knee: Secondary | ICD-10-CM | POA: Diagnosis present

## 2020-10-26 DIAGNOSIS — R0689 Other abnormalities of breathing: Secondary | ICD-10-CM | POA: Diagnosis not present

## 2020-10-26 DIAGNOSIS — Z8249 Family history of ischemic heart disease and other diseases of the circulatory system: Secondary | ICD-10-CM | POA: Diagnosis not present

## 2020-10-26 DIAGNOSIS — Z87891 Personal history of nicotine dependence: Secondary | ICD-10-CM | POA: Diagnosis not present

## 2020-10-26 DIAGNOSIS — K5900 Constipation, unspecified: Secondary | ICD-10-CM | POA: Diagnosis present

## 2020-10-26 DIAGNOSIS — Z8639 Personal history of other endocrine, nutritional and metabolic disease: Secondary | ICD-10-CM | POA: Diagnosis not present

## 2020-10-26 DIAGNOSIS — I639 Cerebral infarction, unspecified: Secondary | ICD-10-CM | POA: Diagnosis present

## 2020-10-26 DIAGNOSIS — Z79899 Other long term (current) drug therapy: Secondary | ICD-10-CM | POA: Diagnosis not present

## 2020-10-26 DIAGNOSIS — E8770 Fluid overload, unspecified: Secondary | ICD-10-CM | POA: Diagnosis present

## 2020-10-26 DIAGNOSIS — Z880 Allergy status to penicillin: Secondary | ICD-10-CM | POA: Diagnosis not present

## 2020-10-26 DIAGNOSIS — I6931 Attention and concentration deficit following cerebral infarction: Principal | ICD-10-CM

## 2020-10-26 DIAGNOSIS — T502X5A Adverse effect of carbonic-anhydrase inhibitors, benzothiadiazides and other diuretics, initial encounter: Secondary | ICD-10-CM | POA: Diagnosis present

## 2020-10-26 DIAGNOSIS — J9622 Acute and chronic respiratory failure with hypercapnia: Secondary | ICD-10-CM | POA: Diagnosis present

## 2020-10-26 DIAGNOSIS — J9621 Acute and chronic respiratory failure with hypoxia: Secondary | ICD-10-CM | POA: Diagnosis present

## 2020-10-26 LAB — BASIC METABOLIC PANEL
Anion gap: 9 (ref 5–15)
BUN: 21 mg/dL (ref 8–23)
CO2: 35 mmol/L — ABNORMAL HIGH (ref 22–32)
Calcium: 8.9 mg/dL (ref 8.9–10.3)
Chloride: 90 mmol/L — ABNORMAL LOW (ref 98–111)
Creatinine, Ser: 1.08 mg/dL — ABNORMAL HIGH (ref 0.44–1.00)
GFR, Estimated: 50 mL/min — ABNORMAL LOW (ref >60)
Glucose, Bld: 122 mg/dL — ABNORMAL HIGH (ref 70–99)
Potassium: 3.9 mmol/L (ref 3.5–5.1)
Sodium: 134 mmol/L — ABNORMAL LOW (ref 135–145)

## 2020-10-26 LAB — GLUCOSE, CAPILLARY
Glucose-Capillary: 110 mg/dL — ABNORMAL HIGH (ref 70–99)
Glucose-Capillary: 128 mg/dL — ABNORMAL HIGH (ref 70–99)
Glucose-Capillary: 117 mg/dL — ABNORMAL HIGH (ref 70–99)

## 2020-10-26 LAB — CBC
HCT: 42 % (ref 36.0–46.0)
Hemoglobin: 13.5 g/dL (ref 12.0–15.0)
MCH: 30.1 pg (ref 26.0–34.0)
MCHC: 32.1 g/dL (ref 30.0–36.0)
MCV: 93.8 fL (ref 80.0–100.0)
Platelets: 164 10*3/uL (ref 150–400)
RBC: 4.48 MIL/uL (ref 3.87–5.11)
RDW: 13 % (ref 11.5–15.5)
WBC: 10.4 10*3/uL (ref 4.0–10.5)
nRBC: 0 % (ref 0.0–0.2)

## 2020-10-26 MED ORDER — ENOXAPARIN SODIUM 40 MG/0.4ML ~~LOC~~ SOLN
40.0000 mg | SUBCUTANEOUS | Status: DC
  Administered 2020-10-26 – 2020-11-04 (×10): 40 mg via SUBCUTANEOUS
  Filled 2020-10-26 (×10): qty 0.4

## 2020-10-26 MED ORDER — ACETAMINOPHEN 325 MG PO TABS: 325.0000 mg | ORAL_TABLET | ORAL | Status: DC | PRN

## 2020-10-26 MED ORDER — ALUM & MAG HYDROXIDE-SIMETH 200-200-20 MG/5ML PO SUSP
30.0000 mL | ORAL | Status: DC | PRN
Start: 2020-10-26 — End: 2020-11-05
  Filled 2020-10-26: qty 30

## 2020-10-26 MED ORDER — FLEET ENEMA 7-19 GM/118ML RE ENEM
1.0000 | ENEMA | Freq: Once | RECTAL | Status: DC | PRN
  Filled 2020-10-26: qty 1

## 2020-10-26 MED ORDER — BISACODYL 10 MG RE SUPP: 10.0000 mg | Freq: Every day | RECTAL | Status: DC | PRN

## 2020-10-26 MED ORDER — POLYETHYLENE GLYCOL 3350 17 G PO PACK
17.0000 g | PACK | Freq: Every day | ORAL | Status: DC | PRN
  Filled 2020-10-26: qty 1

## 2020-10-26 MED ORDER — PROCHLORPERAZINE MALEATE 5 MG PO TABS
5.0000 mg | ORAL_TABLET | Freq: Four times a day (QID) | ORAL | Status: DC | PRN
  Filled 2020-10-26: qty 2

## 2020-10-26 MED ORDER — DOCUSATE SODIUM 100 MG PO CAPS
100.0000 mg | ORAL_CAPSULE | Freq: Two times a day (BID) | ORAL | Status: DC
  Administered 2020-10-27 – 2020-11-04 (×14): 100 mg via ORAL
  Filled 2020-10-26 (×20): qty 1

## 2020-10-26 MED ORDER — PROCHLORPERAZINE 25 MG RE SUPP
12.5000 mg | Freq: Four times a day (QID) | RECTAL | Status: DC | PRN
Start: 2020-10-26 — End: 2020-11-05
  Filled 2020-10-26: qty 1

## 2020-10-26 MED ORDER — ASPIRIN EC 81 MG PO TBEC
81.0000 mg | DELAYED_RELEASE_TABLET | Freq: Every day | ORAL | Status: DC
  Administered 2020-10-27 – 2020-11-05 (×10): 81 mg via ORAL
  Filled 2020-10-26 (×10): qty 1

## 2020-10-26 MED ORDER — SIMVASTATIN 5 MG PO TABS
5.0000 mg | ORAL_TABLET | Freq: Every day | ORAL | Status: DC
  Administered 2020-10-27 – 2020-11-05 (×10): 5 mg via ORAL
  Filled 2020-10-26 (×10): qty 1

## 2020-10-26 MED ORDER — ALBUTEROL SULFATE (2.5 MG/3ML) 0.083% IN NEBU: 2.5000 mg | INHALATION_SOLUTION | RESPIRATORY_TRACT | Status: DC | PRN

## 2020-10-26 MED ORDER — GUAIFENESIN-DM 100-10 MG/5ML PO SYRP
5.0000 mL | ORAL_SOLUTION | Freq: Four times a day (QID) | ORAL | Status: DC | PRN
  Administered 2020-11-02: 10 mL via ORAL
  Filled 2020-10-26 (×2): qty 10

## 2020-10-26 MED ORDER — PROCHLORPERAZINE EDISYLATE 10 MG/2ML IJ SOLN: 5.0000 mg | Freq: Four times a day (QID) | INTRAMUSCULAR | Status: DC | PRN

## 2020-10-26 MED ORDER — DIPHENHYDRAMINE HCL 12.5 MG/5ML PO ELIX: 12.5000 mg | ORAL_SOLUTION | Freq: Four times a day (QID) | ORAL | Status: DC | PRN

## 2020-10-26 MED ORDER — DOCUSATE SODIUM 50 MG/5ML PO LIQD: 100.0000 mg | Freq: Two times a day (BID) | ORAL | Status: DC

## 2020-10-26 MED ORDER — TRAZODONE HCL 50 MG PO TABS
25.0000 mg | ORAL_TABLET | Freq: Every evening | ORAL | Status: DC | PRN
  Administered 2020-10-27 – 2020-11-02 (×2): 50 mg via ORAL
  Filled 2020-10-26 (×4): qty 1

## 2020-10-26 MED ORDER — ASPIRIN 81 MG PO TBEC: 81.0000 mg | DELAYED_RELEASE_TABLET | Freq: Every day | ORAL | 11 refills | Status: AC

## 2020-10-26 NOTE — Progress Notes (Signed)
Inpatient Rehabilitation Medication Review by a Pharmacist  A complete drug regimen review was completed for this patient to identify any potential clinically significant medication issues.  Clinically significant medication issues were identified:  no  Check AMION for pharmacist assigned to patient if future medication questions/issues arise during this admission.  Pharmacist comments:   Time spent performing this drug regimen review (minutes):  15   Jaculin Rasmus 10/26/2020 6:44 PM

## 2020-10-26 NOTE — Progress Notes (Addendum)
PROGRESS NOTE        PATIENT DETAILS Name: [name] Age: 87 y.o. Sex: female Date of Birth: 1932/12/10 Admit Date: 10/21/2020 Admitting Physician Harold Hedge, MD QAS:[id number], Gwyndolyn Saxon, MD  Brief Narrative: Patient is a 87 y.o. female with history of COPD, HTN, HLD presented to [facility] on 3/11 with worsening lower extremity swelling and shortness of breath-she was thought to have acute on chronic diastolic heart failure-hospital course complicated by acute encephalopathy (due to hypercarbia), acute CVA-she was being transferred to [facility] for further evaluation when she developed worsening hypoxia requiring intubation.  She was stabilized in the [place] further stability transfer to the [facility].  See below for further details.  .  Significant events: 3/11>> admitted to [facility] for volume overload 3/12>> developed confusion/tremors 3/13>> worsening hypoxia-intubated-then transferred to [facility].   3/14>> extubated  Significant studies: 3/12>> Echo: EF 65-70% 3/13>> chest x-ray: Minimal left basilar atelectasis 3/13>> MRI brain: Acute left frontal infarct 3/14>> carotid Doppler: Right ICA 1-39% stenosis, left ICA 40-59% stenosis 3/14>> MRI brain: No large vessel occlusion 3/14>> LDL: 105 3/14>> A1c: 5.9  Antimicrobial therapy: None  Microbiology data: None  Procedures : 3/13-3/14>> ETT 3/14>> EEG: No seizures  Consults: Neurology, PCCM  DVT Prophylaxis : enoxaparin (LOVENOX) injection 40 mg Start: 10/21/20 2000    Subjective: Lying comfortably in bed-denies any chest pain.  Refused CPAP last night.  Assessment/Plan: Acute metabolic encephalopathy: Thought to be due to hypercarbia per prior notes.  She is completely awake and alert.  Refuses CPAP/BiPAP-understands risk of recurrent hypercarbia.  Acute hypoxic and hypercarbic respiratory failure: Thought to be due to OSA/OHS/COPD and some component of pulmonary edema due to  diastolic heart failure.  Continue supportive care-does not want BiPAP/CPAP nightly.  Minimize sedating medications as much as possible.  Acute diastolic heart failure: Clinically euvolemic  Acute CVA: Nonfocal exam-work-up as above-continue aspirin.  No further recommendations from neurology.  COPD: No wheezing-continue bronchodilators  HLD: Continue statin  Morbid Obesity: Estimated body mass index is 41.9 kg/m as calculated from the following:   Height as of this encounter: 4' 11.75" (1.518 m).   Weight as of this encounter: 96.5 kg.    Diet: Diet Order            Diet Heart Room service appropriate? Yes; Fluid consistency: Thin  Diet effective now                  Code Status: Full code   Family Communication: We will update family over the next few days-prior MD spoke to them on 3/15   Disposition Plan: Status is: Inpatient  Remains inpatient appropriate because:Inpatient level of care appropriate due to severity of illness   Dispo: The patient is from: Home              Anticipated d/c is to: CIR              Patient currently is medically stable to d/c.   Difficult to place patient No   Barriers to Discharge: Await CIR placement  Antimicrobial agents: Anti-infectives (From admission, onward)   None       Time spent: 25- minutes-Greater than 50% of this time was spent in counseling, explanation of diagnosis, planning of further management, and coordination of care.  MEDICATIONS: Scheduled Meds: . aspirin EC  81 mg  Oral Daily  . chlorhexidine gluconate (MEDLINE KIT)  15 mL Mouth Rinse BID  . docusate  100 mg Per Tube BID  . enoxaparin (LOVENOX) injection  40 mg Subcutaneous Q24H  . insulin aspart  0-9 Units Subcutaneous Q4H  . simvastatin  5 mg Oral Daily  . sodium chloride flush  3 mL Intravenous Q12H   Continuous Infusions: PRN Meds:.acetaminophen, albuterol, polyethylene glycol   PHYSICAL EXAM: Vital signs: Vitals:   10/25/20 2256  10/26/20 0124 10/26/20 0400 10/26/20 0753  BP: 124/62  130/75 (!) 162/72  Pulse: 90  86 99  Resp: '18  18 20  ' Temp: 98.5 F (36.9 C)  97.8 F (36.6 C) 97.8 F (36.6 C)  TempSrc: Oral  Oral Oral  SpO2: 93%  95% 98%  Weight:  96.5 kg    Height:       Filed Weights   10/23/20 0404 10/25/20 0500 10/26/20 0124  Weight: 96.6 kg 96.2 kg 96.5 kg   Body mass index is 41.9 kg/m.   Gen Exam:Alert awake-not in any distress HEENT:atraumatic, normocephalic Chest: B/L clear to auscultation anteriorly CVS:S1S2 regular Abdomen:soft non tender, non distended Extremities:trace edema Neurology: Non focal Skin: no rash  I have personally reviewed following labs and imaging studies  LABORATORY DATA: CBC: Recent Labs  Lab 10/22/20 0436 10/23/20 0435 10/24/20 0015 10/24/20 0135 10/25/20 0121 10/26/20 0026  WBC 7.8 9.2  --  9.0 10.1 10.4  HGB 13.8 14.0 14.6 13.8 13.6 13.5  HCT 47.9* 47.4* 43.0 45.2 44.0 42.0  MCV 102.4* 102.8*  --  97.6 96.3 93.8  PLT 163 163  --  145* 140* 287    Basic Metabolic Panel: Recent Labs  Lab 10/22/20 2310 10/23/20 0435 10/24/20 0015 10/24/20 0135 10/25/20 0121 10/26/20 0026  NA 140 139 137 138 136 134*  K 5.0 4.8 4.1 4.6 3.7 3.9  CL 94* 92*  --  87* 88* 90*  CO2 33* 36*  --  42* 41* 35*  GLUCOSE 136* 132*  --  108* 119* 122*  BUN 27* 29*  --  28* 21 21  CREATININE 1.10* 1.02*  --  1.11* 0.92 1.08*  CALCIUM 8.8* 8.9  --  9.0 8.7* 8.9  MG 2.3  --   --   --   --   --     GFR: Estimated Creatinine Clearance: 39.4 mL/min (A) (by C-G formula based on SCr of 1.08 mg/dL (H)).  Liver Function Tests: Recent Labs  Lab 10/22/20 2310  AST 21  ALT 26  ALKPHOS 52  BILITOT 0.6  PROT 6.8  ALBUMIN 3.6   No results for input(s): LIPASE, AMYLASE in the last 168 hours. Recent Labs  Lab 10/23/20 1143  AMMONIA 26    Coagulation Profile: No results for input(s): INR, PROTIME in the last 168 hours.  Cardiac Enzymes: No results for input(s):  CKTOTAL, CKMB, CKMBINDEX, TROPONINI in the last 168 hours.  BNP (last 3 results) No results for input(s): PROBNP in the last 8760 hours.  Lipid Profile: Recent Labs    10/24/20 0135  CHOL 169  HDL 47  LDLCALC 105*  TRIG 84  CHOLHDL 3.6    Thyroid Function Tests: Recent Labs    10/23/20 0906  TSH 0.540    Anemia Panel: Recent Labs    10/23/20 0906  VITAMINB12 571  FOLATE 14.9    Urine analysis:    Component Value Date/Time   COLORURINE STRAW (A) 10/21/2020 [place] 10/21/2020 1229  LABSPEC 1.009 10/21/2020 1229   PHURINE 5.0 10/21/2020 1229   GLUCOSEU NEGATIVE 10/21/2020 1229   HGBUR NEGATIVE 10/21/2020 1229   BILIRUBINUR NEGATIVE 10/21/2020 1229   KETONESUR NEGATIVE 10/21/2020 1229   PROTEINUR NEGATIVE 10/21/2020 1229   UROBILINOGEN 0.2 01/27/2009 1018   NITRITE NEGATIVE 10/21/2020 1229   LEUKOCYTESUR NEGATIVE 10/21/2020 1229    Sepsis Labs: Lactic Acid, Venous No results found for: LATICACIDVEN  MICROBIOLOGY: Recent Results (from the past 240 hour(s))  Resp Panel by RT-PCR (Flu A&B, Covid) Nasopharyngeal Swab     Status: None   Collection Time: 10/21/20  9:44 AM   Specimen: Nasopharyngeal Swab; Nasopharyngeal(NP) swabs in vial transport medium  Result Value Ref Range Status   SARS Coronavirus 2 by RT PCR NEGATIVE NEGATIVE Final    Comment: (NOTE) SARS-CoV-2 target nucleic acids are NOT DETECTED.  The SARS-CoV-2 RNA is generally detectable in upper respiratory specimens during the acute phase of infection. The lowest concentration of SARS-CoV-2 viral copies this assay can detect is 138 copies/mL. A negative result does not preclude SARS-Cov-2 infection and should not be used as the sole basis for treatment or other patient management decisions. A negative result may occur with  improper specimen collection/handling, submission of specimen other than nasopharyngeal swab, presence of viral mutation(s) within the areas targeted by  this assay, and inadequate number of viral copies(<138 copies/mL). A negative result must be combined with clinical observations, patient history, and epidemiological information. The expected result is Negative.  Fact Sheet for Patients:  EntrepreneurPulse.com.au  Fact Sheet for Healthcare Providers:  IncredibleEmployment.be  This test is no t yet approved or cleared by the [place] FDA and  has been authorized for detection and/or diagnosis of SARS-CoV-2 by FDA under an Emergency Use Authorization (EUA). This EUA will remain  in effect (meaning this test can be used) for the duration of the COVID-19 declaration under Section 564(b)(1) of the Act, 21 U.S.C.section 360bbb-3(b)(1), unless the authorization is terminated  or revoked sooner.       Influenza A by PCR NEGATIVE NEGATIVE Final   Influenza B by PCR NEGATIVE NEGATIVE Final    Comment: (NOTE) The Xpert Xpress SARS-CoV-2/FLU/RSV plus assay is intended as an aid in the diagnosis of influenza from Nasopharyngeal swab specimens and should not be used as a sole basis for treatment. Nasal washings and aspirates are unacceptable for Xpert Xpress SARS-CoV-2/FLU/RSV testing.  Fact Sheet for Patients: EntrepreneurPulse.com.au  Fact Sheet for Healthcare Providers: IncredibleEmployment.be  This test is not yet approved or cleared by the [place] FDA and has been authorized for detection and/or diagnosis of SARS-CoV-2 by FDA under an Emergency Use Authorization (EUA). This EUA will remain in effect (meaning this test can be used) for the duration of the COVID-19 declaration under Section 564(b)(1) of the Act, 21 U.S.C. section 360bbb-3(b)(1), unless the authorization is terminated or revoked.  Performed at [facility], [place] [address], [place], [place] [address]   MRSA PCR Screening     Status: None   Collection Time: 10/23/20  11:00 PM   Specimen: Nasal Mucosa; Nasopharyngeal  Result Value Ref Range Status   MRSA by PCR NEGATIVE NEGATIVE Final    Comment:        The GeneXpert MRSA Assay (FDA approved for NASAL specimens only), is one component of a comprehensive MRSA colonization surveillance program. It is not intended to diagnose MRSA infection nor to guide or monitor treatment for MRSA infections. Performed at [facility], [place]  [address], [place], [place] [address]     RADIOLOGY STUDIES/RESULTS: EEG adult  Result Date: 10/24/2020 Lora Havens, MD     10/24/2020 10:47 AM Patient Name: DESEREE ZEMAITIS MRN: [id number] Epilepsy Attending: Lora Havens Referring Physician/Provider: Dr. Kerney Elbe Date: 10/24/2020 Duration: 35.44 mins Patient history: 87 year old female with altered mental status and slurred speech with rhythmic movements of both arms.  EEG to evaluate for seizures. Level of alertness: Awake AEDs during EEG study: None Technical aspects: This EEG study was done with scalp electrodes positioned according to the 10-20 International system of electrode placement. Electrical activity was acquired at a sampling rate of '500Hz'  and reviewed with a high frequency filter of '70Hz'  and a low frequency filter of '1Hz' . EEG data were recorded continuously and digitally stored. Description: No clear posterior dominant rhythm was seen.  EEG showed continuous generalized polymorphic mixed frequencies with predominantly 3 to 6 Hz theta-delta slowing.  Patient was also noted to have bilateral hand tremors during the study. Concomitant EEG before, during and after the event did not show any EEG change to suggest seizure.  Hyperventilation and photic stimulation were not performed.   ABNORMALITY -Continuous slow, generalized IMPRESSION: This study is suggestive of moderate diffuse encephalopathy, nonspecific etiology.  Patient was noted to have bilateral hand tremors during the study without concomitant EEG change  and were NOT epileptic. No seizures or definite epileptiform discharges were seen throughout the recording. Priyanka O Yadav   VAS US CAROTID  Result Date: 10/25/2020 Carotid Arterial Duplex Study Indications:       CVA. Risk Factors:      Hypertension, hyperlipidemia. Limitations        Today's exam was limited due to the patient's inability or                    unwillingness to cooperate and the patient's respiratory                    variation. Comparison Study:  no prior Performing Technologist: Abram Sander RVS  Examination Guidelines: A complete evaluation includes B-mode imaging, spectral Doppler, color Doppler, and power Doppler as needed of all accessible portions of each vessel. Bilateral testing is considered an integral part of a complete examination. Limited examinations for reoccurring indications may be performed as noted.  Right Carotid Findings: +----------+--------+--------+--------+-------------------+--------------------+           PSV cm/sEDV cm/sStenosisPlaque Description Comments             +----------+--------+--------+--------+-------------------+--------------------+ CCA Prox  114     24              heterogenous                            +----------+--------+--------+--------+-------------------+--------------------+ CCA Distal108     23              heterogenous and                                                          calcific                                +----------+--------+--------+--------+-------------------+--------------------+ ICA Prox  104  24      1-39%                      limited                                                                   visualization due to                                                      patient movement     +----------+--------+--------+--------+-------------------+--------------------+ ICA Distal                                           Not visualized        +----------+--------+--------+--------+-------------------+--------------------+ ECA       110                                                             +----------+--------+--------+--------+-------------------+--------------------+ +----------+--------+-------+--------+-------------------+           PSV cm/sEDV cmsDescribeArm Pressure (mmHG) +----------+--------+-------+--------+-------------------+ [id number]                                        +----------+--------+-------+--------+-------------------+ +---------+--------+---+--------+--+---------+ VertebralPSV cm/s130EDV cm/s35Antegrade +---------+--------+---+--------+--+---------+  Left Carotid Findings: +----------+--------+--------+--------+-------------------+--------------------+           PSV cm/sEDV cm/sStenosisPlaque Description Comments             +----------+--------+--------+--------+-------------------+--------------------+ CCA Prox  109     31              heterogenous                            +----------+--------+--------+--------+-------------------+--------------------+ CCA Distal89      26              heterogenous and                                                          calcific                                +----------+--------+--------+--------+-------------------+--------------------+ ICA Prox  170     56      40-59%  heterogenous and   Velocities may  calcific           underestimate degree                                                      of stenosis due to                                                        more proximal                                                             obstruction.         +----------+--------+--------+--------+-------------------+--------------------+ ICA Mid   82      27                                                       +----------+--------+--------+--------+-------------------+--------------------+ ICA Distal78      31                                                      +----------+--------+--------+--------+-------------------+--------------------+ ECA       113                                                             +----------+--------+--------+--------+-------------------+--------------------+ +----------+--------+--------+--------+-------------------+           PSV cm/sEDV cm/sDescribeArm Pressure (mmHG) +----------+--------+--------+--------+-------------------+ [id number]                                         +----------+--------+--------+--------+-------------------+ +---------+--------+--+--------+--+---------+ VertebralPSV cm/s84EDV cm/s20Antegrade +---------+--------+--+--------+--+---------+   Summary: Right Carotid: Velocities in the right ICA are consistent with a 1-39% stenosis. Left Carotid: Velocities in the left ICA are consistent with a 40-59% stenosis. Vertebrals: Bilateral vertebral arteries demonstrate antegrade flow. *See table(s) above for measurements and observations.  Electronically signed by Antony Contras MD on 10/25/2020 at 12:50:43 PM.    Final      LOS: 5 days   Oren Binet, MD  [facility]    To contact the attending provider between 7A-7P or the covering provider during after hours 7P-7A, please log into the web site [email or web address] and access using universal [place] password for that web site. If you do not have the password, please call the hospital operator.  10/26/2020, 8:33 AM

## 2020-10-26 NOTE — Discharge Summary (Signed)
PATIENT DETAILS Name: [name] Age: 87 y.o. Sex: female Date of Birth: July 29, 1933 MRN: [id number]. Admitting Physician: Harold Hedge, MD YBF:XOVANV, Gwyndolyn Saxon, MD  Admit Date: 10/21/2020 Discharge date: 10/26/2020  Recommendations for Outpatient Follow-up:  1. Follow up with PCP in 1-2 weeks 2. Please obtain CMP/CBC in one week   Admitted From:  Home   Disposition: [place]: No  Equipment/Devices: None  Discharge Condition: Stable  CODE STATUS: DNI  Diet recommendation:  Diet Order            Diet - low sodium heart healthy           Diet Carb Modified           Diet Heart Room service appropriate? Yes; Fluid consistency: Thin  Diet effective now                  Brief Narrative: Patient is a 87 y.o. female with history of COPD, HTN, HLD presented to [facility] on 3/11 with worsening lower extremity swelling and shortness of breath-she was thought to have acute on chronic diastolic heart failure-hospital course complicated by acute encephalopathy (due to hypercarbia), acute CVA-she was being transferred to [facility] for further evaluation when she developed worsening hypoxia requiring intubation.  She was stabilized in the [place] further stability transfer to the [facility] hospitalist service.  See below for further details.  .  Significant events: 3/11>> admitted to [facility] for volume overload 3/12>> developed confusion/tremors 3/13>> worsening hypoxia-intubated-then transferred to [facility].   3/14>> extubated  Significant studies: 3/12>> Echo: EF 65-70% 3/13>> chest x-ray: Minimal left basilar atelectasis 3/13>> MRI brain: Acute left frontal infarct 3/14>> carotid Doppler: Right ICA 1-39% stenosis, left ICA 40-59% stenosis 3/14>> MRI brain: No large vessel occlusion 3/14>> LDL: 105 3/14>> A1c: 5.9  Antimicrobial therapy: None  Microbiology data: None  Procedures : 3/13-3/14>> ETT 3/14>> EEG: No seizures  Consults: Neurology,  PCCM  Brief Hospital Course: Acute metabolic encephalopathy: Thought to be due to hypercarbia per prior notes.  She is completely awake and alert.  Refuses CPAP/BiPAP-understands risk of recurrent hypercarbia.  Acute hypoxic and hypercarbic respiratory failure: Thought to be due to OSA/OHS/COPD and some component of pulmonary edema due to diastolic heart failure.  Continue supportive care-does not want BiPAP/CPAP nightly.  Minimize sedating medications as much as possible.  Acute diastolic heart failure: Clinically euvolemic-please continue to dose diuretics as needed.  Acute CVA: Nonfocal exam-work-up as above-continue aspirin.  No further recommendations from neurology.  COPD: No wheezing-continue bronchodilators  HLD: Continue statin  Morbid Obesity: Estimated body mass index is 41.9 kg/m as calculated from the following:   Height as of this encounter: 4' 11.75" (1.518 m).   Weight as of this encounter: 96.5 kg.     Discharge Diagnoses:  Principal Problem:   Acute respiratory failure with hypoxia ([place]) Active Problems:   Essential hypertension   Morbid obesity (HCC)   COPD (chronic obstructive pulmonary disease) (HCC)   Hypertensive urgency   Ischemic stroke of frontal lobe (HCC)   Tremors of nervous system   Volume overload   Encephalopathy   Hyponatremia   Supplemental oxygen dependent   Chronic low back pain   Discharge Instructions:  Activity:  As tolerated with Full fall precautions use walker/cane & assistance as needed  Discharge Instructions    Ambulatory referral to Neurology   Complete by: As directed    Diet - low sodium heart healthy   Complete by: As directed  Diet Carb Modified   Complete by: As directed    Increase activity slowly   Complete by: As directed      Allergies as of 10/26/2020      Reactions   Ceftin [cefuroxime] Diarrhea   Clindamycin/lincomycin Itching   Simvastatin    Muscle pain with large dose   Doxycycline Hyclate  Diarrhea, Rash   Penicillins Rash      Medication List    STOP taking these medications   celecoxib 100 MG capsule Commonly known as: CELEBREX   metoprolol succinate 50 MG 24 hr tablet Commonly known as: TOPROL-XL     TAKE these medications   aspirin 81 MG EC tablet Take 1 tablet (81 mg total) by mouth daily. Swallow whole. Start taking on: October 27, 2020   calcium carbonate 1500 (600 Ca) MG Tabs tablet Commonly known as: OSCAL Take 600 mg of elemental calcium by mouth 2 (two) times daily with a meal.   simvastatin 5 MG tablet Commonly known as: ZOCOR Take 5 mg by mouth daily.   VITAMIN D PO Take 1,000 Units by mouth daily.       Follow-up Information    Shirline Frees, MD. Schedule an appointment as soon as possible for a visit in 1 week(s).   Specialty: Family Medicine Contact information: [address], [address] [place] [address]        [facility]. Schedule an appointment as soon as possible for a visit in 4 week(s).   Contact information: [address]     [address] [place] [address] [phone]             Allergies  Allergen Reactions  . Ceftin [Cefuroxime] Diarrhea  . Clindamycin/Lincomycin Itching  . Simvastatin     Muscle pain with large dose  . Doxycycline Hyclate Diarrhea and Rash  . Penicillins Rash     Other Procedures/Studies: CT HEAD WO CONTRAST  Result Date: 10/23/2020 CLINICAL DATA:  87 year old female with unexplained altered mental status, confusion. EXAM: CT HEAD WITHOUT CONTRAST TECHNIQUE: Contiguous axial images were obtained from the base of the skull through the vertex without intravenous contrast. COMPARISON:  None. FINDINGS: Study is intermittently degraded by motion artifact despite repeated imaging attempts. Brain: Cerebral volume is within normal limits for age. No ventriculomegaly. No midline shift, mass effect, or evidence of intracranial mass lesion. No acute  intracranial hemorrhage identified. No cortically based acute infarct identified. No definite cortical encephalomalacia. Allowing for intermittent motion gray-white matter differentiation appears fairly normal for age throughout the brain. Vascular: Calcified atherosclerosis at the skull base. No suspicious intracranial vascular hyperdensity. Skull: Intermittent motion. No acute osseous abnormality identified. Sinuses/Orbits: Visualized paranasal sinuses and mastoids are clear. Other: No acute orbit or scalp soft tissue finding. IMPRESSION: Negative for age noncontrast CT appearance of the brain when allowing for motion degradation despite repeated imaging attempts. Electronically Signed   By: Genevie Ann M.D.   On: 10/23/2020 10:23   MR ANGIO HEAD WO CONTRAST  Result Date: 10/24/2020 CLINICAL DATA:  87 year old female with small acute left centrum semiovale white matter infarct on MRI yesterday. EXAM: MRA HEAD WITHOUT CONTRAST TECHNIQUE: Angiographic images of the Circle of Willis were obtained using MRA technique without intravenous contrast. COMPARISON:  Brain MRI 10/23/2020. FINDINGS: No intracranial mass effect or ventriculomegaly. Antegrade flow in the posterior circulation with dominant distal right vertebral artery. No right vertebral or vertebrobasilar junction stenosis. Non dominant left vertebral artery appears more irregular, a especially near  the vertebrobasilar junction with up to moderate stenosis on series 1037, image 10. Normal PICA origins. Patent basilar artery with tortuosity and mild irregularity. No significant basilar stenosis. SCA and right PCA origins are within normal limits. Fetal type left PCA origin with small left P1. Right posterior communicating artery is diminutive or absent. Mild bilateral PCA irregularity. Mildly tortuous left PCA P2 segment. Antegrade flow in both ICA siphons. Evidence of bilateral siphon calcified plaque. Tortuous cavernous segments. No significant left siphon  stenosis. Mild to moderate supraclinoid right ICA stenosis. Normal ophthalmic and left posterior communicating artery origins. Patent carotid termini. Patent MCA and ACA origins. Dominant left ACA A1 segment. Anterior communicating artery and visible ACA branches are within normal limits. MCA M1 segments and bifurcations are patent without stenosis. Tortuous right M1. Visible bilateral MCA branches are within normal limits. IMPRESSION: 1. Negative for large vessel occlusion, positive for widespread intracranial atherosclerosis. 2. Up to moderate stenosis of the Left Vertebrobasilar junction, supraclinoid Right ICA. Electronically Signed   By: Genevie Ann M.D.   On: 10/24/2020 07:02   MR BRAIN WO CONTRAST  Result Date: 10/23/2020 CLINICAL DATA:  Acute confusion with tremors. EXAM: MRI HEAD WITHOUT CONTRAST TECHNIQUE: Multiplanar, multiecho pulse sequences of the brain and surrounding structures were obtained without intravenous contrast. COMPARISON:  Head CT 10/23/2020 FINDINGS: Multiple sequences are moderately motion degraded. Brain: There is a subcentimeter acute infarct in the posterior left frontal white matter at the level of the centrum semiovale. No intracranial hemorrhage, mass, midline shift, or extra-axial fluid collection is identified. T2 hyperintensities in the cerebral white matter and pons are nonspecific but compatible with mild chronic small vessel ischemic disease. The ventricles and sulci are within normal limits for age. Vascular: Major intracranial vascular flow voids are preserved. Skull and upper cervical spine: Unremarkable bone marrow signal. Sinuses/Orbits: Bilateral cataract extraction. Bilateral proptosis. Paranasal sinuses and mastoid air cells are clear. Other: None. IMPRESSION: 1. Small acute left frontal white matter infarct. 2. Mild chronic small vessel ischemic disease. Electronically Signed   By: Logan Bores M.D.   On: 10/23/2020 15:07   DG Chest Port 1 View  Result Date:  10/23/2020 CLINICAL DATA:  Intubated, CVA EXAM: PORTABLE CHEST 1 VIEW COMPARISON:  10/23/2020 FINDINGS: Single frontal view of the chest demonstrates endotracheal tube overlying tracheal air column, tip approximately 2 cm above carina. Cardiac silhouette is unremarkable. Patchy consolidation at the left lung base unchanged, likely atelectasis. No effusion or pneumothorax. No acute bony abnormalities. IMPRESSION: 1. Minimal left basilar consolidation, favor atelectasis, stable. 2. Unremarkable endotracheal tube. Electronically Signed   By: Randa Ngo M.D.   On: 10/23/2020 23:09   DG Chest Port 1 View  Result Date: 10/23/2020 CLINICAL DATA:  Post intubation EXAM: PORTABLE CHEST 1 VIEW COMPARISON:  10/21/2020 FINDINGS: Interval intubation, tip of the endotracheal tube about 1.7 cm superior to the carina. Patchy airspace disease at the left base. No pleural effusion. Normal heart size. Aortic atherosclerosis. No pneumothorax. IMPRESSION: Endotracheal tube tip about 1.7 cm superior to the carina. Patchy airspace disease at the left base, atelectasis versus minimal pneumonia versus aspiration Electronically Signed   By: Donavan Foil M.D.   On: 10/23/2020 22:08   DG Chest Port 1 View  Result Date: 10/21/2020 CLINICAL DATA:  Shortness of breath and bilateral lower extremity swelling. EXAM: PORTABLE CHEST 1 VIEW COMPARISON:  PA and lateral chest 08/16/2016. FINDINGS: The lungs are clear. Heart size is normal. No pneumothorax or pleural effusion. Aortic atherosclerosis is noted. No  acute or focal bony abnormality. IMPRESSION: No acute disease. Aortic Atherosclerosis (ICD10-I70.0). Electronically Signed   By: Inge Rise M.D.   On: 10/21/2020 09:51   EEG adult  Result Date: 10/24/2020 Lora Havens, MD     10/24/2020 10:47 AM Patient Name: [name] MRN: [id number] Epilepsy Attending: Lora Havens Referring Physician/Provider: Dr. Kerney Elbe Date: 10/24/2020 Duration: 35.44 mins Patient  history: 87 year old female with altered mental status and slurred speech with rhythmic movements of both arms.  EEG to evaluate for seizures. Level of alertness: Awake AEDs during EEG study: None Technical aspects: This EEG study was done with scalp electrodes positioned according to the 10-20 International system of electrode placement. Electrical activity was acquired at a sampling rate of _0  and reviewed with a high frequency filter of _1  and a low frequency filter of _2 . EEG data were recorded continuously and digitally stored. Description: No clear posterior dominant rhythm was seen.  EEG showed continuous generalized polymorphic mixed frequencies with predominantly 3 to 6 Hz theta-delta slowing.  Patient was also noted to have bilateral hand tremors during the study. Concomitant EEG before, during and after the event did not show any EEG change to suggest seizure.  Hyperventilation and photic stimulation were not performed.   ABNORMALITY -Continuous slow, generalized IMPRESSION: This study is suggestive of moderate diffuse encephalopathy, nonspecific etiology.  Patient was noted to have bilateral hand tremors during the study without concomitant EEG change and were NOT epileptic. No seizures or definite epileptiform discharges were seen throughout the recording. Lora Havens   ECHOCARDIOGRAM COMPLETE  Result Date: 10/22/2020    ECHOCARDIOGRAM REPORT   Patient Name:   [name] Date of Exam: 10/22/2020 Medical Rec #:  [id number]     Height:       59.8 in Accession #:    [id number]    Weight:       208.0 lb Date of Birth:  1933/03/26     BSA:          1.893 m Patient Age:    58 years      BP:           136/89 mmHg Patient Gender: F             HR:           84 bpm. Exam Location:  Inpatient Procedure: 2D Echo, Cardiac Doppler and Color Doppler Indications:    R06.02 SOB  History:        Patient has prior history of Echocardiogram examinations. COPD,                 Signs/Symptoms:Shortness of  Breath and Dyspnea; Risk                 Factors:Hypertension and Former Smoker. Hypoxia. Edema.  Sonographer:    Roseanna Rainbow RDCS Referring Phys: [id number] Harold Hedge  Sonographer Comments: Technically difficult study due to poor echo windows and patient is morbidly obese. Image acquisition challenging due to patient body habitus and Image acquisition challenging due to COPD. Difficult images, patient shaking during exam causing interrupted EKG. Patient seems confused at times and had to be repeatedly reminded to try not to talk. IMPRESSIONS  1. Left ventricular ejection fraction, by estimation, is 65 to 70%. The left ventricle has normal function. Left ventricular endocardial border not optimally defined to evaluate regional wall motion. There is mild concentric left ventricular hypertrophy. Left ventricular diastolic parameters are indeterminate.  2. Right  ventricular systolic function is normal. The right ventricular size is normal. Tricuspid regurgitation signal is inadequate for assessing PA pressure.  3. The mitral valve is degenerative. No evidence of mitral valve regurgitation. No evidence of mitral stenosis.  4. The aortic valve was not well visualized. Aortic valve regurgitation is not visualized. No aortic stenosis is present.  5. The inferior vena cava is dilated in size with >50% respiratory variability, suggesting right atrial pressure of 8 mmHg. FINDINGS  Left Ventricle: Left ventricular ejection fraction, by estimation, is 65 to 70%. The left ventricle has normal function. Left ventricular endocardial border not optimally defined to evaluate regional wall motion. The left ventricular internal cavity size was normal in size. There is mild concentric left ventricular hypertrophy. Left ventricular diastolic parameters are indeterminate. Right Ventricle: The right ventricular size is normal. No increase in right ventricular wall thickness. Right ventricular systolic function is normal. Tricuspid  regurgitation signal is inadequate for assessing PA pressure. Left Atrium: Left atrial size was normal in size. Right Atrium: Right atrial size was normal in size. Pericardium: There is no evidence of pericardial effusion. Mitral Valve: The mitral valve is degenerative in appearance. Mild mitral annular calcification. No evidence of mitral valve regurgitation. No evidence of mitral valve stenosis. Tricuspid Valve: The tricuspid valve is grossly normal. Tricuspid valve regurgitation is not demonstrated. No evidence of tricuspid stenosis. Aortic Valve: The aortic valve was not well visualized. Aortic valve regurgitation is not visualized. No aortic stenosis is present. Pulmonic Valve: The pulmonic valve was not well visualized. Pulmonic valve regurgitation is not visualized. No evidence of pulmonic stenosis. Aorta: The aortic root and ascending aorta are structurally normal, with no evidence of dilitation. Venous: The inferior vena cava is dilated in size with greater than 50% respiratory variability, suggesting right atrial pressure of 8 mmHg. IAS/Shunts: The atrial septum is grossly normal.  LEFT VENTRICLE PLAX 2D LVIDd:         4.40 cm     Diastology LVIDs:         3.00 cm     LV e' medial:    6.31 cm/s LV PW:         1.15 cm     LV E/e' medial:  17.1 LV IVS:        1.10 cm     LV e' lateral:   7.62 cm/s LVOT diam:     1.90 cm     LV E/e' lateral: 14.2 LV SV:         62 LV SV Index:   33 LVOT Area:     2.84 cm  LV Volumes (MOD) LV vol d, MOD A2C: 33.6 ml LV vol d, MOD A4C: 37.3 ml LV vol s, MOD A2C: 10.2 ml LV vol s, MOD A4C: 10.3 ml LV SV MOD A2C:     23.4 ml LV SV MOD A4C:     37.3 ml LV SV MOD BP:      24.9 ml RIGHT VENTRICLE             IVC RV S prime:     12.50 cm/s  IVC diam: 2.30 cm TAPSE (M-mode): 2.0 cm LEFT ATRIUM             Index       RIGHT ATRIUM           Index LA diam:        3.30 cm 1.74 cm/m  RA Area:     14.80 cm LA  Vol [facility]):   20.3 ml 10.73 ml/m RA Volume:   36.70 ml  19.39 ml/m LA Vol  (A4C):   24.3 ml 12.84 ml/m LA Biplane Vol: 22.6 ml 11.94 ml/m  AORTIC VALVE LVOT Vmax:   123.00 cm/s LVOT Vmean:  73.400 cm/s LVOT VTI:    0.219 m  AORTA Ao Root diam: 2.70 cm Ao Asc diam:  2.90 cm MITRAL VALVE MV Area (PHT): 4.68 cm     SHUNTS MV Decel Time: 162 msec     Systemic VTI:  0.22 m MV E velocity: 108.00 cm/s  Systemic Diam: 1.90 cm MV A velocity: 112.00 cm/s MV E/A ratio:  0.96 Eleonore Chiquito MD Electronically signed by Eleonore Chiquito MD Signature Date/Time: 10/22/2020/1:07:10 PM    Final    VAS US CAROTID  Result Date: 10/25/2020 Carotid Arterial Duplex Study Indications:       CVA. Risk Factors:      Hypertension, hyperlipidemia. Limitations        Today's exam was limited due to the patient's inability or                    unwillingness to cooperate and the patient's respiratory                    variation. Comparison Study:  no prior Performing Technologist: Abram Sander RVS  Examination Guidelines: A complete evaluation includes B-mode imaging, spectral Doppler, color Doppler, and power Doppler as needed of all accessible portions of each vessel. Bilateral testing is considered an integral part of a complete examination. Limited examinations for reoccurring indications may be performed as noted.  Right Carotid Findings: +----------+--------+--------+--------+-------------------+--------------------+           PSV cm/sEDV cm/sStenosisPlaque Description Comments             +----------+--------+--------+--------+-------------------+--------------------+ CCA Prox  114     24              heterogenous                            +----------+--------+--------+--------+-------------------+--------------------+ CCA Distal108     23              heterogenous and                                                          calcific                                +----------+--------+--------+--------+-------------------+--------------------+ ICA Prox  104     24      1-39%                       limited                                                                   visualization due to  patient movement     +----------+--------+--------+--------+-------------------+--------------------+ ICA Distal                                           Not visualized       +----------+--------+--------+--------+-------------------+--------------------+ ECA       110                                                             +----------+--------+--------+--------+-------------------+--------------------+ +----------+--------+-------+--------+-------------------+           PSV cm/sEDV cmsDescribeArm Pressure (mmHG) +----------+--------+-------+--------+-------------------+ [id number]                                        +----------+--------+-------+--------+-------------------+ +---------+--------+---+--------+--+---------+ VertebralPSV cm/s130EDV cm/s35Antegrade +---------+--------+---+--------+--+---------+  Left Carotid Findings: +----------+--------+--------+--------+-------------------+--------------------+           PSV cm/sEDV cm/sStenosisPlaque Description Comments             +----------+--------+--------+--------+-------------------+--------------------+ CCA Prox  109     31              heterogenous                            +----------+--------+--------+--------+-------------------+--------------------+ CCA Distal89      26              heterogenous and                                                          calcific                                +----------+--------+--------+--------+-------------------+--------------------+ ICA Prox  170     56      40-59%  heterogenous and   Velocities may                                         calcific           underestimate degree                                                      of  stenosis due to                                                        more proximal  obstruction.         +----------+--------+--------+--------+-------------------+--------------------+ ICA Mid   82      27                                                      +----------+--------+--------+--------+-------------------+--------------------+ ICA Distal78      31                                                      +----------+--------+--------+--------+-------------------+--------------------+ ECA       113                                                             +----------+--------+--------+--------+-------------------+--------------------+ +----------+--------+--------+--------+-------------------+           PSV cm/sEDV cm/sDescribeArm Pressure (mmHG) +----------+--------+--------+--------+-------------------+ [id number]                                         +----------+--------+--------+--------+-------------------+ +---------+--------+--+--------+--+---------+ VertebralPSV cm/s84EDV cm/s20Antegrade +---------+--------+--+--------+--+---------+   Summary: Right Carotid: Velocities in the right ICA are consistent with a 1-39% stenosis. Left Carotid: Velocities in the left ICA are consistent with a 40-59% stenosis. Vertebrals: Bilateral vertebral arteries demonstrate antegrade flow. *See table(s) above for measurements and observations.  Electronically signed by Antony Contras MD on 10/25/2020 at 12:50:43 PM.    Final    VAS [place] LOWER EXTREMITY VENOUS (DVT) (ONLY [facility] & [facility])  Result Date: 10/22/2020  Lower Venous DVT Study Indications: Swelling.  Risk Factors: None identified. Limitations: Body habitus and poor ultrasound/tissue interface. Comparison Study: No prior studies. Performing Technologist: Oliver Hum RVT  Examination Guidelines: A complete evaluation includes B-mode imaging, spectral  Doppler, color Doppler, and power Doppler as needed of all accessible portions of each vessel. Bilateral testing is considered an integral part of a complete examination. Limited examinations for reoccurring indications may be performed as noted. The reflux portion of the exam is performed with the patient in reverse Trendelenburg.  +---------+---------------+---------+-----------+----------+--------------+ RIGHT    CompressibilityPhasicitySpontaneityPropertiesThrombus Aging +---------+---------------+---------+-----------+----------+--------------+ CFV      Full           Yes      Yes                                 +---------+---------------+---------+-----------+----------+--------------+ SFJ      Full                                                        +---------+---------------+---------+-----------+----------+--------------+ FV Prox  Full                                                        +---------+---------------+---------+-----------+----------+--------------+  FV Mid   Full                                                        +---------+---------------+---------+-----------+----------+--------------+ FV DistalFull                                                        +---------+---------------+---------+-----------+----------+--------------+ PFV      Full                                                        +---------+---------------+---------+-----------+----------+--------------+ POP      Full           Yes      Yes                                 +---------+---------------+---------+-----------+----------+--------------+ PTV      Full                                                        +---------+---------------+---------+-----------+----------+--------------+ PERO     Full                                                        +---------+---------------+---------+-----------+----------+--------------+    +---------+---------------+---------+-----------+----------+--------------+ LEFT     CompressibilityPhasicitySpontaneityPropertiesThrombus Aging +---------+---------------+---------+-----------+----------+--------------+ CFV      Full           Yes      Yes                                 +---------+---------------+---------+-----------+----------+--------------+ SFJ      Full                                                        +---------+---------------+---------+-----------+----------+--------------+ FV Prox  Full                                                        +---------+---------------+---------+-----------+----------+--------------+ FV Mid   Full                                                        +---------+---------------+---------+-----------+----------+--------------+   FV Distal               Yes      Yes                                 +---------+---------------+---------+-----------+----------+--------------+ PFV      Full                                                        +---------+---------------+---------+-----------+----------+--------------+ POP      Full           Yes      Yes                                 +---------+---------------+---------+-----------+----------+--------------+ PTV      Full                                                        +---------+---------------+---------+-----------+----------+--------------+ PERO     Full                                                        +---------+---------------+---------+-----------+----------+--------------+     Summary: RIGHT: - There is no evidence of deep vein thrombosis in the lower extremity. However, portions of this examination were limited- see technologist comments above.  - No cystic structure found in the popliteal fossa.  LEFT: - There is no evidence of deep vein thrombosis in the lower extremity. However, portions of this examination were  limited- see technologist comments above.  - No cystic structure found in the popliteal fossa.  *See table(s) above for measurements and observations. Electronically signed by Harold Barban MD on 10/22/2020 at 4:19:18 PM.    Final      TODAY-DAY OF DISCHARGE:  Subjective:   [name] today has no headache,no chest abdominal pain,no new weakness tingling or numbness, feels much better wants to go home today.   Objective:   Blood pressure (!) 143/63, pulse 100, temperature 98.4 F (36.9 C), temperature source Oral, resp. rate 18, height 4' 11.75" (1.518 m), weight 96.5 kg, SpO2 98 %.  Intake/Output Summary (Last 24 hours) at 10/26/2020 1534 Last data filed at 10/26/2020 1046 Gross per 24 hour  Intake 240 ml  Output -  Net 240 ml   Filed Weights   10/23/20 0404 10/25/20 0500 10/26/20 0124  Weight: 96.6 kg 96.2 kg 96.5 kg    Exam: Awake Alert, Oriented *3, No new F.N deficits, Normal affect .AT,PERRAL Supple Neck,No JVD, No cervical lymphadenopathy appriciated.  Symmetrical Chest wall movement, Good air movement bilaterally, CTAB RRR,No Gallops,Rubs or new Murmurs, No Parasternal Heave +ve B.Sounds, Abd Soft, Non tender, No organomegaly appriciated, No rebound -guarding or rigidity. No Cyanosis, Clubbing or edema, No new Rash or bruise   PERTINENT RADIOLOGIC STUDIES: No results found.   PERTINENT LAB RESULTS: CBC: Recent Labs  10/25/20 0121 10/26/20 0026  WBC 10.1 10.4  HGB 13.6 13.5  HCT 44.0 42.0  PLT 140* 164   CMET CMP     Component Value Date/Time   NA 134 (L) 10/26/2020 0026   K 3.9 10/26/2020 0026   CL 90 (L) 10/26/2020 0026   CO2 35 (H) 10/26/2020 0026   GLUCOSE 122 (H) 10/26/2020 0026   BUN 21 10/26/2020 0026   CREATININE 1.08 (H) 10/26/2020 0026   CALCIUM 8.9 10/26/2020 0026   PROT 6.8 10/22/2020 2310   ALBUMIN 3.6 10/22/2020 2310   AST 21 10/22/2020 2310   ALT 26 10/22/2020 2310   ALKPHOS 52 10/22/2020 2310   BILITOT 0.6 10/22/2020 2310    GFRNONAA 50 (L) 10/26/2020 0026   GFRAA  02/04/2009 0345    >60        The eGFR has been calculated using the MDRD equation. This calculation has not been validated in all clinical situations. eGFR's persistently <60 mL/min signify possible Chronic Kidney Disease.    GFR Estimated Creatinine Clearance: 39.4 mL/min (A) (by C-G formula based on SCr of 1.08 mg/dL (H)). No results for input(s): LIPASE, AMYLASE in the last 72 hours. No results for input(s): CKTOTAL, CKMB, CKMBINDEX, TROPONINI in the last 72 hours. Invalid input(s): POCBNP No results for input(s): DDIMER in the last 72 hours. Recent Labs    10/24/20 0135  HGBA1C 5.9*   Recent Labs    10/24/20 0135  CHOL 169  HDL 47  LDLCALC 105*  TRIG 84  CHOLHDL 3.6   No results for input(s): TSH, T4TOTAL, T3FREE, THYROIDAB in the last 72 hours.  Invalid input(s): FREET3 No results for input(s): VITAMINB12, FOLATE, FERRITIN, TIBC, IRON, RETICCTPCT in the last 72 hours. Coags: No results for input(s): INR in the last 72 hours.  Invalid input(s): PT Microbiology: Recent Results (from the past 240 hour(s))  Resp Panel by RT-PCR (Flu A&B, Covid) Nasopharyngeal Swab     Status: None   Collection Time: 10/21/20  9:44 AM   Specimen: Nasopharyngeal Swab; Nasopharyngeal(NP) swabs in vial transport medium  Result Value Ref Range Status   SARS Coronavirus 2 by RT PCR NEGATIVE NEGATIVE Final    Comment: (NOTE) SARS-CoV-2 target nucleic acids are NOT DETECTED.  The SARS-CoV-2 RNA is generally detectable in upper respiratory specimens during the acute phase of infection. The lowest concentration of SARS-CoV-2 viral copies this assay can detect is 138 copies/mL. A negative result does not preclude SARS-Cov-2 infection and should not be used as the sole basis for treatment or other patient management decisions. A negative result may occur with  improper specimen collection/handling, submission of specimen other than  nasopharyngeal swab, presence of viral mutation(s) within the areas targeted by this assay, and inadequate number of viral copies(<138 copies/mL). A negative result must be combined with clinical observations, patient history, and epidemiological information. The expected result is Negative.  Fact Sheet for Patients:  EntrepreneurPulse.com.au  Fact Sheet for Healthcare Providers:  IncredibleEmployment.be  This test is no t yet approved or cleared by the [place] FDA and  has been authorized for detection and/or diagnosis of SARS-CoV-2 by FDA under an Emergency Use Authorization (EUA). This EUA will remain  in effect (meaning this test can be used) for the duration of the COVID-19 declaration under Section 564(b)(1) of the Act, 21 U.S.C.section 360bbb-3(b)(1), unless the authorization is terminated  or revoked sooner.       Influenza A by PCR NEGATIVE NEGATIVE Final   Influenza B by  PCR NEGATIVE NEGATIVE Final    Comment: (NOTE) The Xpert Xpress SARS-CoV-2/FLU/RSV plus assay is intended as an aid in the diagnosis of influenza from Nasopharyngeal swab specimens and should not be used as a sole basis for treatment. Nasal washings and aspirates are unacceptable for Xpert Xpress SARS-CoV-2/FLU/RSV testing.  Fact Sheet for Patients: EntrepreneurPulse.com.au  Fact Sheet for Healthcare Providers: IncredibleEmployment.be  This test is not yet approved or cleared by the [place] FDA and has been authorized for detection and/or diagnosis of SARS-CoV-2 by FDA under an Emergency Use Authorization (EUA). This EUA will remain in effect (meaning this test can be used) for the duration of the COVID-19 declaration under Section 564(b)(1) of the Act, 21 U.S.C. section 360bbb-3(b)(1), unless the authorization is terminated or revoked.  Performed at [facility], [place] [address] [place], [place] [address]   MRSA PCR Screening     Status: None   Collection Time: 10/23/20 11:00 PM   Specimen: Nasal Mucosa; Nasopharyngeal  Result Value Ref Range Status   MRSA by PCR NEGATIVE NEGATIVE Final    Comment:        The GeneXpert MRSA Assay (FDA approved for NASAL specimens only), is one component of a comprehensive MRSA colonization surveillance program. It is not intended to diagnose MRSA infection nor to guide or monitor treatment for MRSA infections. Performed at [facility], [place] [address], [place], [place] [address]     FURTHER DISCHARGE INSTRUCTIONS:  Get Medicines reviewed and adjusted: Please take all your medications with you for your next visit with your Primary MD  Laboratory/radiological data: Please request your Primary MD to go over all hospital tests and procedure/radiological results at the follow up, please ask your Primary MD to get all Hospital records sent to his/her office.  In some cases, they will be blood work, cultures and biopsy results pending at the time of your discharge. Please request that your primary care M.D. goes through all the records of your hospital data and follows up on these results.  Also Note the following: If you experience worsening of your admission symptoms, develop shortness of breath, life threatening emergency, suicidal or homicidal thoughts you must seek medical attention immediately by calling 911 or calling your MD immediately  if symptoms less severe.  You must read complete instructions/literature along with all the possible adverse reactions/side effects for all the Medicines you take and that have been prescribed to you. Take any new Medicines after you have completely understood and accpet all the possible adverse reactions/side effects.   Do not drive when taking Pain medications or sleeping medications (Benzodaizepines)  Do not take more than prescribed Pain, Sleep and Anxiety Medications. It is  not advisable to combine anxiety,sleep and pain medications without talking with your primary care practitioner  Special Instructions: If you have smoked or chewed Tobacco  in the last 2 yrs please stop smoking, stop any regular Alcohol  and or any Recreational drug use.  Wear Seat belts while driving.  Please note: You were cared for by a hospitalist during your hospital stay. Once you are discharged, your primary care physician will handle any further medical issues. Please note that NO REFILLS for any discharge medications will be authorized once you are discharged, as it is imperative that you return to your primary care physician (or establish a relationship with a primary care physician if you do not have one) for your post hospital discharge needs so that they can reassess your  need for medications and monitor your lab values.  Total Time spent coordinating discharge including counseling, education and face to face time equals 35 minutes.  SignedOren Binet 10/26/2020 3:34 PM

## 2020-10-26 NOTE — PMR Pre-admission (Addendum)
PMR Admission Coordinator Pre-Admission Assessment   Patient: [name] is an 87 y.o., female MRN: [id number] DOB: 12/05/1932 Height: 4' 11.75" (151.8 cm) Weight: 96.5 kg                                                                                                                                                  Insurance Information HMO:     PPO:      PCP:      IPA:      80/20:      OTHER:  PRIMARY: Medicare A and B      Policy#: [id number]      Subscriber: pt CM Name:       Phone#:      Fax#:  Pre-Cert#: verified online      Employer:  Benefits:  Phone #:      Name:  Eff. Date: 12/12/99 A and B     Deduct: $1556      Out of Pocket Max: n/a      Life Max: n/a  CIR: 100%      SNF: 20 full days Outpatient: 80%     Co-Pay: 20% Home Health: 100%      Co-Pay:  DME: 80%     Co-Pay: 20% Providers:  SECONDARY: [employer]     Policy#: [id number] (Plan F)     Phone#:    Financial Counselor:       Phone#:    The "Data Collection Information Summary" for patients in Inpatient Rehabilitation Facilities with attached "Privacy Act Statement-Health Care Records" was provided and verbally reviewed with: Patient   Emergency Contact Information         Contact Information     Name Relation Home Work Mobile    [name],[name] Sister     [phone]    McQueen,Jessica Friend   [phone]    barns,pamela Relative     [phone]       Current Medical History  Patient Admitting Diagnosis: CVA    History of Present Illness: Pt is an 87 y/o female with PMH of COPD, HTN, and HLD who presented to [facility] on 3/11 with worsening lower extremity swelling and SOB.  Initially thought to have acute on chronic DHF, but with sudden onset or worsening encephalopathy.  She was transferred to [facility] on 3/13 for stroke workup and en-route had rapid decompensation of respiratory status requiring intubation.  She was extubated on 3/14 and is currently tolerating on room air.  MRI showed acute frontal lobe  infarct on the left. Cognition has been improving.  Recommendations were for CPAP but pt refused.  Therapy evaluations were completed and pt was recommended for CIR.     Complete NIHSS TOTAL: 3 Glasgow Coma Scale Score: 15   Past Medical History      Past Medical History:  Diagnosis Date  .   Colon polyp    . COPD (chronic obstructive pulmonary disease) (HCC)      MILD  . Hyperlipidemia    . Hypertension    . OA (osteoarthritis)      OF THE KNEES  . Osteopenia    . Shingles    . Thyroid cyst        Family History  family history includes CAD in her father.   Prior Rehab/Hospitalizations:  Has the patient had prior rehab or hospitalizations prior to admission? Yes   Has the patient had major surgery during 100 days prior to admission? No   Current Medications    Current Facility-Administered Medications:  .  acetaminophen (TYLENOL) tablet 650 mg, 650 mg, Oral, Q4H PRN, Rai, Ripudeep K, MD .  albuterol (PROVENTIL) (2.5 MG/3ML) 0.083% nebulizer solution 2.5 mg, 2.5 mg, Nebulization, Q4H PRN, Agarwala, Ravi, MD .  [COMPLETED] aspirin tablet 325 mg, 325 mg, Oral, Once, 325 mg at 10/23/20 1600 **FOLLOWED BY** aspirin EC tablet 81 mg, 81 mg, Oral, Daily, Rai, Ripudeep K, MD, 81 mg at 10/26/20 0938 .  chlorhexidine gluconate (MEDLINE KIT) (PERIDEX) 0.12 % solution 15 mL, 15 mL, Mouth Rinse, BID, Simpson, Paula B, NP, 15 mL at 10/25/20 2057 .  docusate (COLACE) 50 MG/5ML liquid 100 mg, 100 mg, Per Tube, BID, Simpson, Paula B, NP .  enoxaparin (LOVENOX) injection 40 mg, 40 mg, Subcutaneous, Q24H, Rai, Ripudeep K, MD, 40 mg at 10/25/20 2057 .  insulin aspart (novoLOG) injection 0-9 Units, 0-9 Units, Subcutaneous, Q4H, Simpson, Paula B, NP, 1 Units at 10/26/20 1302 .  polyethylene glycol (MIRALAX / GLYCOLAX) packet 17 g, 17 g, Oral, Daily PRN, Rai, Ripudeep K, MD .  simvastatin (ZOCOR) tablet 5 mg, 5 mg, Oral, Daily, Rai, Ripudeep K, MD, 5 mg at 10/26/20 0939 .  sodium chloride flush (NS)  0.9 % injection 3 mL, 3 mL, Intravenous, Q12H, Rai, Ripudeep K, MD, 3 mL at 10/26/20 0940   Patients Current Diet:     Diet Order                      Diet Heart Room service appropriate? Yes; Fluid consistency: Thin  Diet effective now                      Precautions / Restrictions Precautions Precautions: Fall Precaution Comments: jerking UE/LE Restrictions Weight Bearing Restrictions: No    Has the patient had 2 or more falls or a fall with injury in the past year?No   Prior Activity Level Limited Community (1-2x/wk): driving, using a rollator at baseline, uses the scooter at the grocery store   Prior Functional Level Prior Function Level of Independence: Independent with assistive device(s) Comments: drives; manages finances   Self Care: Did the patient need help bathing, dressing, using the toilet or eating?  Independent   Indoor Mobility: Did the patient need assistance with walking from room to room (with or without device)? Independent   Stairs: Did the patient need assistance with internal or external stairs (with or without device)? Independent   Functional Cognition: Did the patient need help planning regular tasks such as shopping or remembering to take medications? Independent   Home Assistive Devices / Equipment Home Assistive Devices/Equipment: Walker (specify type),Eyeglasses (4 wheeled walker) Home Equipment: Walker - 2 wheels,Shower seat - built in,Grab bars - tub/shower   Prior Device Use: Indicate devices/aids used by the patient prior to current illness, exacerbation or injury? rollator     Current Functional Level Cognition   Overall Cognitive Status: Impaired/Different from baseline Current Attention Level: Selective Orientation Level: Oriented X4 Following Commands: Follows one step commands with increased time,Follows multi-step commands inconsistently,Follows multi-step commands with increased time Safety/Judgement: Decreased awareness of  deficits,Decreased awareness of safety General Comments: improved cognition, continues to require increased time and multimodal cuing, decreased safety awareness with ambulation and sitting    Extremity Assessment (includes Sensation/Coordination)   Upper Extremity Assessment: Difficult to assess due to impaired cognition (Moving BUE equally; difficulty controlling movements due to apparent asterixis; unable to sustain grip on toothetteor washcloth) RUE Deficits / Details: able to grip therapist's fingers well when cued but unable to maintain; grip repeatedly lost from walker handles 2* jerking RUE Coordination: decreased gross motor,decreased fine motor LUE Deficits / Details: able to grip therapist's fingers well when cued but unable to maintain; grip repeatedly lost from walker handles 2* jerking LUE Coordination: decreased gross motor,decreased fine motor  Lower Extremity Assessment: Defer to PT evaluation RLE Deficits / Details: able to weightbear but "spontaneous jerking" causes buckling RLE Coordination: decreased fine motor,decreased gross motor LLE Deficits / Details: able to weightbear but "spontaneous jerking" causes buckling LLE Coordination: decreased fine motor,decreased gross motor     ADLs         Mobility   Overal bed mobility: Needs Assistance Bed Mobility: Supine to Sit,Sit to Supine Supine to sit: Max assist,+2 for physical assistance Sit to supine: Max assist,+2 for physical assistance General bed mobility comments: sitting up in recliner on entry     Transfers   Overall transfer level: Needs assistance Equipment used: Rolling walker (2 wheeled) Transfers: Sit to/from Stand Sit to Stand: Mod assist,+2 safety/equipment,From elevated surface Stand pivot transfers: Mod assist General transfer comment: modA for power up to RW x 3 during session, increased cuing for hand placement, upright posture and posterior pelvic tilt, pt requires modA for sitting back in recliner  in 2 instances pt sits without being close to chair requiring assist to get hips back and down in chair     Ambulation / Gait / Stairs / Wheelchair Mobility   Ambulation/Gait Ambulation/Gait assistance: Mod assist,+2 safety/equipment Gait Distance (Feet): 15 Feet Assistive device: Rolling walker (2 wheeled) Gait Pattern/deviations: Step-through pattern,Decreased step length - right,Decreased step length - left,Shuffle,Trunk flexed General Gait Details: modA with close chair follow for slowed, mildly unsteady gait, requiring increased cuing for upright posture and navigation around obstacles in room. pt reports readiness for ambulation back to bed however before she can turn around she is slumped over the RW secondary to fatigue. Gait velocity: slowed Gait velocity interpretation: <1.31 ft/sec, indicative of household ambulator     Posture / Balance Balance Overall balance assessment: Needs assistance Sitting balance-Leahy Scale: Fair Standing balance support: Bilateral upper extremity supported Standing balance-Leahy Scale: Poor     Special needs/care consideration CPAP and Diabetic management (pre-diabetic)        Previous Home Environment (from acute therapy documentation) Living Arrangements: Alone Available Help at Discharge: Family,Available 24 hours/day Type of Home: House Home Layout: One level Home Access: Level entry,Stairs to enter Entrance Stairs-Number of Steps: 2 steps from garage Bathroom Shower/Tub: Walk-in shower Bathroom Toilet: Standard Bathroom Accessibility: Yes How Accessible: Accessible via walker Home Care Services: No   Discharge Living Setting Plans for Discharge Living Setting: Patient's home,Alone Type of Home at Discharge: House Discharge Home Layout: One level Discharge Home Access: Stairs to enter Entrance Stairs-Rails: None Entrance Stairs-Number of Steps: 1 Discharge Bathroom Shower/Tub:   Other (comment) (walk-in tub) Discharge Bathroom  Toilet: Handicapped height Discharge Bathroom Accessibility: Yes How Accessible: Accessible via walker Does the patient have any problems obtaining your medications?: No   Social/Family/Support Systems Anticipated Caregiver: states she can call in a group of friends to check in on her at d/c and sister confirms this Anticipated Caregiver's Contact Information: sister, Billy [name] [phone] for updates Ability/Limitations of Caregiver: supervision only Caregiver Availability: Intermittent Discharge Plan Discussed with Primary Caregiver: Yes Is Caregiver In Agreement with Plan?: Yes Does Caregiver/Family have Issues with Lodging/Transportation while Pt is in Rehab?: No     Goals Patient/Family Goal for Rehab: PT/OT/SLP supervision  Expected length of stay: 10-14 days Pt/Family Agrees to Admission and willing to participate: Yes Program Orientation Provided & Reviewed with Pt/Caregiver Including Roles  & Responsibilities: Yes     Decrease burden of Care through IP rehab admission: n/a   Possible need for SNF placement upon discharge: Potentially.  If pt does not reach supervision/mod I level she may require SNF.    Patient Condition: This patient's condition remains as documented in the consult dated 3/15, in which the Rehabilitation Physician determined and documented that the patient's condition is appropriate for intensive rehabilitative care in an inpatient rehabilitation facility. Will admit to inpatient rehab today.   Preadmission Screen Completed By:  Caitlin E Warren, PT, DPT 10/26/2020 2:00 PM ______________________________________________________________________   Discussed status with Dr. Keimya Briddell on 10/26/20 at 2:00 PM  and received approval for admission today.   Admission Coordinator:  Caitlin E Warren, PT, DPT time 2:00 PM /Date 10/26/20    

## 2020-10-26 NOTE — Progress Notes (Signed)
PMR Admission Coordinator Pre-Admission Assessment   Patient: [name] is an 87 y.o., female MRN: [id number] DOB: 04/13/1933 Height: 4' 11.75" (151.8 cm) Weight: 96.5 kg                                                                                                                                                  Insurance Information HMO:     PPO:      PCP:      IPA:      80/20:      OTHER:  PRIMARY: Medicare A and B      Policy#: [id number]      Subscriber: pt CM Name:       Phone#:      Fax#:  Pre-Cert#: verified Civil engineer, contracting:  Benefits:  Phone #:      Name:  Eff. Date: 12/12/99 A and B     Deduct: $1556      Out of Pocket Max: n/a      Life Max: n/a  CIR: 100%      SNF: 20 full days Outpatient: 80%     Co-Pay: 20% Home Health: 100%      Co-Pay:  DME: 80%     Co-Pay: 20% Providers:  SECONDARY: [employer]     Policy#: [id number] (Plan F)     Phone#:    Financial Counselor:       Phone#:    The "Data Collection Information Summary" for patients in Inpatient Rehabilitation Facilities with attached "Privacy Act [place] Records" was provided and verbally reviewed with: Patient   Emergency Contact Information         Contact Information     Name Relation Home Work Mobile    [name] Sister     [phone]    [name]   [phone]    [name] Relative     [phone]       Current Medical History  Patient Admitting Diagnosis: CVA    History of Present Illness: Pt is an 87 y/o female with PMH of COPD, HTN, and HLD who presented to [facility] on 3/11 with worsening lower extremity swelling and SOB.  Initially thought to have acute on chronic DHF, but with sudden onset or worsening encephalopathy.  She was transferred to [facility] on 3/13 for stroke workup and en-route had rapid decompensation of respiratory status requiring intubation.  She was extubated on 3/14 and is currently tolerating on room air.  MRI showed acute frontal lobe  infarct on the left. Cognition has been improving.  Recommendations were for CPAP but pt refused.  Therapy evaluations were completed and pt was recommended for CIR.     Complete NIHSS TOTAL: 3 Glasgow Coma Scale Score: 15   Past Medical History      Past Medical History:  Diagnosis Date  .  Colon polyp    . COPD (chronic obstructive pulmonary disease) (HCC)      MILD  . Hyperlipidemia    . Hypertension    . OA (osteoarthritis)      OF THE KNEES  . Osteopenia    . Shingles    . Thyroid cyst        Family History  family history includes CAD in her father.   Prior Rehab/Hospitalizations:  Has the patient had prior rehab or hospitalizations prior to admission? Yes   Has the patient had major surgery during 100 days prior to admission? No   Current Medications    Current Facility-Administered Medications:  .  acetaminophen (TYLENOL) tablet 650 mg, 650 mg, Oral, Q4H PRN, Rai, Ripudeep K, MD .  albuterol (PROVENTIL) (2.5 MG/3ML) 0.083% nebulizer solution 2.5 mg, 2.5 mg, Nebulization, Q4H PRN, Agarwala, Ravi, MD .  [COMPLETED] aspirin tablet 325 mg, 325 mg, Oral, Once, 325 mg at 10/23/20 1600 **FOLLOWED BY** aspirin EC tablet 81 mg, 81 mg, Oral, Daily, Rai, Ripudeep K, MD, 81 mg at 10/26/20 0938 .  chlorhexidine gluconate (MEDLINE KIT) (PERIDEX) 0.12 % solution 15 mL, 15 mL, Mouth Rinse, BID, Simpson, Paula B, NP, 15 mL at 10/25/20 2057 .  docusate (COLACE) 50 MG/5ML liquid 100 mg, 100 mg, Per Tube, BID, Jennelle Human B, NP .  enoxaparin (LOVENOX) injection 40 mg, 40 mg, Subcutaneous, Q24H, Rai, Ripudeep K, MD, 40 mg at 10/25/20 2057 .  insulin aspart (novoLOG) injection 0-9 Units, 0-9 Units, Subcutaneous, Q4H, Jennelle Human B, NP, 1 Units at 10/26/20 1302 .  polyethylene glycol (MIRALAX / GLYCOLAX) packet 17 g, 17 g, Oral, Daily PRN, Rai, Ripudeep K, MD .  simvastatin (ZOCOR) tablet 5 mg, 5 mg, Oral, Daily, Rai, Ripudeep K, MD, 5 mg at 10/26/20 0939 .  sodium chloride flush (NS)  0.9 % injection 3 mL, 3 mL, Intravenous, Q12H, Rai, Ripudeep K, MD, 3 mL at 10/26/20 0940   Patients Current Diet:     Diet Order                      Diet Heart Room service appropriate? Yes; Fluid consistency: Thin  Diet effective now                      Precautions / Restrictions Precautions Precautions: Fall Precaution Comments: jerking UE/LE Restrictions Weight Bearing Restrictions: No    Has the patient had 2 or more falls or a fall with injury in the past year?No   Prior Activity Level Limited Community (1-2x/wk): driving, using a rollator at baseline, uses the scooter at the grocery store   Prior Functional Level Prior Function Level of Independence: Independent with assistive device(s) Comments: drives; manages finances   Self Care: Did the patient need help bathing, dressing, using the toilet or eating?  Independent   Indoor Mobility: Did the patient need assistance with walking from room to room (with or without device)? Independent   Stairs: Did the patient need assistance with internal or external stairs (with or without device)? Independent   Functional Cognition: Did the patient need help planning regular tasks such as shopping or remembering to take medications? [place] / [place] Devices/Equipment: Environmental consultant (specify type),Eyeglasses (4 wheeled walker) Home Equipment: Walker - 2 wheels,Shower seat - built in,Grab bars - tub/shower   Prior Device Use: Indicate devices/aids used by the patient prior to current illness, exacerbation or injury? rollator  Current Functional Level Cognition   Overall Cognitive Status: Impaired/Different from baseline Current Attention Level: Selective Orientation Level: Oriented X4 Following Commands: Follows one step commands with increased time,Follows multi-step commands inconsistently,Follows multi-step commands with increased time Safety/Judgement: Decreased awareness of  deficits,Decreased awareness of safety General Comments: improved cognition, continues to require increased time and multimodal cuing, decreased safety awareness with ambulation and sitting    Extremity Assessment (includes Sensation/Coordination)   Upper Extremity Assessment: Difficult to assess due to impaired cognition (Moving BUE equally; difficulty controlling movements due to apparent asterixis; unable to sustain grip on toothetteor washcloth) RUE Deficits / Details: able to grip therapist's fingers well when cued but unable to maintain; grip repeatedly lost from walker handles 2* jerking RUE Coordination: decreased gross motor,decreased fine motor LUE Deficits / Details: able to grip therapist's fingers well when cued but unable to maintain; grip repeatedly lost from walker handles 2* jerking LUE Coordination: decreased gross motor,decreased fine motor  Lower Extremity Assessment: Defer to PT evaluation RLE Deficits / Details: able to weightbear but "spontaneous jerking" causes buckling RLE Coordination: decreased fine motor,decreased gross motor LLE Deficits / Details: able to weightbear but "spontaneous jerking" causes buckling LLE Coordination: decreased fine motor,decreased gross motor     ADLs         Mobility   Overal bed mobility: Needs Assistance Bed Mobility: Supine to Sit,Sit to Supine Supine to sit: Max assist,+2 for physical assistance Sit to supine: Max assist,+2 for physical assistance General bed mobility comments: sitting up in recliner on entry     Transfers   Overall transfer level: Needs assistance Equipment used: Rolling walker (2 wheeled) Transfers: Sit to/from Stand Sit to Stand: Mod assist,+2 safety/equipment,From elevated surface Stand pivot transfers: Mod assist General transfer comment: modA for power up to RW x 3 during session, increased cuing for hand placement, upright posture and posterior pelvic tilt, pt requires modA for sitting back in recliner  in 2 instances pt sits without being close to chair requiring assist to get hips back and down in chair     Ambulation / Gait / Stairs / Wheelchair Mobility   Ambulation/Gait Ambulation/Gait assistance: Mod assist,+2 safety/equipment Gait Distance (Feet): 15 Feet Assistive device: Rolling walker (2 wheeled) Gait Pattern/deviations: Step-through pattern,Decreased step length - right,Decreased step length - left,Shuffle,Trunk flexed General Gait Details: modA with close chair follow for slowed, mildly unsteady gait, requiring increased cuing for upright posture and navigation around obstacles in room. pt reports readiness for ambulation back to bed however before she can turn around she is slumped over the RW secondary to fatigue. Gait velocity: slowed Gait velocity interpretation: <1.31 ft/sec, indicative of household ambulator     Posture / Balance Balance Overall balance assessment: Needs assistance Sitting balance-Leahy Scale: Fair Standing balance support: Bilateral upper extremity supported Standing balance-Leahy Scale: Poor     Special needs/care consideration CPAP and Diabetic management (pre-diabetic)        Previous Home Environment (from acute therapy documentation) Living Arrangements: Alone Available Help at Discharge: Family,Available 24 hours/day Type of Home: House Home Layout: One level Home Access: Level entry,Stairs to enter Entrance Stairs-Number of Steps: 2 steps from garage Bathroom Shower/Tub: Multimedia programmer: Standard Bathroom Accessibility: Yes How Accessible: Accessible via walker [place]: No   Discharge Living Setting Plans for Discharge Living Setting: Patient's home,Alone Type of Home at Discharge: House Discharge Home Layout: One level Discharge Home Access: Stairs to enter Entrance Stairs-Rails: None Entrance Stairs-Number of Steps: 1 Discharge Bathroom Shower/Tub:  Other (comment) (walk-in tub) Discharge Bathroom  Toilet: Handicapped height Discharge Bathroom Accessibility: Yes How Accessible: Accessible via walker Does the patient have any problems obtaining your medications?: No   Social/Family/Support Systems Anticipated Caregiver: states she can call in a group of friends to check in on her at d/c and sister confirms this Anticipated Caregiver's Contact Information: sister, Moise Boring [phone] for updates Ability/Limitations of Caregiver: supervision only Caregiver Availability: Intermittent Discharge Plan Discussed with Primary Caregiver: Yes Is Caregiver In Agreement with Plan?: Yes Does Caregiver/Family have Issues with Lodging/Transportation while Pt is in Rehab?: No     Goals Patient/Family Goal for Rehab: PT/OT/SLP supervision  Expected length of stay: 10-14 days Pt/Family Agrees to Admission and willing to participate: Yes Program Orientation Provided & Reviewed with Pt/Caregiver Including Roles  & Responsibilities: Yes     Decrease burden of Care through IP rehab admission: n/a   Possible need for SNF placement upon discharge: Potentially.  If pt does not reach supervision/mod I level she may require SNF.    Patient Condition: This patient's condition remains as documented in the consult dated 3/15, in which the Rehabilitation Physician determined and documented that the patient's condition is appropriate for intensive rehabilitative care in an inpatient rehabilitation facility. Will admit to inpatient rehab today.   Preadmission Screen Completed By:  Michel Santee, PT, DPT 10/26/2020 2:00 PM ______________________________________________________________________   Discussed status with Dr. Posey Pronto on 10/26/20 at 2:00 PM  and received approval for admission today.   Admission Coordinator:  Michel Santee, PT, DPT time 2:00 PM Sudie Grumbling 10/26/20

## 2020-10-26 NOTE — Progress Notes (Signed)
Report given to [facility]. Patient transferred to [id number].

## 2020-10-26 NOTE — Progress Notes (Signed)
Patient refused CPAP for the night  

## 2020-10-26 NOTE — Progress Notes (Signed)
Physical Medicine and Rehabilitation Consult     Reason for Consult:  Functional deficits due to encephalopathy/tremors.  Referring Physician: Dr. Wyline Copas     HPI: [name] is a 87 y.o. female with history of COPD, HTN, chronic LBP --occasional ESI/Dr. Nelva Bush, OA who was admitted on 10/21/20 with progressive SOB and LE edema that started after her PCP took her off her diuretic. She was hypoxic at admission and started on IV diuresis as well as supplemental oxygen. She developed lethargy with head tremors and confusion while being evaluated in ED and stat CT head negative for acute changes. MRI/MRA  brain ordered for work up and revealed small acute left frontal white matter infarct with widespread intracranial atherosclerosis and moderate stenosis of L-VB junction and supraclinoid R-ICA. 2D echo showed EF 65-70% with mild concentric LVH and evidence of PAH.  She was transferred to [facility] hospital for work up and intubated/sedated prior to transport.  EEG ordered due to concerns of seizure and showed moderate diffuse encephalopathy--bilateral hand tremors noted without concomitant EEG changes and not felt to be epileptic. She tolerated extubated 03/14 without difficulty and Dr. Leonie Man recommended ASA for "silent small vessel stroke". Therapy evaluations completed and limited by BUE/BLE tremors, with strong posterior bias, lethargy and cognitive deficits with difficulty following one step commands. CIR recommended due to functional decline.    Lives alone--has sister "[name]" who lives in [place] and is 85 years old.  She used to work for [employer] then [employer] admission. Has prior coworker/friends and church members who are like family in town. Per friend in the room--has been getting short of breath/winded with activities in home--has walkers that she leans on. She also has a fall 3-4 weeks ago and had started at looking into moving to [facility].     Decided to not go to assisted living due to cost.  LBM  2 days ago; doesn't live on O2 at home.  Came in due to swelling and SOB- pt doesn't remember CVA or intubation/extubation; also reports poor memory   Review of Systems  Constitutional: Negative for chills and fever.  HENT: Negative for hearing loss and tinnitus.   Eyes: Negative for blurred vision and double vision.  Respiratory: Negative for cough and hemoptysis.   Cardiovascular: Negative for chest pain and leg swelling.  Gastrointestinal: Negative for abdominal pain, constipation, heartburn and nausea.  Genitourinary: Positive for frequency. Negative for dysuria.  Musculoskeletal: Positive for back pain.  Skin: Negative for itching and rash.  Neurological: Positive for weakness. Negative for dizziness, tremors and headaches.  Psychiatric/Behavioral: Positive for memory loss.  All other systems reviewed and are negative.           Past Medical History:  Diagnosis Date  . Colon polyp    . COPD (chronic obstructive pulmonary disease) (HCC)      MILD  . Hyperlipidemia    . Hypertension    . OA (osteoarthritis)      OF THE KNEES  . Osteopenia    . Shingles    . Thyroid cyst             Past Surgical History:  Procedure Laterality Date  . BREAST EXCISIONAL BIOPSY Right      benign           Family History  Problem Relation Age of Onset  . CAD Father    . Breast cancer Neg Hx  Social History:  Lives alone and was independent PTA and active prior to Covid.  She  reports that she has quit smoking years ago.  She has never used smokeless tobacco. She reports that she does not drink alcohol and does not use drugs.           Allergies  Allergen Reactions  . Ceftin [Cefuroxime] Diarrhea  . Clindamycin/Lincomycin Itching  . Simvastatin        Muscle pain with large dose  . Doxycycline Hyclate Diarrhea and Rash  . Penicillins Rash            Medications Prior to Admission  Medication Sig Dispense Refill  . calcium carbonate (OSCAL) 1500 (600 Ca) MG TABS  tablet Take 600 mg of elemental calcium by mouth 2 (two) times daily with a meal.      . celecoxib (CELEBREX) 100 MG capsule Take 100 mg by mouth 2 (two) times daily as needed for mild pain.      . Cholecalciferol (VITAMIN D PO) Take 1,000 Units by mouth daily.      . metoprolol succinate (TOPROL-XL) 50 MG 24 hr tablet Take 75 mg by mouth daily. Taking 1 &1/2 tablet = 75mg  daily      . simvastatin (ZOCOR) 5 MG tablet Take 5 mg by mouth daily.          Home: Home Living Family/patient expects to be discharged to:: Private residence Living Arrangements: Alone Available Help at Discharge: Family,Available 24 hours/day Type of Home: House Home Access: Level entry,Stairs to enter Entrance Stairs-Number of Steps: 2 steps from garage [place]: One level Bathroom Shower/Tub: Multimedia programmer: Standard Bathroom Accessibility: Yes Home Equipment: Environmental consultant - 2 wheels,Shower seat - built in,Grab bars - tub/shower  Functional History: Prior Function Level of Independence: Independent with assistive device(s) Comments: drives; manages finances Functional Status:  Mobility: Bed Mobility Overal bed mobility: Needs Assistance Bed Mobility: Supine to Sit,Sit to Supine Supine to sit: Max assist,+2 for physical assistance Sit to supine: Max assist,+2 for physical assistance General bed mobility comments: max +2 for trunk and LE management, scooting to/from EOB, boost up in bed upon return to supine. VC for sequencing task. Transfers Overall transfer level: Needs assistance Equipment used: Rolling walker (2 wheeled) Transfers: Sit to/from Stand Sit to Stand: Mod assist,+2 safety/equipment Stand pivot transfers: Mod assist General transfer comment: Mod +2 for power up, rise, and steadying upon standing, pt with heavy posterior bias. Able to take x3 lateral steps towards [place] Ambulation/Gait General Gait Details: Nt-unable to safely attempt   ADL:   Cognition: Cognition Overall  Cognitive Status: Impaired/Different from baseline Orientation Level: Oriented to person,Oriented to time,Disoriented to place,Disoriented to situation Cognition Arousal/Alertness: Lethargic Behavior During Therapy: Restless Overall Cognitive Status: Impaired/Different from baseline Area of Impairment: Orientation,Attention,Memory,Following commands,Safety/judgement,Awareness,Problem solving Orientation Level: Disoriented to,Place,Situation,Time Current Attention Level: Focused Memory: Decreased short-term memory Following Commands: Follows one step commands inconsistently,Follows one step commands with increased time Safety/Judgement: Decreased awareness of deficits,Decreased awareness of safety Awareness: Intellectual Problem Solving: Slow processing,Decreased initiation,Difficulty sequencing,Requires verbal cues,Requires tactile cues General Comments: appears to be hallucinating; most likely delirius     Blood pressure (!) 150/75, pulse (!) 106, temperature 98.1 F (36.7 C), temperature source Oral, resp. rate 20, height 4' 11.75" (1.518 m), weight 96.2 kg, SpO2 (!) 81 %. Physical Exam Vitals and nursing note reviewed. Exam conducted with a chaperone present.  Constitutional:      Appearance: Normal appearance. She is obese.  Comments: Audible wheezing at times with productive cough.  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     Comments: Smile equal; tongue midline; O2 by [place] 2L    Right Ear: External ear normal.     Left Ear: External ear normal.     Nose: Congestion present.     Mouth/Throat:     Mouth: Mucous membranes are dry.     Pharynx: Oropharynx is clear. No oropharyngeal exudate.  Eyes:     General:        Right eye: No discharge.        Left eye: No discharge.     Extraocular Movements: Extraocular movements intact.      Conjunctiva/sclera: Conjunctivae normal.  Cardiovascular:     Comments: RRR- no JVD Pulmonary:     Breath sounds: Wheezing present.     Comments: Wheezing in L lower lobe; decreased at bases B/L; adequate air movement in upper lobes, no R/R Abdominal:     Comments: Soft, NT, ND, (+)BS    Musculoskeletal:        General: No swelling.     Cervical back: Normal range of motion and neck supple.     Comments: 5/5 in UEs B/L - except finger abd 5-/5 B/L LEs- HF 5-/5 B/L; otherwise 5/5 B/L   Skin:    Comments: Trace edema to ankles B/L- B/L  Neurological:     Mental Status: She is alert.     Comments: Confused and tangential needing redirection to answer basic questions. Had to redirect but able to answer simple motor commands with cues. Moves all four--no tremors noted Knew came into hospital Friday, that's in [facility]- knew 3/15 with a lot of cues- knew Easter next holiday; but word searching a LOT- couldn't think of names of friends- did with cues- aphasia? Anomia?  Psychiatric:     Comments: A little frustrated with poor memory- appropriate however        Lab Results Last 24 Hours       Results for orders placed or performed during the hospital encounter of 10/21/20 (from the past 24 hour(s))  Glucose, capillary     Status: Abnormal    Collection Time: 10/24/20 11:29 AM  Result Value Ref Range    Glucose-Capillary 118 (H) 70 - 99 mg/dL  Glucose, capillary     Status: Abnormal    Collection Time: 10/24/20  3:58 PM  Result Value Ref Range    Glucose-Capillary 123 (H) 70 - 99 mg/dL  Glucose, capillary     Status: Abnormal    Collection Time: 10/24/20 10:10 PM  Result Value Ref Range    Glucose-Capillary 132 (H) 70 - 99 mg/dL  Basic metabolic panel     Status: Abnormal    Collection Time: 10/25/20  1:21 AM  Result Value Ref Range    Sodium 136 135 - 145 mmol/L    Potassium 3.7 3.5 - 5.1 mmol/L    Chloride 88 (L) 98 - 111 mmol/L    CO2 41 (H) 22 - 32 mmol/L     Glucose, Bld 119 (H) 70 - 99 mg/dL    BUN 21 8 - 23 mg/dL    Creatinine, Ser 0.92 0.44 - 1.00 mg/dL    Calcium 8.7 (L) 8.9 - 10.3 mg/dL    GFR, Estimated >60 >60 mL/min  Anion gap 7 5 - 15  CBC     Status: Abnormal    Collection Time: 10/25/20  1:21 AM  Result Value Ref Range    WBC 10.1 4.0 - 10.5 K/uL    RBC 4.57 3.87 - 5.11 MIL/uL    Hemoglobin 13.6 12.0 - 15.0 g/dL    HCT 44.0 36.0 - 46.0 %    MCV 96.3 80.0 - 100.0 fL    MCH 29.8 26.0 - 34.0 pg    MCHC 30.9 30.0 - 36.0 g/dL    RDW 12.7 11.5 - 15.5 %    Platelets 140 (L) 150 - 400 K/uL    nRBC 0.0 0.0 - 0.2 %  Glucose, capillary     Status: Abnormal    Collection Time: 10/25/20  7:25 AM  Result Value Ref Range    Glucose-Capillary 112 (H) 70 - 99 mg/dL       Imaging Results (Last 48 hours)  CT HEAD WO CONTRAST   Result Date: 10/23/2020 CLINICAL DATA:  87 year old female with unexplained altered mental status, confusion. EXAM: CT HEAD WITHOUT CONTRAST TECHNIQUE: Contiguous axial images were obtained from the base of the skull through the vertex without intravenous contrast. COMPARISON:  None. FINDINGS: Study is intermittently degraded by motion artifact despite repeated imaging attempts. Brain: Cerebral volume is within normal limits for age. No ventriculomegaly. No midline shift, mass effect, or evidence of intracranial mass lesion. No acute intracranial hemorrhage identified. No cortically based acute infarct identified. No definite cortical encephalomalacia. Allowing for intermittent motion gray-white matter differentiation appears fairly normal for age throughout the brain. Vascular: Calcified atherosclerosis at the skull base. No suspicious intracranial vascular hyperdensity. Skull: Intermittent motion. No acute osseous abnormality identified. Sinuses/Orbits: Visualized paranasal sinuses and mastoids are clear. Other: No acute orbit or scalp soft tissue finding. IMPRESSION: Negative for age noncontrast CT appearance of the brain  when allowing for motion degradation despite repeated imaging attempts. Electronically Signed   By: Genevie Ann M.D.   On: 10/23/2020 10:23    MR ANGIO HEAD WO CONTRAST   Result Date: 10/24/2020 CLINICAL DATA:  87 year old female with small acute left centrum semiovale white matter infarct on MRI yesterday. EXAM: MRA HEAD WITHOUT CONTRAST TECHNIQUE: Angiographic images of the Circle of Willis were obtained using MRA technique without intravenous contrast. COMPARISON:  Brain MRI 10/23/2020. FINDINGS: No intracranial mass effect or ventriculomegaly. Antegrade flow in the posterior circulation with dominant distal right vertebral artery. No right vertebral or vertebrobasilar junction stenosis. Non dominant left vertebral artery appears more irregular, a especially near the vertebrobasilar junction with up to moderate stenosis on series 1037, image 10. Normal PICA origins. Patent basilar artery with tortuosity and mild irregularity. No significant basilar stenosis. SCA and right PCA origins are within normal limits. Fetal type left PCA origin with small left P1. Right posterior communicating artery is diminutive or absent. Mild bilateral PCA irregularity. Mildly tortuous left PCA P2 segment. Antegrade flow in both ICA siphons. Evidence of bilateral siphon calcified plaque. Tortuous cavernous segments. No significant left siphon stenosis. Mild to moderate supraclinoid right ICA stenosis. Normal ophthalmic and left posterior communicating artery origins. Patent carotid termini. Patent MCA and ACA origins. Dominant left ACA A1 segment. Anterior communicating artery and visible ACA branches are within normal limits. MCA M1 segments and bifurcations are patent without stenosis. Tortuous right M1. Visible bilateral MCA branches are within normal limits. IMPRESSION: 1. Negative for large vessel occlusion, positive for widespread intracranial atherosclerosis. 2. Up to moderate stenosis of the  Left Vertebrobasilar junction,  supraclinoid Right ICA. Electronically Signed   By: Genevie Ann M.D.   On: 10/24/2020 07:02    MR BRAIN WO CONTRAST   Result Date: 10/23/2020 CLINICAL DATA:  Acute confusion with tremors. EXAM: MRI HEAD WITHOUT CONTRAST TECHNIQUE: Multiplanar, multiecho pulse sequences of the brain and surrounding structures were obtained without intravenous contrast. COMPARISON:  Head CT 10/23/2020 FINDINGS: Multiple sequences are moderately motion degraded. Brain: There is a subcentimeter acute infarct in the posterior left frontal white matter at the level of the centrum semiovale. No intracranial hemorrhage, mass, midline shift, or extra-axial fluid collection is identified. T2 hyperintensities in the cerebral white matter and pons are nonspecific but compatible with mild chronic small vessel ischemic disease. The ventricles and sulci are within normal limits for age. Vascular: Major intracranial vascular flow voids are preserved. Skull and upper cervical spine: Unremarkable bone marrow signal. Sinuses/Orbits: Bilateral cataract extraction. Bilateral proptosis. Paranasal sinuses and mastoid air cells are clear. Other: None. IMPRESSION: 1. Small acute left frontal white matter infarct. 2. Mild chronic small vessel ischemic disease. Electronically Signed   By: Logan Bores M.D.   On: 10/23/2020 15:07    DG Chest Port 1 View   Result Date: 10/23/2020 CLINICAL DATA:  Intubated, CVA EXAM: PORTABLE CHEST 1 VIEW COMPARISON:  10/23/2020 FINDINGS: Single frontal view of the chest demonstrates endotracheal tube overlying tracheal air column, tip approximately 2 cm above carina. Cardiac silhouette is unremarkable. Patchy consolidation at the left lung base unchanged, likely atelectasis. No effusion or pneumothorax. No acute bony abnormalities. IMPRESSION: 1. Minimal left basilar consolidation, favor atelectasis, stable. 2. Unremarkable endotracheal tube. Electronically Signed   By: Randa Ngo M.D.   On: 10/23/2020 23:09    DG  Chest Port 1 View   Result Date: 10/23/2020 CLINICAL DATA:  Post intubation EXAM: PORTABLE CHEST 1 VIEW COMPARISON:  10/21/2020 FINDINGS: Interval intubation, tip of the endotracheal tube about 1.7 cm superior to the carina. Patchy airspace disease at the left base. No pleural effusion. Normal heart size. Aortic atherosclerosis. No pneumothorax. IMPRESSION: Endotracheal tube tip about 1.7 cm superior to the carina. Patchy airspace disease at the left base, atelectasis versus minimal pneumonia versus aspiration Electronically Signed   By: Donavan Foil M.D.   On: 10/23/2020 22:08    EEG adult   Result Date: 10/24/2020 Lora Havens, MD     10/24/2020 10:47 AM Patient Name: CARIZMA DUNSWORTH MRN: [id number] Epilepsy Attending: Lora Havens Referring Physician/Provider: Dr. Kerney Elbe Date: 10/24/2020 Duration: 35.44 mins Patient history: 87 year old female with altered mental status and slurred speech with rhythmic movements of both arms.  EEG to evaluate for seizures. Level of alertness: Awake AEDs during EEG study: None Technical aspects: This EEG study was done with scalp electrodes positioned according to the 10-20 International system of electrode placement. Electrical activity was acquired at a sampling rate of 500Hz  and reviewed with a high frequency filter of 70Hz  and a low frequency filter of 1Hz . EEG data were recorded continuously and digitally stored. Description: No clear posterior dominant rhythm was seen.  EEG showed continuous generalized polymorphic mixed frequencies with predominantly 3 to 6 Hz theta-delta slowing.  Patient was also noted to have bilateral hand tremors during the study. Concomitant EEG before, during and after the event did not show any EEG change to suggest seizure.  Hyperventilation and photic stimulation were not performed.   ABNORMALITY -Continuous slow, generalized IMPRESSION: This study is suggestive of moderate diffuse encephalopathy, nonspecific etiology.  Patient  was noted to have bilateral hand tremors during the study without concomitant EEG change and were NOT epileptic. No seizures or definite epileptiform discharges were seen throughout the recording. Priyanka O Yadav    VAS US CAROTID   Result Date: 10/24/2020 Carotid Arterial Duplex Study Indications:       CVA. Risk Factors:      Hypertension, hyperlipidemia. Limitations        Today's exam was limited due to the patient's inability or                    unwillingness to cooperate and the patient's respiratory                    variation. Comparison Study:  no prior Performing Technologist: Abram Sander RVS  Examination Guidelines: A complete evaluation includes B-mode imaging, spectral Doppler, color Doppler, and power Doppler as needed of all accessible portions of each vessel. Bilateral testing is considered an integral part of a complete examination. Limited examinations for reoccurring indications may be performed as noted.  Right Carotid Findings: +----------+--------+--------+--------+-------------------+--------------------+           PSV cm/sEDV cm/sStenosisPlaque Description Comments             +----------+--------+--------+--------+-------------------+--------------------+ CCA Prox  114     24              heterogenous                            +----------+--------+--------+--------+-------------------+--------------------+ CCA Distal108     23              heterogenous and                                                          calcific                                +----------+--------+--------+--------+-------------------+--------------------+ ICA Prox  104     24      1-39%                      limited                                                                   visualization due to                                                      patient movement     +----------+--------+--------+--------+-------------------+--------------------+ ICA  Distal                                           Not visualized       +----------+--------+--------+--------+-------------------+--------------------+  ECA       110                                                             +----------+--------+--------+--------+-------------------+--------------------+ +----------+--------+-------+--------+-------------------+           PSV cm/sEDV cmsDescribeArm Pressure (mmHG) +----------+--------+-------+--------+-------------------+ [id number]                                        +----------+--------+-------+--------+-------------------+ +---------+--------+---+--------+--+---------+ VertebralPSV cm/s130EDV cm/s35Antegrade +---------+--------+---+--------+--+---------+  Left Carotid Findings: +----------+--------+--------+--------+-------------------+--------------------+           PSV cm/sEDV cm/sStenosisPlaque Description Comments             +----------+--------+--------+--------+-------------------+--------------------+ CCA Prox  109     31              heterogenous                            +----------+--------+--------+--------+-------------------+--------------------+ CCA Distal89      26              heterogenous and                                                          calcific                                +----------+--------+--------+--------+-------------------+--------------------+ ICA Prox  170     56      40-59%  heterogenous and   Velocities may                                         calcific           underestimate degree                                                      of stenosis due to                                                        more proximal                                                             obstruction.         +----------+--------+--------+--------+-------------------+--------------------+ ICA Mid   82      27                                                       +----------+--------+--------+--------+-------------------+--------------------+  ICA Distal78      31                                                      +----------+--------+--------+--------+-------------------+--------------------+ ECA       113                                                             +----------+--------+--------+--------+-------------------+--------------------+ +----------+--------+--------+--------+-------------------+           PSV cm/sEDV cm/sDescribeArm Pressure (mmHG) +----------+--------+--------+--------+-------------------+ [id number]                                         +----------+--------+--------+--------+-------------------+ +---------+--------+--+--------+--+---------+ VertebralPSV cm/s84EDV cm/s20Antegrade +---------+--------+--+--------+--+---------+   Summary: Right Carotid: Velocities in the right ICA are consistent with a 1-39% stenosis. Left Carotid: Velocities in the left ICA are consistent with a 40-59% stenosis. Vertebrals: Bilateral vertebral arteries demonstrate antegrade flow. *See table(s) above for measurements and observations.     Preliminary          Assessment/Plan: Diagnosis: L frontal white matter stroke aND ENCEPHALOPATHY  1. Does the need for close, 24 hr/day medical supervision in concert with the patient's rehab needs make it unreasonable for this patient to be served in a less intensive setting? Yes 2. Co-Morbidities requiring supervision/potential complications: LE edema- improved, DOE/SOB, encephalopathy; HTN, chronic LBP- COPD 3. Due to bladder management, bowel management, safety, skin/wound care, disease management, medication administration, pain management and patient education, does the patient require 24 hr/day rehab nursing? Yes 4. Does the patient require coordinated care of a physician, rehab nurse, therapy disciplines of PT< OT and SLP to address  physical and functional deficits in the context of the above medical diagnosis(es)? Yes Addressing deficits in the following areas: balance, endurance, locomotion, strength, transferring, bowel/bladder control, bathing, dressing, feeding, grooming, toileting, cognition, language and psychosocial support 5. Can the patient actively participate in an intensive therapy program of at least 3 hrs of therapy per day at least 5 days per week? Yes 6. The potential for patient to make measurable gains while on inpatient rehab is good 7. Anticipated functional outcomes upon discharge from inpatient rehab are supervision and min assist  with PT, supervision and min assist with OT, supervision with SLP. 8. Estimated rehab length of stay to reach the above functional goals is: 7-10 days- due to very strong 9. Anticipated discharge destination: Home 10. Overall Rehab/Functional Prognosis: good   RECOMMENDATIONS: This patient's condition is appropriate for continued rehabilitative care in the following setting: CIR Patient has agreed to participate in recommended program. Yes and Potentially Note that insurance prior authorization may be required for reimbursement for recommended care.   Comment:  1. Suggest flutter valve scheduled and albuterol prn 2. Hasn't eaten a lot, but if no BM by tomorrow, suggest KUB vs increased bowel meds.  3. Think pt appropriate for inpt CIR- for 7-10 days or so- goals MIN A- Supervision 4. Thank you for consult.    Bary Leriche, PA-C 10/25/2020      I have personally performed  a face to face diagnostic evaluation of this patient and formulated the key components of the plan.  Additionally, I have personally reviewed laboratory data, imaging studies, as well as relevant notes and concur with the physician assistant's documentation above.

## 2020-10-26 NOTE — H&P (Signed)
Physical Medicine and Rehabilitation Admission H&P    Chief Complaint  Patient presents with  . Functional deficits due to stroke  . Hypoxia/hypercarbia    HPI: [name]. Henegar is an 87 year old female with history of HTN, COPD?,  OA bilateral knees, chronic back pain who was admitted on 10/22/2018 increasing shortness of breath and lower extremity edema that started after her PCP took her off diuretics  (due to complaints of frequency).  History taken from chart review and patient. She was hypoxic at admission and was started on IV diuresis as well as supplemental oxygen.  She developed lethargy with head tremors and confusion while being evaluated in ED and stat CT head was unremarkable for acute intracranial process. MRI/MRA brain revealed acute small left frontal white matter infarct with widespread intracranial atherosclerosis and moderate stenosis of L-VV junction and supraclinoid right-ICA.  Echocardiogram with ejection fraction of 65 to 70%, mild concentric LVH and evidence of PAH.  She was transferred to [facility] in for work-up and intubated and sedated prior to transport.  EEG done due to concerns of seizures start and showed moderate diffuse encephalopathy and bilateral hand tremors noted during the study without concomitant EEG changes. She tolerated extubation the next day and respiratory status stable on supplemental oxygen.  Bilateral lower extremities negative for DVT.  Carotid dopplers showed left 40-59% stenosis.  Dr. Leonie Man felt that stroke due to small vessel disease, low-dose ASA added and stroke likely clinically silent.  She has had issues with confusion and ABG done showing evidence of hypercarbia-CO2 74.8, pH 7.48 and bicarb 49. BIPAP ordered for support but patient been refusing this.  She continues to require supplemental oxygen due to hypoxia as well as intermittent tachypnea with RR up to 30's. Therapy ongoing and patient limited by endurance, fatigue, unsteady  gait, difficulty following commands as well as intermittent confusion. CIR recommended due to functional decline.  Please see preadmission assessment from earlier today as well.   Review of Systems  Constitutional: Negative for chills and fever.  HENT: Negative for hearing loss and tinnitus.   Eyes: Negative for blurred vision and double vision.  Respiratory: Negative for cough and shortness of breath.   Cardiovascular: Positive for leg swelling. Negative for chest pain and palpitations.  Gastrointestinal: Negative for constipation, heartburn and nausea.  Genitourinary: Negative for dysuria and urgency.  Musculoskeletal: Positive for back pain and falls (3-4 weeks ago. ). Negative for myalgias.  All other systems reviewed and are negative.    Past Medical History:  Diagnosis Date  . Colon polyp   . COPD (chronic obstructive pulmonary disease) (HCC)    MILD  . Hyperlipidemia   . Hypertension   . OA (osteoarthritis)    OF THE KNEES  . Osteopenia   . Shingles   . Thyroid cyst     Past Surgical History:  Procedure Laterality Date  . BREAST EXCISIONAL BIOPSY Right    benign    Family History  Problem Relation Age of Onset  . CAD Father   . Breast cancer Neg Hx      Social History:  Lives alone, was independent PTA --has walkers in the house. She was active prior to Covid. 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 She has an 21 year old sister in [place] and multiple good friends in town.  She  reports that she has quit smoking years ago.  She has never used smokeless tobacco. She reports that  she does not drink alcohol and does not use drugs.     Allergies  Allergen Reactions  . Ceftin [Cefuroxime] Diarrhea  . Clindamycin/Lincomycin Itching  . Simvastatin     Muscle pain with large dose  . Doxycycline Hyclate Diarrhea and Rash  . Penicillins Rash    Medications Prior to Admission  Medication Sig Dispense Refill  . calcium carbonate (OSCAL) 1500 (600 Ca) MG  TABS tablet Take 600 mg of elemental calcium by mouth 2 (two) times daily with a meal.    . celecoxib (CELEBREX) 100 MG capsule Take 100 mg by mouth 2 (two) times daily as needed for mild pain.    . Cholecalciferol (VITAMIN D PO) Take 1,000 Units by mouth daily.    . metoprolol succinate (TOPROL-XL) 50 MG 24 hr tablet Take 75 mg by mouth daily. Taking 1 &1/2 tablet = 75mg  daily    . simvastatin (ZOCOR) 5 MG tablet Take 5 mg by mouth daily.      Drug Regimen Review  Drug regimen was reviewed and remains appropriate with no significant issues identified  Home: Home Living Family/patient expects to be discharged to:: Private residence Living Arrangements: Alone Available Help at Discharge: Family,Available 24 hours/day Type of Home: House Home Access: Level entry,Stairs to enter Entrance Stairs-Number of Steps: 2 steps from garage Home Layout: One level Bathroom Shower/Tub: Multimedia programmer: Standard Bathroom Accessibility: Yes Home Equipment: Environmental consultant - 2 wheels,Shower seat - built in,Grab bars - tub/shower   Functional History: Prior Function Level of Independence: Independent with assistive device(s) Comments: drives; manages finances  Functional Status:  Mobility: Bed Mobility Overal bed mobility: Needs Assistance Bed Mobility: Supine to Sit,Sit to Supine Supine to sit: Max assist,+2 for physical assistance Sit to supine: Max assist,+2 for physical assistance General bed mobility comments: sitting up in recliner on entry Transfers Overall transfer level: Needs assistance Equipment used: Rolling walker (2 wheeled) Transfers: Sit to/from Stand Sit to Stand: Mod assist,+2 safety/equipment,From elevated surface Stand pivot transfers: Mod assist General transfer comment: modA for power up to RW x 3 during session, increased cuing for hand placement, upright posture and posterior pelvic tilt, pt requires modA for sitting back in recliner in 2 instances pt sits  without being close to chair requiring assist to get hips back and down in chair Ambulation/Gait Ambulation/Gait assistance: Mod assist,+2 safety/equipment Gait Distance (Feet): 15 Feet Assistive device: Rolling walker (2 wheeled) Gait Pattern/deviations: Step-through pattern,Decreased step length - right,Decreased step length - left,Shuffle,Trunk flexed General Gait Details: modA with close chair follow for slowed, mildly unsteady gait, requiring increased cuing for upright posture and navigation around obstacles in room. pt reports readiness for ambulation back to bed however before she can turn around she is slumped over the RW secondary to fatigue. Gait velocity: slowed Gait velocity interpretation: <1.31 ft/sec, indicative of household ambulator    ADL:    Cognition: Cognition Overall Cognitive Status: Impaired/Different from baseline Orientation Level: Oriented X4 Cognition Arousal/Alertness: Awake/alert Behavior During Therapy: Flat affect Overall Cognitive Status: Impaired/Different from baseline Area of Impairment: Orientation,Attention,Memory,Following commands,Safety/judgement,Awareness,Problem solving Orientation Level: Disoriented to,Situation,Time Current Attention Level: Selective Memory: Decreased short-term memory Following Commands: Follows one step commands with increased time,Follows multi-step commands inconsistently,Follows multi-step commands with increased time Safety/Judgement: Decreased awareness of deficits,Decreased awareness of safety Awareness: Emergent Problem Solving: Slow processing,Decreased initiation,Difficulty sequencing,Requires verbal cues,Requires tactile cues General Comments: improved cognition, continues to require increased time and multimodal cuing, decreased safety awareness with ambulation and sitting   Blood pressure Marland Kitchen)  162/72, pulse 99, temperature 97.8 F (36.6 C), temperature source Oral, resp. rate 20, height 4' 11.75" (1.518 m),  weight 96.5 kg, SpO2 98 %. Physical Exam Vitals and nursing note reviewed.  Constitutional:      Appearance: She is obese.     Comments: Up in chair. On 2 L oxygen per Vaughn. Occasional DOE with conversation.   HENT:     Head: Normocephalic and atraumatic.     Right Ear: External ear normal.     Left Ear: External ear normal.     Nose: Nose normal.  Eyes:     General:        Right eye: No discharge.        Left eye: No discharge.     Extraocular Movements: Extraocular movements intact.  Cardiovascular:     Rate and Rhythm: Normal rate and regular rhythm.  Pulmonary:     Effort: Pulmonary effort is normal. No respiratory distress.  Abdominal:     General: Abdomen is flat. Bowel sounds are normal. There is no distension.  Musculoskeletal:        General: Swelling (1+ pedall and pretib) present. No tenderness.     Cervical back: Normal range of motion and neck supple.  Skin:    General: Skin is warm and dry.  Neurological:     Mental Status: She is alert and oriented to person, place, and time.     Comments: Alert and oriented to day, year, month with minimal cues.  Motor: 4+/5 throughout  Psychiatric:        Behavior: Behavior is slowed.        Cognition and Memory: Cognition is impaired.     Results for orders placed or performed during the hospital encounter of 10/21/20 (from the past 48 hour(s))  Glucose, capillary     Status: Abnormal   Collection Time: 10/24/20 11:29 AM  Result Value Ref Range   Glucose-Capillary 118 (H) 70 - 99 mg/dL    Comment: Glucose reference range applies only to samples taken after fasting for at least 8 hours.  Glucose, capillary     Status: Abnormal   Collection Time: 10/24/20  3:58 PM  Result Value Ref Range   Glucose-Capillary 123 (H) 70 - 99 mg/dL    Comment: Glucose reference range applies only to samples taken after fasting for at least 8 hours.  Glucose, capillary     Status: Abnormal   Collection Time: 10/24/20 10:10 PM  Result Value  Ref Range   Glucose-Capillary 132 (H) 70 - 99 mg/dL    Comment: Glucose reference range applies only to samples taken after fasting for at least 8 hours.  Basic metabolic panel     Status: Abnormal   Collection Time: 10/25/20  1:21 AM  Result Value Ref Range   Sodium 136 135 - 145 mmol/L   Potassium 3.7 3.5 - 5.1 mmol/L   Chloride 88 (L) 98 - 111 mmol/L   CO2 41 (H) 22 - 32 mmol/L   Glucose, Bld 119 (H) 70 - 99 mg/dL    Comment: Glucose reference range applies only to samples taken after fasting for at least 8 hours.   BUN 21 8 - 23 mg/dL   Creatinine, Ser 0.92 0.44 - 1.00 mg/dL   Calcium 8.7 (L) 8.9 - 10.3 mg/dL   GFR, Estimated >60 >60 mL/min    Comment: (NOTE) Calculated using the CKD-EPI Creatinine Equation (2021)    Anion gap 7 5 - 15  Comment: Performed at [facility], [place] [address] [place], [place] [address]  CBC     Status: Abnormal   Collection Time: 10/25/20  1:21 AM  Result Value Ref Range   WBC 10.1 4.0 - 10.5 K/uL   RBC 4.57 3.87 - 5.11 MIL/uL   Hemoglobin 13.6 12.0 - 15.0 g/dL   HCT 44.0 36.0 - 46.0 %   MCV 96.3 80.0 - 100.0 fL   MCH 29.8 26.0 - 34.0 pg   MCHC 30.9 30.0 - 36.0 g/dL   RDW 12.7 11.5 - 15.5 %   Platelets 140 (L) 150 - 400 K/uL   nRBC 0.0 0.0 - 0.2 %    Comment: Performed at [facility], [place] [address] [place], [place] [address]  Glucose, capillary     Status: Abnormal   Collection Time: 10/25/20  7:25 AM  Result Value Ref Range   Glucose-Capillary 112 (H) 70 - 99 mg/dL    Comment: Glucose reference range applies only to samples taken after fasting for at least 8 hours.  Glucose, capillary     Status: Abnormal   Collection Time: 10/25/20 11:59 AM  Result Value Ref Range   Glucose-Capillary 152 (H) 70 - 99 mg/dL    Comment: Glucose reference range applies only to samples taken after fasting for at least 8 hours.  Glucose, capillary     Status: Abnormal   Collection Time: 10/25/20  5:01 PM  Result Value Ref Range    Glucose-Capillary 150 (H) 70 - 99 mg/dL    Comment: Glucose reference range applies only to samples taken after fasting for at least 8 hours.  Glucose, capillary     Status: Abnormal   Collection Time: 10/25/20  7:01 PM  Result Value Ref Range   Glucose-Capillary 120 (H) 70 - 99 mg/dL    Comment: Glucose reference range applies only to samples taken after fasting for at least 8 hours.  Basic metabolic panel     Status: Abnormal   Collection Time: 10/26/20 12:26 AM  Result Value Ref Range   Sodium 134 (L) 135 - 145 mmol/L   Potassium 3.9 3.5 - 5.1 mmol/L   Chloride 90 (L) 98 - 111 mmol/L   CO2 35 (H) 22 - 32 mmol/L   Glucose, Bld 122 (H) 70 - 99 mg/dL    Comment: Glucose reference range applies only to samples taken after fasting for at least 8 hours.   BUN 21 8 - 23 mg/dL   Creatinine, Ser 1.08 (H) 0.44 - 1.00 mg/dL   Calcium 8.9 8.9 - 10.3 mg/dL   GFR, Estimated 50 (L) >60 mL/min    Comment: (NOTE) Calculated using the CKD-EPI Creatinine Equation (2021)    Anion gap 9 5 - 15    Comment: Performed at [facility] [address] [place], [place] [address]  CBC     Status: None   Collection Time: 10/26/20 12:26 AM  Result Value Ref Range   WBC 10.4 4.0 - 10.5 K/uL   RBC 4.48 3.87 - 5.11 MIL/uL   Hemoglobin 13.5 12.0 - 15.0 g/dL   HCT 42.0 36.0 - 46.0 %   MCV 93.8 80.0 - 100.0 fL   MCH 30.1 26.0 - 34.0 pg   MCHC 32.1 30.0 - 36.0 g/dL   RDW 13.0 11.5 - 15.5 %   Platelets 164 150 - 400 K/uL   nRBC 0.0 0.0 - 0.2 %    Comment: Performed at [facility], [address], [place], [place] [address]  Glucose, capillary     Status: Abnormal   Collection Time: 10/26/20  7:56 AM  Result Value Ref Range   Glucose-Capillary 110 (H) 70 - 99 mg/dL    Comment: Glucose reference range applies only to samples taken after fasting for at least 8 hours.   VAS US CAROTID  Result Date: 10/25/2020 Carotid Arterial Duplex Study Indications:       CVA. Risk Factors:      Hypertension,  hyperlipidemia. Limitations        Today's exam was limited due to the patient's inability or                    unwillingness to cooperate and the patient's respiratory                    variation. Comparison Study:  no prior Performing Technologist: Abram Sander RVS  Examination Guidelines: A complete evaluation includes B-mode imaging, spectral Doppler, color Doppler, and power Doppler as needed of all accessible portions of each vessel. Bilateral testing is considered an integral part of a complete examination. Limited examinations for reoccurring indications may be performed as noted.  Right Carotid Findings: +----------+--------+--------+--------+-------------------+--------------------+           PSV cm/sEDV cm/sStenosisPlaque Description Comments             +----------+--------+--------+--------+-------------------+--------------------+ CCA Prox  114     24              heterogenous                            +----------+--------+--------+--------+-------------------+--------------------+ CCA Distal108     23              heterogenous and                                                          calcific                                +----------+--------+--------+--------+-------------------+--------------------+ ICA Prox  104     24      1-39%                      limited                                                                   visualization due to                                                      patient movement     +----------+--------+--------+--------+-------------------+--------------------+ ICA Distal  Not visualized       +----------+--------+--------+--------+-------------------+--------------------+ ECA       110                                                             +----------+--------+--------+--------+-------------------+--------------------+  +----------+--------+-------+--------+-------------------+           PSV cm/sEDV cmsDescribeArm Pressure (mmHG) +----------+--------+-------+--------+-------------------+ [id number]                                        +----------+--------+-------+--------+-------------------+ +---------+--------+---+--------+--+---------+ VertebralPSV cm/s130EDV cm/s35Antegrade +---------+--------+---+--------+--+---------+  Left Carotid Findings: +----------+--------+--------+--------+-------------------+--------------------+           PSV cm/sEDV cm/sStenosisPlaque Description Comments             +----------+--------+--------+--------+-------------------+--------------------+ CCA Prox  109     31              heterogenous                            +----------+--------+--------+--------+-------------------+--------------------+ CCA Distal89      26              heterogenous and                                                          calcific                                +----------+--------+--------+--------+-------------------+--------------------+ ICA Prox  170     56      40-59%  heterogenous and   Velocities may                                         calcific           underestimate degree                                                      of stenosis due to                                                        more proximal                                                             obstruction.         +----------+--------+--------+--------+-------------------+--------------------+ ICA Mid  82      27                                                      +----------+--------+--------+--------+-------------------+--------------------+ ICA Distal78      31                                                      +----------+--------+--------+--------+-------------------+--------------------+ ECA       113                                                              +----------+--------+--------+--------+-------------------+--------------------+ +----------+--------+--------+--------+-------------------+           PSV cm/sEDV cm/sDescribeArm Pressure (mmHG) +----------+--------+--------+--------+-------------------+ [id number]                                         +----------+--------+--------+--------+-------------------+ +---------+--------+--+--------+--+---------+ VertebralPSV cm/s84EDV cm/s20Antegrade +---------+--------+--+--------+--+---------+   Summary: Right Carotid: Velocities in the right ICA are consistent with a 1-39% stenosis. Left Carotid: Velocities in the left ICA are consistent with a 40-59% stenosis. Vertebrals: Bilateral vertebral arteries demonstrate antegrade flow. *See table(s) above for measurements and observations.  Electronically signed by Antony Contras MD on 10/25/2020 at 12:50:43 PM.    Final        Medical Problem List and Plan: 1.  Decrease in endurance, fatigue, unsteady gait, difficulty following commands, intermittent confusion secondary to encephalopathy.  -patient may shower  -ELOS/Goals: 10 to 14 days/supervision.  Admit to CIR 2.  Antithrombotics: -DVT/anticoagulation:  Pharmaceutical: Lovenox  -antiplatelet therapy: ASA 3. Chronic LBP/Pain Management: N/A---used Celebrex prn.  --gets ESI by Dr. Nelva Bush  Monitor with increased exertion 4. Mood: LCSW to follow for evaluation and support.   -antipsychotic agents: N/A 5. Neuropsych: This patient is not fully capable of making decisions on her  own behalf. 6. Skin/Wound Care: Routine pressure relief measures.  7. Fluids/Electrolytes/Nutrition: Strict I/Os. Low salt diet.  CMP ordered for tomorrow a.m. 8. Fluid overload: Off lasix  --had stopped HCTZ due to frequency.   Daily weights 9. Hypercarbic respiratory failure/OSA?/COPD?: H/o pulmonary nodules with cavitation due to PNA in 2007?  --stopped  using inhalers due to cough. Not on MDI at home.    --Continues to have hypoxia--dropped to mid 70's this am on RA-->wean supplemental oxygen as tolerated  --encourage pulmonary hygiene. 10. Hyponatremia: Likely due to diuresis.  CMP ordered for tomorrow AM 11. Morbid obesity: Encourage weight loss  Bary Leriche, PA-C 10/26/2020  I have personally performed a face to face diagnostic evaluation, including, but not limited to relevant history and physical exam findings, of this patient and developed relevant assessment and plan.  Additionally, I have reviewed and concur with the physician assistant's documentation above.  Delice Lesch, MD, ABPMR  The patient's status has not changed. Any changes from the pre-admission screening or documentation from the acute chart are noted above.  Delice Lesch, MD, ABPMR

## 2020-10-26 NOTE — H&P (Addendum)
Physical Medicine and Rehabilitation Admission H&P    Chief Complaint  Patient presents with  . Functional deficits due to stroke  . Hypoxia/hypercarbia    HPI: [name] is an 87 year old female with history of HTN, COPD?,  OA bilateral knees, chronic back pain who was admitted on 10/22/2018 increasing shortness of breath and lower extremity edema that started after her PCP took her off diuretics  (due to complaints of frequency).  History taken from chart review and patient. She was hypoxic at admission and was started on IV diuresis as well as supplemental oxygen.  She developed lethargy with head tremors and confusion while being evaluated in ED and stat CT head was unremarkable for acute intracranial process. MRI/MRA brain revealed acute small left frontal white matter infarct with widespread intracranial atherosclerosis and moderate stenosis of L-VV junction and supraclinoid right-ICA.  Echocardiogram with ejection fraction of 65 to 70%, mild concentric LVH and evidence of PAH.  She was transferred to [facility] in for work-up and intubated and sedated prior to transport.  EEG done due to concerns of seizures start and showed moderate diffuse encephalopathy and bilateral hand tremors noted during the study without concomitant EEG changes. She tolerated extubation the next day and respiratory status stable on supplemental oxygen.  Bilateral lower extremities negative for DVT.  Carotid dopplers showed left 40-59% stenosis.  Dr. Leonie Man felt that stroke due to small vessel disease, low-dose ASA added and stroke likely clinically silent.  She has had issues with confusion and ABG done showing evidence of hypercarbia-CO2 74.8, pH 7.48 and bicarb 49. BIPAP ordered for support but patient been refusing this.  She continues to require supplemental oxygen due to hypoxia as well as intermittent tachypnea with RR up to 30's. Therapy ongoing and patient limited by endurance, fatigue, unsteady  gait, difficulty following commands as well as intermittent confusion. CIR recommended due to functional decline.  Please see preadmission assessment from earlier today as well.   Review of Systems  Constitutional: Negative for chills and fever.  HENT: Negative for hearing loss and tinnitus.   Eyes: Negative for blurred vision and double vision.  Respiratory: Negative for cough and shortness of breath.   Cardiovascular: Positive for leg swelling. Negative for chest pain and palpitations.  Gastrointestinal: Negative for constipation, heartburn and nausea.  Genitourinary: Negative for dysuria and urgency.  Musculoskeletal: Positive for back pain and falls (3-4 weeks ago. ). Negative for myalgias.  All other systems reviewed and are negative.    Past Medical History:  Diagnosis Date  . Colon polyp   . COPD (chronic obstructive pulmonary disease) (HCC)    MILD  . Hyperlipidemia   . Hypertension   . OA (osteoarthritis)    OF THE KNEES  . Osteopenia   . Shingles   . Thyroid cyst     Past Surgical History:  Procedure Laterality Date  . BREAST EXCISIONAL BIOPSY Right    benign    Family History  Problem Relation Age of Onset  . CAD Father   . Breast cancer Neg Hx      Social History:  Lives alone, was independent PTA --has walkers in the house. She was active prior to Covid. 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 She has an 69 year old sister in [place] and multiple good friends in town.  She  reports that she has quit smoking years ago.  She has never used smokeless tobacco. She reports that  she does not drink alcohol and does not use drugs.     Allergies  Allergen Reactions  . Ceftin [Cefuroxime] Diarrhea  . Clindamycin/Lincomycin Itching  . Simvastatin     Muscle pain with large dose  . Doxycycline Hyclate Diarrhea and Rash  . Penicillins Rash    Medications Prior to Admission  Medication Sig Dispense Refill  . calcium carbonate (OSCAL) 1500 (600 Ca) MG  TABS tablet Take 600 mg of elemental calcium by mouth 2 (two) times daily with a meal.    . celecoxib (CELEBREX) 100 MG capsule Take 100 mg by mouth 2 (two) times daily as needed for mild pain.    . Cholecalciferol (VITAMIN D PO) Take 1,000 Units by mouth daily.    . metoprolol succinate (TOPROL-XL) 50 MG 24 hr tablet Take 75 mg by mouth daily. Taking 1 &1/2 tablet = 75mg  daily    . simvastatin (ZOCOR) 5 MG tablet Take 5 mg by mouth daily.      Drug Regimen Review  Drug regimen was reviewed and remains appropriate with no significant issues identified  Home: Home Living Family/patient expects to be discharged to:: Private residence Living Arrangements: Alone Available Help at Discharge: Family,Available 24 hours/day Type of Home: House Home Access: Level entry,Stairs to enter Entrance Stairs-Number of Steps: 2 steps from garage Home Layout: One level Bathroom Shower/Tub: Multimedia programmer: Standard Bathroom Accessibility: Yes Home Equipment: Environmental consultant - 2 wheels,Shower seat - built in,Grab bars - tub/shower   Functional History: Prior Function Level of Independence: Independent with assistive device(s) Comments: drives; manages finances  Functional Status:  Mobility: Bed Mobility Overal bed mobility: Needs Assistance Bed Mobility: Supine to Sit,Sit to Supine Supine to sit: Max assist,+2 for physical assistance Sit to supine: Max assist,+2 for physical assistance General bed mobility comments: sitting up in recliner on entry Transfers Overall transfer level: Needs assistance Equipment used: Rolling walker (2 wheeled) Transfers: Sit to/from Stand Sit to Stand: Mod assist,+2 safety/equipment,From elevated surface Stand pivot transfers: Mod assist General transfer comment: modA for power up to RW x 3 during session, increased cuing for hand placement, upright posture and posterior pelvic tilt, pt requires modA for sitting back in recliner in 2 instances pt sits  without being close to chair requiring assist to get hips back and down in chair Ambulation/Gait Ambulation/Gait assistance: Mod assist,+2 safety/equipment Gait Distance (Feet): 15 Feet Assistive device: Rolling walker (2 wheeled) Gait Pattern/deviations: Step-through pattern,Decreased step length - right,Decreased step length - left,Shuffle,Trunk flexed General Gait Details: modA with close chair follow for slowed, mildly unsteady gait, requiring increased cuing for upright posture and navigation around obstacles in room. pt reports readiness for ambulation back to bed however before she can turn around she is slumped over the RW secondary to fatigue. Gait velocity: slowed Gait velocity interpretation: <1.31 ft/sec, indicative of household ambulator    ADL:    Cognition: Cognition Overall Cognitive Status: Impaired/Different from baseline Orientation Level: Oriented X4 Cognition Arousal/Alertness: Awake/alert Behavior During Therapy: Flat affect Overall Cognitive Status: Impaired/Different from baseline Area of Impairment: Orientation,Attention,Memory,Following commands,Safety/judgement,Awareness,Problem solving Orientation Level: Disoriented to,Situation,Time Current Attention Level: Selective Memory: Decreased short-term memory Following Commands: Follows one step commands with increased time,Follows multi-step commands inconsistently,Follows multi-step commands with increased time Safety/Judgement: Decreased awareness of deficits,Decreased awareness of safety Awareness: Emergent Problem Solving: Slow processing,Decreased initiation,Difficulty sequencing,Requires verbal cues,Requires tactile cues General Comments: improved cognition, continues to require increased time and multimodal cuing, decreased safety awareness with ambulation and sitting   Blood pressure Marland Kitchen)  162/72, pulse 99, temperature 97.8 F (36.6 C), temperature source Oral, resp. rate 20, height 4' 11.75" (1.518 m),  weight 96.5 kg, SpO2 98 %. Physical Exam Vitals and nursing note reviewed.  Constitutional:      Appearance: She is obese.     Comments: Up in chair. On 2 L oxygen per [place]. Occasional DOE with conversation.   HENT:     Head: Normocephalic and atraumatic.     Right Ear: External ear normal.     Left Ear: External ear normal.     Nose: Nose normal.  Eyes:     General:        Right eye: No discharge.        Left eye: No discharge.     Extraocular Movements: Extraocular movements intact.  Cardiovascular:     Rate and Rhythm: Normal rate and regular rhythm.  Pulmonary:     Effort: Pulmonary effort is normal. No respiratory distress.  Abdominal:     General: Abdomen is flat. Bowel sounds are normal. There is no distension.  Musculoskeletal:        General: Swelling (1+ pedall and pretib) present. No tenderness.     Cervical back: Normal range of motion and neck supple.  Skin:    General: Skin is warm and dry.  Neurological:     Mental Status: She is alert and oriented to person, place, and time.     Comments: Alert and oriented to day, year, month with minimal cues.  Motor: 4+/5 throughout  Psychiatric:        Behavior: Behavior is slowed.        Cognition and Memory: Cognition is impaired.     Results for orders placed or performed during the hospital encounter of 10/21/20 (from the past 48 hour(s))  Glucose, capillary     Status: Abnormal   Collection Time: 10/24/20 11:29 AM  Result Value Ref Range   Glucose-Capillary 118 (H) 70 - 99 mg/dL    Comment: Glucose reference range applies only to samples taken after fasting for at least 8 hours.  Glucose, capillary     Status: Abnormal   Collection Time: 10/24/20  3:58 PM  Result Value Ref Range   Glucose-Capillary 123 (H) 70 - 99 mg/dL    Comment: Glucose reference range applies only to samples taken after fasting for at least 8 hours.  Glucose, capillary     Status: Abnormal   Collection Time: 10/24/20 10:10 PM  Result Value  Ref Range   Glucose-Capillary 132 (H) 70 - 99 mg/dL    Comment: Glucose reference range applies only to samples taken after fasting for at least 8 hours.  Basic metabolic panel     Status: Abnormal   Collection Time: 10/25/20  1:21 AM  Result Value Ref Range   Sodium 136 135 - 145 mmol/L   Potassium 3.7 3.5 - 5.1 mmol/L   Chloride 88 (L) 98 - 111 mmol/L   CO2 41 (H) 22 - 32 mmol/L   Glucose, Bld 119 (H) 70 - 99 mg/dL    Comment: Glucose reference range applies only to samples taken after fasting for at least 8 hours.   BUN 21 8 - 23 mg/dL   Creatinine, Ser 0.92 0.44 - 1.00 mg/dL   Calcium 8.7 (L) 8.9 - 10.3 mg/dL   GFR, Estimated >60 >60 mL/min    Comment: (NOTE) Calculated using the CKD-EPI Creatinine Equation (2021)    Anion gap 7 5 - 15  Comment: Performed at [facility], [place] [address] [place], [place] [address]  CBC     Status: Abnormal   Collection Time: 10/25/20  1:21 AM  Result Value Ref Range   WBC 10.1 4.0 - 10.5 K/uL   RBC 4.57 3.87 - 5.11 MIL/uL   Hemoglobin 13.6 12.0 - 15.0 g/dL   HCT 44.0 36.0 - 46.0 %   MCV 96.3 80.0 - 100.0 fL   MCH 29.8 26.0 - 34.0 pg   MCHC 30.9 30.0 - 36.0 g/dL   RDW 12.7 11.5 - 15.5 %   Platelets 140 (L) 150 - 400 K/uL   nRBC 0.0 0.0 - 0.2 %    Comment: Performed at [facility], [place] [address] [place], [place] [address]  Glucose, capillary     Status: Abnormal   Collection Time: 10/25/20  7:25 AM  Result Value Ref Range   Glucose-Capillary 112 (H) 70 - 99 mg/dL    Comment: Glucose reference range applies only to samples taken after fasting for at least 8 hours.  Glucose, capillary     Status: Abnormal   Collection Time: 10/25/20 11:59 AM  Result Value Ref Range   Glucose-Capillary 152 (H) 70 - 99 mg/dL    Comment: Glucose reference range applies only to samples taken after fasting for at least 8 hours.  Glucose, capillary     Status: Abnormal   Collection Time: 10/25/20  5:01 PM  Result Value Ref Range    Glucose-Capillary 150 (H) 70 - 99 mg/dL    Comment: Glucose reference range applies only to samples taken after fasting for at least 8 hours.  Glucose, capillary     Status: Abnormal   Collection Time: 10/25/20  7:01 PM  Result Value Ref Range   Glucose-Capillary 120 (H) 70 - 99 mg/dL    Comment: Glucose reference range applies only to samples taken after fasting for at least 8 hours.  Basic metabolic panel     Status: Abnormal   Collection Time: 10/26/20 12:26 AM  Result Value Ref Range   Sodium 134 (L) 135 - 145 mmol/L   Potassium 3.9 3.5 - 5.1 mmol/L   Chloride 90 (L) 98 - 111 mmol/L   CO2 35 (H) 22 - 32 mmol/L   Glucose, Bld 122 (H) 70 - 99 mg/dL    Comment: Glucose reference range applies only to samples taken after fasting for at least 8 hours.   BUN 21 8 - 23 mg/dL   Creatinine, Ser 1.08 (H) 0.44 - 1.00 mg/dL   Calcium 8.9 8.9 - 10.3 mg/dL   GFR, Estimated 50 (L) >60 mL/min    Comment: (NOTE) Calculated using the CKD-EPI Creatinine Equation (2021)    Anion gap 9 5 - 15    Comment: Performed at [facility] [place] [address] [place], [place] [address]  CBC     Status: None   Collection Time: 10/26/20 12:26 AM  Result Value Ref Range   WBC 10.4 4.0 - 10.5 K/uL   RBC 4.48 3.87 - 5.11 MIL/uL   Hemoglobin 13.5 12.0 - 15.0 g/dL   HCT 42.0 36.0 - 46.0 %   MCV 93.8 80.0 - 100.0 fL   MCH 30.1 26.0 - 34.0 pg   MCHC 32.1 30.0 - 36.0 g/dL   RDW 13.0 11.5 - 15.5 %   Platelets 164 150 - 400 K/uL   nRBC 0.0 0.0 - 0.2 %    Comment: Performed at [facility], [address], [place], [place] [address]  Glucose, capillary     Status: Abnormal   Collection Time: 10/26/20  7:56 AM  Result Value Ref Range   Glucose-Capillary 110 (H) 70 - 99 mg/dL    Comment: Glucose reference range applies only to samples taken after fasting for at least 8 hours.   VAS US CAROTID  Result Date: 10/25/2020 Carotid Arterial Duplex Study Indications:       CVA. Risk Factors:      Hypertension,  hyperlipidemia. Limitations        Today's exam was limited due to the patient's inability or                    unwillingness to cooperate and the patient's respiratory                    variation. Comparison Study:  no prior Performing Technologist: Abram Sander RVS  Examination Guidelines: A complete evaluation includes B-mode imaging, spectral Doppler, color Doppler, and power Doppler as needed of all accessible portions of each vessel. Bilateral testing is considered an integral part of a complete examination. Limited examinations for reoccurring indications may be performed as noted.  Right Carotid Findings: +----------+--------+--------+--------+-------------------+--------------------+           PSV cm/sEDV cm/sStenosisPlaque Description Comments             +----------+--------+--------+--------+-------------------+--------------------+ CCA Prox  114     24              heterogenous                            +----------+--------+--------+--------+-------------------+--------------------+ CCA Distal108     23              heterogenous and                                                          calcific                                +----------+--------+--------+--------+-------------------+--------------------+ ICA Prox  104     24      1-39%                      limited                                                                   visualization due to                                                      patient movement     +----------+--------+--------+--------+-------------------+--------------------+ ICA Distal  Not visualized       +----------+--------+--------+--------+-------------------+--------------------+ ECA       110                                                             +----------+--------+--------+--------+-------------------+--------------------+  +----------+--------+-------+--------+-------------------+           PSV cm/sEDV cmsDescribeArm Pressure (mmHG) +----------+--------+-------+--------+-------------------+ [id number]                                        +----------+--------+-------+--------+-------------------+ +---------+--------+---+--------+--+---------+ VertebralPSV cm/s130EDV cm/s35Antegrade +---------+--------+---+--------+--+---------+  Left Carotid Findings: +----------+--------+--------+--------+-------------------+--------------------+           PSV cm/sEDV cm/sStenosisPlaque Description Comments             +----------+--------+--------+--------+-------------------+--------------------+ CCA Prox  109     31              heterogenous                            +----------+--------+--------+--------+-------------------+--------------------+ CCA Distal89      26              heterogenous and                                                          calcific                                +----------+--------+--------+--------+-------------------+--------------------+ ICA Prox  170     56      40-59%  heterogenous and   Velocities may                                         calcific           underestimate degree                                                      of stenosis due to                                                        more proximal                                                             obstruction.         +----------+--------+--------+--------+-------------------+--------------------+ ICA Mid  82      27                                                      +----------+--------+--------+--------+-------------------+--------------------+ ICA Distal78      31                                                      +----------+--------+--------+--------+-------------------+--------------------+ ECA       113                                                              +----------+--------+--------+--------+-------------------+--------------------+ +----------+--------+--------+--------+-------------------+           PSV cm/sEDV cm/sDescribeArm Pressure (mmHG) +----------+--------+--------+--------+-------------------+ [id number]                                         +----------+--------+--------+--------+-------------------+ +---------+--------+--+--------+--+---------+ VertebralPSV cm/s84EDV cm/s20Antegrade +---------+--------+--+--------+--+---------+   Summary: Right Carotid: Velocities in the right ICA are consistent with a 1-39% stenosis. Left Carotid: Velocities in the left ICA are consistent with a 40-59% stenosis. Vertebrals: Bilateral vertebral arteries demonstrate antegrade flow. *See table(s) above for measurements and observations.  Electronically signed by Antony Contras MD on 10/25/2020 at 12:50:43 PM.    Final        Medical Problem List and Plan: 1.  Decrease in endurance, fatigue, unsteady gait, difficulty following commands, intermittent confusion secondary to encephalopathy.  -patient may shower  -ELOS/Goals: 10 to 14 days/supervision.  Admit to CIR 2.  Antithrombotics: -DVT/anticoagulation:  Pharmaceutical: Lovenox  -antiplatelet therapy: ASA 3. Chronic LBP/Pain Management: N/A---used Celebrex prn.  --gets ESI by Dr. Nelva Bush  Monitor with increased exertion 4. Mood: LCSW to follow for evaluation and support.   -antipsychotic agents: N/A 5. Neuropsych: This patient is not fully capable of making decisions on her  own behalf. 6. Skin/Wound Care: Routine pressure relief measures.  7. Fluids/Electrolytes/Nutrition: Strict I/Os. Low salt diet.  CMP ordered for tomorrow a.m. 8. Fluid overload: Off lasix  --had stopped HCTZ due to frequency.   Daily weights 9. Hypercarbic respiratory failure/OSA?/COPD?: H/o pulmonary nodules with cavitation due to PNA in 2007?  --stopped  using inhalers due to cough. Not on MDI at home.    --Continues to have hypoxia--dropped to mid 70's this am on RA-->wean supplemental oxygen as tolerated  --encourage pulmonary hygiene. 10. Hyponatremia: Likely due to diuresis.  CMP ordered for tomorrow AM 11. Morbid obesity: Encourage weight loss  Bary Leriche, PA-C 10/26/2020  I have personally performed a face to face diagnostic evaluation, including, but not limited to relevant history and physical exam findings, of this patient and developed relevant assessment and plan.  Additionally, I have reviewed and concur with the physician assistant's documentation above.  Delice Lesch, MD, ABPMR

## 2020-10-27 DIAGNOSIS — G9340 Encephalopathy, unspecified: Secondary | ICD-10-CM

## 2020-10-27 LAB — CBC WITH DIFFERENTIAL/PLATELET
Abs Immature Granulocytes: 0.02 10*3/uL (ref 0.00–0.07)
Basophils Absolute: 0 10*3/uL (ref 0.0–0.1)
Basophils Relative: 1 %
Eosinophils Absolute: 0.2 10*3/uL (ref 0.0–0.5)
Eosinophils Relative: 2 %
HCT: 40.6 % (ref 36.0–46.0)
Hemoglobin: 12.7 g/dL (ref 12.0–15.0)
Immature Granulocytes: 0 %
Lymphocytes Relative: 21 %
Lymphs Abs: 1.5 10*3/uL (ref 0.7–4.0)
MCH: 30.2 pg (ref 26.0–34.0)
MCHC: 31.3 g/dL (ref 30.0–36.0)
MCV: 96.4 fL (ref 80.0–100.0)
Monocytes Absolute: 0.7 10*3/uL (ref 0.1–1.0)
Monocytes Relative: 10 %
Neutro Abs: 4.6 10*3/uL (ref 1.7–7.7)
Neutrophils Relative %: 66 %
Platelets: 134 10*3/uL — ABNORMAL LOW (ref 150–400)
RBC: 4.21 MIL/uL (ref 3.87–5.11)
RDW: 13 % (ref 11.5–15.5)
WBC: 7 10*3/uL (ref 4.0–10.5)
nRBC: 0 % (ref 0.0–0.2)

## 2020-10-27 LAB — COMPREHENSIVE METABOLIC PANEL
ALT: 27 U/L (ref 0–44)
AST: 35 U/L (ref 15–41)
Albumin: 3 g/dL — ABNORMAL LOW (ref 3.5–5.0)
Alkaline Phosphatase: 50 U/L (ref 38–126)
Anion gap: 5 (ref 5–15)
BUN: 24 mg/dL — ABNORMAL HIGH (ref 8–23)
CO2: 39 mmol/L — ABNORMAL HIGH (ref 22–32)
Calcium: 8.7 mg/dL — ABNORMAL LOW (ref 8.9–10.3)
Chloride: 93 mmol/L — ABNORMAL LOW (ref 98–111)
Creatinine, Ser: 1.07 mg/dL — ABNORMAL HIGH (ref 0.44–1.00)
GFR, Estimated: 51 mL/min — ABNORMAL LOW (ref >60)
Glucose, Bld: 112 mg/dL — ABNORMAL HIGH (ref 70–99)
Potassium: 4 mmol/L (ref 3.5–5.1)
Sodium: 137 mmol/L (ref 135–145)
Total Bilirubin: 0.6 mg/dL (ref 0.3–1.2)
Total Protein: 6.1 g/dL — ABNORMAL LOW (ref 6.5–8.1)

## 2020-10-27 NOTE — Progress Notes (Signed)
Inpatient Rehabilitation Care Coordinator Assessment and Plan Patient Details  Name: [name] MRN: [id number] Date of Birth: 01-Jun-1933  Today's Date: 10/27/2020  Hospital Problems: Principal Problem:   Encephalopathy Active Problems:   Stroke (cerebrum) [facility])  Past Medical History:  Past Medical History:  Diagnosis Date  . Colon polyp   . COPD (chronic obstructive pulmonary disease) (HCC)    MILD  . Hyperlipidemia   . Hypertension   . OA (osteoarthritis)    OF THE KNEES  . Osteopenia   . Shingles   . Thyroid cyst    Past Surgical History:  Past Surgical History:  Procedure Laterality Date  . BREAST EXCISIONAL BIOPSY Right    benign   Social History:  reports that she has quit smoking. She has never used smokeless tobacco. She reports that she does not drink alcohol and does not use drugs.  Family / Support Systems Marital Status: Widow/Widower How Long?: Since 1999 Patient Roles: Other (Comment) (sibling and retiree) Other Supports: Billie-sister in [place] [phone]  Hal Neer- friend [phone] Anticipated Caregiver: Her friend who is a retired Therapist, sports coming for two weeks to stay with her. Has many friends who act like family who she can count on to assist her Ability/Limitations of Caregiver: supervision-mod/i level short time 2 weeks 24 hr Caregiver Availability: 24/7 (Will have friend stay with her 2 weeks then have intermittent assist) Family Dynamics: Close knit with sister in [place] and her friends here. She has been here since 1989 and worked at [employer] for 27 years. She feels blessed to have the friends she does and knows she can count on them if needed.  Social History Preferred language: English Religion: Baptist Cultural Background: No issues Education: Secretary/administrator educated Read: Yes Write: Yes Employment Status: Retired Public relations account executive Issues: No issues Guardian/Conservator: None-according to MD pt is not fully capable  of makng her own decision while here. Will look toward her sister in [place] to make any decisions while here or until pt is able too   Abuse/Neglect Abuse/Neglect Assessment Can Be Completed: Yes Physical Abuse: Denies Verbal Abuse: Denies Sexual Abuse: Denies Exploitation of patient/patient's resources: Denies Self-Neglect: Denies  Emotional Status Pt's affect, behavior and adjustment status: Pt is much better she reports she heard she was out of it and is glad to be back closer to her baseline. She has always been independent and wants to get back to this level She is very self sufficent and has managed to adapt to her health issues over the years to remain indepedent. Recent Psychosocial Issues: other health issues Psychiatric History: NO history deferred depression screen due to coping appropriately and able to verbalize her concerns. Will ask team if would benefit from seeing neruo-psych while here Substance Abuse History: No issues  Patient / Family Perceptions, Expectations & Goals Pt/Family understanding of illness & functional limitations: Pt and sister are able to explain her condition and treatment plan going forward. She talks with the MD daily and feels she understands her issues and what MD is doing to resolve them. Premorbid pt/family roles/activities: Sister, friend, retiree, church member, etc Anticipated changes in roles/activities/participation: resume Pt/family expectations/goals: Pt states: " I want to regain my independence and hopefully get rid of this oxygen I did not use this at home before this."  Sister states: " I hope she does well, she is strong willied and can do it if anyone can."  [employer]: None Premorbid Home Care/DME Agencies: Other (Comment) (has rw,  rollator , tub seat and grab bars) Transportation available at discharge: self, may need to rely upon friends for a short time  Discharge Planning Living Arrangements:  Alone Support Systems: Other relatives,Friends/neighbors,Church/faith community Type of Residence: Private residence Insurance Resources: [employer] (specify) ([employer] supplement) Financial Resources: Radio broadcast assistant Screen Referred: No Living Expenses: Own Money Management: Patient Does the patient have any problems obtaining your medications?: No Home Management: Self may need assist now untill gets her strength back Patient/Family Preliminary Plans: Return home with her friend coming who is a retired Therapist, sports for two weeks, if more is needed she will need to go from here. She is hopeful by them with friends coming in checking on her she will be able to do for herself. Aware team evaluating her today and setting goals. Care Coordinator Barriers to Discharge: Decreased caregiver support Care Coordinator Barriers to Discharge Comments: 2 weeks of coverage at discharge Care Coordinator Anticipated Follow Up Needs: HH/OP  Clinical Impression Pleasant female who is motivated to do well and recover form this health scare. She is hopeful by getting stronger she can wean off her O2 and regain her independence. She has very strong friend support and a retired Therapist, sports who will be coming to stay with for two weeks at discharge. Will await team evaluations and work on safe discharge plan for her.  Elease Hashimoto 10/27/2020, 9:55 AM

## 2020-10-27 NOTE — Evaluation (Signed)
Speech Language Pathology Assessment and Plan  Patient Details  Name: [name] MRN: [id number] Date of Birth: [birth year]  SLP Diagnosis: Cognitive Impairments  Rehab Potential: Good ELOS: 5-7 days    Today's Date: 10/27/2020 SLP Individual Time: 0800-0900 SLP Individual Time Calculation (min): 60 min   Hospital Problem: Principal Problem:   Encephalopathy Active Problems:   Stroke (cerebrum) [facility])  Past Medical History:  Past Medical History:  Diagnosis Date  . Colon polyp   . COPD (chronic obstructive pulmonary disease) (HCC)    MILD  . Hyperlipidemia   . Hypertension   . OA (osteoarthritis)    OF THE KNEES  . Osteopenia   . Shingles   . Thyroid cyst    Past Surgical History:  Past Surgical History:  Procedure Laterality Date  . BREAST EXCISIONAL BIOPSY Right    benign    Assessment / Plan / Recommendation Patient is an 87 year old female with history of HTN, COPD?, OA bilateral knees, chronic back pain who was admitted on 10/22/2018 increasing shortness of breath and lower extremity edema that started after her PCP took her off diuretics (due to complaints of frequency). History taken from chart review and patient. She was hypoxic at admission and was started on IV diuresis as well as supplemental oxygen. She developed lethargy with head tremors and confusion while being evaluated in ED and stat CT head was unremarkable for acute intracranial process. MRI/MRA brain revealed acute small left frontal white matter infarct with widespread intracranial atherosclerosis and moderate stenosis of L-VV junction and supraclinoid right-ICA. Echocardiogram with ejection fraction of 65 to 70%, mild concentric LVH and evidence of PAH. She was transferred to [facility] in for work-up and intubated and sedated prior to transport. EEG done due to concerns of seizures start and showed moderate diffuse encephalopathy and bilateral hand tremors noted during the study without  concomitant EEG changes. She tolerated extubation the next day and respiratory status stable on supplemental oxygen. Bilateral lower extremities negative for DVT. Carotid dopplers showed left 40-59% stenosis. Dr. Leonie Man felt that stroke due to small vessel disease, low-dose ASA added and stroke likely clinically silent. She has had issues with confusion and ABG done showing evidence of hypercarbia-CO2 74.8, pH 7.48 and bicarb 49. BIPAP ordered for support but patient been refusing this. She continues to require supplemental oxygen due to hypoxia as well as intermittent tachypnea with RR up to 30's. Therapy ongoing and patient limited by endurance, fatigue, unsteady gait, difficulty following commands as well as intermittent confusion. CIR recommended due to functional decline.  Patient transferred to CIR on 10/26/2020 .   Clinical Impression Patient presents with a mild cognitive impairment with main deficit in higher level attention. She demonstrated adequate delayed recall, correctly recalling 3/4 words after 10 minute delay without cues. She demonstrated good problem solving skills, good reasoning and safety awareness. Patient was very verbose and tangential during conversation (not sure of her baseline) and did require verbal cues to redirect attention. When completing a mildly complex pattern construction task, she verbalized becoming "flustered" as she was not able to complete in reasonable amount of time. Patient likely to be a short term CIR placement and SLP is recommending focus on maintaining attention to perform higher level problem solving tasks while maintaining adequate attention and awareness to errors.    Skilled Therapeutic Interventions          Speech-language cognitive evaluation  SLP Assessment  Patient will need skilled [facility]  during CIR admission    Recommendations  Patient destination: Home Follow up Recommendations: None Equipment Recommended: None  recommended by SLP    SLP Frequency 3 to 5 out of 7 days   SLP Duration  SLP Intensity  SLP Treatment/Interventions 5-7 days  Minumum of 1-2 x/day, 30 to 90 minutes  Cognitive remediation/compensation;Medication managment;Patient/family education    Pain Pain Assessment Pain Scale: 0-10 Pain Score: 0-No pain  Prior Functioning Cognitive/Linguistic Baseline: Within functional limits Type of Home: House  Lives With: Alone Available Help at Discharge: Family;Available 24 hours/day Education: 12th grade Vocation: Retired  Programmer, systems Overall Cognitive Status: Impaired/Different from baseline Arousal/Alertness: Awake/alert Orientation Level: Oriented X4 Attention: Selective Selective Attention: Impaired Selective Attention Impairment: Verbal complex;Functional complex Memory: Impaired Memory Impairment: Retrieval deficit;Other (comment) (recalled 3/4 words after 10 minute delay without cues) Immediate Memory Recall: Sock;Blue;Bed Memory Recall Sock: With Cue Memory Recall Blue: Not able to recall Memory Recall Bed: Not able to recall Awareness: Appears intact Problem Solving: Impaired Problem Solving Impairment: Functional complex;Verbal complex Safety/Judgment: Appears intact  Comprehension Auditory Comprehension Overall Auditory Comprehension: Appears within functional limits for tasks assessed Expression Expression Primary Mode of Expression: Verbal Verbal Expression Overall Verbal Expression: Appears within functional limits for tasks assessed Written Expression Dominant Hand: Right Oral Motor Oral Motor/Sensory Function Overall Oral Motor/Sensory Function: Within functional limits Motor Speech Overall Motor Speech: Appears within functional limits for tasks assessed  Care Tool Care Tool Cognition Expression of Ideas and Wants Expression of Ideas and Wants: Without difficulty (complex and basic) - expresses complex messages without difficulty  and with speech that is clear and easy to understand   Understanding Verbal and Non-Verbal Content Understanding Verbal and Non-Verbal Content: Understands (complex and basic) - clear comprehension without cues or repetitions   Memory/Recall Ability *first 3 days only Memory/Recall Ability *first 3 days only: That he or she is in a hospital/hospital unit;Current season;Staff names and faces     Short Term Goals: Week 1: SLP Short Term Goal 1 (Week 1): Patient will perform complex level problem solving tasks in areas of financial and medication management with supervision to mod I SLP Short Term Goal 2 (Week 1): Patient will demonstrate awareness to and self correct/attempt to self correct during problem solving tasks with mod I. SLP Short Term Goal 3 (Week 1): Patient will demonatrate adequate anticipatory awareness during discussion on anticipated needs upon discharge.  Refer to Care Plan for Long Term Goals  Recommendations for other services: None   Discharge Criteria: Patient will be discharged from SLP if patient refuses treatment 3 consecutive times without medical reason, if treatment goals not met, if there is a change in medical status, if patient makes no progress towards goals or if patient is discharged from hospital.  The above assessment, treatment plan, treatment alternatives and goals were discussed and mutually agreed upon: by patient  Sonia Baller, MA, CCC-SLP Speech Therapy

## 2020-10-27 NOTE — Evaluation (Signed)
Occupational Therapy Assessment and Plan  Patient Details  Name: [name] MRN: [id number] Date of Birth: 03/01/1933  OT Diagnosis: cognitive deficits and muscle weakness (generalized) Rehab Potential: Rehab Potential (ACUTE ONLY): Excellent ELOS: 5-7 days   Today's Date: 10/27/2020 OT Individual Time: 9485-4627 OT Individual Time Calculation (min): 65 min     Hospital Problem: Principal Problem:   Encephalopathy Active Problems:   Stroke (cerebrum) ([place])   Past Medical History:  Past Medical History:  Diagnosis Date  . Colon polyp   . COPD (chronic obstructive pulmonary disease) ([facility])    MILD  . Hyperlipidemia   . Hypertension   . OA (osteoarthritis)    OF THE KNEES  . Osteopenia   . Shingles   . Thyroid cyst    Past Surgical History:  Past Surgical History:  Procedure Laterality Date  . BREAST EXCISIONAL BIOPSY Right    benign    Assessment & Plan Clinical Impression:  [name] is an 87 year old female with history of HTN, COPD?,  OA bilateral knees, chronic back pain who was admitted on 10/22/2018 increasing shortness of breath and lower extremity edema that started after her PCP took her off diuretics  (due to complaints of frequency).  History taken from chart review and patient. She was hypoxic at admission and was started on IV diuresis as well as supplemental oxygen.  She developed lethargy with head tremors and confusion while being evaluated in ED and stat CT head was unremarkable for acute intracranial process. MRI/MRA brain revealed acute small left frontal white matter infarct with widespread intracranial atherosclerosis and moderate stenosis of L-VV junction and supraclinoid right-ICA.  Echocardiogram with ejection fraction of 65 to 70%, mild concentric LVH and evidence of PAH.  She was transferred to [facility] in for work-up and intubated and sedated prior to transport.  EEG done due to concerns of seizures start and showed moderate diffuse  encephalopathy and bilateral hand tremors noted during the study without concomitant EEG changes. She tolerated extubation the next day and respiratory status stable on supplemental oxygen.  Bilateral lower extremities negative for DVT.  Carotid dopplers showed left 40-59% stenosis.  Dr. Leonie Man felt that stroke due to small vessel disease, low-dose ASA added and stroke likely clinically silent.  She has had issues with confusion and ABG done showing evidence of hypercarbia-CO2 74.8, pH 7.48 and bicarb 49. BIPAP ordered for support but patient been refusing this.  She continues to require supplemental oxygen due to hypoxia as well as intermittent tachypnea with RR up to 30's. Therapy ongoing and patient limited by endurance, fatigue, unsteady gait, difficulty following commands as well as intermittent confusion. CIR recommended due to functional decline.  Please see preadmission assessment from earlier today as well.   Patient transferred to CIR on 10/26/2020 .    Patient currently requires supervision with basic self-care skills secondary to muscle weakness, decreased cardiorespiratoy endurance, decreased problem solving and decreased memory and decreased standing balance and decreased balance strategies.  Prior to hospitalization, patient was fully independent and lived alone.  Patient will benefit from skilled intervention to increase independence with basic self-care skills and increase level of independence with iADL prior to discharge home independently.  Anticipate patient will require intermittent supervision and follow up home health.  OT - End of Session Activity Tolerance: Tolerates 30+ min activity without fatigue Endurance Deficit: Yes Endurance Deficit Description: needs O2 for support OT Assessment Rehab Potential (ACUTE ONLY): Excellent OT Patient demonstrates impairments in  the following area(s): Balance;Endurance;Motor;Cognition OT Basic ADL's Functional Problem(s):  Bathing;Dressing;Toileting OT Advanced ADL's Functional Problem(s): Simple Meal Preparation;Light Housekeeping OT Transfers Functional Problem(s): Toilet;Tub/Shower OT Additional Impairment(s): None OT Plan OT Intensity: Minimum of 1-2 x/day, 45 to 90 minutes OT Frequency: 5 out of 7 days OT Duration/Estimated Length of Stay: 5-7 days OT Treatment/Interventions: Balance/vestibular training;Cognitive remediation/compensation;Discharge planning;Functional mobility training;DME/adaptive equipment instruction;Patient/family education;Psychosocial support;Therapeutic Activities;Self Care/advanced ADL retraining;Therapeutic Exercise;UE/LE Strength taining/ROM OT Self Feeding Anticipated Outcome(s): independent OT Basic Self-Care Anticipated Outcome(s): Mod I OT Toileting Anticipated Outcome(s): Mod I OT Bathroom Transfers Anticipated Outcome(s): Mod I OT Recommendation Patient destination: Home Follow Up Recommendations: Home health OT Equipment Recommended: None recommended by OT Equipment Details: pt has DME for home bathroom   OT Evaluation Precautions/Restrictions  Precautions Precautions: Fall Precaution Comments: has used O2 since admission only Restrictions Weight Bearing Restrictions: No  Pain Pain Assessment Pain Scale: 0-10 Pain Score: 0-No pain Home Living/Prior Functioning Home Living Family/patient expects to be discharged to:: Private residence Living Arrangements: Alone Available Help at Discharge: Family,Available 24 hours/day Type of Home: House Home Access: Level entry,Stairs to enter Entrance Stairs-Number of Steps: 2 steps from garage; 1 step in front Home Layout: One level Bathroom Shower/Tub: Walk-in shower (step in tub with seat) Bathroom Toilet: Handicapped height Bathroom Accessibility: Yes  Lives With: Alone IADL History Education: 12th grade Prior Function Level of Independence: Independent with basic ADLs,Independent with homemaking with  ambulation,Independent with gait,Independent with transfers  Able to Take Stairs?: Yes Driving: Yes Vocation: Retired Comments: drives; Air traffic controller Baseline Vision/History: Wears glasses Wears Glasses: Reading only Patient Visual Report: No change from baseline Vision Assessment?: No apparent visual deficits Perception  Perception: Within Functional Limits Praxis Praxis: Intact Cognition Overall Cognitive Status: Impaired/Different from baseline Arousal/Alertness: Awake/alert Orientation Level: Person;Place;Situation Person: Oriented Place: Oriented Situation: Oriented Year: 2022 Month: March Day of Week: Correct Memory: Impaired Memory Impairment: Retrieval deficit;Other (comment) (recalled 3/4 words after 10 minute delay without cues) Immediate Memory Recall: Sock;Blue;Bed Memory Recall Sock: With Cue Memory Recall Blue: Not able to recall Memory Recall Bed: Not able to recall Attention: Selective Selective Attention: Impaired Selective Attention Impairment: Verbal complex;Functional complex Awareness: Appears intact Problem Solving: Impaired Problem Solving Impairment: Functional complex;Verbal complex Safety/Judgment: Appears intact Sensation Sensation Light Touch: Appears Intact Hot/Cold: Appears Intact Proprioception: Appears Intact Stereognosis: Appears Intact Coordination Gross Motor Movements are Fluid and Coordinated: Yes Fine Motor Movements are Fluid and Coordinated: Yes Motor  Motor Motor: Within Functional Limits Motor - Skilled Clinical Observations: generalized motor weakness  Trunk/Postural Assessment  Postural Control Postural Control: Within Functional Limits  Balance Dynamic Sitting Balance Dynamic Sitting - Level of Assistance: 5: Stand by assistance Static Standing Balance Static Standing - Level of Assistance: 5: Stand by assistance Dynamic Standing Balance Dynamic Standing - Level of Assistance: 4: Min assist (with use of  RW for support) Extremity/Trunk Assessment RUE Assessment General Strength Comments: 4/5 LUE Assessment General Strength Comments: 4/5  Care Tool Care Tool Self Care Eating   Eating Assist Level: Set up assist    Oral Care    Oral Care Assist Level: Set up assist    Bathing   Body parts bathed by patient: Right arm;Left arm;Chest;Abdomen;Front perineal area;Buttocks;Right upper leg;Left upper leg;Right lower leg;Left lower leg;Face     Assist Level: Supervision/Verbal cueing    Upper Body Dressing(including orthotics)   What is the patient wearing?: Pull over shirt   Assist Level: Supervision/Verbal cueing    Lower Body Dressing (excluding footwear)  What is the patient wearing?: Underwear/pull up;Pants Assist for lower body dressing: Minimal Assistance - Patient > 75%    Putting on/Taking off footwear   What is the patient wearing?: Non-skid slipper socks Assist for footwear: Moderate Assistance - Patient 50 - 74%       Care Tool Toileting Toileting activity   Assist for toileting: Minimal Assistance - Patient > 75%     Care Tool Bed Mobility Roll left and right activity        Sit to lying activity        Lying to sitting edge of bed activity         Care Tool Transfers Sit to stand transfer   Sit to stand assist level: Supervision/Verbal cueing    Chair/bed transfer         Toilet transfer   Assist Level: Supervision/Verbal cueing     Care Tool Cognition Expression of Ideas and Wants Expression of Ideas and Wants: Without difficulty (complex and basic) - expresses complex messages without difficulty and with speech that is clear and easy to understand   Understanding Verbal and Non-Verbal Content Understanding Verbal and Non-Verbal Content: Understands (complex and basic) - clear comprehension without cues or repetitions   Memory/Recall Ability *first 3 days only Memory/Recall Ability *first 3 days only: That he or she is in a hospital/hospital  unit;Current season;Staff names and faces    Refer to Care Plan for [place] 1 OT Short Term Goal 1 (Week 1): STGS = LTGs  Recommendations for other services: None    Skilled Therapeutic Intervention ADL ADL Eating: Set up Grooming: Setup Upper Body Bathing: Supervision/safety Where Assessed-Upper Body Bathing: Shower Lower Body Bathing: Supervision/safety Where Assessed-Lower Body Bathing: Shower Upper Body Dressing: Supervision/safety Where Assessed-Upper Body Dressing: Chair Lower Body Dressing: Minimal assistance Where Assessed-Lower Body Dressing: Chair Toileting: Minimal assistance Where Assessed-Toileting: Glass blower/designer: Close supervision Toilet Transfer Method: Counselling psychologist: Energy manager: Close supervision Social research officer, government Method: Heritage manager: Grab bars Mobility   close S with RW for ambulation in the room  Pt seen for initial evaluation and ADL training. She had a lot of questions in regards to how many hours of therapy she will have,who will be working with her. Pt stated "honestly I know I am not remembering things well, but physically I feel pretty good. I just need to work on my walking and balance". Pt very excited to shower. She is on 2L of O2 and stated she did not feel like she really needed it. Suggested she keep it on for now as she will be engaging in a lot of activity.  Pt used Rw and stood from low recliner with S, ambulated into bathroom and stood for a few minutes as therapist adjusted bench (after noticing a leg was unbalanced), stepped into shower, out of shower to toilet all with S.    In shower she bathed herself even reaching towards feet and expressed difficulty reaching perineal area. She said this started a year ago when she gained 20 lbs during covid. She stands and uses the washcloth as a sling using B hands to move it back and forth  to reach.   On toilet she had some difficulty reaching her feet to don underwear and pants over feet.  She said this has started with the wt gain too.   Pt will need to be introduced to AE to help  in these areas.  Pt kept saying, 'I feel great, so energized after the shower!"  Pt resting in recliner with chair pad alarm on and all needs met.    Discharge Criteria: Patient will be discharged from OT if patient refuses treatment 3 consecutive times without medical reason, if treatment goals not met, if there is a change in medical status, if patient makes no progress towards goals or if patient is discharged from hospital.  The above assessment, treatment plan, treatment alternatives and goals were discussed and mutually agreed upon: by patient  [place] 10/27/2020, 1:12 PM

## 2020-10-27 NOTE — Progress Notes (Signed)
Patient transferred from 4MW arrived to 5C06 at 1435, alert and oriented verbalizing needs and wants appropriately.  O2 @3L  via [place] in place as ordered.

## 2020-10-27 NOTE — Progress Notes (Signed)
Inpatient Rehabilitation  Patient information reviewed and entered into eRehab system by Melissa M. Bowie, M.A., CCC/SLP, PPS Coordinator.  Information including medical coding, functional ability and quality indicators will be reviewed and updated through discharge.    

## 2020-10-27 NOTE — Evaluation (Signed)
Physical Therapy Assessment and Plan  Patient Details  Name: [name] MRN: [id number] Date of Birth: 1933/03/30  PT Diagnosis: Abnormality of gait, Difficulty walking, Edema and Impaired cognition Rehab Potential: Good ELOS: 5-10 days   Today's Date: 10/27/2020 PT Individual Time: 1300-1400 PT Individual Time Calculation (min): 60 min    Hospital Problem: Principal Problem:   Encephalopathy Active Problems:   Stroke (cerebrum) ([place])   Past Medical History:  Past Medical History:  Diagnosis Date  . Colon polyp   . COPD (chronic obstructive pulmonary disease) (HCC)    MILD  . Hyperlipidemia   . Hypertension   . OA (osteoarthritis)    OF THE KNEES  . Osteopenia   . Shingles   . Thyroid cyst    Past Surgical History:  Past Surgical History:  Procedure Laterality Date  . BREAST EXCISIONAL BIOPSY Right    benign    Assessment & Plan Clinical Impression: Patient is an 87 year old female with history of HTN, COPD?,  OA bilateral knees, chronic back pain who was admitted on 10/22/2018 increasing shortness of breath and lower extremity edema that started after her PCP took her off diuretics  (due to complaints of frequency).  History taken from chart review and patient. She was hypoxic at admission and was started on IV diuresis as well as supplemental oxygen.  She developed lethargy with head tremors and confusion while being evaluated in ED and stat CT head was unremarkable for acute intracranial process. MRI/MRA brain revealed acute small left frontal white matter infarct with widespread intracranial atherosclerosis and moderate stenosis of L-VV junction and supraclinoid right-ICA.  Echocardiogram with ejection fraction of 65 to 70%, mild concentric LVH and evidence of PAH.  She was transferred to [facility] in for work-up and intubated and sedated prior to transport.  EEG done due to concerns of seizures start and showed moderate diffuse encephalopathy and bilateral  hand tremors noted during the study without concomitant EEG changes. She tolerated extubation the next day and respiratory status stable on supplemental oxygen.  Bilateral lower extremities negative for DVT.  Carotid dopplers showed left 40-59% stenosis.  Dr. Leonie Man felt that stroke due to small vessel disease, low-dose ASA added and stroke likely clinically silent.  She has had issues with confusion and ABG done showing evidence of hypercarbia-CO2 74.8, pH 7.48 and bicarb 49. BIPAP ordered for support but patient been refusing this.  She continues to require supplemental oxygen due to hypoxia as well as intermittent tachypnea with RR up to 30's. Therapy ongoing and patient limited by endurance, fatigue, unsteady gait, difficulty following commands as well as intermittent confusion. CIR recommended due to functional decline.  Patient transferred to CIR on 10/26/2020 .   Patient currently requires CGA with mobility secondary to muscle weakness, decreased cardiorespiratoy endurance and decreased oxygen support, decreased problem solving and decreased memory and decreased standing balance and decreased balance strategies.  Prior to hospitalization, patient was modified independent  with mobility and lived with Alone in a House home.  Home access is 2 steps from garage; 1 step in frontLevel entry,Stairs to enter.  Patient will benefit from skilled PT intervention to maximize safe functional mobility, minimize fall risk and decrease caregiver burden for planned discharge home with intermittent assist.  Anticipate patient will benefit from follow up [facility] at discharge.  PT - End of Session Activity Tolerance: Tolerates 10 - 20 min activity with multiple rests Endurance Deficit: Yes Endurance Deficit Description: needs O2 for support PT  Assessment Rehab Potential (ACUTE/IP ONLY): Good PT Barriers to Discharge: Decreased caregiver support;Insurance for SNF coverage;Lack of/limited family support;New oxygen PT  Patient demonstrates impairments in the following area(s): Balance;Edema;Endurance;Motor;Safety PT Transfers Functional Problem(s): Bed Mobility;Bed to Chair;Car PT Locomotion Functional Problem(s): Ambulation;Stairs PT Plan PT Intensity: Minimum of 1-2 x/day ,45 to 90 minutes PT Frequency: 5 out of 7 days PT Duration Estimated Length of Stay: 5-10 days PT Treatment/Interventions: Ambulation/gait training;Balance/vestibular training;Disease management/prevention;Discharge planning;Functional mobility training;Neuromuscular re-education;Skin care/wound management;Psychosocial support;Therapeutic Activities;Therapeutic Exercise;Wheelchair propulsion/positioning;Visual/perceptual remediation/compensation;UE/LE Strength taining/ROM;Splinting/orthotics;Cognitive remediation/compensation;DME/adaptive equipment instruction;Pain management;Patient/family education;Stair training;UE/LE Coordination activities;Community reintegration PT Transfers Anticipated Outcome(s): mod I PT Locomotion Anticipated Outcome(s): mod I PT Recommendation Recommendations for Other Services: Neuropsych consult Follow Up Recommendations: Home health PT Patient destination: Home Equipment Recommended: To be determined Equipment Details: Pt owns rollator   PT Evaluation Precautions/Restrictions Precautions Precautions: Fall Precaution Comments: Monitor HR and O2 - only on oxygen since admission Restrictions Weight Bearing Restrictions: No General Chart Reviewed: Yes Additional Pertinent History: history of HTN, COPD?,  OA bilateral knees, chronic back pain Family/Caregiver Present: No  Home Living/Prior Functioning Home Living Available Help at Discharge: Family;Available 24 hours/day Type of Home: House Home Access: Level entry;Stairs to enter Entrance Stairs-Number of Steps: 2 steps from garage; 1 step in front Entrance Stairs-Rails: None Home Layout: One level Bathroom Shower/Tub: Walk-in shower (step in tub  with seat) Bathroom Toilet: Handicapped height Bathroom Accessibility: Yes  Lives With: Alone Prior Function Level of Independence: Independent with basic ADLs;Independent with homemaking with ambulation;Independent with gait;Independent with transfers  Able to Take Stairs?: Yes Driving: Yes Vocation: Retired Comments: drives; 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 Praxis Praxis: Intact  Cognition Overall Cognitive Status: Impaired/Different from baseline Arousal/Alertness: Awake/alert Orientation Level: Oriented X4 Attention: Selective Selective Attention: Impaired Selective Attention Impairment: Verbal complex;Functional complex Memory: Impaired Memory Impairment: Retrieval deficit;Other (comment) (recalled 3/4 words after 10 minute delay without cues) Immediate Memory Recall: Sock;Blue;Bed Memory Recall Sock: With Cue Memory Recall Blue: Not able to recall Memory Recall Bed: Not able to recall Awareness: Appears intact Problem Solving: Impaired Problem Solving Impairment: Functional complex;Verbal complex Safety/Judgment: Appears intact Sensation Sensation Light Touch: Appears Intact Hot/Cold: Appears Intact Proprioception: Appears Intact Stereognosis: Appears Intact Coordination Gross Motor Movements are Fluid and Coordinated: Yes Fine Motor Movements are Fluid and Coordinated: Yes Finger Nose Finger Test: [facility] Heel Shin Test: [facility] Motor  Motor Motor: Within Functional Limits Motor - Skilled Clinical Observations: generalized motor weakness. No focal neuro deficits  Trunk/Postural Assessment  Cervical Assessment Cervical Assessment: Within Functional Limits Thoracic Assessment Thoracic Assessment: Exceptions to [facility] (Rounded shoulders) Lumbar Assessment Lumbar Assessment: Exceptions to [facility] (posterior pelvic tilt) Postural Control Postural Control: Within Functional Limits  Balance Balance Balance Assessed:  Yes Dynamic Sitting Balance Dynamic Sitting - Level of Assistance: 5: Stand by assistance Static Standing Balance Static Standing - Level of Assistance: 5: Stand by assistance Dynamic Standing Balance Dynamic Standing - Level of Assistance: 4: Min assist (RW for support) Extremity Assessment  RUE Assessment General Strength Comments: 4/5 LUE Assessment General Strength Comments: 4/5 RLE Assessment General Strength Comments: grossly 4+/5 LLE Assessment General Strength Comments: grossly 4+/5  Care Tool Care Tool Bed Mobility Roll left and right activity   Roll left and right assist level: Minimal Assistance - Patient > 75%    Sit to lying activity   Sit to lying assist level: Minimal Assistance - Patient > 75%    Lying to sitting edge of bed activity   Lying to  sitting edge of bed assist level: Minimal Assistance - Patient > 75%     Care Tool Transfers Sit to stand transfer   Sit to stand assist level: Contact Guard/Touching assist    Chair/bed transfer   Chair/bed transfer assist level: Contact Guard/Touching assist     Toilet transfer   Assist Level: Supervision/Verbal cueing    Car transfer   Car transfer assist level: Contact Guard/Touching assist      Care Tool Locomotion Ambulation   Assist level: Contact Guard/Touching assist Assistive device: Walker-rolling Max distance: 100 ft  Walk 10 feet activity   Assist level: Contact Guard/Touching assist Assistive device: Walker-rolling   Walk 50 feet with 2 turns activity   Assist level: Contact Guard/Touching assist Assistive device: Walker-rolling  Walk 150 feet activity Walk 150 feet activity did not occur: Safety/medical concerns (Standing rest break)      Walk 10 feet on uneven surfaces activity Walk 10 feet on uneven surfaces activity did not occur: Safety/medical concerns      Stairs   Assist level: Contact Guard/Touching assist Stairs assistive device: 2 hand rails Max number of stairs: 4  Walk  up/down 1 step activity   Walk up/down 1 step (curb) assist level: Contact Guard/Touching assist Walk up/down 1 step or curb assistive device: 2 hand rails    Walk up/down 4 steps activity Walk up/down 4 steps assist level: Contact Guard/Touching assist Walk up/down 4 steps assistive device: 2 hand rails  Walk up/down 12 steps activity Walk up/down 12 steps activity did not occur: Safety/medical concerns (Fatigue)      Pick up small objects from floor Pick up small object from the floor (from standing position) activity did not occur: N/A      Wheelchair Will patient use wheelchair at discharge?: No          Wheel 50 feet with 2 turns activity      Wheel 150 feet activity        Refer to Care Plan for Long Term Goals  SHORT TERM GOAL WEEK 1 PT Short Term Goal 1 (Week 1): STG = LTG due to ELOS  Recommendations for other services: Neuropsych  Skilled Therapeutic Intervention Mobility Transfers Transfers: Sit to Stand;Stand to [employer] Transfers Sit to Stand: Contact Guard/Touching assist Stand to Sit: Contact Guard/Touching assist Stand Pivot Transfers: Contact Guard/Touching assist Stand Pivot Transfer Details: Verbal cues for gait pattern;Visual cues/gestures for sequencing;Verbal cues for sequencing;Verbal cues for safe use of DME/AE;Verbal cues for precautions/safety;Verbal cues for technique;Tactile cues for sequencing Transfer (Assistive device): Rolling walker Locomotion  Gait Ambulation: Yes Gait Assistance: Contact Guard/Touching assist Gait Distance (Feet): 100 Feet Assistive device: Rolling walker Gait Gait: Yes Gait Pattern: Impaired Gait Pattern: Step-through pattern;Decreased step length - right;Decreased step length - left;Trunk flexed;Wide base of support Gait velocity: slowed Stairs / Additional Locomotion Stairs: Yes Stairs Assistance: Contact Guard/Touching assist Stair Management Technique: Two rails Number of Stairs: 4 Height of Stairs:  6 Wheelchair Mobility Wheelchair Mobility: No  Skilled Intervention: Pt received sitting in recliner - agreeable to therapy. Connected to wall oxygen on 1.5L via nasal canula. Resting O2 97% with HR 99. Pt pleasant, oriented x4. She's quite verbose and often tangential in speech but can be easily redirected. Strength symmetrical in all 4 extremities, grossly 4/5. Initiated functional mobility as outlined above. CGA for all mobility including sit<>stands, car transfer, gait, and stairs. She's able to ambulate ~127f with CGA and RW, on 2L portable oxygen. Oxygen desaturated as low as  91% after gait, needing guided PLB to return to 95%. 5xSTS 16.48 seconds - 5xSTS > 15 seconds indicates increased falls risk. She needed a few standing rest breaks during gait due to fatigue but no formal LOB or knee buckling noted. She ended session seated in recliner with chair alarm on, reconnected to wall O2 of 1.5%. Pt instructed on call bell for staff assist, voiced understanding.  Instructed pt in results of PT evaluation as detailed above, PT POC, rehab potential, rehab goals, and discharge recommendations. Additionally discussed CIR's policies regarding fall safety and use of chair alarm and/or quick release belt. Pt verbalized understanding and in agreement. Will update pt's family members as they become available.   Discharge Criteria: Patient will be discharged from PT if patient refuses treatment 3 consecutive times without medical reason, if treatment goals not met, if there is a change in medical status, if patient makes no progress towards goals or if patient is discharged from hospital.  The above assessment, treatment plan, treatment alternatives and goals were discussed and mutually agreed upon: by patient  Alger Simons PT, DPT 10/27/2020, 4:24 PM

## 2020-10-27 NOTE — Progress Notes (Addendum)
PROGRESS NOTE   Subjective/Complaints: Says she has been unable to use her phone on this floor or the last floor- demonstrates to me and she is right that it is not working. Asked nursing if they me able to help her get in touch with her family. Otherwise no complaints  ROS: denies pain  Objective:   No results found. Recent Labs    10/26/20 0026 10/27/20 0510  WBC 10.4 7.0  HGB 13.5 12.7  HCT 42.0 40.6  PLT 164 134*   Recent Labs    10/26/20 0026 10/27/20 0510  NA 134* 137  K 3.9 4.0  CL 90* 93*  CO2 35* 39*  GLUCOSE 122* 112*  BUN 21 24*  CREATININE 1.08* 1.07*  CALCIUM 8.9 8.7*    Intake/Output Summary (Last 24 hours) at 10/27/2020 1316 Last data filed at 10/27/2020 0715 Gross per 24 hour  Intake 336 ml  Output 100 ml  Net 236 ml        Physical Exam: Vital Signs Blood pressure (!) 142/72, pulse 99, temperature 98.2 F (36.8 C), temperature source Oral, resp. rate 16, height 4' 11.75" (1.518 m), weight 92.7 kg, SpO2 100 %. Gen: no distress, normal appearing HEENT: oral mucosa pink and moist, NCAT Cardio: Reg rate Chest: normal effort, normal rate of breathing Abd: soft, non-distended Ext: no edema Psych: pleasant, normal affect Skin: intact Musculoskeletal:        General: Swelling (1+ pedall and pretib) present. No tenderness.     Cervical back: Normal range of motion and neck supple.  Skin:    General: Skin is warm and dry.  Neurological:     Mental Status: She is alert and oriented to person, place, and time.     Comments: Alert and oriented to day, year, month with minimal cues.  Motor: 4+/5 throughout  Psychiatric:        Behavior: Behavior is slowed.        Cognition and Memory: Cognition is impaired.     Assessment/Plan: 1. Functional deficits which require 3+ hours per day of interdisciplinary therapy in a comprehensive inpatient rehab setting.  Physiatrist is providing close  team supervision and 24 hour management of active medical problems listed below.  Physiatrist and rehab team continue to assess barriers to discharge/monitor patient progress toward functional and medical goals  Care Tool:  Bathing    Body parts bathed by patient: Right arm,Left arm,Chest,Abdomen,Front perineal area,Buttocks,Right upper leg,Left upper leg,Right lower leg,Left lower leg,Face         Bathing assist Assist Level: Supervision/Verbal cueing     Upper Body Dressing/Undressing Upper body dressing   What is the patient wearing?: Pull over shirt    Upper body assist Assist Level: Supervision/Verbal cueing    Lower Body Dressing/Undressing Lower body dressing      What is the patient wearing?: Underwear/pull up,Pants     Lower body assist Assist for lower body dressing: Minimal Assistance - Patient > 75%     Toileting Toileting    Toileting assist Assist for toileting: Minimal Assistance - Patient > 75%     Transfers Chair/bed transfer  Transfers assist     Chair/bed transfer assist  level: Minimal Assistance - Patient > 75%     Locomotion Ambulation   Ambulation assist              Walk 10 feet activity   Assist           Walk 50 feet activity   Assist           Walk 150 feet activity   Assist           Walk 10 feet on uneven surface  activity   Assist           Wheelchair     Assist               Wheelchair 50 feet with 2 turns activity    Assist            Wheelchair 150 feet activity     Assist          Blood pressure (!) 142/72, pulse 99, temperature 98.2 F (36.8 C), temperature source Oral, resp. rate 16, height 4' 11.75" (1.518 m), weight 92.7 kg, SpO2 100 %.    Medical Problem List and Plan: 1.  Decrease in endurance, fatigue, unsteady gait, difficulty following commands, intermittent confusion secondary to encephalopathy.             -patient may shower              -ELOS/Goals: 10 to 14 days/supervision.             Initial CIR evals today 2.  Antithrombotics: -DVT/anticoagulation:  Pharmaceutical: Continue Lovenox- ambulating 30 feet             -antiplatelet therapy: ASA 3. Chronic LBP/Pain Management: N/A---used Celebrex prn.             --gets ESI by Dr. Nelva Bush             Monitor with increased exertion 4. Mood: LCSW to follow for evaluation and support.              -antipsychotic agents: N/A 5. Neuropsych: This patient is not fully capable of making decisions on her  own behalf. 6. Skin/Wound Care: Routine pressure relief measures.  7. Fluids/Electrolytes/Nutrition: Strict I/Os. Low salt diet.             Electrolytes reviewed and stable 3/17 8. Fluid overload: Off lasix             --had stopped HCTZ due to frequency.              Daily weights 9. Hypercarbic respiratory failure/OSA?/COPD?: H/o pulmonary nodules with cavitation due to PNA in 2007?             --stopped using inhalers due to cough. Not on MDI at home.               --Continues to have hypoxia--dropped to mid 70's this am on RA-->wean supplemental oxygen as tolerated             --encourage pulmonary hygiene. 10. Hyponatremia: Likely due to diuresis.             Normalized to 137 on 3/17, repeat Monday 11. Morbid obesity (BMI 40.25): Encourage weight loss 12. Mild AKI: repeat Cr Monday  LOS: 1 days A FACE TO FACE EVALUATION WAS PERFORMED  Adaley Kiene P Seiji Wiswell 10/27/2020, 1:16 PM

## 2020-10-27 NOTE — Progress Notes (Signed)
[facility] Individual Statement of Services  Patient Name:  [name]  Date:  10/27/2020  Welcome to the [facility].  Our goal is to provide you with an individualized program based on your diagnosis and situation, designed to meet your specific needs.  With this comprehensive rehabilitation program, you will be expected to participate in at least 3 hours of rehabilitation therapies Monday-Friday, with modified therapy programming on the weekends.  Your rehabilitation program will include the following services:  Physical Therapy (PT), Occupational Therapy (OT), Speech Therapy (ST), 24 hour per day rehabilitation nursing, Therapeutic Recreaction (TR), Care Coordinator, Rehabilitation Medicine, Nutrition Services and Pharmacy Services  Weekly team conferences will be held on Wednesday to discuss your progress.  Your Inpatient Rehabilitation Care Coordinator will talk with you frequently to get your input and to update you on team discussions.  Team conferences with you and your family in attendance may also be held.  Expected length of stay: 5-8 days  Overall anticipated outcome: independent with device  Depending on your progress and recovery, your program may change. Your Inpatient Rehabilitation Care Coordinator will coordinate services and will keep you informed of any changes. Your Inpatient Rehabilitation Care Coordinator's name and contact numbers are listed  below.  The following services may also be recommended but are not provided by the [facility] will be made to provide these services after discharge if needed.  Arrangements include referral to agencies that provide these services.  Your insurance has been verified to be: [place] primary doctor is:  Shirline Frees  Pertinent  information will be shared with your doctor and your insurance company.  Inpatient Rehabilitation Care Coordinator:  Ovidio Kin, [place] or Emilia Beck  Information discussed with and copy given to patient by: Elease Hashimoto, 10/27/2020, 9:57 AM

## 2020-10-28 LAB — GLUCOSE, CAPILLARY: Glucose-Capillary: 107 mg/dL — ABNORMAL HIGH (ref 70–99)

## 2020-10-28 NOTE — Progress Notes (Signed)
Pt expressed anxiety and fear at the prospect of CPAP therapy.  After educating patient on device and therapy, patient agreed to trial CPAP with a nasal mask at low settings.  CPAP set to [place] with smart ramp (starting at [facility]) with 3L bleed in.  Patient became anxious and repeatedly expressed discomfort at [facility].  Reduced setting to [place] and pt agreed to trial CPAP therapy as long as she could tolerate.  As the patient was on a nasal cannula coached patient to call for assistance if she removed mask on her own so her nasal cannula could be placed back on.

## 2020-10-28 NOTE — Progress Notes (Signed)
PROGRESS NOTE   Subjective/Complaints: Patient's chart reviewed- No issues reported overnight Vitals signs stable Spoke with sister by phone- she is concerned patient did not receive CPAP last night- placed RT order to make sure she gets it tonight  ROS: denies pain, breathing well.   Objective:   No results found. Recent Labs    10/26/20 0026 10/27/20 0510  WBC 10.4 7.0  HGB 13.5 12.7  HCT 42.0 40.6  PLT 164 134*   Recent Labs    10/26/20 0026 10/27/20 0510  NA 134* 137  K 3.9 4.0  CL 90* 93*  CO2 35* 39*  GLUCOSE 122* 112*  BUN 21 24*  CREATININE 1.08* 1.07*  CALCIUM 8.9 8.7*    Intake/Output Summary (Last 24 hours) at 10/28/2020 1146 Last data filed at 10/28/2020 0900 Gross per 24 hour  Intake 576 ml  Output --  Net 576 ml        Physical Exam: Vital Signs Blood pressure 132/65, pulse 92, temperature 98.4 F (36.9 C), temperature source Oral, resp. rate 14, height 4' 11.75" (1.518 m), weight 92.7 kg, SpO2 98 %. Gen: no distress, normal appearing HEENT: oral mucosa pink and moist, NCAT Cardio: Reg rate Chest: normal effort, normal rate of breathing Abd: soft, non-distended Ext: no edema Musculoskeletal:        General: Swelling (1+ pedall and pretib) present. No tenderness.     Cervical back: Normal range of motion and neck supple.  Skin:    General: Skin is warm and dry.  Neurological:     Mental Status: She is alert and oriented to person, place, and time.     Comments: Alert and oriented to day, year, month with minimal cues.  Motor: 4+/5 throughout  Psychiatric:        Behavior: Behavior is slowed.        Cognition and Memory: Cognition is impaired.     Assessment/Plan: 1. Functional deficits which require 3+ hours per day of interdisciplinary therapy in a comprehensive inpatient rehab setting.  Physiatrist is providing close team supervision and 24 hour management of active medical  problems listed below.  Physiatrist and rehab team continue to assess barriers to discharge/monitor patient progress toward functional and medical goals  Care Tool:  Bathing    Body parts bathed by patient: Right arm,Left arm,Chest,Abdomen,Front perineal area,Buttocks,Right upper leg,Left upper leg,Right lower leg,Left lower leg,Face         Bathing assist Assist Level: Supervision/Verbal cueing     Upper Body Dressing/Undressing Upper body dressing   What is the patient wearing?: Pull over shirt    Upper body assist Assist Level: Supervision/Verbal cueing    Lower Body Dressing/Undressing Lower body dressing      What is the patient wearing?: Underwear/pull up,Pants     Lower body assist Assist for lower body dressing: Minimal Assistance - Patient > 75%     Toileting Toileting    Toileting assist Assist for toileting: Minimal Assistance - Patient > 75%     Transfers Chair/bed transfer  Transfers assist     Chair/bed transfer assist level: Contact Guard/Touching assist     Locomotion Ambulation   Ambulation assist  Assist level: Contact Guard/Touching assist Assistive device: Walker-rolling Max distance: 100 ft   Walk 10 feet activity   Assist     Assist level: Contact Guard/Touching assist Assistive device: Walker-rolling   Walk 50 feet activity   Assist    Assist level: Contact Guard/Touching assist Assistive device: Walker-rolling    Walk 150 feet activity   Assist Walk 150 feet activity did not occur: Safety/medical concerns (Standing rest break)         Walk 10 feet on uneven surface  activity   Assist Walk 10 feet on uneven surfaces activity did not occur: Safety/medical concerns         Wheelchair     Assist Will patient use wheelchair at discharge?: No             Wheelchair 50 feet with 2 turns activity    Assist            Wheelchair 150 feet activity     Assist           Blood pressure 132/65, pulse 92, temperature 98.4 F (36.9 C), temperature source Oral, resp. rate 14, height 4' 11.75" (1.518 m), weight 92.7 kg, SpO2 98 %.    Medical Problem List and Plan: 1.  Decrease in endurance, fatigue, unsteady gait, difficulty following commands, intermittent confusion secondary to encephalopathy.             -patient may shower             -ELOS/Goals: 10 to 14 days/supervision.             Continue CIR 2.  Antithrombotics: -DVT/anticoagulation:  Pharmaceutical: Continue Lovenox- ambulating 30 feet: discussed              -antiplatelet therapy: ASA 3. Chronic LBP/Pain Management: N/A---used Celebrex prn- not ordered currently, pain is well controlled             --gets ESI by Dr. Nelva Bush             Monitor with increased exertion 4. Mood: LCSW to follow for evaluation and support.              -antipsychotic agents: N/A 5. Neuropsych: This patient is not fully capable of making decisions on her  own behalf. 6. Skin/Wound Care: Routine pressure relief measures.  7. Fluids/Electrolytes/Nutrition: Strict I/Os. Low salt diet.             Electrolytes reviewed and stable 3/17 8. Fluid overload: Off lasix             --had stopped HCTZ due to frequency.              Daily weights 9. Hypercarbic respiratory failure/OSA?/COPD?: H/o pulmonary nodules with cavitation due to PNA in 2007?             --stopped using inhalers due to cough. Not on MDI at home.               --Continues to have hypoxia--dropped to mid 70's this am on RA-->wean supplemental oxygen as tolerated             --encourage pulmonary hygiene.  -Placed RT consult so we can get CPAP for her 3/18.  10. Hyponatremia: Likely due to diuresis.             Normalized to 137 on 3/17, repeat Monday 11. Morbid obesity (BMI 40.25): Encourage weight loss 12. Mild AKI: repeat Cr Monday 13. Disposition: Discussed plan  for team conference at [place] on Wednesday with her sister and patient. Gave her estimate of  10-14 days.  LOS: 2 days A FACE TO FACE EVALUATION WAS PERFORMED  Clide Deutscher Danh Bayus 10/28/2020, 11:46 AM

## 2020-10-28 NOTE — Progress Notes (Signed)
Speech Language Pathology Daily Session Note  Patient Details  Name: [name] MRN: [id number] Date of Birth: 07/09/1933  Today's Date: 10/28/2020 SLP Individual Time: 1005-1045 SLP Individual Time Calculation (min): 40 min  Short Term Goals: Week 1: SLP Short Term Goal 1 (Week 1): Patient will perform complex level problem solving tasks in areas of financial and medication management with supervision to mod I SLP Short Term Goal 2 (Week 1): Patient will demonstrate awareness to and self correct/attempt to self correct during problem solving tasks with mod I. SLP Short Term Goal 3 (Week 1): Patient will demonatrate adequate anticipatory awareness during discussion on anticipated needs upon discharge.  Skilled Therapeutic Interventions:  Pt was seen for skilled ST targeting goals for cognition.  Upon arrival, pt was seated in recliner, awake, alert, and agreeable to participating in treatment.  SLP facilitated the session with a novel scheduling task to address problem solving goals.  Pt needed mod assist verbal cues for task organization and error awareness due to working memory deficits.  Pt was left in recliner with all needs within reach.  Continue per current plan of care.    Pain Pain Assessment Pain Scale: 0-10 Pain Score: 0-No pain  Therapy/Group: Individual Therapy  Colburn Asper, Selinda Orion 10/28/2020, 12:30 PM

## 2020-10-28 NOTE — IPOC Note (Signed)
Overall Plan of Care [facility]) Patient Details Name: [name] MRN: [id number] DOB: [birth year]  Admitting Diagnosis: Encephalopathy  Hospital Problems: Principal Problem:   Encephalopathy Active Problems:   Stroke (cerebrum) ([place])     Functional Problem List: Nursing Behavior,Bladder,Bowel,Endurance,Medication Management,Pain,Safety,Skin Integrity  PT Balance,Edema,Endurance,Motor,Safety  OT Balance,Endurance,Motor,Cognition  SLP Cognition  TR         Basic ADL's: OT Bathing,Dressing,Toileting     Advanced  ADL's: OT Simple Meal Preparation,Light Housekeeping     Transfers: PT Bed Mobility,Bed to [employer]  OT Toilet,Tub/Shower     Locomotion: PT Ambulation,Stairs     Additional Impairments: OT None  SLP None      TR      Anticipated Outcomes Item Anticipated Outcome  Self Feeding independent  Swallowing  N/A   Basic self-care  Mod I  Toileting  Mod I   Bathroom Transfers Mod I  Bowel/Bladder  Supervision  Transfers  mod I  Locomotion  mod I  Communication  N/A  Cognition  Mod I  Pain  < 3  Safety/Judgment  Supervision   Therapy Plan: PT Intensity: Minimum of 1-2 x/day ,45 to 90 minutes PT Frequency: 5 out of 7 days PT Duration Estimated Length of Stay: 5-10 days OT Intensity: Minimum of 1-2 x/day, 45 to 90 minutes OT Frequency: 5 out of 7 days OT Duration/Estimated Length of Stay: 5-7 days SLP Intensity: Minumum of 1-2 x/day, 30 to 90 minutes SLP Frequency: 3 to 5 out of 7 days SLP Duration/Estimated Length of Stay: 5-7 days   Due to the current state of emergency, patients may not be receiving their 3-hours of Medicare-mandated therapy.   Team Interventions: Nursing Interventions Patient/Family Education,Bladder Management,Bowel Management,Disease Management/Prevention,Pain Management,Medication Management,Skin Care/Wound Management,Dysphagia/Aspiration Precaution Training,Discharge Planning,Psychosocial Support  PT interventions  Ambulation/gait training,Balance/vestibular training,Disease management/prevention,Discharge planning,Functional mobility training,Neuromuscular re-education,Skin care/wound management,Psychosocial support,Therapeutic Activities,Therapeutic Exercise,Wheelchair propulsion/positioning,Visual/perceptual remediation/compensation,UE/LE Strength taining/ROM,Splinting/orthotics,Cognitive remediation/compensation,DME/adaptive equipment instruction,Pain management,Patient/family education,Stair training,UE/LE Coordination activities,Community reintegration  OT Interventions Balance/vestibular training,Cognitive remediation/compensation,Discharge planning,Functional mobility training,DME/adaptive equipment instruction,Patient/family education,Psychosocial support,Therapeutic Activities,Self Care/advanced ADL retraining,Therapeutic Exercise,UE/LE Strength taining/ROM  SLP Interventions Cognitive remediation/compensation,Medication managment,Patient/family education  TR Interventions    SW/CM Interventions Discharge Planning,Psychosocial Support,Patient/Family Education   Barriers to Discharge MD  Medical stability  Nursing Inaccessible home environment,Decreased caregiver support,Home environment access/layout,Incontinence,Lack of/limited family support,Medication compliance,Behavior,Nutrition means,Weight    PT Decreased caregiver support,Insurance for SNF coverage,Lack of/limited family support,New oxygen    OT      SLP Decreased caregiver support patient lives alone  SW Decreased caregiver support 2 weeks of coverage at discharge   Team Discharge Planning: Destination: PT-Home ,OT- Home , SLP-Home Projected Follow-up: PT-Home health PT, OT-  Home health OT, SLP-None Projected Equipment Needs: PT-To be determined, OT- None recommended by OT, SLP-None recommended by SLP Equipment Details: PT-Pt owns rollator, OT-pt has DME for home bathroom Patient/family involved in discharge planning: PT- Patient,   OT-Patient, SLP-Patient  MD ELOS: 10-14 days S Medical Rehab Prognosis:  Excellent Assessment:  [name] is an 87 year old woman who is admitted to CIR with decrease in endurance, fatigue, unsteady gait, difficulty following commands, intermittent confusion secondary to encephalopathy. She is continued on Lovenox for limited mobility, celebrex for chronic back pain. Labs are being monitored regularly given AKI and hyponatremia.     See Team Conference Notes for weekly updates to the plan of care

## 2020-10-28 NOTE — Plan of Care (Signed)
  Problem: Consults Goal: RH STROKE PATIENT EDUCATION Description: See Patient Education module for education specifics  Outcome: Progressing Goal: Nutrition Consult-if indicated Outcome: Progressing Goal: Diabetes Guidelines if Diabetic/Glucose > 140 Description: If diabetic or lab glucose is > 140 mg/dl - Initiate Diabetes/Hyperglycemia Guidelines & Document Interventions  Outcome: Progressing   Problem: RH BOWEL ELIMINATION Goal: RH STG MANAGE BOWEL WITH ASSISTANCE Description: STG Manage Bowel with supervision Assistance. Outcome: Progressing Goal: RH STG MANAGE BOWEL W/MEDICATION W/ASSISTANCE Description: STG Manage Bowel with Medication with supervision Assistance. Outcome: Progressing   Problem: RH BLADDER ELIMINATION Goal: RH STG MANAGE BLADDER WITH ASSISTANCE Description: STG Manage Bladder With supervision Assistance Outcome: Progressing Goal: RH STG MANAGE BLADDER WITH MEDICATION WITH ASSISTANCE Description: STG Manage Bladder With Medication With supervision Assistance. Outcome: Progressing   Problem: RH SKIN INTEGRITY Goal: RH STG MAINTAIN SKIN INTEGRITY WITH ASSISTANCE Description: STG Maintain Skin Integrity With supervision Assistance. Outcome: Progressing Goal: RH STG ABLE TO PERFORM INCISION/WOUND CARE W/ASSISTANCE Description: STG Able To Perform Incision/Wound Care With supervision Assistance. Outcome: Progressing   Problem: RH SAFETY Goal: RH STG ADHERE TO SAFETY PRECAUTIONS W/ASSISTANCE/DEVICE Description: STG Adhere to Safety Precautions With supervision Assistance/Device. Outcome: Progressing Goal: RH STG DECREASED RISK OF FALL WITH ASSISTANCE Description: STG Decreased Risk of Fall With supervision Assistance. Outcome: Progressing   Problem: RH COGNITION-NURSING Goal: RH STG USES MEMORY AIDS/STRATEGIES W/ASSIST TO PROBLEM SOLVE Description: STG Uses Memory Aids/Strategies With cues and reminders to Problem Solve. Outcome: Progressing Goal: RH STG  ANTICIPATES NEEDS/CALLS FOR ASSIST W/ASSIST/CUES Description: STG Anticipates Needs/Calls for Assist With Cues and reminders. Outcome: Progressing   Problem: RH PAIN MANAGEMENT Goal: RH STG PAIN MANAGED AT OR BELOW PT'S PAIN GOAL Description: <3 on a 0-10 pain scale. Outcome: Progressing   Problem: RH KNOWLEDGE DEFICIT Goal: RH STG INCREASE KNOWLEDGE OF STROKE PROPHYLAXIS Description: Patient will be able to demonstrate knowledge of medications used to prevent future strokes with educational materials and handouts provided by staff. Outcome: Progressing

## 2020-10-29 DIAGNOSIS — E871 Hypo-osmolality and hyponatremia: Secondary | ICD-10-CM

## 2020-10-29 DIAGNOSIS — G4733 Obstructive sleep apnea (adult) (pediatric): Secondary | ICD-10-CM

## 2020-10-29 DIAGNOSIS — Z8639 Personal history of other endocrine, nutritional and metabolic disease: Secondary | ICD-10-CM

## 2020-10-29 NOTE — Progress Notes (Signed)
PROGRESS NOTE   Subjective/Complaints: No problems overnight. Didn't tolerate CPAP  ROS: Limited due to cognitive/behavioral    Objective:   No results found. Recent Labs    10/27/20 0510  WBC 7.0  HGB 12.7  HCT 40.6  PLT 134*   Recent Labs    10/27/20 0510  NA 137  K 4.0  CL 93*  CO2 39*  GLUCOSE 112*  BUN 24*  CREATININE 1.07*  CALCIUM 8.7*    Intake/Output Summary (Last 24 hours) at 10/29/2020 1541 Last data filed at 10/29/2020 0800 Gross per 24 hour  Intake 620 ml  Output --  Net 620 ml        Physical Exam: Vital Signs Blood pressure (!) 137/59, pulse 88, temperature 98.4 F (36.9 C), temperature source Oral, resp. rate 17, height 4' 11.75" (1.518 m), weight 99.4 kg, SpO2 92 %. Constitutional: No distress . Vital signs reviewed. HEENT: EOMI, oral membranes moist Neck: supple Cardiovascular: RRR without murmur. No JVD    Respiratory/Chest: CTA Bilaterally without wheezes or rales. Normal effort    GI/Abdomen: BS +, non-tender, non-distended Ext: no clubbing, cyanosis, or edema Psych: delayed Musculoskeletal:        General: Swelling (1+ pedall and pretib) present. No tenderness.     Cervical back: Normal range of motion and neck supple.  Skin:    General: Skin is warm and dry.  Neurological:     Mental Status: alert and follows basic commands. .     Comments: Alert and oriented to day, person.  Motor: 4+/5 throughout      Assessment/Plan: 1. Functional deficits which require 3+ hours per day of interdisciplinary therapy in a comprehensive inpatient rehab setting.  Physiatrist is providing close team supervision and 24 hour management of active medical problems listed below.  Physiatrist and rehab team continue to assess barriers to discharge/monitor patient progress toward functional and medical goals  Care Tool:  Bathing    Body parts bathed by patient: Right arm,Left  arm,Chest,Abdomen,Right upper leg,Left upper leg,Face   Body parts bathed by helper: Left lower leg,Front perineal area,Buttocks,Right lower leg     Bathing assist Assist Level: Moderate Assistance - Patient 50 - 74%     Upper Body Dressing/Undressing Upper body dressing   What is the patient wearing?: Bra,Pull over shirt    Upper body assist Assist Level: Moderate Assistance - Patient 50 - 74%    Lower Body Dressing/Undressing Lower body dressing      What is the patient wearing?: Underwear/pull up,Pants     Lower body assist Assist for lower body dressing: Maximal Assistance - Patient 25 - 49%     Toileting Toileting    Toileting assist Assist for toileting: Minimal Assistance - Patient > 75%     Transfers Chair/bed transfer  Transfers assist     Chair/bed transfer assist level: Contact Guard/Touching assist     Locomotion Ambulation   Ambulation assist      Assist level: Supervision/Verbal cueing Assistive device: Walker-rolling Max distance: 100 ft   Walk 10 feet activity   Assist     Assist level: Contact Guard/Touching assist Assistive device: Walker-rolling   Walk 50 feet activity  Assist    Assist level: Contact Guard/Touching assist Assistive device: Walker-rolling    Walk 150 feet activity   Assist Walk 150 feet activity did not occur: Safety/medical concerns (Standing rest break)         Walk 10 feet on uneven surface  activity   Assist Walk 10 feet on uneven surfaces activity did not occur: Safety/medical concerns         Wheelchair     Assist Will patient use wheelchair at discharge?: No             Wheelchair 50 feet with 2 turns activity    Assist            Wheelchair 150 feet activity     Assist          Blood pressure (!) 137/59, pulse 88, temperature 98.4 F (36.9 C), temperature source Oral, resp. rate 17, height 4' 11.75" (1.518 m), weight 99.4 kg, SpO2 92 %.    Medical  Problem List and Plan: 1.  Decrease in endurance, fatigue, unsteady gait, difficulty following commands, intermittent confusion secondary to encephalopathy.             -patient may shower             -ELOS/Goals: 10 to 14 days/supervision.             Continue CIR 2.  Antithrombotics: -DVT/anticoagulation:  Pharmaceutical: Continue Lovenox- ambulating 30 feet: discussed              -antiplatelet therapy: ASA 3. Chronic LBP/Pain Management: N/A---used Celebrex prn- not ordered currently, pain is well controlled             --gets ESI by Dr. Nelva Bush             Monitor with increased exertion 4. Mood: LCSW to follow for evaluation and support.              -antipsychotic agents: N/A 5. Neuropsych: This patient is not fully capable of making decisions on her  own behalf. 6. Skin/Wound Care: Routine pressure relief measures.  7. Fluids/Electrolytes/Nutrition: Strict I/Os. Low salt diet.             Electrolytes reviewed and stable 3/17 8. Fluid overload: Off lasix             --had stopped HCTZ due to frequency.               Filed Weights   10/26/20 1811 10/29/20 0300  Weight: 92.7 kg 99.4 kg    -watch for trend. Question validity of today's 9. Hypercarbic respiratory failure/OSA?/COPD?: H/o pulmonary nodules with cavitation due to PNA in 2007?             --stopped using inhalers due to cough. Not on MDI at home.               --Continues to have hypoxia--dropped to mid 70's this am on RA-->wean supplemental oxygen as tolerated             --encourage pulmonary hygiene.  -continue CPAP as able. Educating patient 10. Hyponatremia: Likely due to diuresis.             Normalized to 137 on 3/17, repeat Monday 11. Morbid obesity (BMI 40.25): Encourage weight loss 12. Mild AKI: repeat Cr Monday 13. Disposition: Discussed plan for team conference at [place] on Wednesday with her sister and patient. Gave her estimate of 10-14 days.  LOS: 3 days A  FACE TO FACE EVALUATION WAS  PERFORMED  Meredith Staggers 10/29/2020, 3:41 PM

## 2020-10-29 NOTE — Progress Notes (Signed)
RN called to room. Patient removed her CPAP mask and informed RN she does not like /cannot tolerate it. Nasal cannula placed back on. Respiratory therapist informed

## 2020-10-29 NOTE — Progress Notes (Signed)
Occupational Therapy Session Note  Patient Details  Name: [name] MRN: [id number] Date of Birth: Dec 29, 1932  Today's Date: 10/29/2020 OT Individual Time: 1330-1415 OT Individual Time Calculation (min): 45 min    Short Term Goals: Week 1:  OT Short Term Goal 1 (Week 1): STGS = LTGs  Skilled Therapeutic Interventions/Progress Updates:    Treatment session with focus on use of AE for LB dressing to increase independence.  Pt received upright in recliner with niece present visiting.  Therapist educated pt on use of AE (reacher, sock aid, shoe horn, long handled sponge) for LB bathing and dressing.  Pt reports difficulty with LB bathing and dressing due to increased weight with difficulty bending forward and instability when bending over.  Utilized reacher to practice LB dressing with pt able to don pants with supervision, CGA when standing to pull over hips.  Utilized sock aid without assistance, pt reports having one in 2010 after hip surgery.  Pt able to don shoes with increased time with use of shoe horn.  Pt would benefit from additional practice and recommendations for how to purchase items.  Pt asking questions about AE for hygiene post toileting.  Discussed toilet aid options with pictures of each, pt to further problem solve during next OT session.  Pt remained seated upright in recliner at end of session with all needs in reach.  Therapy Documentation Precautions:  Precautions Precautions: Fall Precaution Comments: Monitor HR and O2 - only on oxygen since admission Restrictions Weight Bearing Restrictions: No  Pain:  Pt with no c/o pain   Therapy/Group: Individual Therapy  Simonne Come 10/29/2020, 2:58 PM

## 2020-10-29 NOTE — Progress Notes (Signed)
Physical Therapy Session Note  Patient Details  Name: [name] MRN: [id number] Date of Birth: 1932-09-16  Today's Date: 10/29/2020 PT Individual Time: 0900-1000 PT Individual Time Calculation (min): 60 min   Short Term Goals: Week 1:  PT Short Term Goal 1 (Week 1): STG = LTG due to ELOS  Skilled Therapeutic Interventions/Progress Updates:    Pt received sitting upright in recliner - awake and agreeable to therapy. No reports of pain but does report generalized fatigue and weakness. Pt also upset that she didn't get any PT/OT yesterday - apologized for scheduling error and pt understanding. Pt on 3L wall O2 via nasal canula on arrival. Stand<>pivot transfer with CGA and RW to w/c and transported downstairs to main rehab gym for time management. Performed additional stand<>pivot transfer with CGA and RW to mat table. Assessed Spo2 while resting on 3L reading 100% - on RA she desaturates to 89%. She remained on 2L O2 during session with O2 fluctuating b/w 90-96%. She benefits from education and cues for pursed lip breathing exercises. Performed repeated sit<>stands with CGA while unsupported from mat table. She also performed gait x163ft with CGA and RW - gait deficits include wide BOS, forward flexed trunk, and rounded shoulders. Pt reporting need to void during session - ambulated from main rehab gym to ADL apartment room, ~163ft, with CGA and RW. Pt continent of bladder while on toilet, able to perform pericare with setupA. Performed furniture transfer on sofa couch with CGA and RW. Completed session with repeated sit<>stands while holding 2# dowel rod with task overlay for shldr press after each stand. Pt returned to her room with totalA in w/c - ambulated in her room with CGA and RW to her recliner and reconnected to wall O2 on 2L. RN present in room and made aware of oxygen response to exercise.  Therapy Documentation Precautions:  Precautions Precautions: Fall Precaution Comments: Monitor  HR and O2 - only on oxygen since admission Restrictions Weight Bearing Restrictions: No   Therapy/Group: Individual Therapy  Elia Keenum P Mishawn Hemann 10/29/2020, 10:12 AM

## 2020-10-29 NOTE — Progress Notes (Signed)
Occupational Therapy Session Note  Patient Details  Name: [name] MRN: [id number] Date of Birth: Apr 05, 1933  Today's Date: 10/29/2020 OT Individual Time: 0086-7619 OT Individual Time Calculation (min): 70 min    Short Term Goals: Week 1:  OT Short Term Goal 1 (Week 1): STGS = LTGs  Skilled Therapeutic Interventions/Progress Updates:    Pt seen for make-up visit d/t missing therapy on 3/18. Pt received sitting in recliner, agreeable to therapy and eager for a shower. Pt reported feeling weaker today while also informing of increased walking she did a couple of days ago. OT session focused on ADL retraining, functional endurance, transfers, and balance. Pt ambulated to bathroom with walk-in shower (ledge). Pt ambulated with CGA using RW when knees buckled with OT providing max assist to correct balance and pt able to continue with shower transfer with min A using grab bars. Pt verbalized anxiety with knee buckling with OT coaching through deep breathing strategies for calming. Pt completed bathing sit<>stand with grab bars and mod A for peri/buttocks hygiene and lower BLEs. Following shower, pt completed stand pivot transfer to w/c outside of shower with min A. Completed dressing with increased time due to mild SOB and overall mod A. Pt noted to increase strength with sit<>stand during dressing. Pt requested to toilet, ambulating ~10 feet to/from bathroom with CGA using RW. Pt required min A for buttock hygiene following void. Pt returned to recliner chair and left with all need in reach and O2 via nasal canula donned.   Therapy Documentation Precautions:  Precautions Precautions: Fall Precaution Comments: Monitor HR and O2 - only on oxygen since admission Restrictions Weight Bearing Restrictions: No General:   Vital Signs:  Pain:   ADL: ADL Eating: Set up Grooming: Setup Upper Body Bathing: Supervision/safety Where Assessed-Upper Body Bathing: Shower Lower Body Bathing:  Supervision/safety Where Assessed-Lower Body Bathing: Shower Upper Body Dressing: Supervision/safety Where Assessed-Upper Body Dressing: Chair Lower Body Dressing: Minimal assistance Where Assessed-Lower Body Dressing: Chair Toileting: Minimal assistance Where Assessed-Toileting: Glass blower/designer: Close supervision Armed forces technical officer Method: Counselling psychologist: Energy manager: Close supervision Social research officer, government Method: Heritage manager: Systems analyst    Praxis   Exercises:   Other Treatments:     Therapy/Group: Individual Therapy  Duayne Cal 10/29/2020, 8:51 AM

## 2020-10-30 NOTE — Progress Notes (Signed)
Physical Therapy Session Note  Patient Details  Name: MARLENE BEIDLER MRN: [id number] Date of Birth: 03-Jan-1933  Today's Date: 10/30/2020 PT Individual Time: 7564-3329 PT Individual Time Calculation (min): 45 min   Short Term Goals: Week 1:  PT Short Term Goal 1 (Week 1): STG = LTG due to ELOS  Skilled Therapeutic Interventions/Progress Updates:    Pt on toilet at start of session, finishing up but requesting a female for assistance with cleaning up. Female RN made aware who arrived and assisted with pericare and then hand off of care with patient sitting in w/c. Pt reports poor nights rest, contributes to late night coffee. She remains verbose during session and benefits from gentle redirection for task attenuation. Reviewed tomorrow's therapy schedule to improve awareness and preparation for tomorrow. Pt on 2L portable O2 during session with spO2 >97% during therapy session and HR fluctuating b/w 90-120bpm. W/c transport for energy conservation to main rehab gym downstairs on 13M. Gait 2x151ft with CGA and RW - cues for increased thoracic extension and minimizing her "leaning" on RW. Performed standing there-ex in // bars with BUE support to bars and 1.5# ankle weights: -2x10 hip abduction, bilaterally -2x10 hip marches, bilaterally -2x10 standing heel raises, bilaterally *seated rest breaks needed b/w sets due to fatigue with guided pursed lip breathing   Returned in w/c with totalA for time management back upstairs - hand off of care to OT for her next therapy session.   Therapy Documentation Precautions:  Precautions Precautions: Fall Precaution Comments: Monitor HR and O2 - only on oxygen since admission Restrictions Weight Bearing Restrictions: No  Therapy/Group: Individual Therapy  Donelda Mailhot P Ocia Simek PT 10/30/2020, 3:11 PM

## 2020-10-30 NOTE — Progress Notes (Signed)
Physical Therapy Session Note  Patient Details  Name: [name] MRN: [id number] Date of Birth: 27-Sep-1932  Today's Date: 10/30/2020 PT Individual Time: 1104-1202 PT Individual Time Calculation (min): 58 min   Short Term Goals: Week 1:  PT Short Term Goal 1 (Week 1): STG = LTG due to ELOS  Skilled Therapeutic Interventions/Progress Updates:    pt received in [facility] and initially declining therapy reporting she did not sleep well and did not think she could participate in therapy. PT educated on therapy scheduling and encouraged to participate in activity that she can tolerate and take breaks as needed. Pt then agreeable however did pleasently refuse to attempt gait training as she reported being too tired. Pt directed in seated BLE strengthening exercises 3x10 marching, LAQ, hip abduction/adduction  ankle pumps 2x10 BUE exercises of chest press, overhead press, bicep curls and wide clamps with visual demonstration to remain on task. Pt required rest breaks post each set 2/2 fatigue. Pt reported she is not very active at home and only completes household tasks as needed and uses motorized scooter when going to the grocery store. Pt also required frequent VC for attention to task and asked several of the same questions multiple times during session. Pt directed in gait at end of session for 72' CGA with RW and then reported she needed to use restroom, directed in gait training to toilet 10' CGA with Rolling walker. Pt able to void urine and clean self at SBA. Pt directed in gait back to chair CGA. Pt left in recliner, visitor present. All needs in reach and in good condition. Call light in hand. NCT aware.   Therapy Documentation Precautions:  Precautions Precautions: Fall Precaution Comments: Monitor HR and O2 - only on oxygen since admission Restrictions Weight Bearing Restrictions: No General:   Vital Signs:  Pain: Pain Assessment Pain Scale: 0-10 Pain Score: 0-No pain Mobility:    Locomotion :    Trunk/Postural Assessment :    Balance:   Exercises:   Other Treatments:      Therapy/Group: Individual Therapy  Junie Panning 10/30/2020, 12:40 PM

## 2020-10-30 NOTE — Progress Notes (Signed)
Pt very hesitant about wearing CPAP. RT was unaware of an order. When I arrived to patient's room, she was very anxious about wearing it and telling me "this is the wrong mask, it is too much pressure" I explained it is on the lowest setting and the masks are either just over her nose or over both her nose and mouth. She stated she definitely wouldn't be able to wear the mask over her nose and mouth. Pt is wearing CPAP at this moment and was encouraged to call if she needs to take the mask off so she can place her oxygen back on. Pt does not fully understand why CPAP is ordered and states she passed her sleep study.

## 2020-10-30 NOTE — Progress Notes (Signed)
Occupational Therapy Session Note  Patient Details  Name: [name] MRN: [id number] Date of Birth: [birth year]  Today's Date: 10/30/2020 OT Individual Time: 1450-1540 OT Individual Time Calculation (min): 50 min  and Today's Date: 10/30/2020 OT Missed Time: 25 Minutes Missed Time Reason: Patient fatigue   Short Term Goals: Week 1:  OT Short Term Goal 1 (Week 1): STGS = LTGs   Skilled Therapeutic Interventions/Progress Updates:    Pt greeted at time of session hand off from PT, pt feeling fatigued but agreeable to try OT session. Transported to gym for time and energy conservation. Standing dynamic activity at [facility] for 2 rounds of 1-3 minute intervals for reaching across midline and reaching out of BOS, O2 96% or higher after activity and checked throughout session. Discussion throughout and during rest breaks for DC planning, home set up, assist from friends/family, with pt stating she has a "walk in tub" with a door and walk in shower, has grab bars in tub but not walk in shower. Transported to ADL apartment and IADL retraining for reaching for items in fridge, cabinets, rearranging kitchen for safety and energy conservation. Able to retrieve items from cabinets and various heights with CGA for safety and no LOB. Pt politely declining further OT at this time, stating she was tired and wanted to go back to room. Transported dependent and walked short distance to recliner. Set up with O2 switched over to wall at 2L and alarm on call bell in reach all needs met. Missed 25 mins of OT d/t fatigue.   Therapy Documentation Precautions:  Precautions Precautions: Fall Precaution Comments: Monitor HR and O2 - only on oxygen since admission Restrictions Weight Bearing Restrictions: No     Therapy/Group: Individual Therapy  Viona Gilmore 10/30/2020, 12:58 PM

## 2020-10-31 DIAGNOSIS — R739 Hyperglycemia, unspecified: Secondary | ICD-10-CM

## 2020-10-31 DIAGNOSIS — R0689 Other abnormalities of breathing: Secondary | ICD-10-CM

## 2020-10-31 DIAGNOSIS — I10 Essential (primary) hypertension: Secondary | ICD-10-CM

## 2020-10-31 LAB — BASIC METABOLIC PANEL
Anion gap: 5 (ref 5–15)
BUN: 14 mg/dL (ref 8–23)
CO2: 38 mmol/L — ABNORMAL HIGH (ref 22–32)
Calcium: 8.9 mg/dL (ref 8.9–10.3)
Chloride: 93 mmol/L — ABNORMAL LOW (ref 98–111)
Creatinine, Ser: 0.86 mg/dL (ref 0.44–1.00)
GFR, Estimated: >60 mL/min (ref >60)
Glucose, Bld: 141 mg/dL — ABNORMAL HIGH (ref 70–99)
Potassium: 4.3 mmol/L (ref 3.5–5.1)
Sodium: 136 mmol/L (ref 135–145)

## 2020-10-31 LAB — CBC
HCT: 41.6 % (ref 36.0–46.0)
Hemoglobin: 12.5 g/dL (ref 12.0–15.0)
MCH: 29.9 pg (ref 26.0–34.0)
MCHC: 30 g/dL (ref 30.0–36.0)
MCV: 99.5 fL (ref 80.0–100.0)
Platelets: 164 10*3/uL (ref 150–400)
RBC: 4.18 MIL/uL (ref 3.87–5.11)
RDW: 12.8 % (ref 11.5–15.5)
WBC: 6.1 10*3/uL (ref 4.0–10.5)
nRBC: 0 % (ref 0.0–0.2)

## 2020-10-31 MED ORDER — LISINOPRIL 5 MG PO TABS
2.5000 mg | ORAL_TABLET | Freq: Every day | ORAL | Status: DC
  Administered 2020-10-31 – 2020-11-05 (×6): 2.5 mg via ORAL
  Filled 2020-10-31 (×6): qty 1

## 2020-10-31 MED ORDER — MOMETASONE FURO-FORMOTEROL FUM 100-5 MCG/ACT IN AERO
2.0000 | INHALATION_SPRAY | Freq: Two times a day (BID) | RESPIRATORY_TRACT | Status: DC
  Administered 2020-11-01 – 2020-11-05 (×8): 2 via RESPIRATORY_TRACT
  Filled 2020-10-31: qty 8.8

## 2020-10-31 NOTE — Progress Notes (Signed)
Patient anxious,self removed CPap per assigned NT(Mary) refused CPap to be replaced.

## 2020-10-31 NOTE — Progress Notes (Incomplete)
Per RT patient connected to a continuous pulse machine,    1040pm Patient voiced concerns of feeling that the CPap makes her feel like she is suffocating and she afraid she would not wake up if she wears it. Educated on purpose of a CPAP and why this was ordered for her, she states she is aware but has anxiety about the machine and will talk with her primary physician once she leave.Patient agreed to prn medication to help her sleep tonight bur refused medication because she felt this writer 'just want to drug me up to place on the CPAP against my will'.. medication refused.Reassurance provided. Placed call bell and bed alarm on

## 2020-10-31 NOTE — Progress Notes (Signed)
Physical Therapy Session Note  Patient Details  Name: [name] MRN: [id number] Date of Birth: 12/11/1932  Today's Date: 10/31/2020 PT Individual Time: 0900-1009 + 1500-1515 PT Individual Time Calculation (min): 69 min  + 15 min  Short Term Goals: Week 1:  PT Short Term Goal 1 (Week 1): STG = LTG due to ELOS  Skilled Therapeutic Interventions/Progress Updates:     1st session: Pt received sitting in recliner, agreeable to therapy. No reports of pain but appears generally anxious regarding a lot of topics but mainly her oxygen needs and DC planning questions. Pt on 2L oxygen resting at 99%. Stand<>pivot transfer to w/c with CGA and RW and pt transported downstairs to main rehab gym with [place] for time management. Titrated oxygen to RA to assess oxygen needs. Slowly desaturating to 91% and then after completing 1x10 sit<>stands unsupported from w/c height while on RA, desaturates to 87%. Returned to 2L portable tank and she quickly rises back to 97%. Retrieved a rollator for her to trial as she used this prior to hospitalization. Pt then reporting urgent need to have a BM. Ambulated with CGA and rollator from main gym to ADL apartment room, ~182ft. Required CGA for controlled sitting to toilet and she was able to manage her lower body dressing. Pt continent of B + B but she requested a female to assist with pericare, RN notified to assist with needs. She then ambulated back to main rehab gym with CGA and rollator and needed a seated rest break due to fatigue. Instructed her on 6MWT rules and purposes.   6MWT - 33ft BUT needed seated rest after 269ft Minute 0: 100HR, 98% on 2L Minute 2:45: 110HR, 96% on 2L Minute 6: 107HR, 97% on 2L  - needed seated rest break at minute 2:45-4:15. Used rollator and needed CGA.  She then was instructed to complete further gait but with emphasis on safety awareness with rollator including locking brakes and how to safely sit and stand from the rollator seat.  She needed min cues for brake locking during these transfers. She then was returned upstairs to her room with [place] in her w/c. Ambulated within her room with rollator and CGA and ended session seated on recliner, reconnected to 2L wall O2. Needs within reach.   2nd session: Pt sleeping in recliner at start of session, awakens easily to voice and agreeable to therapy but reports she's tired and can't do too much. Pt on 1L on wall O2 on arrival, saturating b/w 89-92%. Connected to 1L portable tank. Gait 2x178ft with CGA and rollator, cues for safety awareness with rollator and cues needed for brake locking/unlocking. She required x1 seated rest break but O2 reading 94% while on 1L, HR 110. Pt requesting to finish session early as she's too tired to do anymore, contributes to poor nights rest. Returned to her room and she remained seated in recliner at end of session, reconnected to wall O2 at 1L. Needs within reach. She missed 15 minutes of skilled therapy due to fatigue.   Therapy Documentation Precautions:  Precautions Precautions: Fall Precaution Comments: Monitor HR and O2 - only on oxygen since admission Restrictions Weight Bearing Restrictions: No General: PT Amount of Missed Time (min): 15 Minutes PT Missed Treatment Reason: Patient fatigue  Therapy/Group: Individual Therapy  Ediberto Sens P Dougles Kimmey PT 10/31/2020, 7:49 AM

## 2020-10-31 NOTE — Progress Notes (Signed)
Patient placed on overnight pulse ox while pt on 1L [place]. Pt refused to wear CPAP tonight, she states it "smothers her".

## 2020-10-31 NOTE — Progress Notes (Signed)
Patient ID: [name], female   DOB: [birth year], 87 y.o.   MRN: [id number]  Met with pt to inform team feel will be ready by Friday for discharge and contact her friend who will be staying with her to see if can come by then. Pt will cal her today. Pam-PA addressing O2 and was planning on calling her sister while in the room so can answer sister's questions. Pt doing well in therapies, but does not want CPAP or BIPAP due to claustrophobic and can't stand the mask on her face.

## 2020-10-31 NOTE — Progress Notes (Signed)
Speech Language Pathology Daily Session Note  Patient Details  Name: [name] MRN: [id number] Date of Birth: 09-Mar-1933  Today's Date: 10/31/2020 SLP Individual Time: 5859-2924 SLP Individual Time Calculation (min): 27 min  Short Term Goals: Week 1: SLP Short Term Goal 1 (Week 1): Patient will perform complex level problem solving tasks in areas of financial and medication management with supervision to mod I SLP Short Term Goal 2 (Week 1): Patient will demonstrate awareness to and self correct/attempt to self correct during problem solving tasks with mod I. SLP Short Term Goal 3 (Week 1): Patient will demonatrate adequate anticipatory awareness during discussion on anticipated needs upon discharge.  Skilled Therapeutic Interventions: Pt was seen for skilled ST targeting cognitive goals. Upon arrival, pt was somewhat anxious and very focused on putting clothes on before PT arrived. Therefore, SLP assisted pt with safe ambulation around room with use of rolling walker and Supervision A verbal cues were required for safety awareness (ex: reminder to lock breaks before standing), as well as problem solving clothing situation given she could not find bra she wanted. Once returned to her recliner, SLP facilitated session with functional conversation targeting d/c planning. Pt reported she is ready but somewhat apprehensive about returning home and required Supervision-Min A verbal cues for anticipatory awareness in regards to equipment needs when she returns home. Pt left sitting in recliner with needs within reach. Continue per current plan of care.          Pain Pain Assessment Pain Scale: 0-10 Pain Score: 0-No pain  Therapy/Group: Individual Therapy  Arbutus Leas 10/31/2020, 7:18 AM

## 2020-10-31 NOTE — Progress Notes (Signed)
PROGRESS NOTE   Subjective/Complaints: Patient seen sitting up in her chair this AM.  She states she slept well overnight.  She states she had a good weekend.   ROS: Appears to deny CP, SOB, N/V/D.  Objective:   No results found. Recent Labs    10/31/20 0711  WBC 6.1  HGB 12.5  HCT 41.6  PLT 164   Recent Labs    10/31/20 0711  NA 136  K 4.3  CL 93*  CO2 38*  GLUCOSE 141*  BUN 14  CREATININE 0.86  CALCIUM 8.9    Intake/Output Summary (Last 24 hours) at 10/31/2020 1045 Last data filed at 10/31/2020 0735 Gross per 24 hour  Intake 1200 ml  Output -  Net 1200 ml        Physical Exam: Vital Signs Blood pressure (!) 156/77, pulse 83, temperature 98.7 F (37.1 C), resp. rate 16, height 4' 11.75" (1.518 m), weight 93.3 kg, SpO2 98 %. Constitutional: No distress . Vital signs reviewed. HENT: Normocephalic.  Atraumatic. Eyes: EOMI. No discharge. Cardiovascular: No JVD.  RRR. Respiratory: Normal effort.  No stridor.  Bilateral clear to auscultation. GI: Non-distended.  BS +. Skin: Warm and dry.  Intact. Psych: Delayed, tangential at times. Musc: Bilaterally LE edema.  No tenderness in extremities. Neuro: Alert and oriented x3. Motor: 4+/5 throughout, stable  Assessment/Plan: 1. Functional deficits which require 3+ hours per day of interdisciplinary therapy in a comprehensive inpatient rehab setting.  Physiatrist is providing close team supervision and 24 hour management of active medical problems listed below.  Physiatrist and rehab team continue to assess barriers to discharge/monitor patient progress toward functional and medical goals  Care Tool:  Bathing    Body parts bathed by patient: Right arm,Left arm,Chest,Abdomen,Right upper leg,Left upper leg,Face   Body parts bathed by helper: Buttocks,Front perineal area     Bathing assist Assist Level: Moderate Assistance - Patient 50 - 74%     Upper  Body Dressing/Undressing Upper body dressing   What is the patient wearing?: Bra,Pull over shirt    Upper body assist Assist Level: Moderate Assistance - Patient 50 - 74%    Lower Body Dressing/Undressing Lower body dressing      What is the patient wearing?: Underwear/pull up,Pants     Lower body assist Assist for lower body dressing: Maximal Assistance - Patient 25 - 49%     Toileting Toileting    Toileting assist Assist for toileting: Minimal Assistance - Patient > 75%     Transfers Chair/bed transfer  Transfers assist     Chair/bed transfer assist level: Contact Guard/Touching assist     Locomotion Ambulation   Ambulation assist      Assist level: Minimal Assistance - Patient > 75% Assistive device: Walker-rolling Max distance: 100 ft   Walk 10 feet activity   Assist     Assist level: Contact Guard/Touching assist Assistive device: Walker-rolling   Walk 50 feet activity   Assist    Assist level: Contact Guard/Touching assist Assistive device: Walker-rolling    Walk 150 feet activity   Assist Walk 150 feet activity did not occur: Safety/medical concerns (Standing rest break)  Walk 10 feet on uneven surface  activity   Assist Walk 10 feet on uneven surfaces activity did not occur: Safety/medical concerns         Wheelchair     Assist Will patient use wheelchair at discharge?: No             Wheelchair 50 feet with 2 turns activity    Assist            Wheelchair 150 feet activity     Assist            Medical Problem List and Plan: 1.  Decrease in endurance, fatigue, unsteady gait, difficulty following commands, intermittent confusion secondary to encephalopathy.  Continue CIR 2.  Antithrombotics: -DVT/anticoagulation:  Pharmaceutical: Continue Lovenox             -antiplatelet therapy: ASA 3. Chronic LBP/Pain Management: N/A             --gets ESI by Dr. Nelva Bush             Monitor  with increased exertion 4. Mood: LCSW to follow for evaluation and support.              -antipsychotic agents: N/A 5. Neuropsych: This patient is not fully capable of making decisions on her own behalf. 6. Skin/Wound Care: Routine pressure relief measures. 7. Fluids/Electrolytes/Nutrition: Strict I/Os. Low salt diet. 8. Fluid overload: Off lasix             --had stopped HCTZ due to frequency.               Filed Weights   10/29/20 0300 10/30/20 0435 10/31/20 0500  Weight: 99.4 kg 99.5 kg 93.3 kg    ?Reliability on 3/21 9. Hypercarbic respiratory failure/OSA?/COPD?: H/o pulmonary nodules with cavitation due to PNA in 2007?             stopped using inhalers due to cough. Not on MDI at home.    Encourage pulmonary hygiene.  Encourage CPAP as able. Educating patient 10. Hyponatremia: Resolved  Likely due to diuresis. 11. Morbid obesity (BMI 40.25): Encourage weight loss 12. Mild AKI: Resolved 13.  Hypertension  Lisinopril 2.5 started on 3/21 14.  Hyperglycemia  Monitor his increase mobility  LOS: 5 days A FACE TO FACE EVALUATION WAS PERFORMED  Ankit Lorie Phenix 10/31/2020, 10:45 AM

## 2020-10-31 NOTE — Progress Notes (Signed)
Occupational Therapy Session Note  Patient Details  Name: [name] MRN: [id number] Date of Birth: 05-05-1933  Today's Date: 10/31/2020 OT Individual Time: 6282-4175 OT Individual Time Calculation (min): 74 min    Short Term Goals: Week 1:  OT Short Term Goal 1 (Week 1): STGS = LTGs  Skilled Therapeutic Interventions/Progress Updates:    Pt received in recliner ready for therapy.  We discussed her challenges with self cleansing. On pt's cell phone using google search, showed pt several options of toilet aids. Our rehab only has the tongs, and with the tongs demonstrated to pt how to use with TP, wet wipes, wash cloths.  Pt then used rollator to move from recliner into bathroom with S. Supervision with clothing management over hips.  She practiced using the tongs to self cleanse. She did not need to void but was willing to practice. She said she was reaching but due to the extra flesh on her buttocks had difficulty getting between her buttocks.  Cued her to use her L hand to move the flesh of her L buttock out of the way while using the R hand with the tongs. This worked fairly well.  Pt stated that the nursing staff often does not want her to let her let go over the grab bar with her L hand, but pt is safe to do so. Made a sign for nursing staff and placed behind the toilet.    Pt ambulated back to recliner. Spent a lot of time talking about healthy lifestyle and the cycle of back pain, shortness of breath and eating salty processed microwave meals before admission.  Pt states her chronic back pain has completely gone away since admission.  Pt practiced working on AROM of torso twists to increase ROM that may aid her with self cleansing. Discussed various option of low sodium foods and how to integrate more vegetables/fruit into her diet.  Pt has 2 counter top air fryers and a counter top convection oven.  Pt can use these instead of her regular oven to prepare lean meats and roasted  vegetables.  Discussed recommendation of no driving for several months until cleared by physician.  If a friend is driving her, she can take her rollator vs using motorized cart to enable her to be more active.   Pt requested a hand out of healty cooking/eating tips. Will provide one for pt later this week.    Pt resting in recliner with alarm on and all needs met.   Therapy Documentation Precautions:  Precautions Precautions: Fall Precaution Comments: Monitor HR and O2 - only on oxygen since admission Restrictions Weight Bearing Restrictions: No Pain: Pain Assessment Pain Scale: 0-10 Pain Score: 0-No pain    Therapy/Group: Individual Therapy  Jedaiah Rathbun 10/31/2020, 10:41 AM

## 2020-11-01 DIAGNOSIS — R7303 Prediabetes: Secondary | ICD-10-CM

## 2020-11-01 LAB — VITAMIN B1: Vitamin B1 (Thiamine): 161.9 nmol/L (ref 66.5–200.0)

## 2020-11-01 MED ORDER — EXERCISE FOR HEART AND HEALTH BOOK
Freq: Once | Status: AC
  Filled 2020-11-01: qty 1

## 2020-11-01 NOTE — Progress Notes (Signed)
Patient ID: [name], female   DOB: [birth year], 87 y.o.   MRN: [id number]  Met with pt to discuss her concerns, she would like OT before PT session due to does not like doing therapy in her night gown. She would like a night stand due to not having drawers in her room. Will ask if can bring one upstairs since not one on the [facility] floor. Pt wanted to speak with therapy supervisor regarding missed therapy sessions. Suezanne Jacquet is aware and will speak with her. Pt wants to make sure she is ready to go home at discharge. Therapy had discussed yesterday possibility Friday discharge due to doing so well. Will discuss in team conference tomorrow.

## 2020-11-01 NOTE — Progress Notes (Signed)
Patient up in recliner upon initial rounds, alert and cooperative, denies pain but states she has had a busy day, Rambling concerning not having adequate amount of furniture to place her personal items on and has spoken to several people related to this issue and others concerns Reassurance provided and items rearranged in room per patient request. Assisted to BR with her Rolator wheelchair tolerated well some unsteadiness noted., Monitor and assisted chair alarm on while up in recliner.   0500 Patient has been awake the majority of the shift, stating she slept several hours and this her normal pattern for sleeping.

## 2020-11-01 NOTE — Plan of Care (Signed)
Problem: RH Balance Goal: LTG Patient will maintain dynamic standing with ADLs (OT) Description: LTG:  Patient will maintain dynamic standing balance with assist during activities of daily living (OT)  Flowsheets (Taken 11/01/2020 1320) LTG: Pt will maintain dynamic standing balance during ADLs with: (LTG downgraded due to pt's STM deficits.) Supervision/Verbal cueing Note: LTG downgraded due to pt's STM deficits.   Problem: Sit to Stand Goal: LTG:  Patient will perform sit to stand in prep for activites of daily living with assistance level (OT) Description: LTG:  Patient will perform sit to stand in prep for activites of daily living with assistance level (OT) Flowsheets (Taken 11/01/2020 1320) LTG: PT will perform sit to stand in prep for activites of daily living with assistance level: (LTG downgraded due to pt's STM deficits.) Supervision/Verbal cueing Note: LTG downgraded due to pt's STM deficits.   Problem: RH Bathing Goal: LTG Patient will bathe all body parts with assist levels (OT) Description: LTG: Patient will bathe all body parts with assist levels (OT) Flowsheets (Taken 11/01/2020 1320) LTG: Pt will perform bathing with assistance level/cueing: (LTG downgraded due to pt's STM deficits.) Supervision/Verbal cueing Note: LTG downgraded due to pt's STM deficits.   Problem: RH Dressing Goal: LTG Patient will perform upper body dressing (OT) Description: LTG Patient will perform upper body dressing with assist, with/without cues (OT). Flowsheets (Taken 11/01/2020 1320) LTG: Pt will perform upper body dressing with assistance level of: (LTG downgraded due to pt's STM deficits.) Set up assist Note: LTG downgraded due to pt's STM deficits. Goal: LTG Patient will perform lower body dressing w/assist (OT) Description: LTG: Patient will perform lower body dressing with assist, with/without cues in positioning using equipment (OT) Flowsheets (Taken 11/01/2020 1320) LTG: Pt will perform  lower body dressing with assistance level of: (LTG downgraded due to pt's STM deficits.) Supervision/Verbal cueing Note: LTG downgraded due to pt's STM deficits.   Problem: RH Toileting Goal: LTG Patient will perform toileting task (3/3 steps) with assistance level (OT) Description: LTG: Patient will perform toileting task (3/3 steps) with assistance level (OT)  Flowsheets (Taken 11/01/2020 1320) LTG: Pt will perform toileting task (3/3 steps) with assistance level: (LTG downgraded due to pt's STM deficits.) Supervision/Verbal cueing Note: LTG downgraded due to pt's STM deficits.   Problem: RH Simple Meal Prep Goal: LTG Patient will perform simple meal prep w/assist (OT) Description: LTG: Patient will perform simple meal prep with assistance, with/without cues (OT). Flowsheets (Taken 11/01/2020 1320) LTG: Pt will perform simple meal prep with assistance level of: (LTG downgraded due to pt's STM deficits.) Minimal Assistance - Patient > 75% Note: LTG downgraded due to pt's STM deficits.   Problem: RH Light Housekeeping Goal: LTG Patient will perform light housekeeping w/assist (OT) Description: LTG: Patient will perform light housekeeping with assistance, with/without cues (OT). Flowsheets (Taken 11/01/2020 1320) LTG: Pt will perform light housekeeping with assistance level of: (LTG downgraded due to pt's STM deficits.) Minimal Assistance - Patient > 75% Note: LTG downgraded due to pt's STM deficits.   Problem: RH Toilet Transfers Goal: LTG Patient will perform toilet transfers w/assist (OT) Description: LTG: Patient will perform toilet transfers with assist, with/without cues using equipment (OT) Flowsheets (Taken 11/01/2020 1320) LTG: Pt will perform toilet transfers with assistance level of: (LTG downgraded due to pt's STM deficits.) Supervision/Verbal cueing Note: LTG downgraded due to pt's STM deficits.   Problem: RH Tub/Shower Transfers Goal: LTG Patient will perform tub/shower  transfers w/assist (OT) Description: LTG: Patient will perform tub/shower transfers with  assist, with/without cues using equipment (OT) Flowsheets (Taken 11/01/2020 1320) LTG: Pt will perform tub/shower stall transfers with assistance level of: (LTG downgraded due to pt's STM deficits.) Supervision/Verbal cueing Note: LTG downgraded due to pt's STM deficits.

## 2020-11-01 NOTE — Progress Notes (Signed)
Occupational Therapy Session Note  Patient Details  Name: [name] MRN: [id number] Date of Birth: 1932/09/01  Today's Date: 11/01/2020 OT Individual Time: 1140-1215 OT Individual Time Calculation (min): 35 min    Short Term Goals: Week 1:  OT Short Term Goal 1 (Week 1): STGS = LTGs  Skilled Therapeutic Interventions/Progress Updates:    Pt received in recliner finishing a discussion with the RN.  Pt had difficulty recalling that she had already told me about her kitchen appliances as that followed RN's discussion with her on healthy eating.  For first half of session, focused on a simple home exercise program pt can do from her chair.  Turned music on that pt enjoys and led her through a seated dance class with alternating AROM of arms, legs and torso.  Pt needed a short 1 minute rest after every 3-4 min of exercises.pt able to continue for a total of 15 min.  She then needed to toilet. Using rollater, she got up from recliner to walk to bathroom, sat and stood from low regular toilet using grab bar and back to recliner all with S. The cleansing of her bottom is still a struggle. Pt reported she did use the toilet tongs last night but ended up making a "mess".  She does not think she can use them at all.  Encouraged pt to keep practicing. Pt c/o TP and washcloths are both too rough and her bottom is sore. Pt will benefit from wet wipes. Asked her to see if her family could bring them in.  During the sessions, pt often becomes quite tangential and demonstrated difficulty with day to day recall.  Her LTGs were initially set at mod I, but pt will need S at discharge.   Pt resting in recliner to prepare for lunch with alarm on and all needs met.   Therapy Documentation Precautions:  Precautions Precautions: Fall Precaution Comments: Monitor HR and O2 - only on oxygen since admission Restrictions Weight Bearing Restrictions: No  Pain: Pain Assessment Pain Scale: 0-10 Pain Score: 0-No  pain ADL: ADL Eating: Set up Grooming: Setup Upper Body Bathing: Supervision/safety Where Assessed-Upper Body Bathing: Shower Lower Body Bathing: Supervision/safety Where Assessed-Lower Body Bathing: Shower Upper Body Dressing: Supervision/safety Where Assessed-Upper Body Dressing: Chair Lower Body Dressing: Minimal assistance Where Assessed-Lower Body Dressing: Chair Toileting: Minimal assistance Where Assessed-Toileting: Glass blower/designer: Close supervision Toilet Transfer Method: Counselling psychologist: Energy manager: Close supervision Social research officer, government Method: Heritage manager: Grab bars   Therapy/Group: Individual Therapy  [place] 11/01/2020, 1:11 PM

## 2020-11-01 NOTE — Progress Notes (Signed)
PROGRESS NOTE   Subjective/Complaints: Patient seen sitting up in her chair this morning.  She states she slept well overnight.  She notes that she had her first good bowel movement this morning.  ROS: Denies CP, SOB, N/V/D.  Objective:   No results found. Recent Labs    10/31/20 0711  WBC 6.1  HGB 12.5  HCT 41.6  PLT 164   Recent Labs    10/31/20 0711  NA 136  K 4.3  CL 93*  CO2 38*  GLUCOSE 141*  BUN 14  CREATININE 0.86  CALCIUM 8.9    Intake/Output Summary (Last 24 hours) at 11/01/2020 1228 Last data filed at 11/01/2020 0715 Gross per 24 hour  Intake 956 ml  Output --  Net 956 ml        Physical Exam: Vital Signs Blood pressure (!) 124/55, pulse 93, temperature 97.9 F (36.6 C), temperature source Oral, resp. rate 16, height 4' 11.75" (1.518 m), weight 93.3 kg, SpO2 95 %. Constitutional: No distress . Vital signs reviewed. HENT: Normocephalic.  Atraumatic. Eyes: EOMI. No discharge. Cardiovascular: No JVD.  RRR. Respiratory: Normal effort.  No stridor.  Bilateral clear to auscultation. GI: Non-distended.  BS +. Skin: Warm and dry.  Intact. Psych: Normal mood.  Tangential at times. Musc: Bilaterally LE edema, stable.  No tenderness in extremities. Neuro: Alert and oriented x3. Motor: 4+/5 throughout, unchanged  Assessment/Plan: 1. Functional deficits which require 3+ hours per day of interdisciplinary therapy in a comprehensive inpatient rehab setting.  Physiatrist is providing close team supervision and 24 hour management of active medical problems listed below.  Physiatrist and rehab team continue to assess barriers to discharge/monitor patient progress toward functional and medical goals  Care Tool:  Bathing    Body parts bathed by patient: Right arm,Left arm,Chest,Abdomen,Right upper leg,Left upper leg,Face   Body parts bathed by helper: Buttocks,Front perineal area     Bathing assist  Assist Level: Moderate Assistance - Patient 50 - 74%     Upper Body Dressing/Undressing Upper body dressing   What is the patient wearing?: Bra,Pull over shirt    Upper body assist Assist Level: Moderate Assistance - Patient 50 - 74%    Lower Body Dressing/Undressing Lower body dressing      What is the patient wearing?: Underwear/pull up,Pants     Lower body assist Assist for lower body dressing: Maximal Assistance - Patient 25 - 49%     Toileting Toileting    Toileting assist Assist for toileting: Supervision/Verbal cueing     Transfers Chair/bed transfer  Transfers assist     Chair/bed transfer assist level: Supervision/Verbal cueing     Locomotion Ambulation   Ambulation assist      Assist level: Minimal Assistance - Patient > 75% Assistive device: Walker-rolling Max distance: 100 ft   Walk 10 feet activity   Assist     Assist level: Contact Guard/Touching assist Assistive device: Walker-rolling   Walk 50 feet activity   Assist    Assist level: Contact Guard/Touching assist Assistive device: Walker-rolling    Walk 150 feet activity   Assist Walk 150 feet activity did not occur: Safety/medical concerns (Standing rest break)  Walk 10 feet on uneven surface  activity   Assist Walk 10 feet on uneven surfaces activity did not occur: Safety/medical concerns         Wheelchair     Assist Will patient use wheelchair at discharge?: No             Wheelchair 50 feet with 2 turns activity    Assist            Wheelchair 150 feet activity     Assist            Medical Problem List and Plan: 1.  Decrease in endurance, fatigue, unsteady gait, difficulty following commands, intermittent confusion secondary to encephalopathy.  Continue CIR 2.  Antithrombotics: -DVT/anticoagulation:  Pharmaceutical: Continue Lovenox             -antiplatelet therapy: ASA 3. Chronic LBP/Pain Management: N/A              --gets ESI by Dr. Nelva Bush             Monitor with increased exertion 4. Mood: LCSW to follow for evaluation and support.              -antipsychotic agents: N/A 5. Neuropsych: This patient is not fully capable of making decisions on her own behalf. 6. Skin/Wound Care: Routine pressure relief measures. 7. Fluids/Electrolytes/Nutrition: Strict I/Os. Low salt diet. 8. Fluid overload: Off lasix             --had stopped HCTZ due to frequency.               Filed Weights   10/29/20 0300 10/30/20 0435 10/31/20 0500  Weight: 99.4 kg 99.5 kg 93.3 kg    ?Reliability on 3/21, no repeat weights 9.  Hypercarbic respiratory failure/OSA?/COPD?: H/o pulmonary nodules with cavitation due to PNA in 2007?             stopped using inhalers due to cough. Not on MDI at home.    Encourage pulmonary hygiene.  Encourage CPAP as able. Educating patient 10. Hyponatremia: Resolved  Likely due to diuresis. 11. Morbid obesity (BMI 40.25): Encourage weight loss 12. Mild AKI: Resolved 13.  Hypertension  Lisinopril 2.5 started on 3/21  Controlled on 3/22 14.  Prediabetes  Monitor his increase mobility, will plan to change to carb modified diet  LOS: 6 days A FACE TO FACE EVALUATION WAS PERFORMED  Elani Delph Lorie Phenix 11/01/2020, 12:28 PM

## 2020-11-01 NOTE — Progress Notes (Signed)
Physical Therapy Session Note  Patient Details  Name: [name] MRN: [id number] Date of Birth: 1933-05-29  Today's Date: 11/01/2020 PT Individual Time: 0915-0958 + 1300-1410 PT Individual Time Calculation (min): 43 min  + 70 min  Short Term Goals: Week 1:  PT Short Term Goal 1 (Week 1): STG = LTG due to ELOS  Skilled Therapeutic Interventions/Progress Updates:     1st session: Pt greeted seated in recliner with hospital gown on, connected to 1L wall O2. Upon entrance into room, pt immediately verbalizing frustration with therapy schedule - reports she should be fully dressed with OT prior to going to the therapy gym with PT. She is quite upset about this and it was somewhat difficult to reason with her. Female NT called into room to assist with dressing needs as pt requests females only for pericare and dressing. Pt continues to defer NT assist for dressing, stating this is "OT's job." Ultimately, with effort, she was agreeable to NT assisting. Pt then reporting need to toilet, ambulated with supervision and rollator to bathroom - able to manage her lower body dressing with CGA in standing - again she requested female assist after completed for pericare. NT made aware who arrived to assist with this. With what time was remaining, worked on Personnel officer. She was saturating 85% on RA, HR 108 at rest. With 0.5L via portable tank, she saturated at 93%, HR 99. Gait x254ft (broken up with several brief standing rest breaks) with supervision and rollator. Cues needed for upright posture, pursed lip breathing, pacing her activity. No LOB or knee buckling present and spO2 >92% during gait on 0.5L portable tank. She ended session seated in recliner with needs in reach, connected to wall O2 at 0.5L.   2nd session: Pt greeted seated in recliner, on 0.5L wall O2, awake and agreeable to therapy - no reports of pain. She continues to express frustration regarding general policies and circles back to our prior  conversation regarding therapy schedule. She also expressed frustration as their is no night stand in her room. Concerns relayed to her RN who reports that she made D.O.N. aware. Pt also expressing frustration that she missed Friday's therapy and had to make this up on Sunday - somewhat difficult to reason with explanations and had trouble redirecting during our session. Stand<>pivot transfer with supervision and rollator to w/c. Connected to 0.5L portable tank and transported downstairs to main rehab gym for time management. Gait training ~369ft with supervision and rollator - frequent brief standing rest breaks due to fatigue. Practiced transitioning from standing to/from sitting on her rollator and making sure brakes are locked during transitions - improved during session. SpO2 >90% during gait and session on 0.5L. Performed stair training, up/down 2x8 steps (seated rest break) with CGA and bilateral hand rails, with self selected reciprocal pattern. Ambulated from her room to ortho gym with sueprvision and rollator but she then reported need to use the toilet. Supervision and rollator to transfer to toilet in therapy bathroom. Requesting female for pericare, NT notified who arrived to assist as needed. Ambulated back to main rehab gym, ~131ft, with sueprvision and rollator and then returned back up stairs with totalA in w/c for time purposes. Ambulated within her room with supervision and rollator - ended session in recliner with needs in reach, chair alarm on, reconnected to wall O2 0.5L. NT arriving for routine vitals.  Therapy Documentation Precautions:  Precautions Precautions: Fall Precaution Comments: Monitor HR and O2 - only on oxygen since  admission Restrictions Weight Bearing Restrictions: No  Therapy/Group: Individual Therapy  Christian P Manhard PT 11/01/2020, 7:39 AM

## 2020-11-01 NOTE — Progress Notes (Signed)
Patient ID: [name], female   DOB: April 02, 1933, 87 y.o.   MRN: [id number] Met with the patient ( and her sister [name] via phone) to review role of the nurse CM and address secondary stroke risk management and educational needs. Reviewed management of HTN, HLD (LDL 105) , prediabetes (A1C 5.9) and fluid overload symptoms with COPD. Discussed low fat, low carb, low salt diet. Also reviewed collaboration with the SW to facilitate preparation for discharge. Patient given written materials to review that covered information spoken about including an exercise handbook. OT Gregary Signs) to review more specific exercises appropriate for the patient not covered in the handbook. Continue to follow along to discharge to address educational needs and facilitate comfort with discharge. Margarito Liner

## 2020-11-01 NOTE — Plan of Care (Signed)
  Problem: RH Balance Goal: LTG Patient will maintain dynamic standing balance (PT) Description: LTG:  Patient will maintain dynamic standing balance with assistance during mobility activities (PT) Flowsheets (Taken 11/01/2020 1256) LTG: Pt will maintain dynamic standing balance during mobility activities with:: Supervision/Verbal cueing   Problem: RH Bed to Chair Transfers Goal: LTG Patient will perform bed/chair transfers w/assist (PT) Description: LTG: Patient will perform bed to chair transfers with assistance (PT). Flowsheets (Taken 11/01/2020 1256) LTG: Pt will perform Bed to Chair Transfers with assistance level: Supervision/Verbal cueing   Problem: RH Ambulation Goal: LTG Patient will ambulate in controlled environment (PT) Description: LTG: Patient will ambulate in a controlled environment, # of feet with assistance (PT). Flowsheets (Taken 11/01/2020 1256) LTG: Pt will ambulate in controlled environ  assist needed:: Supervision/Verbal cueing Goal: LTG Patient will ambulate in home environment (PT) Description: LTG: Patient will ambulate in home environment, # of feet with assistance (PT). Flowsheets (Taken 11/01/2020 1256) LTG: Pt will ambulate in home environ  assist needed:: Supervision/Verbal cueing   Problem: RH Stairs Goal: LTG Patient will ambulate up and down stairs w/assist (PT) Description: LTG: Patient will ambulate up and down # of stairs with assistance (PT) Flowsheets (Taken 11/01/2020 1256) LTG: Pt will ambulate up/down stairs assist needed:: Contact Guard/Touching assist LTG: Pt will  ambulate up and down number of stairs: 12

## 2020-11-01 NOTE — Progress Notes (Signed)
Speech Language Pathology Daily Session Note  Patient Details  Name: [name] MRN: [id number] Date of Birth: 05/10/1933  Today's Date: 11/01/2020 SLP Individual Time: 7939-6886 SLP Individual Time Calculation (min): 40 min  Short Term Goals: Week 1: SLP Short Term Goal 1 (Week 1): Patient will perform complex level problem solving tasks in areas of financial and medication management with supervision to mod I SLP Short Term Goal 2 (Week 1): Patient will demonstrate awareness to and self correct/attempt to self correct during problem solving tasks with mod I. SLP Short Term Goal 3 (Week 1): Patient will demonatrate adequate anticipatory awareness during discussion on anticipated needs upon discharge.  Skilled Therapeutic Interventions: Pt was seen for skilled ST targeting cognitive goals. SLP facilitated session with a complex deductive reasoning puzzle, during which pt required overall Supervision A for problem solving and organization. Her performance today appears much improved since a similar task was targeted in ST over the weekend. She also participated in a verbal time management/planning task, during which she required Supervision A verbal cues and use of pen and paper (which she initiated herself) for problem solving. Pt reports she is feeling more "put together" cognitively, but still slightly off from baseline, as she believes these tasks would not have taken the degree of mental effort and time that it took her to complete them today. SLP provided education regarding mild cognitive deficits in setting of acute CVA. Pt left sitting in recliner with needs within reach. Continue per current plan of care.          Pain Pain Assessment Pain Scale: 0-10 Pain Score: 0-No pain  Therapy/Group: Individual Therapy  Arbutus Leas 11/01/2020, 7:25 AM

## 2020-11-01 NOTE — Progress Notes (Signed)
Late entry:  Nurse cknowledged flutter valve order and device was given to patient, nurse explained to patient how to use and informed her that RT will be able to go more in-depth with her on use.

## 2020-11-01 NOTE — Progress Notes (Signed)
Pt refusing cpap for the night. ?

## 2020-11-02 DIAGNOSIS — I10 Essential (primary) hypertension: Secondary | ICD-10-CM

## 2020-11-02 DIAGNOSIS — G479 Sleep disorder, unspecified: Secondary | ICD-10-CM

## 2020-11-02 NOTE — Discharge Summary (Signed)
Physical Therapy Discharge Summary  Patient Details  Name: [name] MRN: [id number] Date of Birth: 10-Sep-1932  Patient has met 8 of 8 long term goals due to improved activity tolerance, improved balance, increased strength and improved attention.  Patient to discharge at an ambulatory level Supervision.   Patient's care partner is independent to provide the necessary physical and cognitive assistance at discharge.  Reasons goals not met: NA  Recommendation:  Patient will benefit from ongoing skilled PT services in home health setting to continue to advance safe functional mobility, address ongoing impairments in balance ,endurance, gait, and strength in order to minimize fall risk.  Equipment: Rollator  Reasons for discharge: treatment goals met and discharge from hospital  Patient/family agrees with progress made and goals achieved: Yes  PT Discharge Precautions/Restrictions Precautions Precautions: Fall Precaution Comments: Monitor O2 - only on oxygen since admission Restrictions Weight Bearing Restrictions: No Vision/Perception  Perception Perception: Within Functional Limits Praxis Praxis: Intact  Cognition Overall Cognitive Status: Impaired/Different from baseline Arousal/Alertness: Awake/alert Orientation Level: Oriented X4 Safety/Judgment: Impaired Sensation Sensation Light Touch: Appears Intact Hot/Cold: Appears Intact Proprioception: Appears Intact Stereognosis: Appears Intact Motor  Motor Motor: Within Functional Limits Motor - Discharge Observations: generalized motor weakness - improved since date of evaluation  Mobility Bed Mobility Bed Mobility: Rolling Right;Supine to Sit;Rolling Left;Sit to Supine Transfers Transfers: Sit to Stand;Stand to Sit;Stand Pivot Transfers Sit to Stand: Independent with assistive device Stand to Sit: Independent with assistive device Stand Pivot Transfers: Supervision/Verbal cueing Stand Pivot Transfer Details:  Verbal cues for gait pattern;Verbal cues for precautions/safety;Verbal cues for sequencing Transfer (Assistive device): 4-wheeled walker Locomotion  Gait Ambulation: Yes Gait Assistance: Supervision/Verbal cueing Gait Distance (Feet): 280 Feet Assistive device: 4-wheeled walker Gait Assistance Details: Verbal cues for gait pattern;Verbal cues for precautions/safety;Verbal cues for sequencing Gait Gait: Yes Gait Pattern: Impaired Gait Pattern: Step-through pattern;Decreased step length - right;Decreased step length - left;Trunk flexed;Wide base of support Stairs / Additional Locomotion Stairs: Yes Stairs Assistance: Contact Guard/Touching assist Stair Management Technique: Two rails Number of Stairs: 8 Height of Stairs: 6 Wheelchair Mobility Wheelchair Mobility: No  Trunk/Postural Assessment  Cervical Assessment Cervical Assessment: Within Functional Limits Thoracic Assessment Thoracic Assessment: Within Functional Limits Lumbar Assessment Lumbar Assessment: Within Functional Limits Postural Control Postural Control: Within Functional Limits  Balance Balance Balance Assessed: Yes Dynamic Sitting Balance Dynamic Sitting - Level of Assistance: 7: Independent Static Standing Balance Static Standing - Level of Assistance: 6: Modified independent (Device/Increase time) Dynamic Standing Balance Dynamic Standing - Level of Assistance: 5: Stand by assistance Extremity Assessment  RLE Assessment RLE Assessment: Within Functional Limits LLE Assessment LLE Assessment: Within Functional Limits    Alger Simons, PT, DPT Tereasa Coop, PT, DPT 11/04/2020, 4:44 PM

## 2020-11-02 NOTE — Plan of Care (Signed)
  Problem: RH Attention Goal: LTG Patient will demonstrate this level of attention during functional activites (SLP) Description: LTG:  Patient will will demonstrate this level of attention during functional activites (SLP) Flowsheets (Taken 11/02/2020 1109) Patient will demonstrate during cognitive/linguistic activities the attention type of:  Alternating  Selective Patient will demonstrate this level of attention during cognitive/linguistic activities in:  Controlled  Home LTG: Patient will demonstrate this level of attention during cognitive/linguistic activities with assistance of (SLP): Supervision Number of minutes patient will demonstrate attention during cognitive/linguistic activities: 30 Note: Downgraded due to less than anticipated progress   Problem: RH Awareness Goal: LTG: Patient will demonstrate awareness during functional activites type of (SLP) Description: LTG: Patient will demonstrate awareness during functional activites type of (SLP) Flowsheets (Taken 11/02/2020 1109) Patient will demonstrate during cognitive/linguistic activities awareness type of: Anticipatory LTG: Patient will demonstrate awareness during cognitive/linguistic activities with assistance of (SLP): Supervision Note: Downgraded due to less than anticipated progress

## 2020-11-02 NOTE — Progress Notes (Signed)
Physical Therapy Session Note  Patient Details  Name: [name] MRN: [id number] Date of Birth: [birth year]  Today's Date: 11/02/2020 PT Individual Time: 1130-1158 + 1300-1353 PT Individual Time Calculation (min): 28 min  + 53 min  Short Term Goals: Week 1:  PT Short Term Goal 1 (Week 1): STG = LTG due to ELOS  Skilled Therapeutic Interventions/Progress Updates:     1st session: Pt greeted seated in recliner, awake and agreeable to therapy. No reports of pain. She has questions regarding the team conference - reports she waiting on everyone to arrive to begin the conference - educated her on staff only for team conference as pt unaware. Pt requesting to call her family to let them know as they were waiting to be called for conference. While calling family, collaborated with them on pt's upcoming DC plan. Assisted patient with DC planning and ensuring pt has 24/7 supervision at [facility] - pt's friend, Phylis, will be providing this. Pt ended session as found, seated in recliner with needs in reach.   2nd session: Pt received sitting in recliner, agreeable to therapy. 92% spO2 on RA. Stand<>pivot transfer with supervision and rollator to w/c. Transported downstairs to main rehab gym for time management. Gait x235ft with supervision and rollator (a few brief standing rest breaks due to fatigue) - cues for paced activity and continued pursed lip breathing. SpO2 90% on RA after gait. Repeated sit<>stands with 1lb dowel rod with task overlay for chest press after each stand, completed with supervision and cues needed for technique. Completed seated ball toss with unweighted ball and her using 4# dowel rod to hit ball back, focusing on BUE coordination and general strengthening. Ambulated to ADL apartment room with supervision and rollator, performed furniture transfers with supervision and rollator - she's demonstrating appropriate safety awareness with brakes by locking/unlcking during transfers. Ambulated  to ortho gym with supervision and rollator, completed Nustep for x2 minutes with BUE & BLE - working on cardiorespiratory endurance and global strengthening - SpO2 to 87% but returns to baseline with seated rest. Ambulated back to main rehab gym with supervision and rollator to her w/c, returned upstairs to her room and she performed stand<>pivot transfer with supervision and no AD to her recliner. Pt remained seated in recliner with chair alarm on and needs within reach at end of session.  Therapy Documentation Precautions:  Precautions Precautions: Fall Precaution Comments: Monitor HR and O2 - only on oxygen since admission Restrictions Weight Bearing Restrictions: No General:    Therapy/Group: Individual Therapy  Christian P Manhard PT 11/02/2020, 7:48 AM

## 2020-11-02 NOTE — Patient Care Conference (Signed)
Inpatient RehabilitationTeam Conference and Plan of Care Update Date: 11/02/2020   Time: 11:06 AM    Patient Name: [name]      Medical Record Number: [id number]  Date of Birth: 01-30-1933 Sex: Female         Room/Bed: 5C06C/5C06C-01 Payor Info: Payor: MEDICARE / Plan: MEDICARE PART A AND B / Product Type: *No Product type* /    Admit Date/Time:  10/26/2020  5:11 PM  Primary Diagnosis:  Encephalopathy  Hospital Problems: Principal Problem:   Encephalopathy Active Problems:   Stroke (cerebrum) (Green)   Hyperglycemia   Hypercapnia   Prediabetes   Sleep disturbance   Benign essential HTN    Expected Discharge Date: Expected Discharge Date: 11/05/20  Team Members Present: Physician leading conference: Dr. Delice Lesch Care Coodinator Present: Dorien Chihuahua, RN, BSN, CRRN;Becky Dupree, LCSW Nurse Present: Dorien Chihuahua, RN PT Present: Ginnie Smart, PT OT Present: Meriel Pica, OT SLP Present: Jettie Booze, CF-SLP PPS Coordinator present : Ileana Ladd, PT     Current Status/Progress Goal Weekly Team Focus  Bowel/Bladder   Continent of Bladder & Bowel, LBM 11/01/2020  Maintain contnence of Bladder/Bowel  Assess toileting needs QS/PRN   Swallow/Nutrition/ Hydration             ADL's   min A toileting and LB self care, supervision with ADL transfers to toilet, min A to shower  supervision  ADL training with AE, pt/family ed, activity tolerance   Mobility   CGA for all mobility. on 0.5L for oxygen support. gait >275ft with rollator..  Supervision  DC planning, weaning O2, general strengthening and improving activity tolerance.   Communication             Safety/Cognition/ Behavioral Observations  Supervision  Mod I-Supervision  complex problem solving, alternating attention, anticipatory awareness   Pain   Denies Pain  < 2 on pain scale 0/10  QS/PRN assessment of pain   Skin   Skin intact, warm and dry  < =2 on pain scale, maintain  skin integrity  Assess  QS/PRN     Discharge Planning:  Home with friend coming who is a retired Therapist, sports for a short time. Eventualy will be home alone with intermittent assist   Team Discussion: Patient medically is doing well. Can be tangential with memory deficits. Progress limited by poor endurance and weakness. Anxiety limits problem solving and rationalization at times.  Patient on target to meet rehab goals: yes, currently supervision for bathing and dressing and CGA for mobility. Weaning off O2 and able to maintain O2 sat > 90% and HR < 110. Supervision goals set for discharge.  *See Care Plan and progress notes for long and short-term goals.   Revisions to Treatment Plan:   Teaching Needs: Transfers,toileting, medications, secondary stroke risk management, etc. Rationale for CPAP use; doesn't sleep well at HS   Current Barriers to Discharge: Decreased caregiver support and Home enviroment access/layout  Possible Resolutions to Barriers: Education with friend     Medical Summary Current Status: Decrease in endurance, fatigue, unsteady gait, difficulty following commands, intermittent confusion secondary to encephalopathy  Barriers to Discharge: Medical stability;Medication compliance;Other (comments)  Barriers to Discharge Comments: Higher level cognitive disfunction Possible Resolutions to Barriers/Weekly Focus: Therapies, optimize BP meds, compliance with CPAP   Continued Need for Acute Rehabilitation Level of Care: The patient requires daily medical management by a physician with specialized training in physical medicine and rehabilitation for the following reasons: Direction of a multidisciplinary physical rehabilitation program  to maximize functional independence : Yes Medical management of patient stability for increased activity during participation in an intensive rehabilitation regime.: Yes Analysis of laboratory values and/or radiology reports with any subsequent need for medication  adjustment and/or medical intervention. : Yes   I attest that I was present, lead the team conference, and concur with the assessment and plan of the team.   Dorien Chihuahua B 11/02/2020, 3:07 PM

## 2020-11-02 NOTE — Progress Notes (Signed)
PROGRESS NOTE   Subjective/Complaints: Patient seen sitting up in her chair this AM.  She states she slept better overnight.  Discussed Melatonin, however, pt states she is weary of anything and would like to do research first.   ROS: Denies CP, SOB, N/V/D.  Objective:   No results found. Recent Labs    10/31/20 0711  WBC 6.1  HGB 12.5  HCT 41.6  PLT 164   Recent Labs    10/31/20 0711  NA 136  K 4.3  CL 93*  CO2 38*  GLUCOSE 141*  BUN 14  CREATININE 0.86  CALCIUM 8.9    Intake/Output Summary (Last 24 hours) at 11/02/2020 1106 Last data filed at 11/02/2020 0710 Gross per 24 hour  Intake 1576 ml  Output 120 ml  Net 1456 ml        Physical Exam: Vital Signs Blood pressure (!) 139/51, pulse 98, temperature 98.4 F (36.9 C), temperature source Oral, resp. rate 18, height 4' 11.75" (1.518 m), weight 95.2 kg, SpO2 96 %. Constitutional: No distress . Vital signs reviewed. HENT: Normocephalic.  Atraumatic. Eyes: EOMI. No discharge. Cardiovascular: No JVD.  RRR. Respiratory: Normal effort.  No stridor.  Bilateral clear to auscultation. GI: Non-distended.  BS +. Skin: Warm and dry.  Intact. Psych: Normal mood.  Normal behavior. Musc: Bilaterally LE edema, unchanged  No tenderness in extremities. Neuro: Alert and oriented x3. Motor: 4+/5 throughout, stable  Assessment/Plan: 1. Functional deficits which require 3+ hours per day of interdisciplinary therapy in a comprehensive inpatient rehab setting.  Physiatrist is providing close team supervision and 24 hour management of active medical problems listed below.  Physiatrist and rehab team continue to assess barriers to discharge/monitor patient progress toward functional and medical goals  Care Tool:  Bathing    Body parts bathed by patient: Right arm,Left arm,Chest,Abdomen,Right upper leg,Left upper leg,Face   Body parts bathed by helper: Buttocks,Front  perineal area     Bathing assist Assist Level: Moderate Assistance - Patient 50 - 74%     Upper Body Dressing/Undressing Upper body dressing   What is the patient wearing?: Bra,Pull over shirt    Upper body assist Assist Level: Moderate Assistance - Patient 50 - 74%    Lower Body Dressing/Undressing Lower body dressing      What is the patient wearing?: Underwear/pull up,Pants     Lower body assist Assist for lower body dressing: Maximal Assistance - Patient 25 - 49%     Toileting Toileting    Toileting assist Assist for toileting: Minimal Assistance - Patient > 75%     Transfers Chair/bed transfer  Transfers assist     Chair/bed transfer assist level: Supervision/Verbal cueing     Locomotion Ambulation   Ambulation assist      Assist level: Minimal Assistance - Patient > 75% Assistive device: Walker-rolling Max distance: 100 ft   Walk 10 feet activity   Assist     Assist level: Contact Guard/Touching assist Assistive device: Walker-rolling   Walk 50 feet activity   Assist    Assist level: Contact Guard/Touching assist Assistive device: Walker-rolling    Walk 150 feet activity   Assist Walk 150 feet activity  did not occur: Safety/medical concerns (Standing rest break)         Walk 10 feet on uneven surface  activity   Assist Walk 10 feet on uneven surfaces activity did not occur: Safety/medical concerns         Wheelchair     Assist Will patient use wheelchair at discharge?: No             Wheelchair 50 feet with 2 turns activity    Assist            Wheelchair 150 feet activity     Assist            Medical Problem List and Plan: 1.  Decrease in endurance, fatigue, unsteady gait, difficulty following commands, intermittent confusion secondary to encephalopathy.  Continue CIR  Team conference today to discuss current and goals and coordination of care, home and environmental barriers, and  discharge planning with nursing, case manager, and therapies. Please see conference note from today as well.  2.  Antithrombotics: -DVT/anticoagulation:  Pharmaceutical: Continue Lovenox             -antiplatelet therapy: ASA 3. Chronic LBP/Pain Management: N/A             --gets ESI by Dr. Nelva Bush             Monitor with increased exertion 4. Mood: LCSW to follow for evaluation and support.              -antipsychotic agents: N/A 5. Neuropsych: This patient is not fully capable of making decisions on her own behalf. 6. Skin/Wound Care: Routine pressure relief measures. 7. Fluids/Electrolytes/Nutrition: Strict I/Os. Low salt diet. 8. Fluid overload: Off lasix             --had stopped HCTZ due to frequency.               Filed Weights   10/30/20 0435 10/31/20 0500 11/02/20 0437  Weight: 99.5 kg 93.3 kg 95.2 kg    ?Reliability on 3/23 9.  Hypercarbic respiratory failure/OSA?/COPD?: H/o pulmonary nodules with cavitation due to PNA in 2007?             stopped using inhalers due to cough. Not on MDI at home.    Encourage pulmonary hygiene.  Encourage CPAP as able. Educating patient 10. Hyponatremia: Resolved  Likely due to diuresis. 11. Morbid obesity (BMI 40.25): Encourage weight loss 12. Mild AKI: Resolved 13.  Hypertension  Lisinopril 2.5 started on 3/21  Relatively controlled on 3/23 14.  Prediabetes  Diet change to carb modified diet  Monitor his increase mobility 15.  Sleep disturbance  Discussed melatonin with patient, ordered, however patient states that she would like to do her own research and determine if it is a medication she is going to take.  LOS: 7 days A FACE TO FACE EVALUATION WAS PERFORMED  [name] Lorie Phenix 11/02/2020, 11:06 AM

## 2020-11-02 NOTE — Progress Notes (Signed)
Patient ID: [name], female   DOB: 1932/12/02, 87 y.o.   MRN: [id number]  Met with pt and pt got sister on speaker phone to hear the team conference update of supervision level goals and target discharge date of 3/26. Both very pleased with how well she is doing and she is off her O2 now. Pt voiced her sister is arranging the retired Therapist, sports to be at home with her for a short time. Both aware of discharge Sat and time between 10-11 am. Agreeable to home health follow up and any equipment needed. Pt very happy with her night stand in room so she can place her flowers and things in the drawers. Continue to work on discharge needs for Sat.

## 2020-11-02 NOTE — Progress Notes (Signed)
Occupational Therapy Session Note  Patient Details  Name: IDOLINA MANTELL MRN: [id number] Date of Birth: 02-Nov-1932  Today's Date: 11/02/2020 OT Individual Time: 0930-1030 OT Individual Time Calculation (min): 60 min    Short Term Goals: Week 1:  OT Short Term Goal 1 (Week 1): STGS = LTGs  Skilled Therapeutic Interventions/Progress Updates:    Pt seen this session for ADL training on room air only (O2 sats 89-91%) to complete toileting, shower, dressing. Using AE, pt did extremely well completing all tasks at a supervision level except for only slight A with adjusting her bra.   Pt stated she felt really good today and was feeling more confident about going home.  Pt resting in recliner with all needs met.   Therapy Documentation Precautions:  Precautions Precautions: Fall Precaution Comments: Monitor O2 - only on oxygen since admission Restrictions Weight Bearing Restrictions: No  Pain: Pain Assessment Pain Score: 0-No pain ADL: ADL Eating: Set up Grooming: Setup Upper Body Bathing: Supervision/safety Where Assessed-Upper Body Bathing: Shower Lower Body Bathing: Supervision/safety Where Assessed-Lower Body Bathing: Shower Upper Body Dressing: Supervision/safety Where Assessed-Upper Body Dressing: Chair Lower Body Dressing: Supervision/safety Where Assessed-Lower Body Dressing: Chair Toileting: Supervision/safety Where Assessed-Toileting: Glass blower/designer: Close supervision Toilet Transfer Method: Counselling psychologist: Energy manager: Close supervision Social research officer, government Method: Heritage manager: Grab bars   Therapy/Group: Individual Therapy  [place] 11/02/2020, 1:19 PM

## 2020-11-02 NOTE — Progress Notes (Signed)
Speech Language Pathology Daily Session Note  Patient Details  Name: [name] MRN: [id number] Date of Birth: 1932-10-19  Today's Date: 11/02/2020 SLP Individual Time: 6283-1517 SLP Individual Time Calculation (min): 25 min  Short Term Goals: Week 1: SLP Short Term Goal 1 (Week 1): Patient will perform complex level problem solving tasks in areas of financial and medication management with supervision to mod I SLP Short Term Goal 2 (Week 1): Patient will demonstrate awareness to and self correct/attempt to self correct during problem solving tasks with mod I. SLP Short Term Goal 3 (Week 1): Patient will demonatrate adequate anticipatory awareness during discussion on anticipated needs upon discharge.  Skilled Therapeutic Interventions: Pt was seen for skilled ST targeting cognitive goals. During a complex medication management task, pt demonstrated ability to organize a BID pill box with overall Supervision A verbal cues for problem solving and attention to task. She expressed need to void during session, and SLP provided Supervision A verbal cues for safety awareness and Min A for selective attention. Pt left sitting in recliner with alarm set and needs within reach. Continue per current plan of care.          Pain Pain Assessment Pain Scale: 0-10 Pain Score: 0-No pain  Therapy/Group: Individual Therapy  Arbutus Leas 11/02/2020, 7:25 AM

## 2020-11-03 MED ORDER — MELATONIN 3 MG PO TABS: 1.5000 mg | ORAL_TABLET | Freq: Every day | ORAL | Status: DC

## 2020-11-03 MED ORDER — ACETAMINOPHEN 325 MG PO TABS: 325.0000 mg | ORAL_TABLET | ORAL | Status: DC | PRN

## 2020-11-03 MED ORDER — MELATONIN 3 MG PO TABS
3.0000 mg | ORAL_TABLET | Freq: Every day | ORAL | Status: DC
  Administered 2020-11-03 – 2020-11-04 (×2): 3 mg via ORAL
  Filled 2020-11-03 (×2): qty 1

## 2020-11-03 NOTE — Progress Notes (Signed)
Speech Language Pathology Daily Session Note  Patient Details  Name: [name] MRN: [id number] Date of Birth: 13-Apr-1933  Today's Date: 11/03/2020 SLP Individual Time: 8110-3159 SLP Individual Time Calculation (min): 55 min  Short Term Goals: Week 1: SLP Short Term Goal 1 (Week 1): Patient will perform complex level problem solving tasks in areas of financial and medication management with supervision to mod I SLP Short Term Goal 2 (Week 1): Patient will demonstrate awareness to and self correct/attempt to self correct during problem solving tasks with mod I. SLP Short Term Goal 3 (Week 1): Patient will demonatrate adequate anticipatory awareness during discussion on anticipated needs upon discharge.  Skilled Therapeutic Interventions: Pt was seen for skilled ST targeting cognitive goals. SLP facilitated session by first teaching a novel semi-complex card task (blink), which pt demonstrated ability to problem solve with Supervision A verbal cues. Then, SLP introduced another novel trail making task for pt to have to alternate between trail making and round of card task, repeatedly. She alternated attention between 2 tasks with overall Supervision A verbal cues. When alternating between the 2, cueing for problem solving and awareness increased briefly to Min A verbal and visual cues, but able to fade back to Supervision. Discussed impact of higher level attention deficits on functioning at home and in the community. Pt left sitting in recliner with alarm set and needs within reach. Continue per current plan of care.          Pain Pain Assessment Pain Scale: Faces Faces Pain Scale: No hurt  Therapy/Group: Individual Therapy  Arbutus Leas 11/03/2020, 7:23 AM

## 2020-11-03 NOTE — Progress Notes (Signed)
Occupational Therapy Session Note  Patient Details  Name: [name] MRN: [id number] Date of Birth: 1932-08-29  Today's Date: 11/03/2020 OT Individual Time: 5749-3552 OT Individual Time Calculation (min): 43 min    Short Term Goals: Week 1:  OT Short Term Goal 1 (Week 1): STGS = LTGs  Skilled Therapeutic Interventions/Progress Updates:    1;1. Pt received in bathroom with NT in room. Pt completes full BADL at sink level sit to stand inclusing dressing, bathingand toileting using Rolator with S. Pt requries A only for socks as no sock aide available, however pt able to verbally describe how she used sock aide prior to admission. Pt completes ambulation in hallway on RA and O2 >92% with Rolator. Edu re walking Rolator up to wall prior to locking/turning/resting seated on Rolator to ensure does not move when pt attempts to get up. Pt demo understanding. Exited session with pt seated in recliner, exit alarm on and call light in reach   Therapy Documentation Precautions:  Precautions Precautions: Fall Precaution Comments: Monitor O2 - only on oxygen since admission Restrictions Weight Bearing Restrictions: No General:   Vital Signs: Therapy Vitals Temp: 98.1 F (36.7 C) Pulse Rate: 95 Resp: 18 BP: 128/62 Patient Position (if appropriate): Sitting Oxygen Therapy SpO2: 92 % O2 Device: Room Air Pain:   ADL: ADL Eating: Set up Grooming: Setup Upper Body Bathing: Supervision/safety Where Assessed-Upper Body Bathing: Shower Lower Body Bathing: Supervision/safety Where Assessed-Lower Body Bathing: Shower Upper Body Dressing: Supervision/safety Where Assessed-Upper Body Dressing: Chair Lower Body Dressing: Supervision/safety Where Assessed-Lower Body Dressing: Chair Toileting: Supervision/safety Where Assessed-Toileting: Glass blower/designer: Close supervision Armed forces technical officer Method: Counselling psychologist: Energy manager: Close  supervision Social research officer, government Method: Heritage manager: Systems analyst    Praxis   Exercises:   Other Treatments:     Therapy/Group: Individual Therapy  Tonny Branch 11/03/2020, 6:48 AM

## 2020-11-03 NOTE — Progress Notes (Signed)
PROGRESS NOTE   Subjective/Complaints: Patient seen sitting up in her chair this morning.  She states she did not sleep well last night.  She states she took a sleeping pill but it had a paradoxical effect.  She has questions regarding diet.  ROS: Denies CP, SOB, N/V/D.  Objective:   No results found. No results for input(s): WBC, HGB, HCT, PLT in the last 72 hours. No results for input(s): NA, K, CL, CO2, GLUCOSE, BUN, CREATININE, CALCIUM in the last 72 hours.  Intake/Output Summary (Last 24 hours) at 11/03/2020 1310 Last data filed at 11/03/2020 1227 Gross per 24 hour  Intake 676 ml  Output 3 ml  Net 673 ml        Physical Exam: Vital Signs Blood pressure 128/85, pulse 95, temperature 98.1 F (36.7 C), resp. rate 18, height 4' 11.75" (1.518 m), weight 93.9 kg, SpO2 92 %. Constitutional: No distress . Vital signs reviewed. HENT: Normocephalic.  Atraumatic. Eyes: EOMI. No discharge. Cardiovascular: No JVD.  RRR. Respiratory: Normal effort.  No stridor.  Bilateral clear to auscultation. GI: Non-distended.  BS +. Skin: Warm and dry.  Intact. Psych: Normal mood.  Normal behavior. Musc: Bilateral lower extreme edema No tenderness in extremities. Neuro: Alert and oriented x3. Motor: 4+/5 throughout, unchanged  Assessment/Plan: 1. Functional deficits which require 3+ hours per day of interdisciplinary therapy in a comprehensive inpatient rehab setting.  Physiatrist is providing close team supervision and 24 hour management of active medical problems listed below.  Physiatrist and rehab team continue to assess barriers to discharge/monitor patient progress toward functional and medical goals  Care Tool:  Bathing    Body parts bathed by patient: Right arm,Left arm,Chest,Abdomen,Right upper leg,Left upper leg,Face,Front perineal area,Buttocks,Right lower leg,Left lower leg (long sponge)   Body parts bathed by helper:  Buttocks,Front perineal area     Bathing assist Assist Level: Supervision/Verbal cueing     Upper Body Dressing/Undressing Upper body dressing   What is the patient wearing?: Bra,Pull over shirt    Upper body assist Assist Level: Minimal Assistance - Patient > 75% (min with bra)    Lower Body Dressing/Undressing Lower body dressing      What is the patient wearing?: Underwear/pull up,Pants     Lower body assist Assist for lower body dressing: Supervision/Verbal cueing     Toileting Toileting    Toileting assist Assist for toileting: Supervision/Verbal cueing     Transfers Chair/bed transfer  Transfers assist     Chair/bed transfer assist level: Supervision/Verbal cueing     Locomotion Ambulation   Ambulation assist      Assist level: Supervision/Verbal cueing Assistive device: Rollator Max distance: 256ft   Walk 10 feet activity   Assist     Assist level: Supervision/Verbal cueing Assistive device: Rollator   Walk 50 feet activity   Assist    Assist level: Supervision/Verbal cueing Assistive device: Rollator    Walk 150 feet activity   Assist Walk 150 feet activity did not occur: Safety/medical concerns (Standing rest break)  Assist level: Supervision/Verbal cueing Assistive device: Rollator    Walk 10 feet on uneven surface  activity   Assist Walk 10 feet on uneven surfaces  activity did not occur: Safety/medical concerns   Assist level: Supervision/Verbal cueing Assistive device: Rollator   Wheelchair     Assist Will patient use wheelchair at discharge?: No             Wheelchair 50 feet with 2 turns activity    Assist            Wheelchair 150 feet activity     Assist            Medical Problem List and Plan: 1.  Decrease in endurance, fatigue, unsteady gait, difficulty following commands, intermittent confusion secondary to encephalopathy.  Continue CIR 2.   Antithrombotics: -DVT/anticoagulation:  Pharmaceutical: Continue Lovenox             -antiplatelet therapy: ASA 3. Chronic LBP/Pain Management: N/A             --gets ESI by Dr. Nelva Bush             Monitor with increased exertion 4. Mood: LCSW to follow for evaluation and support.              -antipsychotic agents: N/A 5. Neuropsych: This patient is not fully capable of making decisions on her own behalf. 6. Skin/Wound Care: Routine pressure relief measures. 7. Fluids/Electrolytes/Nutrition: Strict I/Os. Low salt diet. 8. Fluid overload: Off lasix             --had stopped HCTZ due to frequency.               Filed Weights   11/02/20 0437 11/02/20 1721 11/03/20 0634  Weight: 95.2 kg 95 kg 93.9 kg    Relatively controlled on 3/24 9.  Hypercarbic respiratory failure/OSA?/COPD?: H/o pulmonary nodules with cavitation due to PNA in 2007?             stopped using inhalers due to cough. Not on MDI at home.    Encourage pulmonary hygiene.  Encourage CPAP as able. Educating patient 10. Hyponatremia: Resolved  Likely due to diuresis. 11. Morbid obesity (BMI 40.25): Encourage weight loss 12. Mild AKI: Resolved 13.  Hypertension  Lisinopril 2.5 started on 3/21  Relatively controlled on 3/24 14.  Prediabetes  Diet change to carb modified diet  Monitor his increase mobility 15.  Sleep disturbance  Patient we will take melatonin  She is very falling asleep too hard, because she does not want someone to place CPAP on her face  LOS: 8 days A FACE TO FACE EVALUATION WAS PERFORMED  Arianis Bowditch Lorie Phenix 11/03/2020, 1:10 PM

## 2020-11-03 NOTE — Discharge Summary (Addendum)
Physician Discharge Summary  Patient ID: [name] MRN: [id number] DOB/AGE: [birth year] 87 y.o.  Admit date: 10/26/2020 Discharge date: 11/05/2020  Discharge Diagnoses:  Principal Problem:   Encephalopathy Active Problems:   Stroke (cerebrum) (HCC)   Hyperglycemia   Hypercapnia   Prediabetes   Sleep disturbance   Benign essential HTN   Discharged Condition:  Stable   Significant Diagnostic Studies: N/A   Labs:  Basic Metabolic Panel: BMP Latest Ref Rng & Units 10/31/2020 10/27/2020 10/26/2020  Glucose 70 - 99 mg/dL 141(H) 112(H) 122(H)  BUN 8 - 23 mg/dL 14 24(H) 21  Creatinine 0.44 - 1.00 mg/dL 0.86 1.07(H) 1.08(H)  Sodium 135 - 145 mmol/L 136 137 134(L)  Potassium 3.5 - 5.1 mmol/L 4.3 4.0 3.9  Chloride 98 - 111 mmol/L 93(L) 93(L) 90(L)  CO2 22 - 32 mmol/L 38(H) 39(H) 35(H)  Calcium 8.9 - 10.3 mg/dL 8.9 8.7(L) 8.9    CBC: CBC Latest Ref Rng & Units 10/31/2020 10/27/2020 10/26/2020  WBC 4.0 - 10.5 K/uL 6.1 7.0 10.4  Hemoglobin 12.0 - 15.0 g/dL 12.5 12.7 13.5  Hematocrit 36.0 - 46.0 % 41.6 40.6 42.0  Platelets 150 - 400 K/uL 164 134(L) 164    CBG: No results for input(s): GLUCAP in the last 168 hours.  Brief HPI:   [name] is a 87 y.o. female with history of HTN, COPD, OA, chronic back pain who was admitted on 10/21/20 with increase in shortness of breath and lower extremity edema after being taken off her diuretics.  She was hypoxic at admission requiring IV diuresis as well as supplemental oxygen.  She did develop lethargy with her tremors and confusion while being evaluated in the ED.  MRI/MRI brain revealed acute small left frontal white matter infarct with widespread intracranial atherosclerosis.    She was transferred to [facility] for work-up and intubated and sedated prior to transport.  EEG ordered for work-up due to bilateral hand tremors and was negative for seizures.  She tolerated extubation to nasal cannula.  Dr. Leonie Man felt the stroke was due to  small vessel disease and low-dose aspirin acid added for clinically silent strokes.  Patient has had issues with confusion due to hypercarbia, hypoxia requiring supplemental oxygen as well as tachypnea with activity.  Therapy was ongoing and patient was limited by fatigue with decreased endurance, unsteady gait as well as intermittent confusion.  CIR was recommended due to functional decline.   Hospital Course: [name] was admitted to rehab 10/26/2020 for inpatient therapies to consist of PT, ST and OT at least three hours five days a week. Past admission physiatrist, therapy team and rehab RN have worked together to provide customized collaborative inpatient rehab. Her blood pressures were monitored on TID basis and was noted to be trending upwards therefore low-dose lisinopril was added for control.  She was educated importance of pulmonary hygiene as well as use of flutter valve.  Dulera MDI was added with improvement in respiratory status and she was weaned off oxygen by discharge.   Her mentation has improved and confusion has resolved.  Her p.o. intake has been good and mild AKI has resolved.  Due to prediabetes diet was changed to carb modified and she was educated on appropriate diet.  She has had issues with insomnia and melatonin was added to help with sleep hygiene.  Constipation has resolved after Colace was changed to as needed.  She has made steady progress and supervision is recommended for safety.  She  will continue to receive follow-up home health PT, OT, SP by [facility] after discharge   Rehab course: During patient's stay in rehab weekly team conferences were held to monitor patient's progress, set goals and discuss barriers to discharge. At admission, patient required CGA for mobility and supervision with ADL tasks. She exhibited mild cognitive impairments with verbosity and tangential verbaol output. She has had improvement in activity tolerance, balance, postural  control as well as ability to compensate for deficits.  She is able to complete ADL tasks with supervision.  She is modified independent for transfers and requires rest breaks with Rollator to ambulate 200 feet.  She requires close supervision to climb 12 steps.  She is able to complete cognitive tasks with supervision to modified independent level.  Family is aware of team's recommendation and patient/family has hired assistance to provide supervision to help with transition to home.   Disposition:  Home  Diet: Carb modified/2 gram salt.   Special Instructions: 1. Continue to use flutter valve qid.   Discharge Instructions     Ambulatory referral to Physical Medicine Rehab   Complete by: As directed    3-4 week Follow up appt      Allergies as of 11/05/2020       Reactions   Ceftin [cefuroxime] Diarrhea   Clindamycin/lincomycin Itching   Simvastatin    Muscle pain with large dose   Doxycycline Hyclate Diarrhea, Rash   Penicillins Rash        Medication List     STOP taking these medications    calcium carbonate 1500 (600 Ca) MG Tabs tablet Commonly known as: OSCAL       TAKE these medications    acetaminophen 325 MG tablet Commonly known as: TYLENOL Take 1-2 tablets (325-650 mg total) by mouth every 4 (four) hours as needed for mild pain.   aspirin 81 MG EC tablet Take 1 tablet (81 mg total) by mouth daily. Swallow whole.   docusate sodium 100 MG capsule Commonly known as: COLACE Take 1 capsule (100 mg total) by mouth daily as needed for mild constipation.   lisinopril 2.5 MG tablet Commonly known as: ZESTRIL Take 1 tablet (2.5 mg total) by mouth daily.   melatonin 3 MG Tabs tablet Take 1 tablet (3 mg total) by mouth at bedtime.   mometasone-formoterol 100-5 MCG/ACT Aero Commonly known as: DULERA Inhale 2 puffs into the lungs 2 (two) times daily.   simvastatin 5 MG tablet Commonly known as: ZOCOR Take 5 mg by mouth daily.   VITAMIN D PO Take  1,000 Units by mouth daily.        Follow-up Information     Shirline Frees, MD. Call.   Specialty: Family Medicine Why: for post hospital check.  Contact information: [address], [address] [place] [address]         Jamse Arn, MD Follow up.   Specialty: Physical Medicine and Rehabilitation Why: Office will call you with follow  up appointment Contact information: [address] [place] [address] [phone]                 Signed: Bary Leriche 11/08/2020, 7:42 PM Patient was seen, face-face, and physical exam performed by me on day of discharge, greater than 30 minutes of total time spent.. Please see progress note from day of discharge as well.  Delice Lesch, MD, ABPMR

## 2020-11-03 NOTE — Progress Notes (Signed)
Physical Therapy Session Note  Patient Details  Name: [name] MRN: [id number] Date of Birth: 1932/10/10  Today's Date: 11/03/2020 PT Individual Time: 1530-1600 PT Individual Time Calculation (min): 30 min   Short Term Goals: Week 1:  PT Short Term Goal 1 (Week 1): STG = LTG due to ELOS  Skilled Therapeutic Interventions/Progress Updates:    Patient in recliner in room with sister and niece present who just came from [place].  Patient agreeable to PT.  Reports using facility rollator rather than her own that has been delivered.  Patient sit to stand with S and ambulated with rollator on RA throughout session with S x 100-120' x 4 with seated rest breaks in rollator between ambulation bouts. Noted SpO2 on RA with ambulation seemed to stay 90% or above, but during seated rest dropped as low as 85% initially in sitting, but recovers to 90% within 1 minute of sitting.  Educated throughout on d/c process and pursed lip breathing, energy conservation and noticing signs that she needs to sit to rest or stop talking, etc.  Patient toileted with distant S once returned to room.  Left seated in recliner with sister and niece in the room and chair alarm active, needs in reach.   Therapy Documentation Precautions:  Precautions Precautions: Fall Precaution Comments: Monitor O2 - only on oxygen since admission Restrictions Weight Bearing Restrictions: No Pain: Pain Assessment Pain Scale: Faces Pain Score: 0-No pain Faces Pain Scale: No hurt   Therapy/Group: Individual Therapy  Reginia Naas  Magda Kiel, [place] 11/03/2020, 5:37 PM

## 2020-11-04 DIAGNOSIS — G8929 Other chronic pain: Secondary | ICD-10-CM

## 2020-11-04 DIAGNOSIS — M545 Low back pain, unspecified: Secondary | ICD-10-CM

## 2020-11-04 MED ORDER — MELATONIN 3 MG PO TABS: 3.0000 mg | ORAL_TABLET | Freq: Every day | ORAL | 0 refills | Status: DC

## 2020-11-04 MED ORDER — DOCUSATE SODIUM 100 MG PO CAPS: 100.0000 mg | ORAL_CAPSULE | Freq: Every day | ORAL | 0 refills | Status: DC | PRN

## 2020-11-04 MED ORDER — MOMETASONE FURO-FORMOTEROL FUM 100-5 MCG/ACT IN AERO: 2.0000 | INHALATION_SPRAY | Freq: Two times a day (BID) | RESPIRATORY_TRACT | 0 refills | Status: DC

## 2020-11-04 MED ORDER — DOCUSATE SODIUM 100 MG PO CAPS: 100.0000 mg | ORAL_CAPSULE | Freq: Two times a day (BID) | ORAL | 0 refills | Status: DC

## 2020-11-04 MED ORDER — LISINOPRIL 2.5 MG PO TABS: 2.5000 mg | ORAL_TABLET | Freq: Every day | ORAL | 0 refills | Status: DC

## 2020-11-04 NOTE — Progress Notes (Signed)
Speech Language Pathology Discharge Summary  Patient Details  Name: [name] MRN: [id number] Date of Birth: 04-13-1933  Today's Date: 11/04/2020 SLP Individual Time: 0805-0850       45 minutes     Skilled Therapeutic Interventions:  Patient seen for skilled ST session focusing on alternating attention task and discharge education. Patient able to complete alternating attention task after SLP provided instructions, with supervision to mod I for 100% accuracy. SLP wrote down list of strategies for patient for managing self at home: limit external distractions, schedule/plan day, do one task at a time. Patient verbalized understanding.     Patient has met 2 of 2 long term goals.  Patient to discharge at overall Supervision;Modified Independent level.  Reasons goals not met: N/A   Clinical Impression/Discharge Summary: Patient met 2/2 LTG's for higher level attention and awareness after they had been downgraded from modified independent to supervision. Patient continues to have difficulty with alternating attention and is very talkative and verbose however patient reports that she has always been "hyper". 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 Patient to be discharging home with friend planning to stay with her for about a week. SLP is recommending brief period of [facility] SLP to focus on medication management, etc and higher level attention.  Care Partner:  Caregiver Able to Provide Assistance: Yes  Type of Caregiver Assistance: Cognitive  Recommendation:  Home Health SLP  Rationale for SLP Follow Up: Maximize cognitive function and independence   Equipment: N/A   Reasons for discharge: Discharged from hospital;Treatment goals met   Patient/Family Agrees with Progress Made and Goals  Achieved: Yes    Sonia Baller, MA, CCC-SLP Speech Therapy

## 2020-11-04 NOTE — Progress Notes (Signed)
Occupational Therapy Session Note  Patient Details  Name: [name] MRN: [id number] Date of Birth: August 23, 1932  Today's Date: 11/04/2020 OT Individual Time: 0930-1030 OT Individual Time Calculation (min): 60 min    Short Term Goals: Week 1:  OT Short Term Goal 1 (Week 1): STGS = LTGs  Skilled Therapeutic Interventions/Progress Updates:    Pt received in recliner fully dressed and ready for the day. She reported she was able to do all of her self care herself with the NT present for safety. Pt wanted to be ready prior to speech therapy.   Pt worked on ambulation in and out of the bathroom with her rollater and use of the bathroom with no A and no safety issues.  Pt is MOD I with toileting and transfers.   Worked on light housekeeping with putting pillow cases on and fixing bed.  She has a housekeeper that comes 1 a month.  She is able to organize her belongings moving around her room well with the rollator. Completed grooming standing at the sink independently.  Discussed strategies at home for easy cooking and healthy meal prep. Pt is ready to go home tomorrow and feels very prepared. She did well on room air today, no fatigue.  Pt resting in recliner with all needs met.   Therapy Documentation Precautions:  Precautions Precautions: Fall Weight Bearing Restrictions: No    Vital Signs: Oxygen Therapy SpO2: 95 % O2 Device: Room Air Pain: Pain Assessment Pain Score: 0-No pain ADL: ADL Eating: Independent Grooming: Independent Where Assessed-Grooming: Standing at sink Upper Body Bathing: Supervision/safety Where Assessed-Upper Body Bathing: Shower Lower Body Bathing: Supervision/safety Where Assessed-Lower Body Bathing: Shower Upper Body Dressing: Independent Where Assessed-Upper Body Dressing: Chair Lower Body Dressing: Modified independent Where Assessed-Lower Body Dressing: Chair Toileting: Modified independent Where Assessed-Toileting: Glass blower/designer:  Diplomatic Services operational officer Method: Counselling psychologist: Energy manager: Close supervision Social research officer, government Method: Heritage manager: Grab bars   Therapy/Group: Individual Therapy  SAGUIER,JULIA 11/04/2020, 12:31 PM

## 2020-11-04 NOTE — Discharge Instructions (Signed)
Inpatient Rehab Discharge Instructions  [name] Discharge date and time: 11/05/20   Activities/Precautions/ Functional Status: Activity: no lifting, driving, or strenuous exercise for till cleared by MD Diet: cardiac diet and diabetic diet--2 gram salt.  Wound Care: none needed    Functional status:  ___ No restrictions     ___ Walk up steps independently _X__ 24/7 supervision/assistance   ___ Walk up steps with assistance ___ Intermittent supervision/assistance  ___ Bathe/dress independently ___ Walk with walker     _X__ Bathe/dress with assistance ___ Walk Independently    ___ Shower independently ___ Walk with assistance    ___ Shower with assistance _X__ No alcohol     ___ Return to work/school ________  Special Instructions:     COMMUNITY REFERRALS UPON DISCHARGE:    Home Health:   PT, OT, SP                 Agency: [facility]                                                 Agency/Supplier:[facility]  [phone]   My questions have been answered and I understand these instructions. I will adhere to these goals and the provided educational materials after my discharge from the hospital.  Patient/Caregiver Signature _______________________________ Date __________  Clinician Signature _______________________________________ Date __________  Please bring this form and your medication list with you to all your follow-up doctor's appointments.                            General, Healthful Nutrition Therapy  If you are interested in a following a general, healthful diet or if your registered dietitian nutritionist (RDN) or doctor has recommended it to you, this guide can help provide you with the basic knowledge you'll need. The general healthful diet can be tailored to your personal preferences. There are several benefits to following a general,  healthful diet: . Depending on your food choices, it could mean less calories, less salt, less added sugars, and less saturated fat and trans fat than many other diets.  . When you focus on eating more whole grains, legumes, fruits, vegetables, nuts, and seeds, you may improve how much fiber, vitamins, and minerals you eat.  . It can lower your risk of conditions like diabetes, heart disease, hypertension, stroke, and cancer.   Tips . Eat at least 5 servings of fruits and vegetables every day.  . Don't focus only on green vegetables. There are special health benefits to eating blue-purple, yellow, orange, and red vegetables.  . Eat more legumes (like beans and lentils) and more whole grains.  . Try meatless alternatives.  . In place of meat, you can get your protein from eating eggs, fish, poultry, beans, peas, soy-based foods, and nuts/nut butters  . Low-fat or fat-free dairy products are also good sources of protein. Marland Kitchen Keep your salt intake to a minimum (less than 2300 milligrams per day).  . Avoid adding salt, soy sauce or fish sauce to your food when cooking.  . Eat freshly prepared meals at home. Processed foods and restaurant foods contain more salt.  . Fresh fruits and vegetables are the best choices for snacks.  . When shopping, choose the products with lower  sodium content. . Limit your daily sugar intake.  . Sugar can be found in honey, syrups, jelly, fruit juice, and fruit juice concentrate.  . Limit sugar-sweetened beverages like soda pop and fruit juice, sugary snacks, and candy  . It's best to avoid products with added sugar, but if you do eat them, read labels carefully so you know how much sugar is in each portion.  . It is better to eat unsaturated fats than saturated fats. Avoid trans fats as much as possible.  Marland Kitchen Unsaturated fat is found in fish, avocado, nuts, and oils like sunflower, canola, and olive oils.  . Saturated fat is found in fatty meat, butter, ice cream, palm  and coconut oil, cream, cheese, and lard.  . Trans fats are found in many processed foods, margarines, fried foods, fast food items, convenience foods like frozen pizza and snack foods, and sweets including pies, cookies, and other pastries. Check nutrition labels.  . When cooking, use vegetable oil instead of animal oil.  . Boil, steam, or bake your food instead of frying.  . If you eat meat, remove the fatty part before cooking.  Foods Recommended: Include a variety of the following whole foods. Choose a healthful balance of foods from each category at your meals. Be sure the meals don't exceed your recommended calorie limit so you can achieve and/or maintain a healthy weight.   Food Group Foods Recommended  Grains Choose whole grains for at least half of grain selections, including whole wheat, barley, rye, buckwheat, corn, teff, quinoa, millet, amaranth, brown and wild rice, sorghum, and oats Focus on intact cooked whole grains Choose grain products, such as bread, rolls, prepared breakfast cereals, crackers, and pasta made from whole grains that are low in added sugars, saturated fat, and sodium  Protein Foods Fresh or frozen red meat, including lean, trimmed cuts of beef, pork, or lamb a few times per week or less; avoid processed meats, such as bacon, sausage, and ham Fresh or frozen poultry, including skinless chicken or [place], avoid processed meats that are higher in sodium Fresh, frozen, or canned seafood, including fish, shrimp, lobster, clams, and scallops at least twice per week. Focus on fatty fish, such as salmon, herring, and sardines, as a rich source of omega-3 fatty acids, and limit those which have a greater risk for contamination, including king mackerel, shark, and tilefish Eggs Nuts and seeds, such as peanuts, almonds, pistachios, and sunflower seeds (unsalted varieties) Nut and seed butters, such as peanut butter, almond butter, and sunflower seed butter, (reduced-sodium  varieties)  Soy foods, such as tofu, tempeh, or soy nuts Meat alternatives, such as veggie burgers, and sausages based on plant protein (reduced-sodium varieties) Unsalted legumes, such as dried beans, lentils, or peas at least a few times per week in place of other protein sources  Dairy Low-fat or fat-free milk, yogurt (low in added sugars), cottage cheese, and cheeses Frozen desserts made from low-fat milk that are low in added sugars (no more than 5 grams added sugars per serving) Fortified soymilk  Vegetables A variety of fresh, frozen, and canned (unsalted) whole vegetables, including dark-green, red and orange vegetables, legumes (beans and peas), and starchy vegetables; low-sodium vegetable juices  Fruits A variety of fresh, frozen, canned and dried, whole unsweetened fruits canned fruit packed in water or fruit juice without added sugar) 100% fruit juice (limited to one serving per day)  Oils and Fats Use in moderation, up to 5 servings per day: Unsaturated vegetable oils, including  olive, peanut, and canola oils Margarines and spreads, which list liquid vegetable oil as the first ingredient and do not contain trans fats (partially hydrogenated oil) Salad dressing and mayonnaise made from unsaturated vegetable oils  Beverages Coffee, tea (unsweetened), water, 100% fruit juice (limited to one serving per day) Avoid sweetened beverages, including soda, sweetened tea, sports drinks, energy drinks, and coffee drinks  Other Prepared foods, including soups, casseroles, salads, baked goods, and snacks made from recommended ingredients, with low levels of added saturated fat, added sugars, or salt   The following foods should be included occasionally, if at all.  Food Group Foods Not Recommended  Grains Sweetened, low-fiber breakfast cereals (less than 2 grams of fiber per serving) Packaged (high sugar, refined ingredients) baked goods Snack crackers and chips made of refined ingredients,  cheese crackers, butter crackers Breads made with refined ingredients and saturated fats, such as biscuits, frozen waffles, sweet breads, doughnuts, pastries, packaged baking mixes, pancakes, cakes, and cookies  Protein Foods Marbled or fatty red meats (beef, pork, lamb), such as ribs Processed red meats, such as bacon, sausage, and ham Poultry (chicken and [place]) with skin Fried meats, poultry, or fish Deli meats, such as pastrami, bologna, or salami (made of meat or poultry) Fried eggs Salted legumes, nuts, seeds, or nut/seed butters Meat alternatives with high levels of sodium or saturated fat  Dairy Whole milk, cream, cheeses made from whole milk, sour cream Yogurt or ice cream made from whole milk or with added sugar Cream cheese made from whole milk  Vegetables Canned or frozen vegetables with salt, fresh vegetables prepared with salt Fried vegetables Vegetables in cream sauce or cheese sauce Tomato or pasta sauce with high levels of salt or sugar   Fruits Fruits packed in syrup or made with added sugar  Oils Solid shortening or partially hydrogenated oils Solid margarine made with hydrogenated or partially hydrogenated oils Margarine that contains trans fats; butter  Beverages Sweetened drinks, including sweetened coffee or tea drinks, soda, energy drinks, and sports drinks  Alcohol (for adults >18 years of age) If you choose to drink, women should have no more than one drink per day and men should have no more than two per day (One drink is measured as 5 ounces wine; 12 ounces beer, 1.5 ounces spirits.)   Other  Sugary and/or fatty desserts, candy, and other sweets; salt and seasonings that contain salt Fried foods     [employer] Breakfast 1 cup oatmeal  1/2 cup blueberries 1 ounce almonds 1 cup low-fat or fat-free milk 1 cup coffee  Lunch 2 slices whole wheat bread 3 ounces [place] slices 1/4 cup lettuce for sandwich 2 slices tomato for sandwich 1 ounce reduced-fat,  reduced sodium cheese 1/2 cup fresh carrot sticks 1/4 cup hummus 1 banana 1 cup milk 1 cup unsweetened tea  Evening Meal 4 ounces baked salmon with basil 1 cup quinoa 1 cup green beans 1 cup mixed greens salad 1 teaspoon olive oil mixed with vinegar of choice  1 whole wheat dinner roll 1 teaspoon margarine (for roll) 1/2 cup applesauce 1 cup water  Evening Snack 1 cup low-fat yogurt 1/2 cup sliced peaches

## 2020-11-04 NOTE — Progress Notes (Signed)
Occupational Therapy Discharge Summary  Patient Details  Name: [name] MRN: [id number] Date of Birth: 09-25-1932     Patient has met 13  of 13 long term goals due to improved activity tolerance, improved balance, postural control, ability to compensate for deficits, improved attention and improved awareness.  Patient to discharge at overall Supervision level.  Patient's care partner is independent to provide the necessary physical and cognitive assistance at discharge.    Reasons goals not met: n/a  Recommendation:  Patient will benefit from ongoing skilled OT services in home health setting to continue to advance functional skills in the area of BADL and iADL.  Equipment: No equipment provided  Reasons for discharge: treatment goals met  Patient/family agrees with progress made and goals achieved: Yes  OT Discharge Precautions/Restrictions  Precautions Precautions: Fall Precaution Comments: on room air only Restrictions Weight Bearing Restrictions: No Pain Pain Assessment Pain Score: 0-No pain ADL ADL Eating: Independent Grooming: Independent Where Assessed-Grooming: Standing at sink Upper Body Bathing: Supervision/safety Where Assessed-Upper Body Bathing: Shower Lower Body Bathing: Supervision/safety Where Assessed-Lower Body Bathing: Shower Upper Body Dressing: Independent Where Assessed-Upper Body Dressing: Chair Lower Body Dressing: Modified independent Where Assessed-Lower Body Dressing: Chair Toileting: Modified independent Where Assessed-Toileting: Glass blower/designer: Diplomatic Services operational officer Method: Counselling psychologist: Energy manager: Close supervision Social research officer, government Method: Heritage manager: Education officer, environmental Vision Patient Visual Report: No change from baseline Vision Assessment?: No apparent visual deficits Perception  Perception: Within Functional  Limits Praxis Praxis: Intact Cognition Overall Cognitive Status: Impaired/Different from baseline Memory: Impaired Memory Impairment: Retrieval deficit Sensation Sensation Light Touch: Appears Intact Hot/Cold: Appears Intact Proprioception: Appears Intact Stereognosis: Appears Intact Motor  Motor Motor: Within Functional Limits Motor - Discharge Observations: generalized motor weakness - improved since date of evaluation Mobility  Transfers Stand to Sit: Independent with assistive device  Trunk/Postural Assessment  Cervical Assessment Cervical Assessment: Within Functional Limits Thoracic Assessment Thoracic Assessment: Within Functional Limits Lumbar Assessment Lumbar Assessment: Within Functional Limits Postural Control Postural Control: Within Functional Limits  Balance Dynamic Sitting Balance Dynamic Sitting - Level of Assistance: 7: Independent Static Standing Balance Static Standing - Level of Assistance: 7: Independent Dynamic Standing Balance Dynamic Standing - Level of Assistance: 6: Modified independent (Device/Increase time) Extremity/Trunk Assessment RUE Assessment General Strength Comments: 4/5 LUE Assessment General Strength Comments: 4/5   [employer] 11/04/2020, 12:49 PM

## 2020-11-04 NOTE — Progress Notes (Signed)
Occupational Therapy Session Note  Patient Details  Name: [name] MRN: [id number] Date of Birth: July 29, 1933  Today's Date: 11/04/2020 OT Individual Time: 6435-3912 OT Individual Time Calculation (min): 35 min    Short Term Goals: Week 1:  OT Short Term Goal 1 (Week 1): STGS = LTGs  Skilled Therapeutic Interventions/Progress Updates:    Pt completed transfer from the bathroom to the recliner with supervision to start session after finishing toileting.  Educated pt on AROM exercises for BUEs as well as light therapy band exercises with handout provided.  She was able to return demonstrate completion of shoulder flexion, abduction, adduction, scaption, and horizontal abduction for one set of 10 reps for each exercise.  Finished session with pt in the recliner with the call button and phone in reach.  Safety alarm in place.    Therapy Documentation Precautions:  Precautions Precautions: Fall Precaution Comments: on room air only Restrictions Weight Bearing Restrictions: No  Pain: Pain Assessment Pain Scale: Faces Pain Score: 0-No pain ADL: See Care Tool Section for some details of mobility and selfcare  Therapy/Group: Individual Therapy  Kissy Cielo OTR/L 11/04/2020, 4:55 PM

## 2020-11-04 NOTE — Progress Notes (Signed)
Physical Therapy Session Note  Patient Details  Name: [name] MRN: [id number] Date of Birth: 09-May-1933  Today's Date: 11/04/2020 PT Individual Time: 1130-1200 PT Individual Time Calculation (min): 30 min   Short Term Goals: Week 1:  PT Short Term Goal 1 (Week 1): STG = LTG due to ELOS  Skilled Therapeutic Interventions/Progress Updates:     Pt received seated in recliner and agrees to therapy. No complaint of pain. Sit to stand and stand step transfer to [facility] with rollator and mod(I). WC transport to gym for time management. Pt transfers to from mat table at [facility]). Pt performs sit<>supine independently.O2 checked and at 94% on room air. Pt ambulates x200' with rollator, then verbalizes need to use rest room. Pt transfers to/from toilet mod(I) and performs pericare independently. Pt then completes x12 steps with bilateral hand rails and close supervision, with cues for body mechanics and foot placement. Pt ambulates from WC to recliner and left seated with alarm intact and all needs within reach.  Therapy Documentation Precautions:  Precautions Precautions: Fall Precaution Comments: on room air only Restrictions Weight Bearing Restrictions: No    Therapy/Group: Individual Therapy  Breck Coons, PT, DPT 11/04/2020, 4:45 PM

## 2020-11-04 NOTE — Progress Notes (Signed)
Patient ID: [name], female   DOB: [birth year], 87 y.o.   MRN: [id number]  Met with pt, her sister and niece who is here from [facility] to answer questions. MD was in the room also and addressed medical questions. Discussed supervision and home health follow and that they would contact her at home to set up appointments. Family took rollator home yesterday. Pt will have 24/7 for a short time at discharge. All feel questions answered and ready for discharge tomorrow.

## 2020-11-04 NOTE — Plan of Care (Signed)
Nutrition Education Note  RD consulted for nutrition education regarding prediabetes. RD working remotely.   Lab Results  Component Value Date   HGBA1C 5.9 (H) 10/24/2020   Spoke with pt via phone call to room. Pt states that she has a great appetite and has been eating well. Pt denies any nutrition-related issues that this time.  Briefly discussed general, healthful diet with pt via phone call. Pt requesting handouts to review at her convenience.  RD has attached "General, Healthful Nutrition Therapy" handout from the Academy of [employer]. To pt's AVS/Discharge Instructions. Handout provides information regarding foods recommended and foods not recommended for a general, healthful diet. Handout also provides information regarding reducing sugar intake and choosing lean protein options as well as increase fruit and vegetable intake and proper serving sizes.  Expect fair to good compliance.  Current diet order is Carb Modified, patient is consuming approximately 100% of meals at this time. Labs and medications reviewed. No further nutrition interventions warranted at this time. RD contact information provided. If additional nutrition issues arise, please re-consult RD.   Gustavus Bryant, MS, RD, LDN Inpatient Clinical Dietitian Please see [facility] for contact information.

## 2020-11-04 NOTE — Progress Notes (Signed)
PROGRESS NOTE   Subjective/Complaints: Patient seen sitting up in her chair this AM.  Family at bedside.  She states she was very tired yesterday and was about to go to sleep, but once she transferred to the bed she was unable to sleep.  She is convinced that she is not sleeping because of the bed.  Patient and family with questions regarding level of assistance at discharge, discussed with patient.  ROS: Denies CP, SOB, N/V/D.  Objective:   No results found. No results for input(s): WBC, HGB, HCT, PLT in the last 72 hours. No results for input(s): NA, K, CL, CO2, GLUCOSE, BUN, CREATININE, CALCIUM in the last 72 hours.  Intake/Output Summary (Last 24 hours) at 11/04/2020 1303 Last data filed at 11/04/2020 0804 Gross per 24 hour  Intake 560 ml  Output 3 ml  Net 557 ml        Physical Exam: Vital Signs Blood pressure (!) 135/6, pulse (!) 101, temperature 97.6 F (36.4 C), resp. rate 20, height 4' 11.75" (1.518 m), weight 93.6 kg, SpO2 95 %. Constitutional: No distress . Vital signs reviewed. HENT: Normocephalic.  Atraumatic. Eyes: EOMI. No discharge. Cardiovascular: No JVD.  RRR. Respiratory: Normal effort.  No stridor.  Bilateral clear to auscultation. GI: Non-distended.  BS +. Skin: Warm and dry.  Intact. Psych: Normal mood.  Normal behavior. Musc: Bilateral lower extreme edema, stable No tenderness in extremities. Neuro: Alert and oriented x3. Motor: 4+-5/5 throughout, unchanged  Assessment/Plan: 1. Functional deficits which require 3+ hours per day of interdisciplinary therapy in a comprehensive inpatient rehab setting.  Physiatrist is providing close team supervision and 24 hour management of active medical problems listed below.  Physiatrist and rehab team continue to assess barriers to discharge/monitor patient progress toward functional and medical goals  Care Tool:  Bathing    Body parts bathed by  patient: Right arm,Left arm,Chest,Abdomen,Right upper leg,Left upper leg,Face,Front perineal area,Buttocks,Right lower leg,Left lower leg   Body parts bathed by helper: Buttocks,Front perineal area     Bathing assist Assist Level: Supervision/Verbal cueing     Upper Body Dressing/Undressing Upper body dressing   What is the patient wearing?: Bra,Pull over shirt    Upper body assist Assist Level: Supervision/Verbal cueing    Lower Body Dressing/Undressing Lower body dressing      What is the patient wearing?: Underwear/pull up,Pants     Lower body assist Assist for lower body dressing: Supervision/Verbal cueing     Toileting Toileting    Toileting assist Assist for toileting: Independent with assistive device     Transfers Chair/bed transfer  Transfers assist     Chair/bed transfer assist level: Supervision/Verbal cueing     Locomotion Ambulation   Ambulation assist      Assist level: Supervision/Verbal cueing Assistive device: Rollator Max distance: 230ft   Walk 10 feet activity   Assist     Assist level: Supervision/Verbal cueing Assistive device: Rollator   Walk 50 feet activity   Assist    Assist level: Supervision/Verbal cueing Assistive device: Rollator    Walk 150 feet activity   Assist Walk 150 feet activity did not occur: Safety/medical concerns (Standing rest break)  Assist level:  Supervision/Verbal cueing Assistive device: Rollator    Walk 10 feet on uneven surface  activity   Assist Walk 10 feet on uneven surfaces activity did not occur: Safety/medical concerns   Assist level: Supervision/Verbal cueing Assistive device: Rollator   Wheelchair     Assist Will patient use wheelchair at discharge?: No             Wheelchair 50 feet with 2 turns activity    Assist            Wheelchair 150 feet activity     Assist            Medical Problem List and Plan: 1.  Decrease in endurance,  fatigue, unsteady gait, difficulty following commands, intermittent confusion secondary to encephalopathy.  Continue CIR, patient and family education 2.  Antithrombotics: -DVT/anticoagulation:  Pharmaceutical: Continue Lovenox             -antiplatelet therapy: ASA 3. Chronic LBP/Pain Management:              --gets ESI by Dr. Nelva Bush  Controlled on 2/25             Monitor with increased exertion 4. Mood: LCSW to follow for evaluation and support.              -antipsychotic agents: N/A 5. Neuropsych: This patient is not fully capable of making decisions on her own behalf. 6. Skin/Wound Care: Routine pressure relief measures. 7. Fluids/Electrolytes/Nutrition: Strict I/Os. Low salt diet. 8. Fluid overload: Off lasix             --had stopped HCTZ due to frequency.               Filed Weights   11/02/20 1721 11/03/20 0634 11/04/20 0428  Weight: 95 kg 93.9 kg 93.6 kg    Controlled on 3/25 9.  Hypercarbic respiratory failure/OSA?/COPD?: H/o pulmonary nodules with cavitation due to PNA in 2007?             stopped using inhalers due to cough. Not on MDI at home.    Encourage pulmonary hygiene.  Encourage CPAP as able. Educating patient 10. Hyponatremia: Resolved  Likely due to diuresis. 11. Morbid obesity (BMI 40.25): Encourage weight loss 12. Mild AKI: Resolved 13.  Hypertension  Lisinopril 2.5 started on 3/21  Overall controlled on 3/25 14.  Prediabetes  Diet change to carb modified diet  Dietitian consulted  Monitor his increase mobility 15.  Sleep disturbance  She is very falling asleep too hard, because she does not want someone to place CPAP on her face  Believes it is because of her bed  LOS: 9 days A FACE TO FACE EVALUATION WAS PERFORMED  Ankit Lorie Phenix 11/04/2020, 1:03 PM

## 2020-11-04 NOTE — Progress Notes (Signed)
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 GOOD SUPPORT FROM FRIENDS AND NEIGHBORS ALSO  Patient/Family verbalized understanding of follow-up arrangements: Yes  Individual responsible for coordination of the follow-up plan: BILLIE-SISTER [phone]  Confirmed correct DME delivered: Elease Hashimoto 11/04/2020    Elease Hashimoto

## 2020-11-05 NOTE — Progress Notes (Signed)
INPATIENT REHABILITATION DISCHARGE NOTE   Discharge instructions by: Reesa Chew PA 3/25. Instructions reviewed again this morning  Verbalized understanding: Yes  Skin care/Wound care: NOne  Pain: Denies  IV's None   Tubes/Drains: None  Safety instructions: 24/7 supervision  Patient belongings: All belongings down with patient   Discharged to: Home with family  Discharged via: Wheelchair to care  Notes: Discharged at 1030

## 2020-11-05 NOTE — Plan of Care (Signed)
  Problem: Consults Goal: RH STROKE PATIENT EDUCATION Description: See Patient Education module for education specifics  Outcome: Completed/Met Goal: Nutrition Consult-if indicated Outcome: Completed/Met Goal: Diabetes Guidelines if Diabetic/Glucose > 140 Description: If diabetic or lab glucose is > 140 mg/dl - Initiate Diabetes/Hyperglycemia Guidelines & Document Interventions  Outcome: Completed/Met   Problem: RH BOWEL ELIMINATION Goal: RH STG MANAGE BOWEL WITH ASSISTANCE Description: STG Manage Bowel with supervision Assistance. Outcome: Completed/Met Goal: RH STG MANAGE BOWEL W/MEDICATION W/ASSISTANCE Description: STG Manage Bowel with Medication with supervision Assistance. Outcome: Completed/Met   Problem: RH BLADDER ELIMINATION Goal: RH STG MANAGE BLADDER WITH ASSISTANCE Description: STG Manage Bladder With supervision Assistance Outcome: Completed/Met Goal: RH STG MANAGE BLADDER WITH MEDICATION WITH ASSISTANCE Description: STG Manage Bladder With Medication With supervision Assistance. Outcome: Completed/Met   Problem: RH SKIN INTEGRITY Goal: RH STG MAINTAIN SKIN INTEGRITY WITH ASSISTANCE Description: STG Maintain Skin Integrity With supervision Assistance. Outcome: Completed/Met Goal: RH STG ABLE TO PERFORM INCISION/WOUND CARE W/ASSISTANCE Description: STG Able To Perform Incision/Wound Care With supervision Assistance. Outcome: Completed/Met   Problem: RH SAFETY Goal: RH STG ADHERE TO SAFETY PRECAUTIONS W/ASSISTANCE/DEVICE Description: STG Adhere to Safety Precautions With supervision Assistance/Device. Outcome: Completed/Met Goal: RH STG DECREASED RISK OF FALL WITH ASSISTANCE Description: STG Decreased Risk of Fall With supervision Assistance. Outcome: Completed/Met   Problem: RH COGNITION-NURSING Goal: RH STG USES MEMORY AIDS/STRATEGIES W/ASSIST TO PROBLEM SOLVE Description: STG Uses Memory Aids/Strategies With cues and reminders to Problem Solve. Outcome:  Completed/Met Goal: RH STG ANTICIPATES NEEDS/CALLS FOR ASSIST W/ASSIST/CUES Description: STG Anticipates Needs/Calls for Assist With Cues and reminders. Outcome: Completed/Met   Problem: RH PAIN MANAGEMENT Goal: RH STG PAIN MANAGED AT OR BELOW PT'S PAIN GOAL Description: <3 on a 0-10 pain scale. Outcome: Completed/Met   Problem: RH KNOWLEDGE DEFICIT Goal: RH STG INCREASE KNOWLEDGE OF STROKE PROPHYLAXIS Description: Patient will be able to demonstrate knowledge of medications used to prevent future strokes with educational materials and handouts provided by staff independently along with medications for HTN, HLD and HH/CMM diet Outcome: Completed/Met   Problem: Cardiac: Goal: Ability to achieve and maintain adequate cardiopulmonary perfusion will improve Description: Patient will be able to explain rationale for HH/low salt diet with exercise and daily weights to control fluid volume overload with ACE for HTN Outcome: Completed/Met

## 2020-11-05 NOTE — Progress Notes (Signed)
PROGRESS NOTE   Subjective/Complaints: Patient seen sitting up in her chair this morning.  She states she slept well overnight because she slept in her recliner.  She is ready for discharge.  She is appreciative of her care.  ROS: Denies CP, SOB, N/V/D.  Objective:   No results found. No results for input(s): WBC, HGB, HCT, PLT in the last 72 hours. No results for input(s): NA, K, CL, CO2, GLUCOSE, BUN, CREATININE, CALCIUM in the last 72 hours.  Intake/Output Summary (Last 24 hours) at 11/05/2020 0942 Last data filed at 11/05/2020 0730 Gross per 24 hour  Intake 680 ml  Output --  Net 680 ml        Physical Exam: Vital Signs Blood pressure (!) 158/71, pulse 99, temperature 97.8 F (36.6 C), resp. rate 20, height 4' 11.75" (1.518 m), weight 93.6 kg, SpO2 98 %. Constitutional: No distress . Vital signs reviewed.  Morbidly obese. HENT: Normocephalic.  Atraumatic. Eyes: EOMI. No discharge. Cardiovascular: No JVD.  RRR. Respiratory: Normal effort.  No stridor.  Bilateral clear to auscultation. GI: Non-distended.  BS +. Skin: Warm and dry.  Intact. Psych: Normal mood.  Normal behavior. Musc: Bilateral lower extreme edema, improving No tenderness in extremities. Neuro: Alert and oriented x3. Motor: 4+-5/5 throughout, unchanged  Assessment/Plan: 1. Functional deficits which require 3+ hours per day of interdisciplinary therapy in a comprehensive inpatient rehab setting.  Physiatrist is providing close team supervision and 24 hour management of active medical problems listed below.  Physiatrist and rehab team continue to assess barriers to discharge/monitor patient progress toward functional and medical goals  Care Tool:  Bathing    Body parts bathed by patient: Right arm,Left arm,Chest,Abdomen,Right upper leg,Left upper leg,Face,Front perineal area,Buttocks,Right lower leg,Left lower leg   Body parts bathed by helper:  Buttocks,Front perineal area     Bathing assist Assist Level: Supervision/Verbal cueing     Upper Body Dressing/Undressing Upper body dressing   What is the patient wearing?: Bra,Pull over shirt    Upper body assist Assist Level: Supervision/Verbal cueing    Lower Body Dressing/Undressing Lower body dressing      What is the patient wearing?: Underwear/pull up,Pants     Lower body assist Assist for lower body dressing: Supervision/Verbal cueing     Toileting Toileting    Toileting assist Assist for toileting: Independent with assistive device     Transfers Chair/bed transfer  Transfers assist     Chair/bed transfer assist level: Independent with assistive device Chair/bed transfer assistive device:  (rollator)   Locomotion Ambulation   Ambulation assist      Assist level: Independent with assistive device Assistive device: Rollator Max distance: 200'   Walk 10 feet activity   Assist     Assist level: Independent with assistive device Assistive device: Rollator   Walk 50 feet activity   Assist    Assist level: Independent with assistive device Assistive device: Rollator    Walk 150 feet activity   Assist Walk 150 feet activity did not occur: Safety/medical concerns (Standing rest break)  Assist level: Independent with assistive device Assistive device: Rollator    Walk 10 feet on uneven surface  activity  Assist Walk 10 feet on uneven surfaces activity did not occur: Safety/medical concerns   Assist level: Supervision/Verbal cueing Assistive device: Rollator   Wheelchair     Assist Will patient use wheelchair at discharge?: No             Wheelchair 50 feet with 2 turns activity    Assist            Wheelchair 150 feet activity     Assist            Medical Problem List and Plan: 1.  Decrease in endurance, fatigue, unsteady gait, difficulty following commands, intermittent confusion secondary  to encephalopathy.  DC today  Will see patient for hospital follow-up in 1 month post-discharge 2.  Antithrombotics: -DVT/anticoagulation:  Pharmaceutical: Continue Lovenox             -antiplatelet therapy: ASA 3. Chronic LBP/Pain Management:              --gets ESI by Dr. Nelva Bush  Controlled on 3/26             Monitor with increased exertion 4. Mood: LCSW to follow for evaluation and support.              -antipsychotic agents: N/A 5. Neuropsych: This patient is not fully capable of making decisions on her own behalf. 6. Skin/Wound Care: Routine pressure relief measures. 7. Fluids/Electrolytes/Nutrition: Strict I/Os. Low salt diet. 8. Fluid overload: Off lasix             --had stopped HCTZ due to frequency.               Filed Weights   11/02/20 1721 11/03/20 0634 11/04/20 0428  Weight: 95 kg 93.9 kg 93.6 kg    Controlled on 3/26 9.  Hypercarbic respiratory failure/OSA?/COPD?: H/o pulmonary nodules with cavitation due to PNA in 2007?             stopped using inhalers due to cough. Not on MDI at home.    Encourage pulmonary hygiene.  Encourage CPAP as able. Educating patient 10. Hyponatremia: Resolved  Likely due to diuresis. 11. Morbid obesity (BMI 40.25): Encourage weight loss 12. Mild AKI: Resolved 13.  Hypertension  Lisinopril 2.5 started on 3/21  Slightly labile, continue monitor ambulatory setting with further adjustments as necessary 14.  Prediabetes  Diet change to carb modified diet  Dietitian consulted, appreciate follow-up  Monitor his increase mobility 15.  Sleep disturbance  She is very falling asleep too hard, because she does not want someone to place CPAP on her face  Believes it is because of her bed  Improved in recliner  > 30 minutes spent in total in discharge planning between myself and PA regarding aforementioned, as well discussion regarding DME equipment, follow-up appointments, appropriate diet, follow-up therapies, discharge medications, discharge  recommendations, answering questions.  Please see discharge summary as well.  LOS: 10 days A FACE TO FACE EVALUATION WAS PERFORMED  Rickell Wiehe Lorie Phenix 11/05/2020, 9:42 AM

## 2020-11-08 DIAGNOSIS — R531 Weakness: Secondary | ICD-10-CM | POA: Diagnosis not present

## 2020-11-08 DIAGNOSIS — J449 Chronic obstructive pulmonary disease, unspecified: Secondary | ICD-10-CM | POA: Diagnosis not present

## 2020-11-08 DIAGNOSIS — M545 Low back pain, unspecified: Secondary | ICD-10-CM | POA: Diagnosis not present

## 2020-11-08 DIAGNOSIS — I10 Essential (primary) hypertension: Secondary | ICD-10-CM | POA: Diagnosis not present

## 2020-11-08 DIAGNOSIS — I69318 Other symptoms and signs involving cognitive functions following cerebral infarction: Secondary | ICD-10-CM | POA: Diagnosis not present

## 2020-11-08 DIAGNOSIS — M170 Bilateral primary osteoarthritis of knee: Secondary | ICD-10-CM | POA: Diagnosis not present

## 2020-11-08 DIAGNOSIS — R2681 Unsteadiness on feet: Secondary | ICD-10-CM | POA: Diagnosis not present

## 2020-11-08 DIAGNOSIS — Z87891 Personal history of nicotine dependence: Secondary | ICD-10-CM | POA: Diagnosis not present

## 2020-11-08 DIAGNOSIS — G9349 Other encephalopathy: Secondary | ICD-10-CM | POA: Diagnosis not present

## 2020-11-08 DIAGNOSIS — M859 Disorder of bone density and structure, unspecified: Secondary | ICD-10-CM | POA: Diagnosis not present

## 2020-11-09 DIAGNOSIS — I679 Cerebrovascular disease, unspecified: Secondary | ICD-10-CM | POA: Diagnosis not present

## 2020-11-09 DIAGNOSIS — I5032 Chronic diastolic (congestive) heart failure: Secondary | ICD-10-CM | POA: Diagnosis not present

## 2020-11-09 DIAGNOSIS — I10 Essential (primary) hypertension: Secondary | ICD-10-CM | POA: Diagnosis not present

## 2020-11-09 DIAGNOSIS — J449 Chronic obstructive pulmonary disease, unspecified: Secondary | ICD-10-CM | POA: Diagnosis not present

## 2020-11-09 DIAGNOSIS — E7800 Pure hypercholesterolemia, unspecified: Secondary | ICD-10-CM | POA: Diagnosis not present

## 2020-11-15 DIAGNOSIS — M545 Low back pain, unspecified: Secondary | ICD-10-CM | POA: Diagnosis not present

## 2020-11-15 DIAGNOSIS — M170 Bilateral primary osteoarthritis of knee: Secondary | ICD-10-CM | POA: Diagnosis not present

## 2020-11-15 DIAGNOSIS — I69318 Other symptoms and signs involving cognitive functions following cerebral infarction: Secondary | ICD-10-CM | POA: Diagnosis not present

## 2020-11-15 DIAGNOSIS — G9349 Other encephalopathy: Secondary | ICD-10-CM | POA: Diagnosis not present

## 2020-11-15 DIAGNOSIS — J449 Chronic obstructive pulmonary disease, unspecified: Secondary | ICD-10-CM | POA: Diagnosis not present

## 2020-11-15 DIAGNOSIS — R531 Weakness: Secondary | ICD-10-CM | POA: Diagnosis not present

## 2020-11-17 DIAGNOSIS — R531 Weakness: Secondary | ICD-10-CM | POA: Diagnosis not present

## 2020-11-17 DIAGNOSIS — M545 Low back pain, unspecified: Secondary | ICD-10-CM | POA: Diagnosis not present

## 2020-11-17 DIAGNOSIS — J449 Chronic obstructive pulmonary disease, unspecified: Secondary | ICD-10-CM | POA: Diagnosis not present

## 2020-11-17 DIAGNOSIS — G9349 Other encephalopathy: Secondary | ICD-10-CM | POA: Diagnosis not present

## 2020-11-17 DIAGNOSIS — I69318 Other symptoms and signs involving cognitive functions following cerebral infarction: Secondary | ICD-10-CM | POA: Diagnosis not present

## 2020-11-17 DIAGNOSIS — M170 Bilateral primary osteoarthritis of knee: Secondary | ICD-10-CM | POA: Diagnosis not present

## 2020-11-21 DIAGNOSIS — Z961 Presence of intraocular lens: Secondary | ICD-10-CM | POA: Diagnosis not present

## 2020-11-23 DIAGNOSIS — R531 Weakness: Secondary | ICD-10-CM | POA: Diagnosis not present

## 2020-11-23 DIAGNOSIS — G9349 Other encephalopathy: Secondary | ICD-10-CM | POA: Diagnosis not present

## 2020-11-23 DIAGNOSIS — M545 Low back pain, unspecified: Secondary | ICD-10-CM | POA: Diagnosis not present

## 2020-11-23 DIAGNOSIS — M170 Bilateral primary osteoarthritis of knee: Secondary | ICD-10-CM | POA: Diagnosis not present

## 2020-11-23 DIAGNOSIS — J449 Chronic obstructive pulmonary disease, unspecified: Secondary | ICD-10-CM | POA: Diagnosis not present

## 2020-11-23 DIAGNOSIS — I69318 Other symptoms and signs involving cognitive functions following cerebral infarction: Secondary | ICD-10-CM | POA: Diagnosis not present

## 2020-11-29 DIAGNOSIS — M170 Bilateral primary osteoarthritis of knee: Secondary | ICD-10-CM | POA: Diagnosis not present

## 2020-11-29 DIAGNOSIS — M545 Low back pain, unspecified: Secondary | ICD-10-CM | POA: Diagnosis not present

## 2020-11-29 DIAGNOSIS — G9349 Other encephalopathy: Secondary | ICD-10-CM | POA: Diagnosis not present

## 2020-11-29 DIAGNOSIS — J449 Chronic obstructive pulmonary disease, unspecified: Secondary | ICD-10-CM | POA: Diagnosis not present

## 2020-11-29 DIAGNOSIS — I69318 Other symptoms and signs involving cognitive functions following cerebral infarction: Secondary | ICD-10-CM | POA: Diagnosis not present

## 2020-11-29 DIAGNOSIS — R531 Weakness: Secondary | ICD-10-CM | POA: Diagnosis not present

## 2020-12-06 DIAGNOSIS — M545 Low back pain, unspecified: Secondary | ICD-10-CM | POA: Diagnosis not present

## 2020-12-06 DIAGNOSIS — M170 Bilateral primary osteoarthritis of knee: Secondary | ICD-10-CM | POA: Diagnosis not present

## 2020-12-06 DIAGNOSIS — R531 Weakness: Secondary | ICD-10-CM | POA: Diagnosis not present

## 2020-12-06 DIAGNOSIS — J449 Chronic obstructive pulmonary disease, unspecified: Secondary | ICD-10-CM | POA: Diagnosis not present

## 2020-12-06 DIAGNOSIS — G9349 Other encephalopathy: Secondary | ICD-10-CM | POA: Diagnosis not present

## 2020-12-06 DIAGNOSIS — I69318 Other symptoms and signs involving cognitive functions following cerebral infarction: Secondary | ICD-10-CM | POA: Diagnosis not present

## 2020-12-12 ENCOUNTER — Other Ambulatory Visit: Payer: Self-pay

## 2020-12-12 ENCOUNTER — Encounter: Payer: Self-pay | Admitting: Physical Medicine & Rehabilitation

## 2020-12-12 ENCOUNTER — Encounter: Payer: Medicare Other | Attending: Physical Medicine & Rehabilitation | Admitting: Physical Medicine & Rehabilitation

## 2020-12-12 VITALS — BP 134/71 | HR 100 | Temp 98.2°F | Ht 59.75 in | Wt 197.4 lb

## 2020-12-12 DIAGNOSIS — J42 Unspecified chronic bronchitis: Secondary | ICD-10-CM

## 2020-12-12 DIAGNOSIS — I639 Cerebral infarction, unspecified: Secondary | ICD-10-CM | POA: Diagnosis not present

## 2020-12-12 DIAGNOSIS — Z9981 Dependence on supplemental oxygen: Secondary | ICD-10-CM

## 2020-12-12 DIAGNOSIS — G9340 Encephalopathy, unspecified: Secondary | ICD-10-CM

## 2020-12-12 DIAGNOSIS — I10 Essential (primary) hypertension: Secondary | ICD-10-CM | POA: Diagnosis not present

## 2020-12-12 DIAGNOSIS — J9601 Acute respiratory failure with hypoxia: Secondary | ICD-10-CM | POA: Diagnosis not present

## 2020-12-12 NOTE — Progress Notes (Signed)
Subjective:    Patient ID: [name], female    DOB: 16-Mar-1933, 87 y.o.   MRN: [id number]  HPI Female with history of HTN, COPD, OA, chronic back pain presents for hospital follow up for encephalopathy.    At discharge, she was instructed to follow up with PCP, which she did. BP is relatively controlled. She states her back pain is coming back. She has not had a sleep study. She states she is losing weight. BP is relatively controlled. Sleep is fair. Denies.  Therapies: Released DME: Walker Mobility: Rolator in community   Pain Inventory Average Pain 3 Pain Right Now 0 My pain is intermittent and aching  LOCATION OF PAIN  Lower back and buttock on both sides BOWEL Number of stools per week: 7 Oral laxative use Yes  Type of laxative Ducosate Enema or suppository use No  History of colostomy No  Incontinent No   BLADDER Normal In and out cath, frequency n/a Able to self cath No  Bladder incontinence No  Frequent urination No  Leakage with coughing No  Difficulty starting stream No  Incomplete bladder emptying No    Mobility walk with assistance use a walker how many minutes can you walk? Unknown ability to climb steps?  yes do you drive?  yes Do you have any goals in this area?  yes  Function retired  Neuro/Psych bladder control problems trouble walking  Prior Studies Any changes since last visit?  no New Patient  Physicians involved in your care Any changes since last visit?  no New Patient   Family History  Problem Relation Age of Onset  . CAD Father   . Breast cancer Neg Hx    Social History   Socioeconomic History  . Marital status: Widowed    Spouse name: Not on file  . Number of children: Not on file  . Years of education: Not on file  . Highest education level: Not on file  Occupational History  . Not on file  Tobacco Use  . Smoking status: Former Research scientist (life sciences)  . Smokeless tobacco: Never Used  Vaping Use  . Vaping Use: Never used   Substance and Sexual Activity  . Alcohol use: Not Currently  . Drug use: Never  . Sexual activity: Not on file  Other Topics Concern  . Not on file  Social History Narrative  . Not on file   Social Determinants of Health   Financial Resource Strain: Not on file  Food Insecurity: Not on file  Transportation Needs: Not on file  Physical Activity: Not on file  Stress: Not on file  Social Connections: Not on file   Past Surgical History:  Procedure Laterality Date  . BREAST EXCISIONAL BIOPSY Right    benign   Past Medical History:  Diagnosis Date  . Colon polyp   . COPD (chronic obstructive pulmonary disease) (HCC)    MILD  . Hyperlipidemia   . Hypertension   . OA (osteoarthritis)    OF THE KNEES  . Osteopenia   . Shingles   . Thyroid cyst    BP 134/71   Pulse 100   Temp 98.2 F (36.8 C)   Ht 4' 11.75" (1.518 m)   Wt 197 lb 6.4 oz (89.5 kg)   SpO2 93%   BMI 38.88 kg/m   Opioid Risk Score:   Fall Risk Score:  `1  Depression screen PHQ 2/9  Depression screen PHQ 2/9 12/12/2020  Decreased Interest 0  Down, Depressed, Hopeless  0  PHQ - 2 Score 0  Altered sleeping 0  Tired, decreased energy 0  Change in appetite 0  Feeling bad or failure about yourself  0  Trouble concentrating 0  Moving slowly or fidgety/restless 0  Suicidal thoughts 0  PHQ-9 Score 0   Review of Systems  Genitourinary: Positive for frequency.  Musculoskeletal: Positive for gait problem.  All other systems reviewed and are negative.      Objective:   Physical Exam  Constitutional: No distress . Vital signs reviewed. HENT: Normocephalic.  Atraumatic. Eyes: EOMI. No discharge. Cardiovascular: No JVD.   Respiratory: Normal effort.  No stridor.   GI: Non-distended.   Skin: Warm and dry.  Intact. Psych: Normal mood.  Normal behavior. Musc: Bilateral lower extreme edema, unchanged No tenderness in extremities. Neuro: Alert and oriented. Motor: Grossly intact    Assessment & Plan:   Female with history of HTN, COPD, OA, chronic back pain presents for follow up for encephalopathy.    1.  Decrease in endurance, fatigue, unsteady gait, difficulty following commands, intermittent confusion secondary to encephalopathy.  Cont HEP  Improving  2. Chronic LBP/Pain Management:              Follows up with Dr. Nelva Bush  3.  Hypercarbic respiratory failure/OSA?/COPD?:   Following up for sleep study.  4. Morbid obesity (BMI 40.25):   Encouraged weight loss, losing weight  5.  Hypertension  Cont meds  Controlled at present  6. Gait abnormality  Cont rolator for safety  Cont HEP

## 2020-12-13 ENCOUNTER — Other Ambulatory Visit: Payer: Self-pay | Admitting: *Deleted

## 2020-12-16 DIAGNOSIS — J449 Chronic obstructive pulmonary disease, unspecified: Secondary | ICD-10-CM | POA: Diagnosis not present

## 2020-12-16 DIAGNOSIS — I679 Cerebrovascular disease, unspecified: Secondary | ICD-10-CM | POA: Diagnosis not present

## 2020-12-16 DIAGNOSIS — E7800 Pure hypercholesterolemia, unspecified: Secondary | ICD-10-CM | POA: Diagnosis not present

## 2020-12-16 DIAGNOSIS — I10 Essential (primary) hypertension: Secondary | ICD-10-CM | POA: Diagnosis not present

## 2021-01-20 DIAGNOSIS — J441 Chronic obstructive pulmonary disease with (acute) exacerbation: Secondary | ICD-10-CM | POA: Diagnosis not present

## 2021-01-20 DIAGNOSIS — I10 Essential (primary) hypertension: Secondary | ICD-10-CM | POA: Diagnosis not present

## 2021-01-20 DIAGNOSIS — E7800 Pure hypercholesterolemia, unspecified: Secondary | ICD-10-CM | POA: Diagnosis not present

## 2021-01-20 DIAGNOSIS — I679 Cerebrovascular disease, unspecified: Secondary | ICD-10-CM | POA: Diagnosis not present

## 2021-01-25 ENCOUNTER — Encounter: Payer: Self-pay | Admitting: Neurology

## 2021-01-25 ENCOUNTER — Ambulatory Visit (INDEPENDENT_AMBULATORY_CARE_PROVIDER_SITE_OTHER): Payer: Medicare Other | Admitting: Neurology

## 2021-01-25 VITALS — BP 159/72 | HR 95 | Ht 59.75 in | Wt 193.6 lb

## 2021-01-25 DIAGNOSIS — I6381 Other cerebral infarction due to occlusion or stenosis of small artery: Secondary | ICD-10-CM

## 2021-01-25 DIAGNOSIS — G5601 Carpal tunnel syndrome, right upper limb: Secondary | ICD-10-CM | POA: Diagnosis not present

## 2021-01-25 NOTE — Patient Instructions (Signed)
I had a long d/w patient about her recent silent lacunar stroke, risk for recurrent stroke/TIAs, personally independently reviewed imaging studies and stroke evaluation results and answered questions.Continue aspirin 81 mg daily  for secondary stroke prevention and maintain strict control of hypertension with blood pressure goal below 130/90, diabetes with hemoglobin A1c goal below 6.5% and lipids with LDL cholesterol goal below 70 mg/dL. I also advised the patient to eat a healthy diet with plenty of whole grains, cereals, fruits and vegetables, exercise regularly and maintain ideal body weight .she was encouraged to use a wheeled walker.  Followup in the future with my nurse practitioner [name] in 6 months or call earlier if necessary. Stroke Prevention Some medical conditions and behaviors are associated with a higher chance of having a stroke. You can help prevent a stroke by making nutrition, lifestyle,and other changes, including managing any medical conditions you may have. What nutrition changes can be made?  Eat healthy foods. You can do this by: Choosing foods high in fiber, such as fresh fruits and vegetables and whole grains. Eating at least 5 or more servings of fruits and vegetables a day. Try to fill half of your plate at each meal with fruits and vegetables. Choosing lean protein foods, such as lean cuts of meat, poultry without skin, fish, tofu, beans, and nuts. Eating low-fat dairy products. Avoiding foods that are high in salt (sodium). This can help lower blood pressure. Avoiding foods that have saturated fat, trans fat, and cholesterol. This can help prevent high cholesterol. Avoiding processed and premade foods. Follow your health care provider's specific guidelines for losing weight, controlling high blood pressure (hypertension), lowering high cholesterol, and managing diabetes. These may include: Reducing your daily calorie intake. Limiting your daily sodium intake to 1,500  milligrams (mg). Using only healthy fats for cooking, such as olive oil, canola oil, or sunflower oil. Counting your daily carbohydrate intake. What lifestyle changes can be made? Maintain a healthy weight. Talk to your health care provider about your ideal weight. Get at least 30 minutes of moderate physical activity at least 5 days a week. Moderate activity includes brisk walking, biking, and swimming. Do not use any products that contain nicotine or tobacco, such as cigarettes and e-cigarettes. If you need help quitting, ask your health care provider. It may also be helpful to avoid exposure to secondhand smoke. Limit alcohol intake to no more than 1 drink a day for nonpregnant women and 2 drinks a day for men. One drink equals 12 oz of beer, 5 oz of wine, or 1 oz of hard liquor. Stop any illegal drug use. Avoid taking birth control pills. Talk to your health care provider about the risks of taking birth control pills if: You are over 64 years old. You smoke. You get migraines. You have ever had a blood clot. What other changes can be made? Manage your cholesterol levels. Eating a healthy diet is important for preventing high cholesterol. If cholesterol cannot be managed through diet alone, you may also need to take medicines. Take any prescribed medicines to control your cholesterol as told by your health care provider. Manage your diabetes. Eating a healthy diet and exercising regularly are important parts of managing your blood sugar. If your blood sugar cannot be managed through diet and exercise, you may need to take medicines. Take any prescribed medicines to control your diabetes as told by your health care provider. Control your hypertension. To reduce your risk of stroke, try to keep your blood  pressure below 130/80. Eating a healthy diet and exercising regularly are an important part of controlling your blood pressure. If your blood pressure cannot be managed through diet and  exercise, you may need to take medicines. Take any prescribed medicines to control hypertension as told by your health care provider. Ask your health care provider if you should monitor your blood pressure at home. Have your blood pressure checked every year, even if your blood pressure is normal. Blood pressure increases with age and some medical conditions. Get evaluated for sleep disorders (sleep apnea). Talk to your health care provider about getting a sleep evaluation if you snore a lot or have excessive sleepiness. Take over-the-counter and prescription medicines only as told by your health care provider. Aspirin or blood thinners (antiplatelets or anticoagulants) may be recommended to reduce your risk of forming blood clots that can lead to stroke. Make sure that any other medical conditions you have, such as atrial fibrillation or atherosclerosis, are managed. What are the warning signs of a stroke? The warning signs of a stroke can be easily remembered as BEFAST. B is for balance. Signs include: Dizziness. Loss of balance or coordination. Sudden trouble walking. E is for eyes. Signs include: A sudden change in vision. Trouble seeing. F is for face. Signs include: Sudden weakness or numbness of the face. The face or eyelid drooping to one side. A is for arms. Signs include: Sudden weakness or numbness of the arm, usually on one side of the body. S is for speech. Signs include: Trouble speaking (aphasia). Trouble understanding. T is for time. These symptoms may represent a serious problem that is an emergency. Do not wait to see if the symptoms will go away. Get medical help right away. Call your local emergency services (911 in the [place] Do not drive yourself to the hospital. Other signs of stroke may include: A sudden, severe headache with no known cause. Nausea or vomiting. Seizure. Where to find more information For more information, visit: American Stroke Association:  www.strokeassociation.org National Stroke Association: www.stroke.org Summary You can prevent a stroke by eating healthy, exercising, not smoking, limiting alcohol intake, and managing any medical conditions you may have. Do not use any products that contain nicotine or tobacco, such as cigarettes and e-cigarettes. If you need help quitting, ask your health care provider. It may also be helpful to avoid exposure to secondhand smoke. Remember BEFAST for warning signs of stroke. Get help right away if you or a loved one has any of these signs. This information is not intended to replace advice given to you by your health care provider. Make sure you discuss any questions you have with your healthcare provider. Document Revised: 07/12/2017 Document Reviewed: 09/04/2016 Elsevier Patient Education  2021 [employer].

## 2021-01-25 NOTE — Progress Notes (Signed)
[facility] [address] [place]. [place] [address] [phone]       OFFICE FOLLOW-UP NOTE  [name] Date of Birth:  November 25, 1932 Medical Record Number:  [id number]   HPI: [name] is a pleasant 87 year old African-American lady seen today for initial office follow-up visit after hospital consultation for stroke in March 2022.  History is obtained from patient and review of electronic medical records and personally reviewed pertinent imaging films in PACS.  She has past medical history of hypertension, hyperlipidemia, obesity, COPD who presented with 1 and half weeks of shortness of breath and leg swelling on 10/23/2020.  Primary care physician had stopped hydrochlorothiazide due to increasing leg swellings.  Patient developed respiratory distress and was intubated.  She developed some slurred speech and involuntary rhythmic movements of right greater than left upper extremity and some periods of confusion and disorientation possibly from hypoxia.  MRI scan of the brain was obtained which showed tiny punctate left frontal white matter infarct which is felt to be clinically silent and incidental.  Carotid ultrasound showed 40 to 59% left ICA and 1-39% right ICA stenosis.  2D echo showed ejection fraction 65 to 70%.  LDL cholesterol was elevated at 105 mg percent hemoglobin A1c was borderline at 5.9.  EEG showed diffuse generalized slowing without epileptiform activity.  Patient was started on aspirin for stroke prevention.  She states she is doing well since discharge exam no recurrent stroke or TIA symptoms.  She is tolerating aspirin well without bruising or bleeding.  Her blood pressure at home is quite good controlled but today it is elevated in office at 159/72.  Is tolerating Crestor well without muscle aches and pains.  She does use a wheeled walker for ambulation but no further neurological issues.  There is no prior history of strokes TIA ,seizures, migraines or  neurological problems. ROS:   14 system review of systems is positive for cough, shortness of breath, leg swelling, gait imbalance, twitchings, slurred speech all other systems negative  PMH:  Past Medical History:  Diagnosis Date   Colon polyp    COPD (chronic obstructive pulmonary disease) (HCC)    MILD   Hyperlipidemia    Hypertension    OA (osteoarthritis)    OF THE KNEES   Osteopenia    Shingles    Stroke [facility])    Thyroid cyst     Social History:  Social History   Socioeconomic History   Marital status: Widowed    Spouse name: Not on file   Number of children: Not on file   Years of education: Not on file   Highest education level: Not on file  Occupational History   Not on file  Tobacco Use   Smoking status: Former    Pack years: 0.00   Smokeless tobacco: Never  Vaping Use   Vaping Use: Never used  Substance and Sexual Activity   Alcohol use: Not Currently   Drug use: Never   Sexual activity: Not on file  Other Topics Concern   Not on file  Social History Narrative   Lives alone   Right Handed   Drinks decaf   Social Determinants of Health   Financial Resource Strain: Not on file  Food Insecurity: Not on file  Transportation Needs: Not on file  Physical Activity: Not on file  Stress: Not on file  Social Connections: Not on file  Intimate Partner Violence: Not on file    Medications:   Current Outpatient  Medications on File Prior to Visit  Medication Sig Dispense Refill   amoxicillin (AMOXIL) 500 MG capsule 4 capsules one time     aspirin EC 81 MG EC tablet Take 1 tablet (81 mg total) by mouth daily. Swallow whole. 30 tablet 11   benzonatate (TESSALON) 200 MG capsule 1 capsule     Cholecalciferol (VITAMIN D PO) Take 1,000 Units by mouth daily.     docusate sodium (COLACE) 100 MG capsule Take 1 capsule (100 mg total) by mouth daily as needed for mild constipation. 60 capsule 0   furosemide (LASIX) 20 MG tablet 1 tablet     lisinopril (ZESTRIL)  2.5 MG tablet Take 1 tablet (2.5 mg total) by mouth daily. 30 tablet 0   rosuvastatin (CRESTOR) 5 MG tablet 1 tablet     No current facility-administered medications on file prior to visit.    Allergies:   Allergies  Allergen Reactions   Atorvastatin     Other reaction(s): soreness   Avelox [Moxifloxacin]     Other reaction(s): itching   Ceftin [Cefuroxime] Diarrhea   Cefuroxime Axetil     Other reaction(s): severe diarrhea   Clindamycin Hcl     Other reaction(s): itching   Clindamycin/Lincomycin Itching   Simvastatin     Muscle pain with large dose Other reaction(s): myalgia with higher doses   Doxycycline Hyclate Diarrhea and Rash    Other reaction(s): rash, diarrhea   Penicillins Rash    Physical Exam General: obese elderly african american lady, seated, in no evident distress Head: head normocephalic and atraumatic.  Neck: supple with no carotid or supraclavicular bruits Cardiovascular: regular rate and rhythm, no murmurs Musculoskeletal: no deformity excepe kyphoscloliosis Skin:  no rash/petichiae Vascular:  Normal pulses all extremities Vitals:   01/25/21 1042  BP: (!) 159/72  Pulse: 95   Neurologic Exam Mental Status: Awake and fully alert. Oriented to place and time. Recent and remote memory intact. Attention span, concentration and fund of knowledge appropriate. Mood and affect appropriate.  Cranial Nerves: Fundoscopic exam reveals sharp disc margins. Pupils equal, briskly reactive to light. Extraocular movements full without nystagmus.  But has mild exotropia of the left eye visual fields full to confrontation. Hearing intact. Facial sensation intact. Face, tongue, palate moves normally and symmetrically.  Motor: Normal bulk and tone. Normal strength in all tested extremity muscles. Sensory.: intact to touch ,pinprick .position and vibratory sensation.  Coordination: Rapid alternating movements normal in all extremities. Finger-to-nose and heel-to-shin performed  accurately bilaterally. Gait and Station: Arises from chair without difficulty. Stance is stooped. Gait demonstrates slight wide base..  Not able to heel, toe and tandem walk .  Uses a wheeled walker.  Reflexes: 1+ and symmetric. Toes downgoing.   NIHSS  0 Modified Rankin  0   ASSESSMENT: 87 year old African-American lady with silent small left frontal white matter lacunar infarct from small vessel disease in March 2022 discovered during hospitalization for respiratory failure and CHF.  Vascular risk factors of hyperlipidemia, hypertension, obesity and CHF     PLAN: I had a long d/w patient about her recent silent lacunar stroke, risk for recurrent stroke/TIAs, personally independently reviewed imaging studies and stroke evaluation results and answered questions.Continue aspirin 81 mg daily  for secondary stroke prevention and maintain strict control of hypertension with blood pressure goal below 130/90, diabetes with hemoglobin A1c goal below 6.5% and lipids with LDL cholesterol goal below 70 mg/dL. I also advised the patient to eat a healthy diet with plenty of whole grains,  cereals, fruits and vegetables, exercise regularly and maintain ideal body weight .she was encouraged to use a wheeled walker.  I also advised her to wear carpal tunnel wrist extension splint on the right hand at night and to avoid activities which involve rapid repetitive wrist flexion movements.  Followup in the future with my nurse practitioner [name] in 6 months or call earlier if necessary. Greater than 50% of time during this 25 minute visit was spent on counseling,explanation of diagnosis, planning of further management, discussion with patient and family and coordination of care Antony Contras, MD Note: This document was prepared with digital dictation and possible smart phrase technology. Any transcriptional errors that result from this process are unintentional

## 2021-02-14 DIAGNOSIS — J40 Bronchitis, not specified as acute or chronic: Secondary | ICD-10-CM | POA: Diagnosis not present

## 2021-03-13 DIAGNOSIS — I10 Essential (primary) hypertension: Secondary | ICD-10-CM | POA: Diagnosis not present

## 2021-03-13 DIAGNOSIS — R053 Chronic cough: Secondary | ICD-10-CM | POA: Diagnosis not present

## 2021-03-13 DIAGNOSIS — J449 Chronic obstructive pulmonary disease, unspecified: Secondary | ICD-10-CM | POA: Diagnosis not present

## 2021-03-29 ENCOUNTER — Encounter: Payer: Self-pay | Admitting: Podiatry

## 2021-03-29 ENCOUNTER — Ambulatory Visit (INDEPENDENT_AMBULATORY_CARE_PROVIDER_SITE_OTHER): Payer: Medicare Other | Admitting: Podiatry

## 2021-03-29 ENCOUNTER — Other Ambulatory Visit: Payer: Self-pay

## 2021-03-29 DIAGNOSIS — M79675 Pain in left toe(s): Secondary | ICD-10-CM | POA: Diagnosis not present

## 2021-03-29 DIAGNOSIS — M79674 Pain in right toe(s): Secondary | ICD-10-CM

## 2021-03-29 DIAGNOSIS — B351 Tinea unguium: Secondary | ICD-10-CM

## 2021-03-29 NOTE — Progress Notes (Signed)
  Subjective:  Patient ID: [name], female    DOB: November 05, 1932,  MRN: [id number]  Chief Complaint  Patient presents with   Nail Problem    Nail trim    87 y.o. female returns for the above complaint.  Patient presents with thickened elongated dystrophic toenails x10.  Mild pain on palpation.  Patient would like to debride them down.  She denies doing it herself.  She is not able to do her self.  Pain with ambulation.  Objective:  There were no vitals filed for this visit. Podiatric Exam: Vascular: dorsalis pedis and posterior tibial pulses are palpable bilateral. Capillary return is immediate. Temperature gradient is WNL. Skin turgor WNL  Sensorium: Normal Semmes Weinstein monofilament test. Normal tactile sensation bilaterally. Nail Exam: Pt has thick disfigured discolored nails with subungual debris noted bilateral entire nail hallux through fifth toenails.  Pain on palpation to the nails. Ulcer Exam: There is no evidence of ulcer or pre-ulcerative changes or infection. Orthopedic Exam: Muscle tone and strength are WNL. No limitations in general ROM. No crepitus or effusions noted. HAV  B/L.  Hammer toes 2-5  B/L. Skin: No Porokeratosis. No infection or ulcers    Assessment & Plan:   1. Pain due to onychomycosis of toenails of both feet     Patient was evaluated and treated and all questions answered.  Onychomycosis with pain  -Nails palliatively debrided as below. -Educated on self-care  Procedure: Nail Debridement Rationale: pain  Type of Debridement: manual, sharp debridement. Instrumentation: Nail nipper, rotary burr. Number of Nails: 10  Procedures and Treatment: Consent by patient was obtained for treatment procedures. The patient understood the discussion of treatment and procedures well. All questions were answered thoroughly reviewed. Debridement of mycotic and hypertrophic toenails, 1 through 5 bilateral and clearing of subungual debris. No ulceration, no  infection noted.  Return Visit-Office Procedure: Patient instructed to return to the office for a follow up visit 3 months for continued evaluation and treatment.  Boneta Lucks, DPM    Return in about 3 months (around 06/29/2021).

## 2021-05-04 DIAGNOSIS — M170 Bilateral primary osteoarthritis of knee: Secondary | ICD-10-CM | POA: Diagnosis not present

## 2021-05-04 DIAGNOSIS — I5032 Chronic diastolic (congestive) heart failure: Secondary | ICD-10-CM | POA: Diagnosis not present

## 2021-05-04 DIAGNOSIS — J449 Chronic obstructive pulmonary disease, unspecified: Secondary | ICD-10-CM | POA: Diagnosis not present

## 2021-05-04 DIAGNOSIS — E7800 Pure hypercholesterolemia, unspecified: Secondary | ICD-10-CM | POA: Diagnosis not present

## 2021-05-04 DIAGNOSIS — M1990 Unspecified osteoarthritis, unspecified site: Secondary | ICD-10-CM | POA: Diagnosis not present

## 2021-05-04 DIAGNOSIS — J441 Chronic obstructive pulmonary disease with (acute) exacerbation: Secondary | ICD-10-CM | POA: Diagnosis not present

## 2021-05-04 DIAGNOSIS — I10 Essential (primary) hypertension: Secondary | ICD-10-CM | POA: Diagnosis not present

## 2021-06-01 ENCOUNTER — Other Ambulatory Visit: Payer: Self-pay | Admitting: Family Medicine

## 2021-06-01 DIAGNOSIS — Z23 Encounter for immunization: Secondary | ICD-10-CM | POA: Diagnosis not present

## 2021-06-01 DIAGNOSIS — Z1231 Encounter for screening mammogram for malignant neoplasm of breast: Secondary | ICD-10-CM

## 2021-06-08 DIAGNOSIS — Z23 Encounter for immunization: Secondary | ICD-10-CM | POA: Diagnosis not present

## 2021-07-05 ENCOUNTER — Ambulatory Visit (INDEPENDENT_AMBULATORY_CARE_PROVIDER_SITE_OTHER): Payer: Medicare Other | Admitting: Podiatry

## 2021-07-05 ENCOUNTER — Other Ambulatory Visit: Payer: Self-pay

## 2021-07-05 ENCOUNTER — Encounter: Payer: Self-pay | Admitting: Podiatry

## 2021-07-05 DIAGNOSIS — G5620 Lesion of ulnar nerve, unspecified upper limb: Secondary | ICD-10-CM | POA: Insufficient documentation

## 2021-07-05 DIAGNOSIS — Z6837 Body mass index (BMI) 37.0-37.9, adult: Secondary | ICD-10-CM | POA: Insufficient documentation

## 2021-07-05 DIAGNOSIS — R32 Unspecified urinary incontinence: Secondary | ICD-10-CM | POA: Insufficient documentation

## 2021-07-05 DIAGNOSIS — J309 Allergic rhinitis, unspecified: Secondary | ICD-10-CM | POA: Insufficient documentation

## 2021-07-05 DIAGNOSIS — M5430 Sciatica, unspecified side: Secondary | ICD-10-CM | POA: Insufficient documentation

## 2021-07-05 DIAGNOSIS — B351 Tinea unguium: Secondary | ICD-10-CM | POA: Diagnosis not present

## 2021-07-05 DIAGNOSIS — I5032 Chronic diastolic (congestive) heart failure: Secondary | ICD-10-CM | POA: Insufficient documentation

## 2021-07-05 DIAGNOSIS — Z860100 Personal history of colon polyps, unspecified: Secondary | ICD-10-CM | POA: Insufficient documentation

## 2021-07-05 DIAGNOSIS — M79675 Pain in left toe(s): Secondary | ICD-10-CM

## 2021-07-05 DIAGNOSIS — M79674 Pain in right toe(s): Secondary | ICD-10-CM | POA: Diagnosis not present

## 2021-07-05 DIAGNOSIS — N3281 Overactive bladder: Secondary | ICD-10-CM | POA: Insufficient documentation

## 2021-07-05 DIAGNOSIS — R151 Fecal smearing: Secondary | ICD-10-CM | POA: Insufficient documentation

## 2021-07-05 DIAGNOSIS — E7800 Pure hypercholesterolemia, unspecified: Secondary | ICD-10-CM | POA: Insufficient documentation

## 2021-07-05 DIAGNOSIS — I5030 Unspecified diastolic (congestive) heart failure: Secondary | ICD-10-CM | POA: Insufficient documentation

## 2021-07-05 DIAGNOSIS — I499 Cardiac arrhythmia, unspecified: Secondary | ICD-10-CM | POA: Insufficient documentation

## 2021-07-05 DIAGNOSIS — M179 Osteoarthritis of knee, unspecified: Secondary | ICD-10-CM | POA: Insufficient documentation

## 2021-07-08 ENCOUNTER — Encounter: Payer: Self-pay | Admitting: Podiatry

## 2021-07-08 NOTE — Progress Notes (Signed)
  Subjective:  Patient ID: [name], female    DOB: Nov 04, 1932,  MRN: [id number]  [name] presents to clinic today for painful elongated mycotic toenails 1-5 bilaterally which are tender when wearing enclosed shoe gear. Pain is relieved with periodic professional debridement.  She notes no new pedal problems on today's visit.  PCP is Shirline Frees, MD , and last visit was 06/08/2021.  Allergies  Allergen Reactions   Atorvastatin     Other reaction(s): soreness   Ceftin [Cefuroxime] Diarrhea   Cefuroxime Axetil     Other reaction(s): severe diarrhea   Clindamycin Hcl     Other reaction(s): itching   Clindamycin/Lincomycin Itching   Moxifloxacin     Other reaction(s): itching Other reaction(s): itching   Simvastatin     Muscle pain with large dose Other reaction(s): myalgia with higher doses   Doxycycline Hyclate Diarrhea and Rash    Other reaction(s): rash, diarrhea   Penicillins Rash    Review of Systems: Negative except as noted in the HPI. Objective:   Constitutional [name] is a pleasant 87 y.o. African American female, in NAD. AAO x 3.   Vascular CFT immediate b/l LE. Palpable DP/PT pulses b/l LE. Digital hair present b/l. Skin temperature gradient WNL b/l. No pain with calf compression b/l. Trace edema b/l LE. No cyanosis or clubbing noted b/l LE.  Neurologic Normal speech. Oriented to person, place, and time. Protective sensation intact 5/5 intact bilaterally with 10g monofilament b/l. Vibratory sensation intact b/l.  Dermatologic Pedal integument with normal turgor, texture and tone b/l LE. No open wounds b/l. No interdigital macerations b/l. Toenails 1-5 b/l elongated, thickened, discolored with subungual debris. +Tenderness with dorsal palpation of nailplates. No hyperkeratotic or porokeratotic lesions present.  Orthopedic: Muscle strength 5/5 to all lower extremity muscle groups bilaterally. HAV with bunion deformity noted b/l LE. Hammertoe deformity  noted 2-5 b/l. Utilizes motorized chair for mobility assistance.   Radiographs: None  Last A1c:  Hemoglobin A1C Latest Ref Rng & Units 10/24/2020  HGBA1C 4.8 - 5.6 % 5.9(H)  Some recent data might be hidden   Assessment:   1. Pain due to onychomycosis of toenails of both feet    Plan:  Patient was evaluated and treated and all questions answered. Consent given for treatment as described below: -Examined patient. -No new findings. No new orders. -Mycotic toenails 1-5 bilaterally were debrided in length and girth with sterile nail nippers and dremel without incident. -Patient/POA to call should there be question/concern in the interim.  Return in about 3 months (around 10/05/2021).  Marzetta Board, DPM

## 2021-07-12 ENCOUNTER — Other Ambulatory Visit: Payer: Self-pay | Admitting: Family Medicine

## 2021-07-12 DIAGNOSIS — M5136 Other intervertebral disc degeneration, lumbar region: Secondary | ICD-10-CM | POA: Diagnosis not present

## 2021-07-12 DIAGNOSIS — Z0000 Encounter for general adult medical examination without abnormal findings: Secondary | ICD-10-CM | POA: Diagnosis not present

## 2021-07-12 DIAGNOSIS — N644 Mastodynia: Secondary | ICD-10-CM

## 2021-07-12 DIAGNOSIS — I679 Cerebrovascular disease, unspecified: Secondary | ICD-10-CM | POA: Diagnosis not present

## 2021-07-12 DIAGNOSIS — E7800 Pure hypercholesterolemia, unspecified: Secondary | ICD-10-CM | POA: Diagnosis not present

## 2021-07-12 DIAGNOSIS — M47896 Other spondylosis, lumbar region: Secondary | ICD-10-CM | POA: Diagnosis not present

## 2021-07-12 DIAGNOSIS — M47816 Spondylosis without myelopathy or radiculopathy, lumbar region: Secondary | ICD-10-CM | POA: Diagnosis not present

## 2021-07-12 DIAGNOSIS — I10 Essential (primary) hypertension: Secondary | ICD-10-CM | POA: Diagnosis not present

## 2021-07-14 ENCOUNTER — Ambulatory Visit: Payer: Medicare Other

## 2021-07-20 ENCOUNTER — Telehealth: Payer: Self-pay | Admitting: Adult Health

## 2021-07-20 NOTE — Telephone Encounter (Signed)
Patient is concerned about getting an injection for back pain tomorrow, She is concerned she has new symptoms and wanted to speak to [name] to see if she should go for the injection or wait until after the visit. Please call her today if possible she will explain in detail in regards to the symptoms and medication change. Thank you

## 2021-07-20 NOTE — Telephone Encounter (Signed)
Spoke to pt, informed her that it is fine to have injection this week and be seen by [name] NP next week. Pt appreciated call.

## 2021-07-21 DIAGNOSIS — M47816 Spondylosis without myelopathy or radiculopathy, lumbar region: Secondary | ICD-10-CM | POA: Diagnosis not present

## 2021-07-21 DIAGNOSIS — M47896 Other spondylosis, lumbar region: Secondary | ICD-10-CM | POA: Diagnosis not present

## 2021-07-24 ENCOUNTER — Ambulatory Visit
Admission: RE | Admit: 2021-07-24 | Discharge: 2021-07-24 | Disposition: A | Discharge status: Home / Self Care | Payer: Medicare Other | Source: Ambulatory Visit | Attending: Family Medicine | Admitting: Family Medicine

## 2021-07-24 DIAGNOSIS — R922 Inconclusive mammogram: Secondary | ICD-10-CM | POA: Diagnosis not present

## 2021-07-24 DIAGNOSIS — N644 Mastodynia: Secondary | ICD-10-CM

## 2021-07-24 IMAGING — MG DIGITAL DIAGNOSTIC BILAT W/ TOMO W/ CAD
8 of 14 series · 8 of 40 positions shown · non-contrast
Comparison: Previous exam(s).

CLINICAL DATA: Patient describes uncomfortable sensation within the
inner LEFT breast, intermittent, once or twice per week.

EXAM:
DIGITAL DIAGNOSTIC BILATERAL MAMMOGRAM WITH TOMOSYNTHESIS AND CAD;
ULTRASOUND LEFT BREAST LIMITED
TECHNIQUE: Bilateral digital diagnostic mammography and breast tomosynthesis
was performed. The images were evaluated with computer-aided
detection.; Targeted ultrasound examination of the left breast was
performed.

[R MLO synth-2D (1 of 2)]
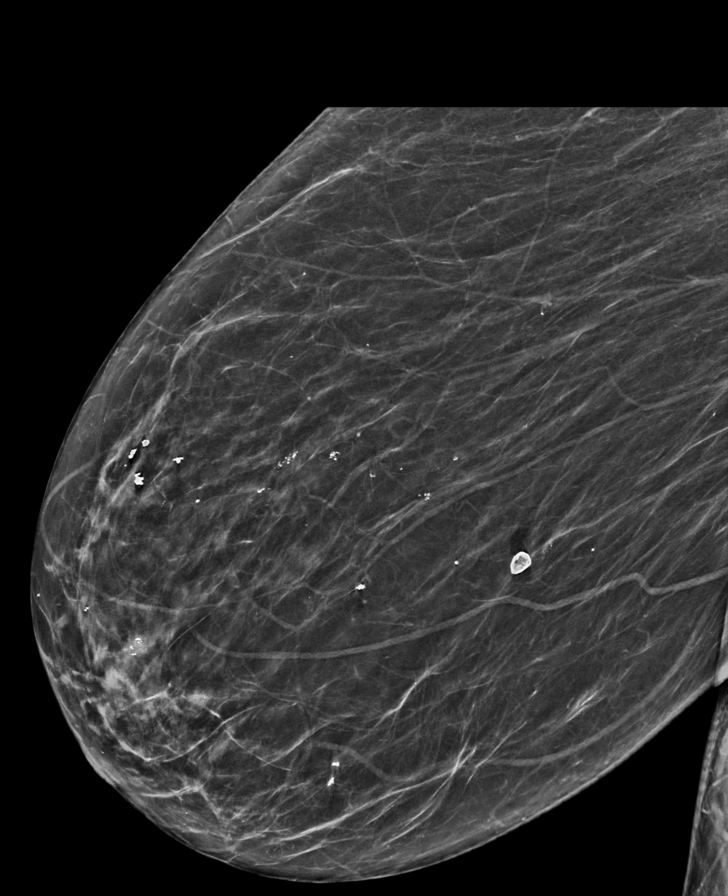

[R MLO synth-2D (2 of 2)]
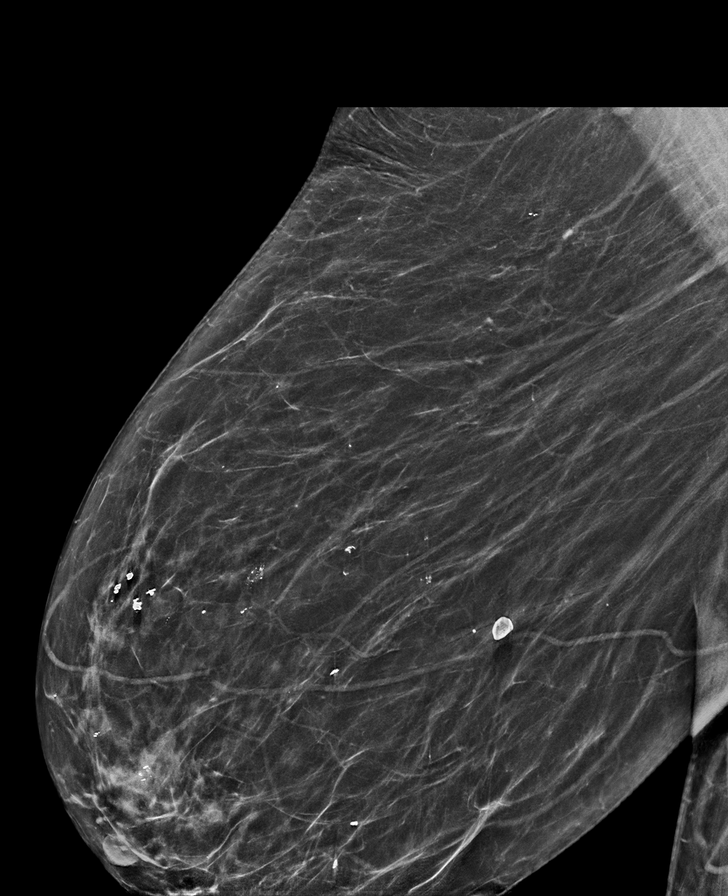

[R CC synth-2D]
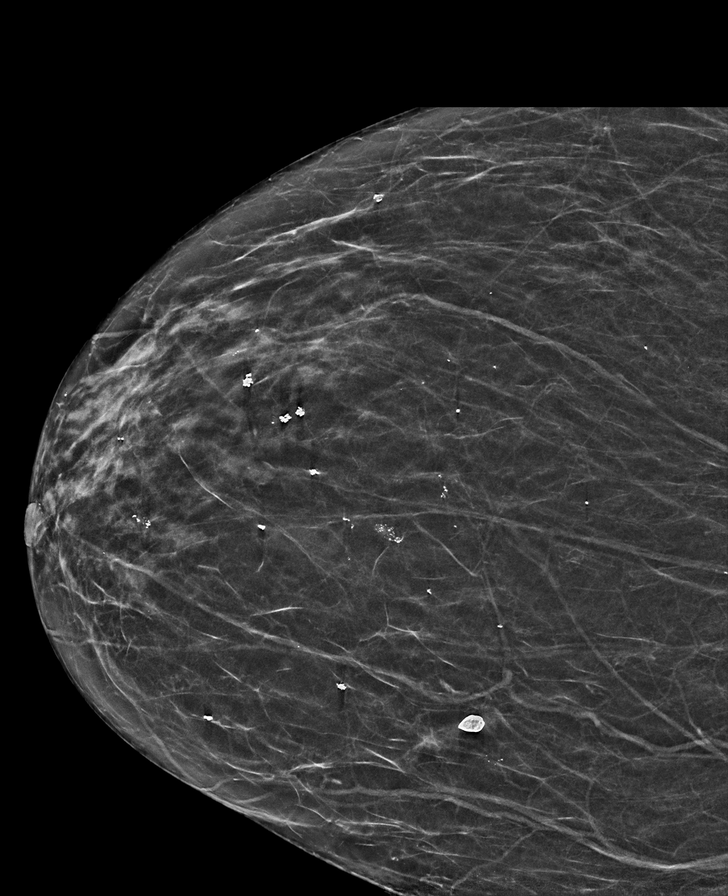

[L MLO synth-2D (1 of 2)]
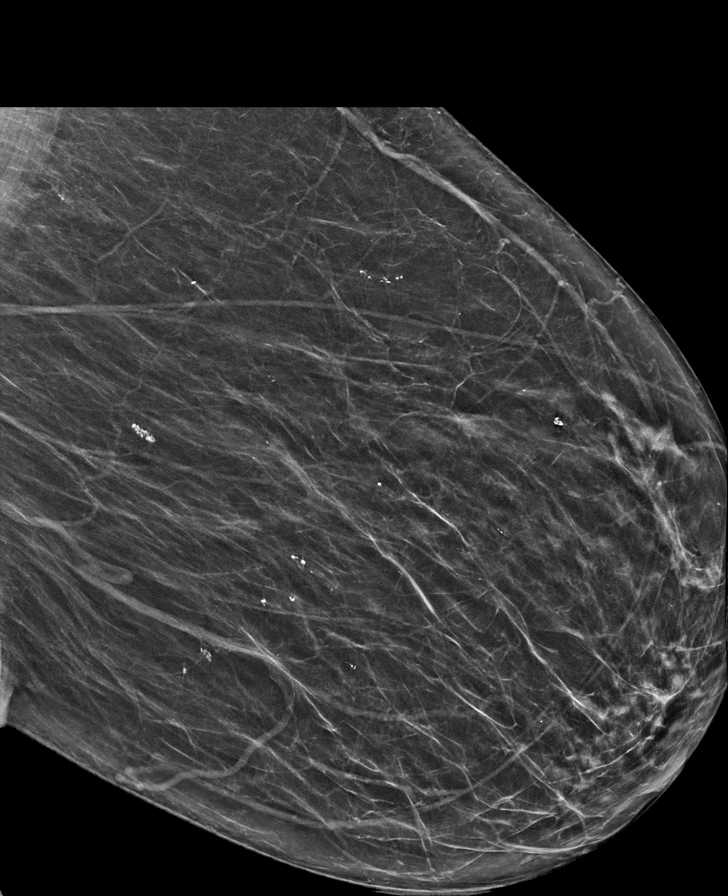

[L CV synth-2D]
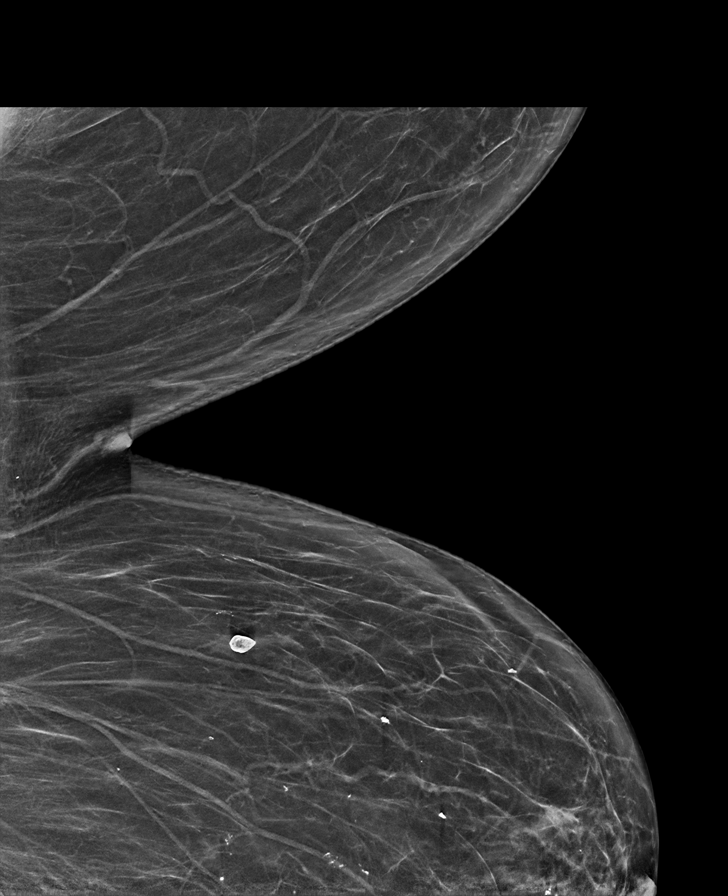

[L MLO synth-2D (2 of 2)]
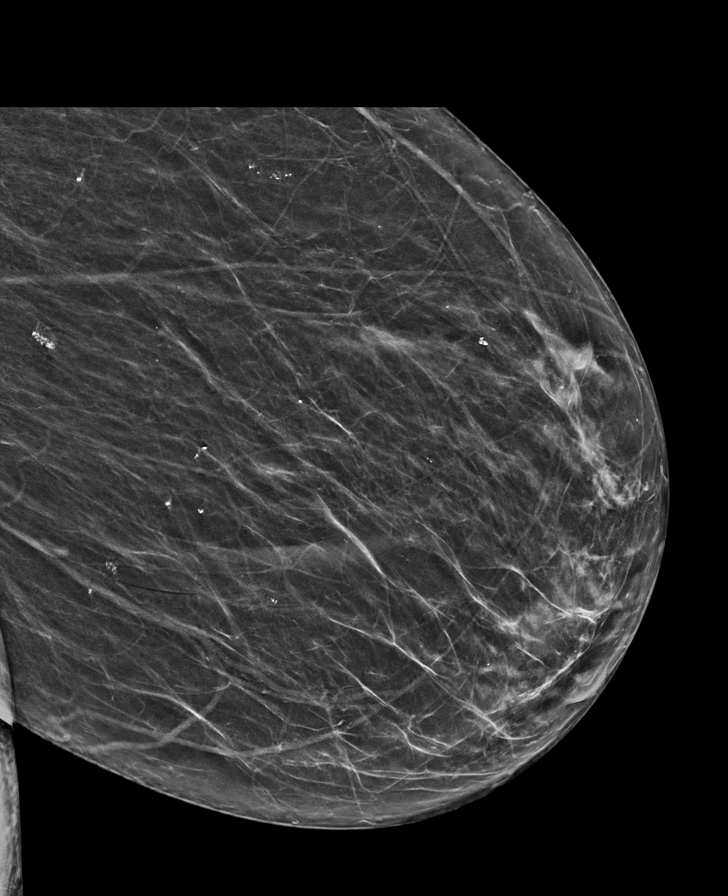

[L CC synth-2D]
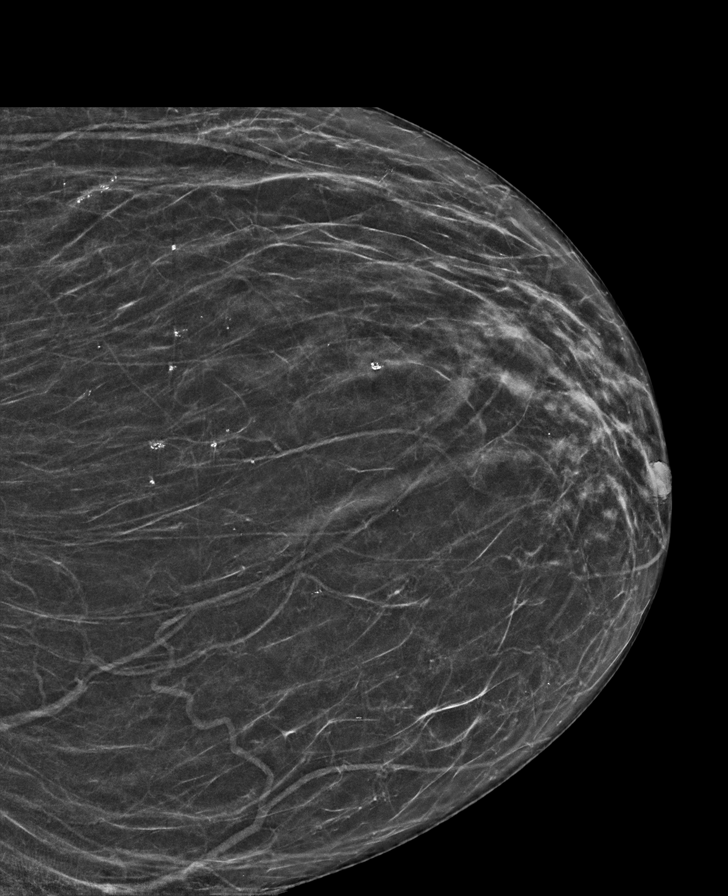

[L MLO tomo · tomo slice 37/74.0]
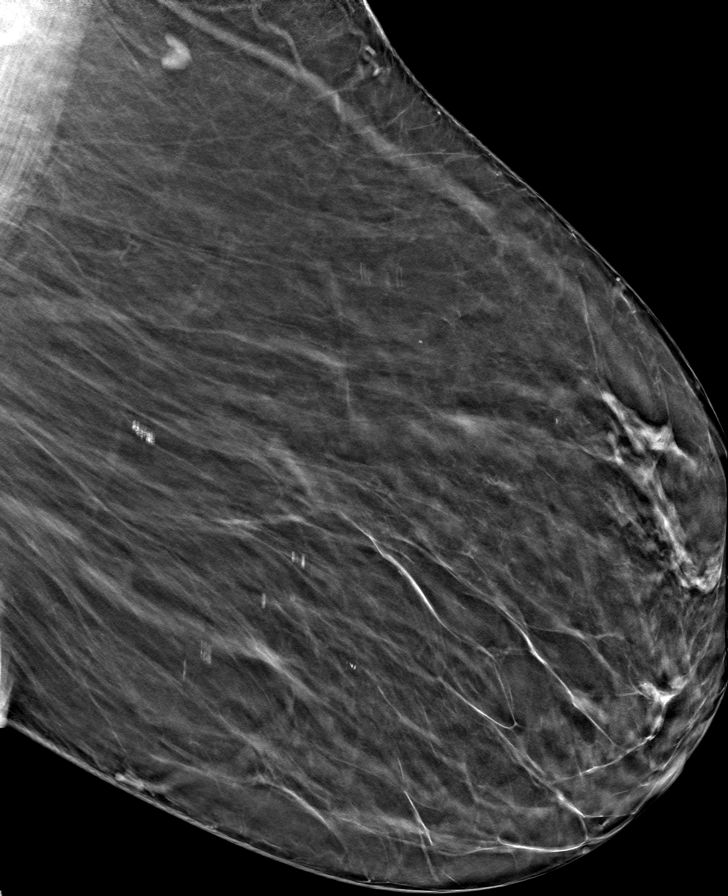

[8 of 40 positions shown; findings below may reference images not displayed]

ACR Breast Density Category b: There are scattered areas of
fibroglandular density.
FINDINGS: There are no new dominant masses, suspicious calcifications or
secondary signs of malignancy within either breast. Specifically,
there is no mammographic abnormality within the inner LEFT breast
corresponding to the area of clinical concern.

Targeted ultrasound is performed, evaluating the inner LEFT breast
with particular attention to the 8 o'clock axis as directed by the
patient, showing only normal fibroglandular tissues and fat lobules
throughout. No solid or cystic mass.
IMPRESSION: No evidence of malignancy within either breast.

RECOMMENDATION:
1.  Screening mammogram in one year.(Code:[H9])
2. The patient was instructed to return sooner if a new palpable
abnormality is identified in either breast.

I have discussed the findings and recommendations with the patient.
If applicable, a reminder letter will be sent to the patient
regarding the next appointment.

BI-RADS CATEGORY  1: Negative.

## 2021-07-24 IMAGING — US US BREAST*L* LIMITED INC AXILLA
1 series · 4 of 4 positions shown · non-contrast
Comparison: Previous exam(s).

CLINICAL DATA: Patient describes uncomfortable sensation within the
inner LEFT breast, intermittent, once or twice per week.

EXAM:
DIGITAL DIAGNOSTIC BILATERAL MAMMOGRAM WITH TOMOSYNTHESIS AND CAD;
ULTRASOUND LEFT BREAST LIMITED
TECHNIQUE: Bilateral digital diagnostic mammography and breast tomosynthesis
was performed. The images were evaluated with computer-aided
detection.; Targeted ultrasound examination of the left breast was
performed.

[Series 1: us breast*left* limited inc axilla · 0.08mm/px · 4 of 4 slices shown]
[im 1/4]
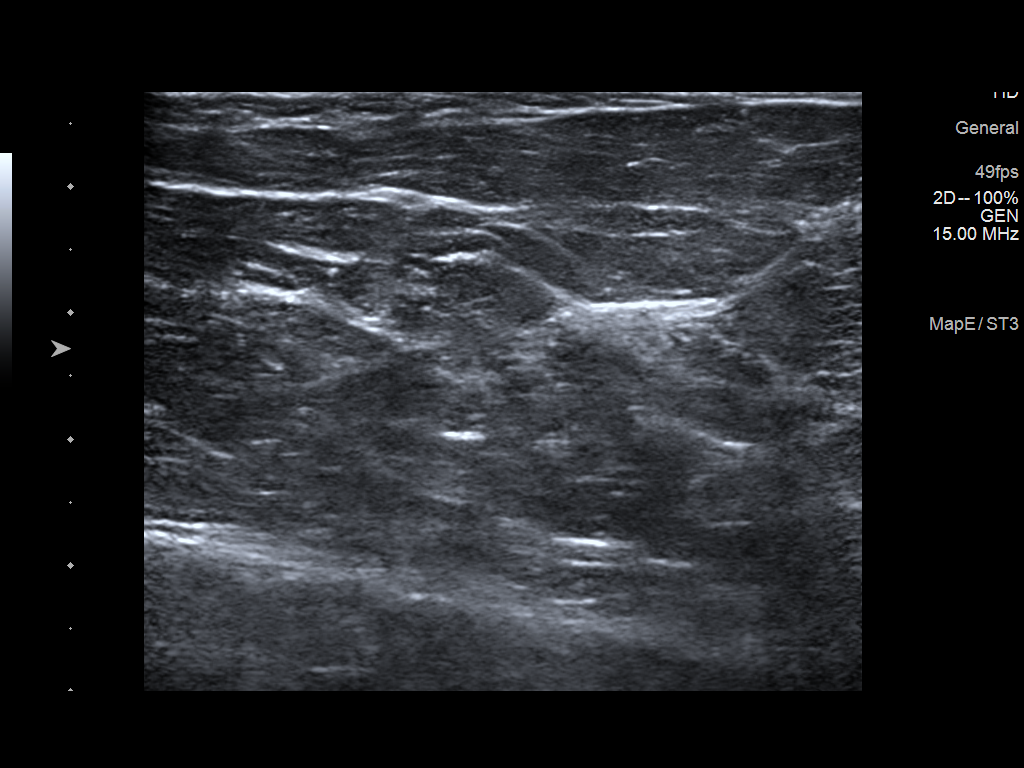
[im 2/4]
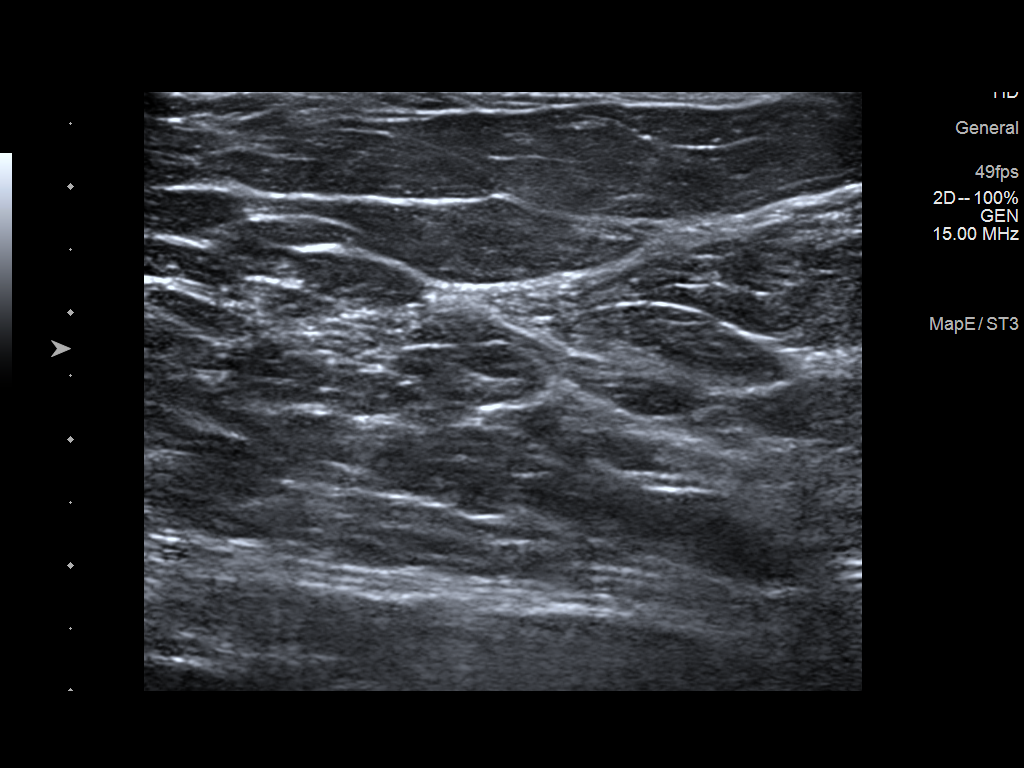
[im 3/4]
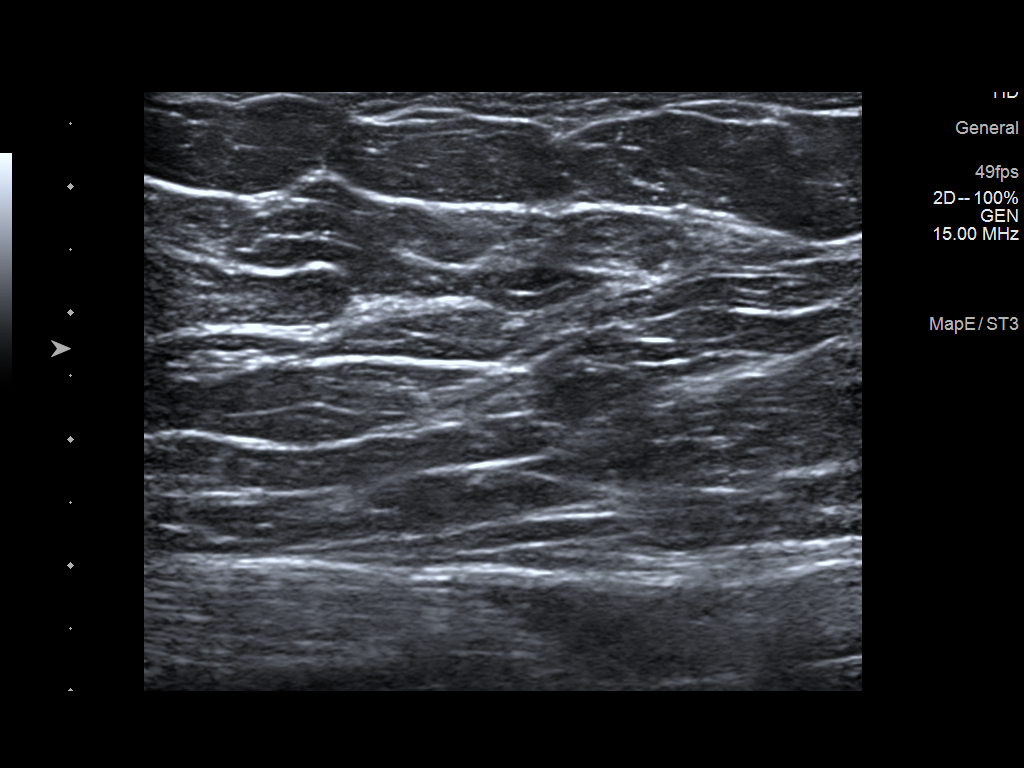
[im 4/4]
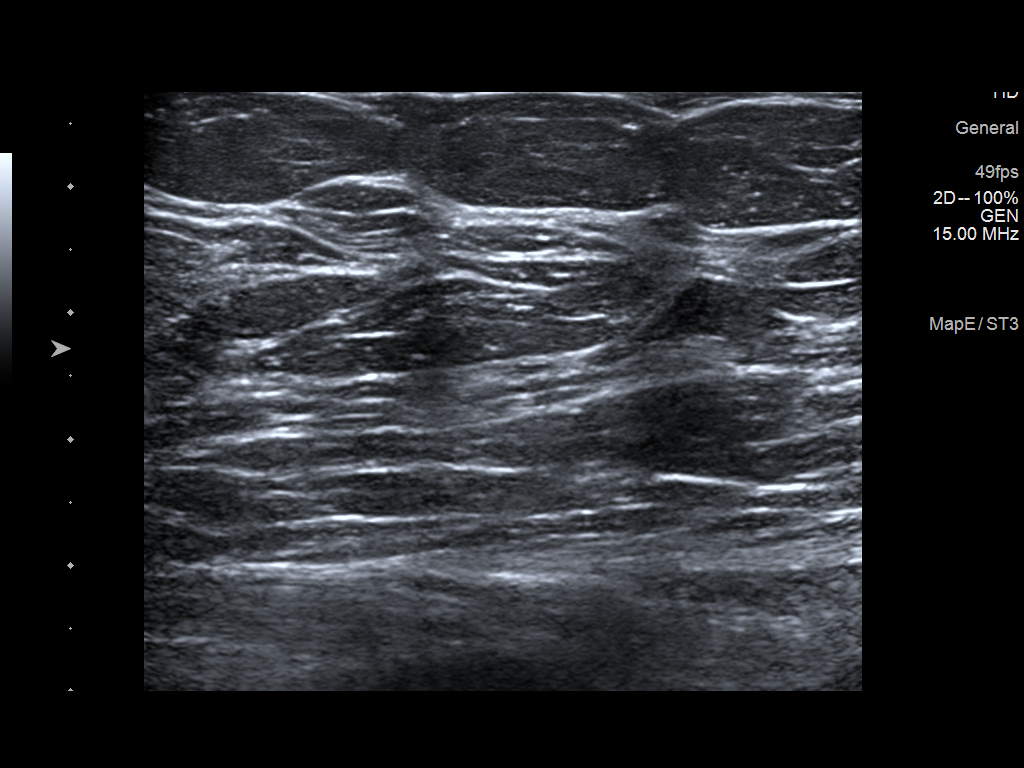

[4 of 4 positions shown; findings below may reference images not displayed]

ACR Breast Density Category b: There are scattered areas of
fibroglandular density.
FINDINGS: There are no new dominant masses, suspicious calcifications or
secondary signs of malignancy within either breast. Specifically,
there is no mammographic abnormality within the inner LEFT breast
corresponding to the area of clinical concern.

Targeted ultrasound is performed, evaluating the inner LEFT breast
with particular attention to the 8 o'clock axis as directed by the
patient, showing only normal fibroglandular tissues and fat lobules
throughout. No solid or cystic mass.
IMPRESSION: No evidence of malignancy within either breast.

RECOMMENDATION:
1.  Screening mammogram in one year.(Code:[H9])
2. The patient was instructed to return sooner if a new palpable
abnormality is identified in either breast.

I have discussed the findings and recommendations with the patient.
If applicable, a reminder letter will be sent to the patient
regarding the next appointment.

BI-RADS CATEGORY  1: Negative.

## 2021-07-26 ENCOUNTER — Encounter: Payer: Self-pay | Admitting: Neurology

## 2021-07-26 ENCOUNTER — Telehealth: Payer: Self-pay | Admitting: Adult Health

## 2021-07-26 ENCOUNTER — Ambulatory Visit: Payer: Medicare Other | Admitting: Neurology

## 2021-07-26 ENCOUNTER — Other Ambulatory Visit: Payer: Self-pay | Admitting: Neurology

## 2021-07-26 ENCOUNTER — Telehealth: Payer: Self-pay | Admitting: *Deleted

## 2021-07-26 DIAGNOSIS — I6522 Occlusion and stenosis of left carotid artery: Secondary | ICD-10-CM

## 2021-07-26 NOTE — Telephone Encounter (Signed)
The patient will not have the time to get to the open appt today. She will keep her follow up with [name] on 08/31/21 and stay on wait list. She would like her concerned addressed by Dr. Leonie Man today. Telephone note will be sent to MD for further plan.

## 2021-07-26 NOTE — Telephone Encounter (Signed)
Patient seen in our office for CVA hospital follow up on 01/25/21. Her six month revisit was on 07/27/21 with [name]. The patient had to be rescheduled due to an unplanned change in the provider's schedule. She now has a pending appt on 08/31/21 and is actively on the wait list.  She has concerns over intermittent, left-sided neck stiffness, present for two weeks.No pain. States she has been seen by her PCP and he did not uncover a reason. Reports feeling anxious over this new symptoms because it is over her carotid artery. She insist this message be sent to Dr. Leonie Man for further review, in case a test needs to be scheduled prior to her next office visit. She would like a call back with a plan.

## 2021-07-26 NOTE — Telephone Encounter (Signed)
[name] will be out tomorrow so I spoke with patient and rescheduled. I added her to the wait list and she says she would really like to be seen sooner and preferably with Dr. Leonie Man. He doesn't have anything available but I told her we will call her if anything opens up and that she can send him a mychart message directly with her concerns.

## 2021-07-26 NOTE — Telephone Encounter (Signed)
noted 

## 2021-07-26 NOTE — Telephone Encounter (Signed)
I spoke to the patient. She verbalized understanding of Dr. Clydene Fake response. She understands she will receive a call to schedule her test.

## 2021-07-26 NOTE — Telephone Encounter (Signed)
I spoke to the patient. She has been rescheduled to see Dr. Leonie Man on 07/27/21.

## 2021-07-27 ENCOUNTER — Ambulatory Visit: Payer: Medicare Other | Admitting: Adult Health

## 2021-07-27 DIAGNOSIS — Z96642 Presence of left artificial hip joint: Secondary | ICD-10-CM | POA: Diagnosis not present

## 2021-08-02 ENCOUNTER — Telehealth: Payer: Self-pay | Admitting: Neurology

## 2021-08-02 NOTE — Telephone Encounter (Signed)
Medicare/[employer] supp no auth req. Sent Butch Penny a message she will reach out to the patient to schedule.

## 2021-08-04 ENCOUNTER — Other Ambulatory Visit: Payer: Self-pay

## 2021-08-04 ENCOUNTER — Ambulatory Visit (HOSPITAL_COMMUNITY)
Admission: RE | Admit: 2021-08-04 | Discharge: 2021-08-04 | Disposition: A | Discharge status: Home / Self Care | Payer: Medicare Other | Source: Ambulatory Visit | Attending: Neurology | Admitting: Neurology

## 2021-08-04 DIAGNOSIS — I6522 Occlusion and stenosis of left carotid artery: Secondary | ICD-10-CM

## 2021-08-08 ENCOUNTER — Telehealth: Payer: Self-pay | Admitting: *Deleted

## 2021-08-08 NOTE — Telephone Encounter (Signed)
I spoke to the patient. She verbalized understanding of her results. She will continue her daily aspirin and keep her pending follow up.

## 2021-08-08 NOTE — Telephone Encounter (Signed)
-----   Message from Britt Bottom, MD sent at 08/08/2021  8:17 AM EST ----- Please let her know that the Doppler study showed moderate stenosis in both of the carotid arteries.  She is scheduled to see [name] in a couple weeks.  She should continue on the aspirin daily.  Please schedule repeat study in 6 months.

## 2021-08-10 ENCOUNTER — Other Ambulatory Visit: Payer: Medicare Other

## 2021-08-11 DIAGNOSIS — M47896 Other spondylosis, lumbar region: Secondary | ICD-10-CM | POA: Diagnosis not present

## 2021-08-11 DIAGNOSIS — M47816 Spondylosis without myelopathy or radiculopathy, lumbar region: Secondary | ICD-10-CM | POA: Diagnosis not present

## 2021-08-31 ENCOUNTER — Encounter: Payer: Self-pay | Admitting: Adult Health

## 2021-08-31 ENCOUNTER — Ambulatory Visit: Payer: Medicare Other | Admitting: Adult Health

## 2021-08-31 VITALS — BP 173/86 | HR 103

## 2021-08-31 DIAGNOSIS — I6381 Other cerebral infarction due to occlusion or stenosis of small artery: Secondary | ICD-10-CM

## 2021-08-31 DIAGNOSIS — I6523 Occlusion and stenosis of bilateral carotid arteries: Secondary | ICD-10-CM

## 2021-08-31 DIAGNOSIS — E785 Hyperlipidemia, unspecified: Secondary | ICD-10-CM | POA: Diagnosis not present

## 2021-08-31 NOTE — Progress Notes (Signed)
[facility] [address] [place] [address] [phone]       OFFICE FOLLOW-UP NOTE  [name] Date of Birth:  11-Dec-1932 Medical Record Number:  [id number]    Primary neurologist: Dr. Leonie Man Reason for visit: stroke follow up   Chief Complaint  Patient presents with   Follow-up    RM 2 alone PT is well and stable,  no new stroke concerns Mentions stiffness in neck       HPI:   Update, 08/31/2021, JM: [name] is here today for a stroke follow-up unaccompanied.  Stable from stroke standpoint, denies new stroke/TIA symptoms or any seizure activity. C/o left sided neck stiffness onset 07/2021, denies pain, numbness/tingling or weakness - Multiple calls and MyChart messages regarding this complaint - she was greatly concerned about this having to do with her L ICA stenosis - repeat carotid ultrasound 07/2021 showed b/l ICA 40 to 59% stenosis (prior L ICA 40 to 59% stenosis, R ICA 1 to 39% stenosis). Remains on Crestor and Aspirin without side effects. BP today elevated (reports this is normal at appointments). Routinely monitors at home and typically 120-130s/80s.  Labs A1c 5.9, LDL 94 (06/2021). She is unsure if she was taking a statin when cholesterol was last checked.  Routinely follows with Ortho receiving lumbar injections.  No further concerns at this time.   History provided for reference purposes only Initial visit, 01/25/2021, Dr Leonie Man: [name] is a pleasant 88 year old African-American lady seen today for initial office follow-up visit after hospital consultation for stroke in March 2022.  History is obtained from patient and review of electronic medical records and personally reviewed pertinent imaging films in PACS.  She has past medical history of hypertension, hyperlipidemia, obesity, COPD who presented with 1 and half weeks of shortness of breath and leg swelling on 10/23/2020.  Primary care physician had stopped hydrochlorothiazide due  to increasing leg swellings.  Patient developed respiratory distress and was intubated.  She developed some slurred speech and involuntary rhythmic movements of right greater than left upper extremity and some periods of confusion and disorientation possibly from hypoxia.  MRI scan of the brain was obtained which showed tiny punctate left frontal white matter infarct which is felt to be clinically silent and incidental.  Carotid ultrasound showed 40 to 59% left ICA and 1-39% right ICA stenosis.  2D echo showed ejection fraction 65 to 70%.  LDL cholesterol was elevated at 105 mg percent hemoglobin A1c was borderline at 5.9.  EEG showed diffuse generalized slowing without epileptiform activity.  Patient was started on aspirin for stroke prevention.  She states she is doing well since discharge exam no recurrent stroke or TIA symptoms.  She is tolerating aspirin well without bruising or bleeding.  Her blood pressure at home is quite good controlled but today it is elevated in office at 159/72.  Is tolerating Crestor well without muscle aches and pains.  She does use a wheeled walker for ambulation but no further neurological issues.  There is no prior history of strokes TIA ,seizures, migraines or neurological problems.    ROS:   14 system review of systems is positive for neck stiffness, all other systems negative  PMH:  Past Medical History:  Diagnosis Date   Colon polyp    COPD (chronic obstructive pulmonary disease) (HCC)    MILD   Hyperlipidemia    Hypertension    OA (osteoarthritis)    OF THE KNEES   Osteopenia  Shingles    Stroke [facility])    Thyroid cyst     Social History:  Social History   Socioeconomic History   Marital status: Widowed    Spouse name: Not on file   Number of children: Not on file   Years of education: Not on file   Highest education level: Not on file  Occupational History   Not on file  Tobacco Use   Smoking status: Former   Smokeless tobacco: Never   Vaping Use   Vaping Use: Never used  Substance and Sexual Activity   Alcohol use: Not Currently   Drug use: Never   Sexual activity: Not on file  Other Topics Concern   Not on file  Social History Narrative   Lives alone   Right Handed   Drinks decaf   Social Determinants of Health   Financial Resource Strain: Not on file  Food Insecurity: Not on file  Transportation Needs: Not on file  Physical Activity: Not on file  Stress: Not on file  Social Connections: Not on file  Intimate Partner Violence: Not on file    Medications:   Current Outpatient Medications on File Prior to Visit  Medication Sig Dispense Refill   amLODipine (NORVASC) 2.5 MG tablet Take 2.5 mg by mouth daily.     aspirin EC 81 MG EC tablet Take 1 tablet (81 mg total) by mouth daily. Swallow whole. 30 tablet 11   Cholecalciferol (VITAMIN D PO) Take 1,000 Units by mouth daily.     docusate sodium (COLACE) 100 MG capsule Take 1 capsule (100 mg total) by mouth daily as needed for mild constipation. 60 capsule 0   furosemide (LASIX) 20 MG tablet 1 tablet     rosuvastatin (CRESTOR) 5 MG tablet 1 tablet     No current facility-administered medications on file prior to visit.    Allergies:   Allergies  Allergen Reactions   Atorvastatin     Other reaction(s): soreness   Ceftin [Cefuroxime] Diarrhea   Cefuroxime Axetil     Other reaction(s): severe diarrhea   Clindamycin Hcl     Other reaction(s): itching   Clindamycin/Lincomycin Itching   Moxifloxacin     Other reaction(s): itching Other reaction(s): itching   Simvastatin     Muscle pain with large dose Other reaction(s): myalgia with higher doses   Doxycycline Hyclate Diarrhea and Rash    Other reaction(s): rash, diarrhea   Penicillins Rash    Physical Exam Today's Vitals   08/31/21 0934  BP: (!) 173/86  Pulse: (!) 103   There is no height or weight on file to calculate BMI.   General: obese very pleasant elderly african american lady,  seated, in no evident distress Head: head normocephalic and atraumatic.  Neck: supple with no carotid or supraclavicular bruits Cardiovascular: regular rate and rhythm, no murmurs Musculoskeletal: tenderness upon left-sided sternocleidomastoid palpation and upper trapezius Skin:  no rash/petichiae Vascular:  Normal pulses all extremities  Neurologic Exam Mental Status: Awake and fully alert. Fluent speech and language. Oriented to place and time. Recent and remote memory intact. Attention span, concentration and fund of knowledge appropriate. Mood and affect appropriate.  Cranial Nerves: Pupils equal, briskly reactive to light. Extraocular movements full without nystagmus.  But has mild exotropia of the left eye visual fields full to confrontation. Hearing intact. Facial sensation intact. Face, tongue, palate moves normally and symmetrically.  Motor: Normal bulk and tone. Normal strength in all tested extremity muscles. Sensory.: intact to touch ,pinprick .  position and vibratory sensation.  Coordination: Rapid alternating movements normal in all extremities. Finger-to-nose and heel-to-shin performed accurately bilaterally. Gait and Station: deferred as RW not present  Reflexes: 1+ and symmetric. Toes downgoing.        ASSESSMENT: 88 year old African-American lady with silent small left frontal white matter lacunar infarct from small vessel disease in March 2022 without residual deficits discovered during hospitalization for respiratory failure and CHF.  Vascular risk factors of hyperlipidemia, hypertension, carotid stenosis, obesity and CHF    Lacunar Infarct:  Continue aspirin 81 mg daily and simvastatin 40 mg daily for secondary stroke prevention.  Discussed secondary stroke prevention measures and importance of close PCP follow up for aggressive stroke risk factor management  HTN: BP goal <130/90.   HLD: LDL goal <70. Recent LDL 95 (06/2021) on Crestor 5 mg daily - repeat today. If  remains above goal, will recommend increasing dosage especially in setting of carotid stenosis Left sided neck stiffness: appears musculoskeletal. No red flags - reassured low suspicion related to carotid artery stenosis.  Discussed use of gentle stretching, use of heat, good pillow support when sleeping and good posture. Can continue to be monitored by PCP or ortho Carotid stenosis: Plan on repeat carotid ultrasound 01/2022. CUS 07/2021 showed b/l ICA 40 to 59% stenosis; CUS 10/2020 showed R ICA 40 to 59% stenosis and left ICA 1 to 39% stenosis   Follow up in 6 months or call earlier if needed    CC:  Shirline Frees, MD   I spent 31 minutes of face-to-face and non-face-to-face time with patient.  This included previsit chart review, lab review, study review, order entry, electronic health record documentation, patient education and discussion regarding history of prior stroke including secondary stroke prevention measures and aggressive stroke risk factor management, carotid stenosis and surveillance monitoring, left-sided neck stiffness and conservative measures and answered all other questions to patient satisfaction  Frann Rider, AGNP-BC  [facility] [address] [place], [place] [address]  Phone [phone] Fax [phone] Note: This document was prepared with digital dictation and possible smart phrase technology. Any transcriptional errors that result from this process are unintentional.

## 2021-08-31 NOTE — Patient Instructions (Signed)
Continue aspirin 81 mg daily  and Crestor for secondary stroke prevention  We will plan on repeating a carotid ultrasound around June  Continue to follow up with PCP regarding cholesterol and blood pressure management  Maintain strict control of hypertension with blood pressure goal below 130/90 and cholesterol with LDL cholesterol (bad cholesterol) goal below 70 mg/dL.   Signs of a Stroke? Follow the BEFAST method:  Balance Watch for a sudden loss of balance, trouble with coordination or vertigo Eyes Is there a sudden loss of vision in one or both eyes? Or double vision?  Face: Ask the person to smile. Does one side of the face droop or is it numb?  Arms: Ask the person to raise both arms. Does one arm drift downward? Is there weakness or numbness of a leg? Speech: Ask the person to repeat a simple phrase. Does the speech sound slurred/strange? Is the person confused ? Time: If you observe any of these signs, call 911.     Followup in the future with me in 6 months or call earlier if needed       Thank you for coming to see [place] at [facility]. I hope we have been able to provide you high quality care today.  You may receive a patient satisfaction survey over the next few weeks. We would appreciate your feedback and comments so that we may continue to improve ourselves and the health of our patients.

## 2021-09-01 LAB — LIPID PANEL
Chol/HDL Ratio: 3.1 ratio (ref 0.0–4.4)
Cholesterol, Total: 165 mg/dL (ref 100–199)
HDL: 53 mg/dL (ref >39)
LDL Chol Calc (NIH): 93 mg/dL (ref 0–99)
Triglycerides: 105 mg/dL (ref 0–149)
VLDL Cholesterol Cal: 19 mg/dL (ref 5–40)

## 2021-09-04 ENCOUNTER — Telehealth: Payer: Self-pay | Admitting: Adult Health

## 2021-09-04 ENCOUNTER — Other Ambulatory Visit: Payer: Self-pay | Admitting: Adult Health

## 2021-09-04 ENCOUNTER — Telehealth: Payer: Self-pay

## 2021-09-04 MED ORDER — ROSUVASTATIN CALCIUM 20 MG PO TABS: 20.0000 mg | ORAL_TABLET | Freq: Every day | ORAL | 3 refills | Status: DC

## 2021-09-04 NOTE — Telephone Encounter (Signed)
Pt called back, questioned if [name] was aware that she was on 5mg  of Crestor due to the big increase to 20mg . Informed her yes she is aware as we did discuss this this morning that [name] wanted her to go from 5mg  to 20mg , which was also sent to her mychart. She also question the reason I didn't call her back, informed her we spoke this morning and from the message I received from phone staff, she just needed the dosage in her MyChart, which I sent+results. Pt again states she will take this up with her PCP, which was previously mentioned in earlier conversation. Pt had no further questions and understood.

## 2021-09-04 NOTE — Telephone Encounter (Signed)
Pt rq results to be in her mychart, informed her it should be there maybe under test or results im not sure what it looks like on her end of it. but ill send her a mychart message as well ith the results.

## 2021-09-04 NOTE — Telephone Encounter (Signed)
-----   Message from Frann Rider, NP sent at 09/04/2021  7:47 AM EST ----- Patient requested phone call for results - please advise patient that recent LDL or bad cholesterol is at 93 with goal of less than 70. Recommend increasing Crestor from 5mg  to 20mg  daily due to continued elevated LDL above goal and carotid stenosis. Thank you.

## 2021-09-04 NOTE — Telephone Encounter (Signed)
Contacted pt regarding labs, informed her that recent LDL or bad cholesterol is at 93 with goal of less than 70. [name] Recommend increasing Crestor from 5mg  to 20mg  daily due to continued elevated LDL above goal and carotid stenosis. Pt states she will take this up with her PCP as well. Advised to contacted [place] with questions or concerns, she understood.

## 2021-09-27 NOTE — Telephone Encounter (Signed)
Error

## 2021-10-16 ENCOUNTER — Encounter: Payer: Self-pay | Admitting: Podiatry

## 2021-10-16 ENCOUNTER — Other Ambulatory Visit: Payer: Self-pay

## 2021-10-16 ENCOUNTER — Ambulatory Visit: Payer: Medicare Other | Admitting: Podiatry

## 2021-10-16 DIAGNOSIS — B351 Tinea unguium: Secondary | ICD-10-CM

## 2021-10-16 DIAGNOSIS — M79674 Pain in right toe(s): Secondary | ICD-10-CM | POA: Diagnosis not present

## 2021-10-16 DIAGNOSIS — M899 Disorder of bone, unspecified: Secondary | ICD-10-CM | POA: Insufficient documentation

## 2021-10-16 DIAGNOSIS — M79675 Pain in left toe(s): Secondary | ICD-10-CM

## 2021-10-22 NOTE — Progress Notes (Signed)
?  Subjective:  ?Patient ID: [name], female    DOB: [birth year],  MRN: [id number] ? ?[name] presents to clinic today for painful elongated mycotic toenails 1-5 bilaterally which are tender when wearing enclosed shoe gear. Pain is relieved with periodic professional debridement. ? ?New problem(s): None.  ? ?PCP is Linda Frees, MD , and last visit was September 11, 2021. ? ?Allergies  ?Allergen Reactions  ? Atorvastatin   ?  Other reaction(s): soreness  ? Ceftin [Cefuroxime] Diarrhea  ? Cefuroxime Axetil   ?  Other reaction(s): severe diarrhea  ? Clindamycin Hcl   ?  Other reaction(s): itching  ? Clindamycin/Lincomycin Itching  ? Moxifloxacin   ?  Other reaction(s): itching ?Other reaction(s): itching ?Other reaction(s): itching  ? Simvastatin   ?  Muscle pain with large dose ?Other reaction(s): myalgia with higher doses  ? Doxycycline Hyclate Diarrhea and Rash  ?  Other reaction(s): rash, diarrhea  ? Penicillins Rash  ? ? ?Review of Systems: Negative except as noted in the HPI. ? ?Objective: ?Constitutional [name] is a pleasant 88 y.o. African American female, in NAD. AAO x 3.   ?Vascular CFT immediate b/l LE. Palpable DP/PT pulses b/l LE. Digital hair present b/l. Skin temperature gradient WNL b/l. No pain with calf compression b/l. Trace edema b/l LE. No cyanosis or clubbing noted b/l LE.  ?Neurologic Normal speech. Oriented to person, place, and time. Protective sensation intact 5/5 intact bilaterally with 10g monofilament b/l. Vibratory sensation intact b/l.  ?Dermatologic Pedal integument with normal turgor, texture and tone b/l LE. No open wounds b/l. No interdigital macerations b/l. Toenails 1-5 b/l elongated, thickened, discolored with subungual debris. +Tenderness with dorsal palpation of nailplates. No hyperkeratotic or porokeratotic lesions present.  ?Orthopedic: Muscle strength 5/5 to all lower extremity muscle groups bilaterally. HAV with bunion deformity noted b/l LE. Hammertoe  deformity noted 2-5 b/l. Utilizes motorized chair for mobility assistance.  ? ?Hemoglobin A1C Latest Ref Rng & Units 10/24/2020  ?HGBA1C 4.8 - 5.6 % 5.9(H)  ?Some recent data might be hidden  ?Assessment/Plan: ?1. Pain due to onychomycosis of toenails of both feet   ?-Patient to continue soft, supportive shoe gear daily. ?-Toenails 1-5 b/l were debrided in length and girth with sterile nail nippers and dremel without iatrogenic bleeding.  ?-Patient/POA to call should there be question/concern in the interim.  ? ?Return in about 3 months (around 01/16/2022). ? ?[name], DPM  ?

## 2021-12-04 DIAGNOSIS — M5416 Radiculopathy, lumbar region: Secondary | ICD-10-CM | POA: Insufficient documentation

## 2022-01-11 ENCOUNTER — Other Ambulatory Visit: Payer: Self-pay | Admitting: Adult Health

## 2022-01-11 DIAGNOSIS — I6523 Occlusion and stenosis of bilateral carotid arteries: Secondary | ICD-10-CM

## 2022-01-15 DIAGNOSIS — M25562 Pain in left knee: Secondary | ICD-10-CM | POA: Insufficient documentation

## 2022-01-16 ENCOUNTER — Ambulatory Visit: Payer: Medicare Other | Admitting: Podiatry

## 2022-01-16 ENCOUNTER — Encounter: Payer: Self-pay | Admitting: Podiatry

## 2022-01-16 DIAGNOSIS — M79675 Pain in left toe(s): Secondary | ICD-10-CM

## 2022-01-16 DIAGNOSIS — B351 Tinea unguium: Secondary | ICD-10-CM | POA: Diagnosis not present

## 2022-01-16 DIAGNOSIS — M79674 Pain in right toe(s): Secondary | ICD-10-CM | POA: Diagnosis not present

## 2022-01-21 NOTE — Progress Notes (Signed)
  Subjective:  Patient ID: [name], female    DOB: 1933/03/12,  MRN: [id number]  [name] presents to clinic today for painful elongated mycotic toenails 1-5 bilaterally which are tender when wearing enclosed shoe gear. Pain is relieved with periodic professional debridement.  New problem(s): None.   PCP is Shirline Frees, MD , and last visit was Jan 03, 2022.  Allergies  Allergen Reactions   Atorvastatin     Other reaction(s): soreness   Ceftin [Cefuroxime] Diarrhea   Cefuroxime Axetil     Other reaction(s): severe diarrhea   Clindamycin Hcl     Other reaction(s): itching   Clindamycin/Lincomycin Itching   Moxifloxacin     Other reaction(s): itching Other reaction(s): itching Other reaction(s): itching Other reaction(s): itching   Simvastatin     Muscle pain with large dose Other reaction(s): myalgia with higher doses   Doxycycline Hyclate Diarrhea and Rash    Other reaction(s): rash, diarrhea   Penicillins Rash    Review of Systems: Negative except as noted in the HPI.  Objective: No changes noted in today's physical examination. Constitutional [name] is a pleasant 88 y.o. African American female, in NAD. AAO x 3.   Vascular CFT immediate b/l LE. Palpable DP/PT pulses b/l LE. Digital hair present b/l. Skin temperature gradient WNL b/l. No pain with calf compression b/l. Trace edema b/l LE. No cyanosis or clubbing noted b/l LE.  Neurologic Normal speech. Oriented to person, place, and time. Protective sensation intact 5/5 intact bilaterally with 10g monofilament b/l. Vibratory sensation intact b/l.  Dermatologic Pedal integument with normal turgor, texture and tone b/l LE. No open wounds b/l. No interdigital macerations b/l. Toenails 1-5 b/l elongated, thickened, discolored with subungual debris. +Tenderness with dorsal palpation of nailplates. No hyperkeratotic or porokeratotic lesions present.  Orthopedic: Muscle strength 5/5 to all lower extremity muscle  groups bilaterally. HAV with bunion deformity noted b/l LE. Hammertoe deformity noted 2-5 b/l. Utilizes motorized chair for mobility assistance.   Assessment/Plan: 1. Pain due to onychomycosis of toenails of both feet     -Patient was evaluated and treated. All patient's and/or POA's questions/concerns answered on today's visit. -Patient to continue soft, supportive shoe gear daily. -Mycotic toenails 1-5 bilaterally were debrided in length and girth with sterile nail nippers and dremel without incident. -Patient/POA to call should there be question/concern in the interim.   Return in about 3 months (around 04/18/2022).  Marzetta Board, DPM

## 2022-02-05 ENCOUNTER — Ambulatory Visit (HOSPITAL_COMMUNITY): Payer: Medicare Other

## 2022-02-08 ENCOUNTER — Encounter (HOSPITAL_COMMUNITY): Payer: Medicare Other

## 2022-02-15 ENCOUNTER — Ambulatory Visit (HOSPITAL_COMMUNITY)
Admission: RE | Admit: 2022-02-15 | Discharge: 2022-02-15 | Disposition: A | Discharge status: Home / Self Care | Payer: Medicare Other | Source: Ambulatory Visit | Attending: Adult Health | Admitting: Adult Health

## 2022-02-15 DIAGNOSIS — I6523 Occlusion and stenosis of bilateral carotid arteries: Secondary | ICD-10-CM | POA: Diagnosis present

## 2022-02-20 ENCOUNTER — Telehealth: Payer: Self-pay | Admitting: *Deleted

## 2022-02-20 NOTE — Telephone Encounter (Signed)
Called patient and advised that recent carotid ultrasound showed bilateral carotid stenosis at 40 to 59%. Will plan on repeat ultrasound in 6 months for surveillance monitoring. She asked if it was same as last [place]. I advised her it was. Patient verbalized understanding, appreciation.

## 2022-03-07 NOTE — Progress Notes (Unsigned)
[facility] [address] [place] [address] [phone]       OFFICE FOLLOW-UP NOTE  [name] Date of Birth:  08/25/1932 Medical Record Number:  [id number]    Primary neurologist: Dr. Leonie Man Reason for visit: stroke follow up   No chief complaint on file.     HPI:   Update 03/07/2022 JM: Patient returns for 24-monthstroke follow-up.  Overall stable since prior visit without new or reoccurring stroke/TIA or neurological symptoms.  Reports compliance on aspirin without side effects.  Reports compliance on Crestor *** without side effects.  At prior visit, lipid panel showed LDL 93 and recommended increasing Crestor from 5 mg to 20 mg daily but she wished to further discuss this with PCP.  PCP repeat a lipid panel back in May which showed LDL at 91.   Blood pressure today ***.   Repeat carotid ultrasound 02/2022 showed continued bilateral ICA 40 to 59% stenosis Routinely follows with PCP Dr. HKenton Kingfisher      History provided for reference purposes only Update, 08/31/2021, JM: [name] here today for a stroke follow-up unaccompanied.  Stable from stroke standpoint, denies new stroke/TIA symptoms or any seizure activity. C/o left sided neck stiffness onset 07/2021, denies pain, numbness/tingling or weakness - Multiple calls and MyChart messages regarding this complaint - she was greatly concerned about this having to do with her L ICA stenosis - repeat carotid ultrasound 07/2021 showed b/l ICA 40 to 59% stenosis (prior L ICA 40 to 59% stenosis, R ICA 1 to 39% stenosis). Remains on Crestor and Aspirin without side effects. BP today elevated (reports this is normal at appointments). Routinely monitors at home and typically 120-130s/80s.  Labs A1c 5.9, LDL 94 (06/2021). She is unsure if she was taking a statin when cholesterol was last checked.  Routinely follows with Ortho receiving lumbar injections.  No further concerns at this time.  Initial visit,  01/25/2021, [name] [name] a pleasant 88year old African-American lady seen today for initial office follow-up visit after hospital consultation for stroke in March 2022.  History is obtained from patient and review of electronic medical records and personally reviewed pertinent imaging films in PACS.  She has past medical history of hypertension, hyperlipidemia, obesity, COPD who presented with 1 and half weeks of shortness of breath and leg swelling on 10/23/2020.  Primary care physician had stopped hydrochlorothiazide due to increasing leg swellings.  Patient developed respiratory distress and was intubated.  She developed some slurred speech and involuntary rhythmic movements of right greater than left upper extremity and some periods of confusion and disorientation possibly from hypoxia.  MRI scan of the brain was obtained which showed tiny punctate left frontal white matter infarct which is felt to be clinically silent and incidental.  Carotid ultrasound showed 40 to 59% left ICA and 1-39% right ICA stenosis.  2D echo showed ejection fraction 65 to 70%.  LDL cholesterol was elevated at 105 mg percent hemoglobin A1c was borderline at 5.9.  EEG showed diffuse generalized slowing without epileptiform activity.  Patient was started on aspirin for stroke prevention.  She states she is doing well since discharge exam no recurrent stroke or TIA symptoms.  She is tolerating aspirin well without bruising or bleeding.  Her blood pressure at home is quite good controlled but today it is elevated in office at 159/72.  Is tolerating Crestor well without muscle aches and pains.  She does use a wheeled walker for ambulation but  no further neurological issues.  There is no prior history of strokes TIA ,seizures, migraines or neurological problems.    ROS:   14 system review of systems is positive for neck stiffness, all other systems negative  PMH:  Past Medical History:  Diagnosis Date   Colon polyp     COPD (chronic obstructive pulmonary disease) (HCC)    MILD   Hyperlipidemia    Hypertension    OA (osteoarthritis)    OF THE KNEES   Osteopenia    Shingles    Stroke [facility])    Thyroid cyst     Social History:  Social History   Socioeconomic History   Marital status: Widowed    Spouse name: Not on file   Number of children: Not on file   Years of education: Not on file   Highest education level: Not on file  Occupational History   Not on file  Tobacco Use   Smoking status: Former   Smokeless tobacco: Never  Vaping Use   Vaping Use: Never used  Substance and Sexual Activity   Alcohol use: Not Currently   Drug use: Never   Sexual activity: Not on file  Other Topics Concern   Not on file  Social History Narrative   Lives alone   Right Handed   Drinks decaf   Social Determinants of Health   Financial Resource Strain: Not on file  Food Insecurity: Not on file  Transportation Needs: Not on file  Physical Activity: Not on file  Stress: Not on file  Social Connections: Not on file  Intimate Partner Violence: Not on file    Medications:   Current Outpatient Medications on File Prior to Visit  Medication Sig Dispense Refill   amLODipine (NORVASC) 2.5 MG tablet Take 2.5 mg by mouth daily.     aspirin EC 81 MG EC tablet Take 1 tablet (81 mg total) by mouth daily. Swallow whole. 30 tablet 11   Cholecalciferol (VITAMIN D PO) Take 1,000 Units by mouth daily.     docusate sodium (COLACE) 100 MG capsule Take 1 capsule (100 mg total) by mouth daily as needed for mild constipation. 60 capsule 0   furosemide (LASIX) 20 MG tablet 1 tablet     rosuvastatin (CRESTOR) 10 MG tablet Take 10 mg by mouth daily.     No current facility-administered medications on file prior to visit.    Allergies:   Allergies  Allergen Reactions   Atorvastatin     Other reaction(s): soreness   Ceftin [Cefuroxime] Diarrhea   Cefuroxime Axetil     Other reaction(s): severe diarrhea   Clindamycin  Hcl     Other reaction(s): itching   Clindamycin/Lincomycin Itching   Moxifloxacin     Other reaction(s): itching Other reaction(s): itching Other reaction(s): itching Other reaction(s): itching   Simvastatin     Muscle pain with large dose Other reaction(s): myalgia with higher doses   Doxycycline Hyclate Diarrhea and Rash    Other reaction(s): rash, diarrhea   Penicillins Rash    Physical Exam There were no vitals filed for this visit.  There is no height or weight on file to calculate BMI.   General: obese very pleasant elderly african american lady, seated, in no evident distress Head: head normocephalic and atraumatic.  Neck: supple with no carotid or supraclavicular bruits Cardiovascular: regular rate and rhythm, no murmurs Musculoskeletal: tenderness upon left-sided sternocleidomastoid palpation and upper trapezius Skin:  no rash/petichiae Vascular:  Normal pulses all extremities  Neurologic  Exam Mental Status: Awake and fully alert. Fluent speech and language. Oriented to place and time. Recent and remote memory intact. Attention span, concentration and fund of knowledge appropriate. Mood and affect appropriate.  Cranial Nerves: Pupils equal, briskly reactive to light. Extraocular movements full without nystagmus.  But has mild exotropia of the left eye visual fields full to confrontation. Hearing intact. Facial sensation intact. Face, tongue, palate moves normally and symmetrically.  Motor: Normal bulk and tone. Normal strength in all tested extremity muscles. Sensory.: intact to touch ,pinprick .position and vibratory sensation.  Coordination: Rapid alternating movements normal in all extremities. Finger-to-nose and heel-to-shin performed accurately bilaterally. Gait and Station: deferred as RW not present  Reflexes: 1+ and symmetric. Toes downgoing.        ASSESSMENT: 88 year old African-American lady with silent small left frontal white matter lacunar infarct  from small vessel disease in March 2022 without residual deficits discovered during hospitalization for respiratory failure and CHF.  Vascular risk factors of hyperlipidemia, hypertension, carotid stenosis, obesity and CHF    Lacunar Infarct:  Continue aspirin 81 mg daily and Crestor 20 mg daily for secondary stroke prevention.  Discussed secondary stroke prevention measures and importance of close PCP follow up for aggressive stroke risk factor management including BP goal<130/90, and HLD with LDL goal<70 LDL 93 (08/2021) -increased Crestor from 5 mg to 20 mg daily, repeat LDL 91 (12/2021)  Left sided neck stiffness: appears musculoskeletal. No red flags - reassured low suspicion related to carotid artery stenosis.  Discussed use of gentle stretching, use of heat, good pillow support when sleeping and good posture. Can continue to be monitored by PCP or ortho Carotid stenosis:  Carotid ultrasound 02/2022 bilateral 40 to 59% stenosis Carotid ultrasound 07/2021  b/l ICA 40 to 59% stenosis;  Carotid ultrasound 10/2020 showed R ICA 40 to 59% stenosis and left ICA 1 to 39% stenosis Plan on repeat carotid ultrasound 07/2022. If remains stable, can have yearly carotid ultrasounds managed/monitored by PCP   Follow up in 6 months or call earlier if needed    CC:  Shirline Frees, MD   I spent 31 minutes of face-to-face and non-face-to-face time with patient.  This included previsit chart review, lab review, study review, order entry, electronic health record documentation, patient education and discussion regarding history of prior stroke including secondary stroke prevention measures and aggressive stroke risk factor management, carotid stenosis and surveillance monitoring, left-sided neck stiffness and conservative measures and answered all other questions to patient satisfaction  Frann Rider, AGNP-BC  [facility] [address] [place], [place] [address]  Phone  [phone] Fax [phone] Note: This document was prepared with digital dictation and possible smart phrase technology. Any transcriptional errors that result from this process are unintentional.

## 2022-03-08 ENCOUNTER — Encounter: Payer: Self-pay | Admitting: Adult Health

## 2022-03-08 ENCOUNTER — Ambulatory Visit: Payer: Medicare Other | Admitting: Adult Health

## 2022-03-08 VITALS — BP 146/72 | HR 92 | Ht 59.0 in

## 2022-03-08 DIAGNOSIS — I6523 Occlusion and stenosis of bilateral carotid arteries: Secondary | ICD-10-CM | POA: Diagnosis not present

## 2022-03-08 DIAGNOSIS — E785 Hyperlipidemia, unspecified: Secondary | ICD-10-CM

## 2022-03-08 DIAGNOSIS — I6381 Other cerebral infarction due to occlusion or stenosis of small artery: Secondary | ICD-10-CM | POA: Diagnosis not present

## 2022-03-08 MED ORDER — ROSUVASTATIN CALCIUM 20 MG PO TABS: 20.0000 mg | ORAL_TABLET | Freq: Every day | ORAL | 5 refills | Status: DC

## 2022-03-08 NOTE — Patient Instructions (Addendum)
Continue aspirin 81 mg daily  and increase Crestor to '20mg'$  daily for secondary stroke prevention - if any difficulty tolerating at increased dose, please let myself or Dr. Kenton Kingfisher know. May need to consider adding an additional agent such as Zetia.   We will plan on repeat carotid ultrasound around January -if you do not receive a phone call in January to schedule, please let us know  Continue to follow up with PCP regarding cholesterol and blood pressure management  Maintain strict control of hypertension with blood pressure goal below 130/90 and cholesterol with LDL cholesterol (bad cholesterol) goal below 70 mg/dL.   Signs of a Stroke? Follow the BEFAST method:  Balance Watch for a sudden loss of balance, trouble with coordination or vertigo Eyes Is there a sudden loss of vision in one or both eyes? Or double vision?  Face: Ask the person to smile. Does one side of the face droop or is it numb?  Arms: Ask the person to raise both arms. Does one arm drift downward? Is there weakness or numbness of a leg? Speech: Ask the person to repeat a simple phrase. Does the speech sound slurred/strange? Is the person confused ? Time: If you observe any of these signs, call 911.     Followup in the future with me in 6 months or call earlier if needed       Thank you for coming to see [place] at [facility]. I hope we have been able to provide you high quality care today.  You may receive a patient satisfaction survey over the next few weeks. We would appreciate your feedback and comments so that we may continue to improve ourselves and the health of our patients.   High Cholesterol  High cholesterol is a condition in which the blood has high levels of a white, waxy substance similar to fat (cholesterol). The liver makes all the cholesterol that the body needs. The human body needs small amounts of cholesterol to help build cells. A person gets extra or excess cholesterol from the  food that he or she eats. The blood carries cholesterol from the liver to the rest of the body. If you have high cholesterol, deposits (plaques) may build up on the walls of your arteries. Arteries are the blood vessels that carry blood away from your heart. These plaques make the arteries narrow and stiff. Cholesterol plaques increase your risk for heart attack and stroke. Work with your health care provider to keep your cholesterol levels in a healthy range. What increases the risk? The following factors may make you more likely to develop this condition: Eating foods that are high in animal fat (saturated fat) or cholesterol. Being overweight. Not getting enough exercise. A family history of high cholesterol (familial hypercholesterolemia). Use of tobacco products. Having diabetes. What are the signs or symptoms? In most cases, high cholesterol does not usually cause any symptoms. In severe cases, very high cholesterol levels can cause: Fatty bumps under the skin (xanthomas). A white or gray ring around the black center (pupil) of the eye. How is this diagnosed? This condition may be diagnosed based on the results of a blood test. If you are older than 86 years of age, your health care provider may check your cholesterol levels every 4-6 years. You may be checked more often if you have high cholesterol or other risk factors for heart disease. The blood test for cholesterol measures: "Bad" cholesterol, or LDL cholesterol. This is the main type of  cholesterol that causes heart disease. The desired level is less than 100 mg/dL (2.59 mmol/L). "Good" cholesterol, or HDL cholesterol. HDL helps protect against heart disease by cleaning the arteries and carrying the LDL to the liver for processing. The desired level for HDL is 60 mg/dL (1.55 mmol/L) or higher. Triglycerides. These are fats that your body can store or burn for energy. The desired level is less than 150 mg/dL (1.69 mmol/L). Total  cholesterol. This measures the total amount of cholesterol in your blood and includes LDL, HDL, and triglycerides. The desired level is less than 200 mg/dL (5.17 mmol/L). How is this treated? Treatment for high cholesterol starts with lifestyle changes, such as diet and exercise. Diet changes. You may be asked to eat foods that have more fiber and less saturated fats or added sugar. Lifestyle changes. These may include regular exercise, maintaining a healthy weight, and quitting use of tobacco products. Medicines. These are given when diet and lifestyle changes have not worked. You may be prescribed a statin medicine to help lower your cholesterol levels. Follow these instructions at home: Eating and drinking  Eat a healthy, balanced diet. This diet includes: Daily servings of a variety of fresh, frozen, or canned fruits and vegetables. Daily servings of whole grain foods that are rich in fiber. Foods that are low in saturated fats and trans fats. These include poultry and fish without skin, lean cuts of meat, and low-fat dairy products. A variety of fish, especially oily fish that contain omega-3 fatty acids. Aim to eat fish at least 2 times a week. Avoid foods and drinks that have added sugar. Use healthy cooking methods, such as roasting, grilling, broiling, baking, poaching, steaming, and stir-frying. Do not fry your food except for stir-frying. If you drink alcohol: Limit how much you have to: 0-1 drink a day for women who are not pregnant. 0-2 drinks a day for men. Know how much alcohol is in a drink. In the [place] one drink equals one 12 oz bottle of beer (355 mL), one 5 oz glass of wine (148 mL), or one 1 oz glass of hard liquor (44 mL). Lifestyle  Get regular exercise. Aim to exercise for a total of 150 minutes a week. Increase your activity level by doing activities such as gardening, walking, and taking the stairs. Do not use any products that contain nicotine or tobacco. These  products include cigarettes, chewing tobacco, and vaping devices, such as e-cigarettes. If you need help quitting, ask your health care provider. General instructions Take over-the-counter and prescription medicines only as told by your health care provider. Keep all follow-up visits. This is important. Where to find more information American Heart Association: www.heart.org National Heart, Lung, and Blood Institute: https://wilson-eaton.com/ Contact a health care provider if: You have trouble achieving or maintaining a healthy diet or weight. You are starting an exercise program. You are unable to stop smoking. Get help right away if: You have chest pain. You have trouble breathing. You have discomfort or pain in your jaw, neck, back, shoulder, or arm. You have any symptoms of a stroke. "BE FAST" is an easy way to remember the main warning signs of a stroke: B - Balance. Signs are dizziness, sudden trouble walking, or loss of balance. E - Eyes. Signs are trouble seeing or a sudden change in vision. F - Face. Signs are sudden weakness or numbness of the face, or the face or eyelid drooping on one side. A - Arms. Signs are weakness or numbness  in an arm. This happens suddenly and usually on one side of the body. S - Speech. Signs are sudden trouble speaking, slurred speech, or trouble understanding what people say. T - Time. Time to call emergency services. Write down what time symptoms started. You have other signs of a stroke, such as: A sudden, severe headache with no known cause. Nausea or vomiting. Seizure. These symptoms may represent a serious problem that is an emergency. Do not wait to see if the symptoms will go away. Get medical help right away. Call your local emergency services (911 in the [place] Do not drive yourself to the hospital. Summary Cholesterol plaques increase your risk for heart attack and stroke. Work with your health care provider to keep your cholesterol levels in a  healthy range. Eat a healthy, balanced diet, get regular exercise, and maintain a healthy weight. Do not use any products that contain nicotine or tobacco. These products include cigarettes, chewing tobacco, and vaping devices, such as e-cigarettes. Get help right away if you have any symptoms of a stroke. This information is not intended to replace advice given to you by your health care provider. Make sure you discuss any questions you have with your health care provider. Document Revised: 10/13/2020 Document Reviewed: 10/03/2020 Elsevier Patient Education  [place].     Cholesterol Content in Foods Cholesterol is a waxy, fat-like substance that helps to carry fat in the blood. The body needs cholesterol in small amounts, but too much cholesterol can cause damage to the arteries and heart. What foods have cholesterol?  Cholesterol is found in animal-based foods, such as meat, seafood, and dairy. Generally, low-fat dairy and lean meats have less cholesterol than full-fat dairy and fatty meats. The milligrams of cholesterol per serving (mg per serving) of common cholesterol-containing foods are listed below. Meats and other proteins Egg -- one large whole egg has 186 mg. Veal shank -- 4 oz (113 g) has 141 mg. Lean ground [place] (93% lean) -- 4 oz (113 g) has 118 mg. Fat-trimmed lamb loin -- 4 oz (113 g) has 106 mg. Lean ground beef (90% lean) -- 4 oz (113 g) has 100 mg. Lobster -- 3.5 oz (99 g) has 90 mg. Pork loin chops -- 4 oz (113 g) has 86 mg. Canned salmon -- 3.5 oz (99 g) has 83 mg. Fat-trimmed beef top loin -- 4 oz (113 g) has 78 mg. Frankfurter -- 1 frank (3.5 oz or 99 g) has 77 mg. Crab -- 3.5 oz (99 g) has 71 mg. Roasted chicken without skin, white meat -- 4 oz (113 g) has 66 mg. Light bologna -- 2 oz (57 g) has 45 mg. Deli-cut [place] -- 2 oz (57 g) has 31 mg. Canned tuna -- 3.5 oz (99 g) has 31 mg. Berniece Salines -- 1 oz (28 g) has 29 mg. Oysters and mussels (raw) -- 3.5  oz (99 g) has 25 mg. Mackerel -- 1 oz (28 g) has 22 mg. Trout -- 1 oz (28 g) has 20 mg. Pork sausage -- 1 link (1 oz or 28 g) has 17 mg. Salmon -- 1 oz (28 g) has 16 mg. Tilapia -- 1 oz (28 g) has 14 mg. Dairy Soft-serve ice cream --  cup (4 oz or 86 g) has 103 mg. Whole-milk yogurt -- 1 cup (8 oz or 245 g) has 29 mg. Cheddar cheese -- 1 oz (28 g) has 28 mg. American cheese -- 1 oz (28 g) has 28 mg. Whole  milk -- 1 cup (8 oz or 250 mL) has 23 mg. 2% milk -- 1 cup (8 oz or 250 mL) has 18 mg. Cream cheese -- 1 tablespoon (Tbsp) (14.5 g) has 15 mg. Cottage cheese --  cup (4 oz or 113 g) has 14 mg. Low-fat (1%) milk -- 1 cup (8 oz or 250 mL) has 10 mg. Sour cream -- 1 Tbsp (12 g) has 8.5 mg. Low-fat yogurt -- 1 cup (8 oz or 245 g) has 8 mg. Nonfat Greek yogurt -- 1 cup (8 oz or 228 g) has 7 mg. Half-and-half cream -- 1 Tbsp (15 mL) has 5 mg. Fats and oils Cod liver oil -- 1 tablespoon (Tbsp) (13.6 g) has 82 mg. Butter -- 1 Tbsp (14 g) has 15 mg. Lard -- 1 Tbsp (12.8 g) has 14 mg. Bacon grease -- 1 Tbsp (12.9 g) has 14 mg. Mayonnaise -- 1 Tbsp (13.8 g) has 5-10 mg. Margarine -- 1 Tbsp (14 g) has 3-10 mg. The items listed above may not be a complete list of foods with cholesterol. Exact amounts of cholesterol in these foods may vary depending on specific ingredients and brands. Contact a dietitian for more information. What foods do not have cholesterol? Most plant-based foods do not have cholesterol unless you combine them with a food that has cholesterol. Foods without cholesterol include: Grains and cereals. Vegetables. Fruits. Vegetable oils, such as olive, canola, and sunflower oil. Legumes, such as peas, beans, and lentils. Nuts and seeds. Egg whites. The items listed above may not be a complete list of foods that do not have cholesterol. Contact a dietitian for more information. Summary The body needs cholesterol in small amounts, but too much cholesterol can cause damage to  the arteries and heart. Cholesterol is found in animal-based foods, such as meat, seafood, and dairy. Generally, low-fat dairy and lean meats have less cholesterol than full-fat dairy and fatty meats. This information is not intended to replace advice given to you by your health care provider. Make sure you discuss any questions you have with your health care provider. Document Revised: 12/09/2020 Document Reviewed: 12/09/2020 Elsevier Patient Education  [place].

## 2022-03-19 ENCOUNTER — Inpatient Hospital Stay (HOSPITAL_COMMUNITY)
Admission: EM | Admit: 2022-03-19 | Discharge: 2022-03-23 | DRG: 189 | Disposition: A | Discharge status: Home / Self Care | Payer: Medicare Other | Attending: Internal Medicine | Admitting: Internal Medicine

## 2022-03-19 ENCOUNTER — Other Ambulatory Visit: Payer: Self-pay

## 2022-03-19 ENCOUNTER — Emergency Department (HOSPITAL_COMMUNITY): Payer: Medicare Other

## 2022-03-19 DIAGNOSIS — Z881 Allergy status to other antibiotic agents status: Secondary | ICD-10-CM | POA: Diagnosis not present

## 2022-03-19 DIAGNOSIS — Z87891 Personal history of nicotine dependence: Secondary | ICD-10-CM

## 2022-03-19 DIAGNOSIS — E872 Acidosis, unspecified: Secondary | ICD-10-CM | POA: Diagnosis present

## 2022-03-19 DIAGNOSIS — Z880 Allergy status to penicillin: Secondary | ICD-10-CM

## 2022-03-19 DIAGNOSIS — M1990 Unspecified osteoarthritis, unspecified site: Secondary | ICD-10-CM | POA: Diagnosis present

## 2022-03-19 DIAGNOSIS — Z8616 Personal history of COVID-19: Secondary | ICD-10-CM | POA: Diagnosis not present

## 2022-03-19 DIAGNOSIS — Z8673 Personal history of transient ischemic attack (TIA), and cerebral infarction without residual deficits: Secondary | ICD-10-CM | POA: Diagnosis not present

## 2022-03-19 DIAGNOSIS — J9621 Acute and chronic respiratory failure with hypoxia: Secondary | ICD-10-CM | POA: Diagnosis present

## 2022-03-19 DIAGNOSIS — J9601 Acute respiratory failure with hypoxia: Secondary | ICD-10-CM | POA: Diagnosis present

## 2022-03-19 DIAGNOSIS — E8770 Fluid overload, unspecified: Secondary | ICD-10-CM | POA: Diagnosis not present

## 2022-03-19 DIAGNOSIS — Z79899 Other long term (current) drug therapy: Secondary | ICD-10-CM | POA: Diagnosis not present

## 2022-03-19 DIAGNOSIS — Z888 Allergy status to other drugs, medicaments and biological substances status: Secondary | ICD-10-CM

## 2022-03-19 DIAGNOSIS — I509 Heart failure, unspecified: Secondary | ICD-10-CM

## 2022-03-19 DIAGNOSIS — E875 Hyperkalemia: Secondary | ICD-10-CM | POA: Diagnosis not present

## 2022-03-19 DIAGNOSIS — J9622 Acute and chronic respiratory failure with hypercapnia: Secondary | ICD-10-CM | POA: Diagnosis present

## 2022-03-19 DIAGNOSIS — Z8249 Family history of ischemic heart disease and other diseases of the circulatory system: Secondary | ICD-10-CM

## 2022-03-19 DIAGNOSIS — E785 Hyperlipidemia, unspecified: Secondary | ICD-10-CM | POA: Diagnosis present

## 2022-03-19 DIAGNOSIS — J449 Chronic obstructive pulmonary disease, unspecified: Secondary | ICD-10-CM | POA: Diagnosis present

## 2022-03-19 DIAGNOSIS — I10 Essential (primary) hypertension: Secondary | ICD-10-CM | POA: Diagnosis present

## 2022-03-19 DIAGNOSIS — Z86010 Personal history of colonic polyps: Secondary | ICD-10-CM

## 2022-03-19 DIAGNOSIS — M8580 Other specified disorders of bone density and structure, unspecified site: Secondary | ICD-10-CM | POA: Diagnosis present

## 2022-03-19 DIAGNOSIS — J9602 Acute respiratory failure with hypercapnia: Secondary | ICD-10-CM

## 2022-03-19 DIAGNOSIS — Z6841 Body Mass Index (BMI) 40.0 and over, adult: Secondary | ICD-10-CM | POA: Diagnosis not present

## 2022-03-19 DIAGNOSIS — Z7982 Long term (current) use of aspirin: Secondary | ICD-10-CM | POA: Diagnosis not present

## 2022-03-19 DIAGNOSIS — J9611 Chronic respiratory failure with hypoxia: Secondary | ICD-10-CM | POA: Diagnosis present

## 2022-03-19 LAB — CBC WITH DIFFERENTIAL/PLATELET
Abs Immature Granulocytes: 0.1 10*3/uL — ABNORMAL HIGH (ref 0.00–0.07)
Basophils Absolute: 0 10*3/uL (ref 0.0–0.1)
Basophils Relative: 0 %
Eosinophils Absolute: 0 10*3/uL (ref 0.0–0.5)
Eosinophils Relative: 0 %
HCT: 51.2 % — ABNORMAL HIGH (ref 36.0–46.0)
Hemoglobin: 15.9 g/dL — ABNORMAL HIGH (ref 12.0–15.0)
Immature Granulocytes: 1 %
Lymphocytes Relative: 14 %
Lymphs Abs: 1.5 10*3/uL (ref 0.7–4.0)
MCH: 29.2 pg (ref 26.0–34.0)
MCHC: 31.1 g/dL (ref 30.0–36.0)
MCV: 93.9 fL (ref 80.0–100.0)
Monocytes Absolute: 0.7 10*3/uL (ref 0.1–1.0)
Monocytes Relative: 7 %
Neutro Abs: 8.5 10*3/uL — ABNORMAL HIGH (ref 1.7–7.7)
Neutrophils Relative %: 78 %
Platelets: 199 10*3/uL (ref 150–400)
RBC: 5.45 MIL/uL — ABNORMAL HIGH (ref 3.87–5.11)
RDW: 14.9 % (ref 11.5–15.5)
WBC: 10.8 10*3/uL — ABNORMAL HIGH (ref 4.0–10.5)
nRBC: 0 % (ref 0.0–0.2)

## 2022-03-19 LAB — COMPREHENSIVE METABOLIC PANEL
ALT: 19 U/L (ref 0–44)
AST: 28 U/L (ref 15–41)
Albumin: 4 g/dL (ref 3.5–5.0)
Alkaline Phosphatase: 58 U/L (ref 38–126)
Anion gap: 7 (ref 5–15)
BUN: 23 mg/dL (ref 8–23)
CO2: 33 mmol/L — ABNORMAL HIGH (ref 22–32)
Calcium: 8.8 mg/dL — ABNORMAL LOW (ref 8.9–10.3)
Chloride: 93 mmol/L — ABNORMAL LOW (ref 98–111)
Creatinine, Ser: 0.95 mg/dL (ref 0.44–1.00)
GFR, Estimated: 58 mL/min — ABNORMAL LOW (ref >60)
Glucose, Bld: 129 mg/dL — ABNORMAL HIGH (ref 70–99)
Potassium: 4.9 mmol/L (ref 3.5–5.1)
Sodium: 133 mmol/L — ABNORMAL LOW (ref 135–145)
Total Bilirubin: 0.5 mg/dL (ref 0.3–1.2)
Total Protein: 7.6 g/dL (ref 6.5–8.1)

## 2022-03-19 LAB — TROPONIN I (HIGH SENSITIVITY)
Troponin I (High Sensitivity): 17 ng/L (ref <18)
Troponin I (High Sensitivity): 26 ng/L — ABNORMAL HIGH (ref <18)

## 2022-03-19 LAB — I-STAT ARTERIAL BLOOD GAS, ED
Acid-Base Excess: 4 mmol/L — ABNORMAL HIGH (ref 0.0–2.0)
Bicarbonate: 37.4 mmol/L — ABNORMAL HIGH (ref 20.0–28.0)
Calcium, Ion: 1.23 mmol/L (ref 1.15–1.40)
HCT: 49 % — ABNORMAL HIGH (ref 36.0–46.0)
Hemoglobin: 16.7 g/dL — ABNORMAL HIGH (ref 12.0–15.0)
O2 Saturation: 93 %
Patient temperature: 98.6
Potassium: 4.6 mmol/L (ref 3.5–5.1)
Sodium: 133 mmol/L — ABNORMAL LOW (ref 135–145)
TCO2: 40 mmol/L — ABNORMAL HIGH (ref 22–32)
pCO2 arterial: 100.1 mmHg (ref 32–48)
pH, Arterial: 7.181 — CL (ref 7.35–7.45)
pO2, Arterial: 89 mmHg (ref 83–108)

## 2022-03-19 LAB — I-STAT VENOUS BLOOD GAS, ED
Acid-Base Excess: 5 mmol/L — ABNORMAL HIGH (ref 0.0–2.0)
Acid-Base Excess: 8 mmol/L — ABNORMAL HIGH (ref 0.0–2.0)
Bicarbonate: 35.8 mmol/L — ABNORMAL HIGH (ref 20.0–28.0)
Bicarbonate: 40 mmol/L — ABNORMAL HIGH (ref 20.0–28.0)
Calcium, Ion: 1.03 mmol/L — ABNORMAL LOW (ref 1.15–1.40)
Calcium, Ion: 1.09 mmol/L — ABNORMAL LOW (ref 1.15–1.40)
HCT: 53 % — ABNORMAL HIGH (ref 36.0–46.0)
HCT: 47 % — ABNORMAL HIGH (ref 36.0–46.0)
Hemoglobin: 18 g/dL — ABNORMAL HIGH (ref 12.0–15.0)
Hemoglobin: 16 g/dL — ABNORMAL HIGH (ref 12.0–15.0)
O2 Saturation: 99 %
O2 Saturation: 61 %
Potassium: 4.6 mmol/L (ref 3.5–5.1)
Potassium: 4.7 mmol/L (ref 3.5–5.1)
Sodium: 132 mmol/L — ABNORMAL LOW (ref 135–145)
Sodium: 133 mmol/L — ABNORMAL LOW (ref 135–145)
TCO2: 38 mmol/L — ABNORMAL HIGH (ref 22–32)
TCO2: 43 mmol/L — ABNORMAL HIGH (ref 22–32)
pCO2, Ven: 77.6 mmHg (ref 44–60)
pCO2, Ven: 96.8 mmHg (ref 44–60)
pH, Ven: 7.272 (ref 7.25–7.43)
pH, Ven: 7.224 — ABNORMAL LOW (ref 7.25–7.43)
pO2, Ven: 140 mmHg — ABNORMAL HIGH (ref 32–45)
pO2, Ven: 40 mmHg (ref 32–45)

## 2022-03-19 LAB — BRAIN NATRIURETIC PEPTIDE: B Natriuretic Peptide: 376.4 pg/mL — ABNORMAL HIGH (ref 0.0–100.0)

## 2022-03-19 MED ORDER — DOCUSATE SODIUM 100 MG PO CAPS: 100.0000 mg | ORAL_CAPSULE | Freq: Two times a day (BID) | ORAL | Status: DC | PRN

## 2022-03-19 MED ORDER — HEPARIN SODIUM (PORCINE) 5000 UNIT/ML IJ SOLN
5000.0000 [IU] | Freq: Three times a day (TID) | INTRAMUSCULAR | Status: DC
  Administered 2022-03-20 – 2022-03-23 (×10): 5000 [IU] via SUBCUTANEOUS
  Filled 2022-03-19 (×11): qty 1

## 2022-03-19 MED ORDER — POLYETHYLENE GLYCOL 3350 17 G PO PACK: 17.0000 g | PACK | Freq: Every day | ORAL | Status: DC | PRN

## 2022-03-19 MED ORDER — FUROSEMIDE 10 MG/ML IJ SOLN
40.0000 mg | Freq: Once | INTRAMUSCULAR | Status: AC
  Administered 2022-03-19: 40 mg via INTRAVENOUS
  Filled 2022-03-19: qty 4

## 2022-03-19 NOTE — ED Notes (Addendum)
Pt tolerating bipap, remains lethargic. Axo x4, NAD noted at this time

## 2022-03-19 NOTE — H&P (Incomplete)
NAME:  [name], MRN:  [id number], DOB:  1933/05/07, LOS: 0 ADMISSION DATE:  03/19/2022 CONSULTATION DATE:  03/19/2022 REFERRING MD:  Roslynn Amble - EDP CHIEF COMPLAINT:  SOB, hypercarbia   History of Present Illness:  88 year old woman who presented to [facility] ED 8/7 for SOB, LE edema. PMHx significant for HTN, HLD, COPD, prior CVA without residual deficits (lacunar infarct 10/2020), thyroid cyst, OA.  Patient initially presented to [facility] ED from her PCP office via EMS with complaints of SOB, LE edema and AMS.  SpO2 noted to be 74% on RA, improved to 88% on 6LNC.  Nebulizer administered with improvement in O2 sat to 98%; unfortunately SpO2 remained labile and NRB was applied. Subsequently placed on BiPAP with improved O2 sats and decreased WOB. VBG obtained with pCO2 77.6. ABG subsequently obtained with pCO2 100.1. CXR with vascular congestion and ?interstitial edema.  Repeat VBG with pCO2 96.8 despite BiPAP and mild AMS/confusion. PCCM consulted for admission for persistent hypercarbia.  Pertinent Medical History:   Past Medical History:  Diagnosis Date   Colon polyp    COPD (chronic obstructive pulmonary disease) ([place])    MILD   Hyperlipidemia    Hypertension    OA (osteoarthritis)    OF THE KNEES   Osteopenia    Shingles    Stroke [facility])    Thyroid cyst    Significant Hospital Events: Including procedures, antibiotic start and stop dates in addition to other pertinent events   8/7 - Presented to [facility] ED for SOB, LE edema. Found to be mildly acidotic and hypercarbic. CXR with mild congestion, ?interstitial edema. Lasix '40mg'$  IV given, BiPAP initiated. PCCM consulted for admission in the setting of continuous BiPAP/persistent hypercarbia.  Interim History / Subjective:  PCCM consulted for persistent hypercarbia  Objective:  Blood pressure 128/67, pulse 69, temperature 98.9 F (37.2 C), resp. rate 16, SpO2 96 %.    FiO2 (%):  [50 %] 50 %  No intake or output data in the 24 hours ending  03/19/22 2016 There were no vitals filed for this visit.  Physical Examination: General: Chronically ill-appearing elderly woman in NAD. Sleeping comfortably on BiPAP. HEENT: [place]/AT, anicteric sclera, PERRL, dry mucous membranes. BiPAP mask in place. Neuro: Awake, oriented x 3. Responds to verbal stimuli. Following commands consistently. Moves all 4 extremities spontaneously.  CV: RRR, no m/g/r. PULM: Breathing even and unlabored on BiPAP. Lung fields diminished at bases bilaterally, faint basilar crackles. GI: Soft, nontender, nondistended. Normoactive bowel sounds. Extremities: Bilateral 2+ pitting LE edema noted. Skin: Cool/dry, no rashes.  Resolved Hospital Problem List:    Assessment & Plan:  Acute-on-chronic hypoxic and hypercarbic respiratory failure History of mild COPD - Continue supplemental O2 support - BiPAP continuous for now, wean as able to PRN - Wean O2 for sat > 90% - Bronchodilators as needed - Pulmonary hygiene - AM VBG vs. ABG - Daily CXR - May benefit from repeat PFTs  Concern for CHF Mildly elevated BNP to 376. Previous Echo 10/2020 with EF 65-70%, mild concentric LVH, indeterminate diastolic parameters. Lasix '20mg'$  daily at home, previously on HCTZ (did well, she's unsure why this was discontinued). Previously seen by Dr. Tamala Julian [facility]). - Diuresis as tolerated, repeat Lasix '40mg'$  IV - Monitor strict I&Os - Trend BNP - Repeat Echo - Consider Cards consult while in-house, has appt with Dr. Tamala Julian 8/28  HTN Home regimen includes Norvasc 2.'5mg'$  daily,Toprol XL '25mg'$  daily.  - Continue home antihypertensives, consider metoprolol tartrate while critically ill - Hydralazine IV  PRN for SBP > 150 - Cardiac monitoring  HLD Prior lacunar CVA - Continue ASA, statin  Best Practice: (right click and "Reselect all SmartList Selections" daily)   Diet/type: Regular consistency (see orders) DVT prophylaxis: prophylactic heparin , SCDs GI prophylaxis: N/A Lines:  N/A Foley:  N/A Code Status:  full code - previously partial code with DNI last admission, repeat discussion with patient/family 8/8AM Last date of multidisciplinary goals of care discussion [Pending]  Labs:  CBC: Recent Labs  Lab 03/19/22 1656 03/19/22 1707 03/19/22 1918  WBC 10.8*  --   --   NEUTROABS 8.5*  --   --   HGB 15.9* 18.0* 16.7*  HCT 51.2* 53.0* 49.0*  MCV 93.9  --   --   PLT 199  --   --    Basic Metabolic Panel: Recent Labs  Lab 03/19/22 1656 03/19/22 1707 03/19/22 1918  NA 133* 132* 133*  K 4.9 4.6 4.6  CL 93*  --   --   CO2 33*  --   --   GLUCOSE 129*  --   --   BUN 23  --   --   CREATININE 0.95  --   --   CALCIUM 8.8*  --   --    GFR: CrCl cannot be calculated (Unknown ideal weight.). Recent Labs  Lab 03/19/22 1656  WBC 10.8*   Liver Function Tests: Recent Labs  Lab 03/19/22 1656  AST 28  ALT 19  ALKPHOS 58  BILITOT 0.5  PROT 7.6  ALBUMIN 4.0   No results for input(s): "LIPASE", "AMYLASE" in the last 168 hours. No results for input(s): "AMMONIA" in the last 168 hours.  ABG:    Component Value Date/Time   PHART 7.181 (LL) 03/19/2022 1918   PCO2ART 100.1 (HH) 03/19/2022 1918   PO2ART 89 03/19/2022 1918   HCO3 37.4 (H) 03/19/2022 1918   TCO2 40 (H) 03/19/2022 1918   O2SAT 93 03/19/2022 1918   Coagulation Profile: No results for input(s): "INR", "PROTIME" in the last 168 hours.  Cardiac Enzymes: No results for input(s): "CKTOTAL", "CKMB", "CKMBINDEX", "TROPONINI" in the last 168 hours.  HbA1C: Hgb A1c MFr Bld  Date/Time Value Ref Range Status  10/24/2020 01:35 AM 5.9 (H) 4.8 - 5.6 % Final    Comment:    (NOTE) Pre diabetes:          5.7%-6.4%  Diabetes:              >6.4%  Glycemic control for   <7.0% adults with diabetes    CBG: No results for input(s): "GLUCAP" in the last 168 hours.  Review of Systems:   Review of systems completed with pertinent positives/negatives outlined in above HPI.  Past Medical History:   She,  has a past medical history of Colon polyp, COPD (chronic obstructive pulmonary disease) ([place]), Hyperlipidemia, Hypertension, OA (osteoarthritis), Osteopenia, Shingles, Stroke ([place]), and Thyroid cyst.   Surgical History:   Past Surgical History:  Procedure Laterality Date   BREAST EXCISIONAL BIOPSY Right    benign    Social History:   reports that she has quit smoking. She has never used smokeless tobacco. She reports that she does not currently use alcohol. She reports that she does not use drugs.   Family History:  Her family history includes CAD in her father. There is no history of Breast cancer.   Allergies: Allergies  Allergen Reactions   Atorvastatin     Other reaction(s): soreness   Ceftin [Cefuroxime] Diarrhea  Cefuroxime Axetil     Other reaction(s): severe diarrhea   Clindamycin Hcl     Other reaction(s): itching   Clindamycin/Lincomycin Itching   Moxifloxacin     Other reaction(s): itching Other reaction(s): itching Other reaction(s): itching Other reaction(s): itching   Simvastatin     Muscle pain with large dose Other reaction(s): myalgia with higher doses   Doxycycline Hyclate Diarrhea and Rash    Other reaction(s): rash, diarrhea   Penicillins Rash    Home Medications: Prior to Admission medications   Medication Sig Start Date End Date Taking? Authorizing Provider  amLODipine (NORVASC) 2.5 MG tablet Take 2.5 mg by mouth daily. 08/27/21   [provider]  aspirin EC 81 MG EC tablet Take 1 tablet (81 mg total) by mouth daily. Swallow whole. 10/27/20   Ghimire, Henreitta Leber, MD  Cholecalciferol (VITAMIN D PO) Take 1,000 Units by mouth daily.    [provider]  docusate sodium (COLACE) 100 MG capsule Take 1 capsule (100 mg total) by mouth daily as needed for mild constipation. 11/04/20   Love, Ivan Anchors, PA-C  furosemide (LASIX) 20 MG tablet 1 tablet 11/09/20   [provider]  Metoprolol Succinate 25 MG CS24 Take by mouth.     [provider]  rosuvastatin (CRESTOR) 20 MG tablet Take 1 tablet (20 mg total) by mouth daily. 03/08/22   Frann Rider, NP    Critical care time: [address] minutes   Lestine Mount, [place] [facility] 03/19/22 8:16 PM  Please see [email or web address] for pager details.  From 7A-7P if no response, please call [phone] After hours, please call ELink [phone]

## 2022-03-19 NOTE — ED Triage Notes (Addendum)
[facility] picked up at PCP office. Coming in for SOB, edema and ams. States sob and edema x 2 weeks but gotten worse.   74% on RA. 88% on 6L o2. Neb up to 98%

## 2022-03-19 NOTE — ED Notes (Signed)
ED Provider at bedside. 

## 2022-03-19 NOTE — Progress Notes (Signed)
Patient arrived by EMS from doctor's office.  RN had called RT due to patient having a decrease in sats on [facility].  Upon arrival, patient was on NRB with sats of 100%.  Patient was noted to have crackles throughout lung fields and noted to be lethargic with shallow respirations.  Patient was placed on bipap and is tolerating bipap well, however patient still lethargic.  Will continue to monitor.

## 2022-03-19 NOTE — ED Notes (Signed)
Pt placed on [place] at 6L o2 sat not above 90%. NRB applied

## 2022-03-19 NOTE — ED Provider Notes (Addendum)
[facility] EMERGENCY DEPARTMENT Provider Note   CSN: [id number] Arrival date & time: 03/19/22  1625     History  Chief Complaint  Patient presents with   Shortness of Breath   Altered Mental Status    [name] is a 88 y.o. female.  Presenting to ER from PCP office for shortness of breath.  Went to PCP for 2-week history of shortness of breath, leg swelling.  She states that she has had steadily worsening swelling in both of her legs.  Also having steadily worsening shortness of breath.  History is limited due to acuity, patient's respiratory distress.  No fever or chills or cough.  No chest pain.  HPI     Home Medications Prior to Admission medications   Medication Sig Start Date End Date Taking? Authorizing Provider  amLODipine (NORVASC) 2.5 MG tablet Take 2.5 mg by mouth daily. 08/27/21   [provider]  aspirin EC 81 MG EC tablet Take 1 tablet (81 mg total) by mouth daily. Swallow whole. 10/27/20   Ghimire, Henreitta Leber, MD  Cholecalciferol (VITAMIN D PO) Take 1,000 Units by mouth daily.    [provider]  docusate sodium (COLACE) 100 MG capsule Take 1 capsule (100 mg total) by mouth daily as needed for mild constipation. 11/04/20   Love, Ivan Anchors, PA-C  furosemide (LASIX) 20 MG tablet 1 tablet 11/09/20   [provider]  Metoprolol Succinate 25 MG CS24 Take by mouth.    [provider]  rosuvastatin (CRESTOR) 20 MG tablet Take 1 tablet (20 mg total) by mouth daily. 03/08/22   Frann Rider, NP      Allergies    Atorvastatin, Ceftin [cefuroxime], Cefuroxime axetil, Clindamycin hcl, Clindamycin/lincomycin, Moxifloxacin, Simvastatin, Doxycycline hyclate, and Penicillins    Review of Systems   Review of Systems  Respiratory:  Positive for shortness of breath.   Cardiovascular:  Positive for leg swelling.  All other systems reviewed and are negative.   Physical Exam Updated Vital Signs BP 117/60   Pulse 68   Temp  98.9 F (37.2 C)   Resp 18   SpO2 95%  Physical Exam Vitals and nursing note reviewed.  Constitutional:      Comments: Some increased work of breathing  HENT:     Head: Normocephalic and atraumatic.  Eyes:     Conjunctiva/sclera: Conjunctivae normal.  Cardiovascular:     Rate and Rhythm: Normal rate and regular rhythm.     Heart sounds: No murmur heard. Pulmonary:     Comments: Some increased work of breathing, diminished breath sounds at bases, speaks in short phrases due to work of breathing Abdominal:     Palpations: Abdomen is soft.     Tenderness: There is no abdominal tenderness.  Musculoskeletal:        General: No swelling.     Cervical back: Neck supple.     Comments: Bilateral pitting edema in lower legs extending to level of knee  Skin:    General: Skin is warm and dry.     Capillary Refill: Capillary refill takes less than 2 seconds.  Neurological:     Mental Status: She is alert.  Psychiatric:        Mood and Affect: Mood normal.     ED Results / Procedures / Treatments   Labs (all labs ordered are listed, but only abnormal results are displayed) Labs Reviewed  CBC WITH DIFFERENTIAL/PLATELET - Abnormal; Notable for the following components:  Result Value   WBC 10.8 (*)    RBC 5.45 (*)    Hemoglobin 15.9 (*)    HCT 51.2 (*)    Neutro Abs 8.5 (*)    Abs Immature Granulocytes 0.10 (*)    All other components within normal limits  COMPREHENSIVE METABOLIC PANEL - Abnormal; Notable for the following components:   Sodium 133 (*)    Chloride 93 (*)    CO2 33 (*)    Glucose, Bld 129 (*)    Calcium 8.8 (*)    GFR, Estimated 58 (*)    All other components within normal limits  BRAIN NATRIURETIC PEPTIDE - Abnormal; Notable for the following components:   B Natriuretic Peptide 376.4 (*)    All other components within normal limits  I-STAT VENOUS BLOOD GAS, ED - Abnormal; Notable for the following components:   pCO2, Ven 77.6 (*)    pO2, Ven 140 (*)     Bicarbonate 35.8 (*)    TCO2 38 (*)    Acid-Base Excess 5.0 (*)    Sodium 132 (*)    Calcium, Ion 1.03 (*)    HCT 53.0 (*)    Hemoglobin 18.0 (*)    All other components within normal limits  TROPONIN I (HIGH SENSITIVITY)  TROPONIN I (HIGH SENSITIVITY)    EKG EKG Interpretation  Date/Time:  Monday March 19 2022 16:34:11 EDT Ventricular Rate:  91 PR Interval:  158 QRS Duration: 80 QT Interval:  355 QTC Calculation: 437 R Axis:   36 Text Interpretation: Sinus rhythm Probable left atrial enlargement Confirmed by Madalyn Rob [phone]) on 03/19/2022 5:13:04 PM  Radiology DG Chest Portable 1 View  Result Date: 03/19/2022 CLINICAL DATA:  Shortness of breath EXAM: PORTABLE CHEST 1 VIEW COMPARISON:  10/23/2020 FINDINGS: Heart is normal size. Mild vascular congestion. Bilateral lower lobe airspace opacities. Diffuse interstitial prominence throughout the lungs. No acute bony abnormality. IMPRESSION: Mild vascular congestion and interstitial prominence which may reflect interstitial edema. Bibasilar atelectasis or infiltrates. Electronically Signed   By: Rolm Baptise M.D.   On: 03/19/2022 17:33    Procedures .Critical Care  Performed by: Lucrezia Starch, MD Authorized by: Lucrezia Starch, MD   Critical care provider statement:    Critical care time (minutes):  38   Critical care was necessary to treat or prevent imminent or life-threatening deterioration of the following conditions:  Respiratory failure   Critical care was time spent personally by me on the following activities:  Development of treatment plan with patient or surrogate, discussions with consultants, evaluation of patient's response to treatment, examination of patient, ordering and review of laboratory studies, ordering and review of radiographic studies, ordering and performing treatments and interventions, pulse oximetry, re-evaluation of patient's condition and review of old charts     Medications Ordered in  ED Medications  furosemide (LASIX) injection 40 mg (40 mg Intravenous Given 03/19/22 1826)    ED Course/ Medical Decision Making/ A&P                           Medical Decision Making Amount and/or Complexity of Data Reviewed Labs: ordered. Radiology: ordered.  Risk Prescription drug management. Decision regarding hospitalization.   88 year old F presenting to the ER due to concern for progressive leg swelling, shortness of breath.  Seen by her primary care doctor earlier today and EMS was called.  They report her initial oxygen saturation was very low, improved with nasal cannula and nebulizer.  On arrival to ER patient still having some increased work of breathing, noted significant pitting edema in her lower legs, no clear wheezing on my exam but did have some diminished breath at bases.  Started on BiPAP.  Initial labs concerning for elevated CO2 level consistent with hypercarbic respiratory failure.  BNP elevated, some congestion on CXR per my review and interpretation.  Suspect fluid overload state, provide Lasix.  Will admit to medicine for further management.  On reassessment she is doing much better and is tolerating the BiPAP well.  New ABG shows CO2 has worsened when compared to the original VBG.  Because on patient's clinical appearance her mental status seems stable and her respiratory status seems improved from prior I feel it would be more prudent to continue BiPAP at this time and monitor mental status closely.  I discussed with Dr. Cornelius Moras with the hospitalist service and he request that patient be admitted to the ICU for closer monitoring.  Have placed consult to CCM.      Final Clinical Impression(s) / ED Diagnoses Final diagnoses:  Acute respiratory failure with hypoxia and hypercapnia (HCC)  Hypervolemia, unspecified hypervolemia type    Rx / DC Orders ED Discharge Orders     None         Lucrezia Starch, MD 03/19/22 Randol Kern    Lucrezia Starch,  MD 03/19/22 1930

## 2022-03-19 NOTE — Progress Notes (Signed)
ABG results given to MD and RN.

## 2022-03-19 NOTE — Consult Note (Incomplete)
NAME:  [name], MRN:  [id number], DOB:  Aug 15, 1932, LOS: 0 ADMISSION DATE:  03/19/2022 CONSULTATION DATE:  03/19/2022 REFERRING MD:  Roslynn Amble - EDP CHIEF COMPLAINT:  SOB, hypercarbia   History of Present Illness:  88 year old woman who presented to [facility] ED 8/7 for SOB, LE edema. PMHx significant for HTN, HLD, COPD, prior CVA without residual deficits (lacunar infarct 10/2020), thyroid cyst, OA.  Patient initially presented to [facility] ED from her PCP office via EMS with complaints of SOB, LE edema and AMS.  SpO2 noted to be 74% on RA, improved to 88% on 6LNC.  Nebulizer administered with improvement in O2 sat to 98%; unfortunately SpO2 remained labile and NRB was applied. Subsequently placed on BiPAP with improved O2 sats and decreased WOB. VBG obtained with pCO2 77.6. ABG subsequently obtained with pCO2 100.1. CXR with vascular congestion and ?interstitial edema.  Repeat VBG with pCO2 96.8 despite BiPAP and mild AMS/confusion. PCCM consulted for admission for persistent hypercarbia.  Pertinent Medical History:   Past Medical History:  Diagnosis Date  . Colon polyp   . COPD (chronic obstructive pulmonary disease) (HCC)    MILD  . Hyperlipidemia   . Hypertension   . OA (osteoarthritis)    OF THE KNEES  . Osteopenia   . Shingles   . Stroke ([place])   . Thyroid cyst    Significant Hospital Events: Including procedures, antibiotic start and stop dates in addition to other pertinent events   8/7 - Presented to [facility] ED for SOB, LE edema. Found to be mildly acidotic and hypercarbic. CXR with mild congestion, ?interstitial edema. Lasix '40mg'$  IV given, BiPAP initiated. PCCM consulted for admission in the setting of continuous BiPAP/persistent hypercarbia.  Interim History / Subjective:  PCCM consulted for persistent hypercarbia  Objective:  Blood pressure 128/67, pulse 69, temperature 98.9 F (37.2 C), resp. rate 16, SpO2 96 %.    FiO2 (%):  [50 %] 50 %  No intake or output data in the 24 hours  ending 03/19/22 2016 There were no vitals filed for this visit.  Physical Examination: General: {SRACUITY:25313} ill-appearing *** in NAD. HEENT: [place]/AT, anicteric sclera, PERRL, moist mucous membranes. Neuro: {SRLOC:25308} {SRSTIMULI:25309} {SRCOMMANDS:25310} {SRNEUROEXTREMITIES:25312} Strength ***/5 in *** extremities. {SRRBRAINSTEM:25311}  CV: RRR, no m/g/r. PULM: Breathing even and unlabored on ***. Lung fields ***. GI: Soft, nontender, nondistended. Normoactive bowel sounds. Extremities: *** LE edema noted. Skin: Warm/dry, ***.  Resolved Hospital Problem List:    Assessment & Plan:  ***  Best Practice: (right click and "Reselect all SmartList Selections" daily)   Diet/type: Regular consistency (see orders) DVT prophylaxis: prophylactic heparin , SCDs GI prophylaxis: N/A Lines: N/A Foley:  N/A Code Status:  full code Last date of multidisciplinary goals of care discussion [Pending]  Labs:  CBC: Recent Labs  Lab 03/19/22 1656 03/19/22 1707 03/19/22 1918  WBC 10.8*  --   --   NEUTROABS 8.5*  --   --   HGB 15.9* 18.0* 16.7*  HCT 51.2* 53.0* 49.0*  MCV 93.9  --   --   PLT 199  --   --    Basic Metabolic Panel: Recent Labs  Lab 03/19/22 1656 03/19/22 1707 03/19/22 1918  NA 133* 132* 133*  K 4.9 4.6 4.6  CL 93*  --   --   CO2 33*  --   --   GLUCOSE 129*  --   --   BUN 23  --   --   CREATININE 0.95  --   --  CALCIUM 8.8*  --   --    GFR: CrCl cannot be calculated (Unknown ideal weight.). Recent Labs  Lab 03/19/22 1656  WBC 10.8*   Liver Function Tests: Recent Labs  Lab 03/19/22 1656  AST 28  ALT 19  ALKPHOS 58  BILITOT 0.5  PROT 7.6  ALBUMIN 4.0   No results for input(s): "LIPASE", "AMYLASE" in the last 168 hours. No results for input(s): "AMMONIA" in the last 168 hours.  ABG:    Component Value Date/Time   PHART 7.181 (LL) 03/19/2022 1918   PCO2ART 100.1 (HH) 03/19/2022 1918   PO2ART 89 03/19/2022 1918   HCO3 37.4 (H) 03/19/2022 1918    TCO2 40 (H) 03/19/2022 1918   O2SAT 93 03/19/2022 1918   Coagulation Profile: No results for input(s): "INR", "PROTIME" in the last 168 hours.  Cardiac Enzymes: No results for input(s): "CKTOTAL", "CKMB", "CKMBINDEX", "TROPONINI" in the last 168 hours.  HbA1C: Hgb A1c MFr Bld  Date/Time Value Ref Range Status  10/24/2020 01:35 AM 5.9 (H) 4.8 - 5.6 % Final    Comment:    (NOTE) Pre diabetes:          5.7%-6.4%  Diabetes:              >6.4%  Glycemic control for   <7.0% adults with diabetes    CBG: No results for input(s): "GLUCAP" in the last 168 hours.  Review of Systems:   Review of systems completed with pertinent positives/negatives outlined in above HPI.  Past Medical History:  She,  has a past medical history of Colon polyp, COPD (chronic obstructive pulmonary disease) ([place]), Hyperlipidemia, Hypertension, OA (osteoarthritis), Osteopenia, Shingles, Stroke ([place]), and Thyroid cyst.   Surgical History:   Past Surgical History:  Procedure Laterality Date  . BREAST EXCISIONAL BIOPSY Right    benign    Social History:   reports that she has quit smoking. She has never used smokeless tobacco. She reports that she does not currently use alcohol. She reports that she does not use drugs.   Family History:  Her family history includes CAD in her father. There is no history of Breast cancer.   Allergies: Allergies  Allergen Reactions  . Atorvastatin     Other reaction(s): soreness  . Ceftin [Cefuroxime] Diarrhea  . Cefuroxime Axetil     Other reaction(s): severe diarrhea  . Clindamycin Hcl     Other reaction(s): itching  . Clindamycin/Lincomycin Itching  . Moxifloxacin     Other reaction(s): itching Other reaction(s): itching Other reaction(s): itching Other reaction(s): itching  . Simvastatin     Muscle pain with large dose Other reaction(s): myalgia with higher doses  . Doxycycline Hyclate Diarrhea and Rash    Other reaction(s): rash, diarrhea  .  Penicillins Rash    Home Medications: Prior to Admission medications   Medication Sig Start Date End Date Taking? Authorizing Provider  amLODipine (NORVASC) 2.5 MG tablet Take 2.5 mg by mouth daily. 08/27/21   [provider]  aspirin EC 81 MG EC tablet Take 1 tablet (81 mg total) by mouth daily. Swallow whole. 10/27/20   Ghimire, Henreitta Leber, MD  Cholecalciferol (VITAMIN D PO) Take 1,000 Units by mouth daily.    [provider]  docusate sodium (COLACE) 100 MG capsule Take 1 capsule (100 mg total) by mouth daily as needed for mild constipation. 11/04/20   Love, Ivan Anchors, PA-C  furosemide (LASIX) 20 MG tablet 1 tablet 11/09/20   [provider]  Metoprolol Succinate  25 MG CS24 Take by mouth.    [provider]  rosuvastatin (CRESTOR) 20 MG tablet Take 1 tablet (20 mg total) by mouth daily. 03/08/22   Frann Rider, NP    Critical care time: ***   Rhae Lerner [facility] 03/19/22 8:16 PM  Please see [email or web address] for pager details.  From 7A-7P if no response, please call [phone] After hours, please call ELink [phone]

## 2022-03-19 NOTE — ED Notes (Signed)
Dykstra MD aware of pts arterial CO2 100.1

## 2022-03-20 ENCOUNTER — Inpatient Hospital Stay (HOSPITAL_COMMUNITY): Payer: Medicare Other

## 2022-03-20 DIAGNOSIS — J9601 Acute respiratory failure with hypoxia: Secondary | ICD-10-CM | POA: Diagnosis not present

## 2022-03-20 DIAGNOSIS — J9602 Acute respiratory failure with hypercapnia: Secondary | ICD-10-CM | POA: Diagnosis not present

## 2022-03-20 LAB — GLUCOSE, CAPILLARY
Glucose-Capillary: 118 mg/dL — ABNORMAL HIGH (ref 70–99)
Glucose-Capillary: 146 mg/dL — ABNORMAL HIGH (ref 70–99)
Glucose-Capillary: 155 mg/dL — ABNORMAL HIGH (ref 70–99)
Glucose-Capillary: 160 mg/dL — ABNORMAL HIGH (ref 70–99)
Glucose-Capillary: 167 mg/dL — ABNORMAL HIGH (ref 70–99)
Glucose-Capillary: 78 mg/dL (ref 70–99)
Glucose-Capillary: 96 mg/dL (ref 70–99)

## 2022-03-20 LAB — CBC
HCT: 45.8 % (ref 36.0–46.0)
Hemoglobin: 14 g/dL (ref 12.0–15.0)
MCH: 28.6 pg (ref 26.0–34.0)
MCHC: 30.6 g/dL (ref 30.0–36.0)
MCV: 93.5 fL (ref 80.0–100.0)
Platelets: 153 10*3/uL (ref 150–400)
RBC: 4.9 MIL/uL (ref 3.87–5.11)
RDW: 15 % (ref 11.5–15.5)
WBC: 9 10*3/uL (ref 4.0–10.5)
nRBC: 0 % (ref 0.0–0.2)

## 2022-03-20 LAB — BASIC METABOLIC PANEL
Anion gap: 8 (ref 5–15)
BUN: 24 mg/dL — ABNORMAL HIGH (ref 8–23)
CO2: 38 mmol/L — ABNORMAL HIGH (ref 22–32)
Calcium: 8.5 mg/dL — ABNORMAL LOW (ref 8.9–10.3)
Chloride: 92 mmol/L — ABNORMAL LOW (ref 98–111)
Creatinine, Ser: 0.96 mg/dL (ref 0.44–1.00)
GFR, Estimated: 57 mL/min — ABNORMAL LOW (ref >60)
Glucose, Bld: 74 mg/dL (ref 70–99)
Potassium: 4.6 mmol/L (ref 3.5–5.1)
Sodium: 138 mmol/L (ref 135–145)

## 2022-03-20 LAB — MAGNESIUM: Magnesium: 2 mg/dL (ref 1.7–2.4)

## 2022-03-20 LAB — HEMOGLOBIN A1C
Hgb A1c MFr Bld: 5.6 % (ref 4.8–5.6)
Mean Plasma Glucose: 114.02 mg/dL

## 2022-03-20 LAB — PHOSPHORUS: Phosphorus: 5.1 mg/dL — ABNORMAL HIGH (ref 2.5–4.6)

## 2022-03-20 MED ORDER — INSULIN ASPART 100 UNIT/ML IJ SOLN
0.0000 [IU] | Freq: Three times a day (TID) | INTRAMUSCULAR | Status: DC
  Administered 2022-03-21 – 2022-03-22 (×3): 3 [IU] via SUBCUTANEOUS

## 2022-03-20 MED ORDER — PREDNISONE 20 MG PO TABS
40.0000 mg | ORAL_TABLET | Freq: Every day | ORAL | Status: DC
Start: 2022-03-20 — End: 2022-03-23
  Administered 2022-03-20 – 2022-03-23 (×4): 40 mg via ORAL
  Filled 2022-03-20 (×4): qty 2

## 2022-03-20 MED ORDER — CHLORHEXIDINE GLUCONATE CLOTH 2 % EX PADS
6.0000 | MEDICATED_PAD | Freq: Every day | CUTANEOUS | Status: DC
  Administered 2022-03-20 – 2022-03-23 (×5): 6 via TOPICAL

## 2022-03-20 MED ORDER — HYDRALAZINE HCL 20 MG/ML IJ SOLN
10.0000 mg | Freq: Four times a day (QID) | INTRAMUSCULAR | Status: DC | PRN
Start: 2022-03-20 — End: 2022-03-23
  Administered 2022-03-20: 10 mg via INTRAVENOUS
  Filled 2022-03-20: qty 1

## 2022-03-20 MED ORDER — AMLODIPINE BESYLATE 5 MG PO TABS
2.5000 mg | ORAL_TABLET | Freq: Every day | ORAL | Status: DC
  Administered 2022-03-20 – 2022-03-23 (×4): 2.5 mg via ORAL
  Filled 2022-03-20 (×4): qty 1

## 2022-03-20 MED ORDER — AZITHROMYCIN 500 MG PO TABS
500.0000 mg | ORAL_TABLET | Freq: Every day | ORAL | Status: AC
  Administered 2022-03-20 – 2022-03-22 (×3): 500 mg via ORAL
  Filled 2022-03-20 (×3): qty 1

## 2022-03-20 MED ORDER — ALBUTEROL SULFATE (2.5 MG/3ML) 0.083% IN NEBU: 2.5000 mg | INHALATION_SOLUTION | RESPIRATORY_TRACT | Status: DC | PRN

## 2022-03-20 MED ORDER — IPRATROPIUM-ALBUTEROL 0.5-2.5 (3) MG/3ML IN SOLN
3.0000 mL | Freq: Two times a day (BID) | RESPIRATORY_TRACT | Status: DC
  Administered 2022-03-20 – 2022-03-22 (×6): 3 mL via RESPIRATORY_TRACT
  Filled 2022-03-20 (×6): qty 3

## 2022-03-20 MED ORDER — INSULIN ASPART 100 UNIT/ML IJ SOLN: 0.0000 [IU] | Freq: Every day | INTRAMUSCULAR | Status: DC

## 2022-03-20 NOTE — Progress Notes (Signed)
NAME:  [name], MRN:  [id number], DOB:  07-Apr-1933, LOS: 1 ADMISSION DATE:  03/19/2022 CONSULTATION DATE:  03/19/2022 REFERRING MD:  Roslynn Amble - EDP CHIEF COMPLAINT:  SOB, hypercarbia   History of Present Illness:  88 year old woman who presented to [facility] ED 8/7 for SOB, LE edema. PMHx significant for HTN, HLD, COPD, prior CVA without residual deficits (lacunar infarct 10/2020), thyroid cyst, OA.  Patient initially presented to [facility] ED from her PCP office via EMS with complaints of SOB, LE edema and AMS.  SpO2 noted to be 74% on RA, improved to 88% on 6LNC.  Nebulizer administered with improvement in O2 sat to 98%; unfortunately SpO2 remained labile and NRB was applied. Subsequently placed on BiPAP with improved O2 sats and decreased WOB. VBG obtained with pCO2 77.6. ABG subsequently obtained with pCO2 100.1. CXR with vascular congestion and ?interstitial edema.  Repeat VBG with pCO2 96.8 despite BiPAP and mild AMS/confusion. PCCM consulted for admission for persistent hypercarbia.  Pertinent Medical History:   Past Medical History:  Diagnosis Date   Colon polyp    COPD (chronic obstructive pulmonary disease) ([place])    MILD   Hyperlipidemia    Hypertension    OA (osteoarthritis)    OF THE KNEES   Osteopenia    Shingles    Stroke [facility])    Thyroid cyst    Significant Hospital Events: Including procedures, antibiotic start and stop dates in addition to other pertinent events   8/7 - Presented to [facility] ED for SOB, LE edema. Found to be mildly acidotic and hypercarbic. CXR with mild congestion, ?interstitial edema. Lasix '40mg'$  IV given, BiPAP initiated. PCCM consulted for admission in the setting of continuous BiPAP/persistent hypercarbia. Awake and interactive  Interim History / Subjective:  PCCM consulted for persistent hypercarbia No overnight events Tolerated BiPAP Admitted to some cough prior to coming in, was not having any fevers or chills, no secretions  Objective:  Blood pressure  (!) 120/49, pulse 76, temperature 98.3 F (36.8 C), temperature source Axillary, resp. rate (!) 22, height '4\' 11"'$  (1.499 m), weight 91.7 kg, SpO2 98 %.    FiO2 (%):  [50 %] 50 %   Intake/Output Summary (Last 24 hours) at 03/20/2022 0858 Last data filed at 03/20/2022 7902 Gross per 24 hour  Intake --  Output 450 ml  Net -450 ml   Filed Weights   03/20/22 0007 03/20/22 0500  Weight: 91.7 kg 91.7 kg    Physical Examination: General: Chronically ill-appearing, does not appear to be in distress HEENT: BiPAP in place Neuro: Awake and interactive, moving all extremities CV: S1-S2 appreciated PULM: Clear breath sounds, decreased at the bases GI: Soft, nontender. Extremities: Bilateral 2+ pitting LE edema noted. Skin: Cool/dry, no rashes.  Resolved Hospital Problem List:    Assessment & Plan:   Acute on chronic hypoxic and hypercapnic respiratory failure History of COPD -Was not on any inhalers at home -May be off BiPAP, -Bronchodilators as needed -Steroids -Antibiotics-add azithromycin for exacerbation of obstructive lung disease  Recent COVID infection  Concern for congestive heart failure -Previous hospitalization for heart failure -Echocardiogram pending -Cautious diuresis as tolerated  Hypertension -Continue home antihypertensives  Hyperlipidemia -Continue aspirin -Continue statins  Repeat venous blood gas to assess hypercarbia  Best Practice: (right click and "Reselect all SmartList Selections" daily)   Diet/type: Regular consistency (see orders) DVT prophylaxis: prophylactic heparin , SCDs GI prophylaxis: N/A Lines: N/A Foley:  N/A Code Status:  full code - previously partial code with DNI  last admission, repeat discussion with patient/family 8/8AM Last date of multidisciplinary goals of care discussion [Pending]  Labs:  CBC: Recent Labs  Lab 03/19/22 1656 03/19/22 1707 03/19/22 1918 03/19/22 2217  WBC 10.8*  --   --   --   NEUTROABS 8.5*  --   --    --   HGB 15.9* 18.0* 16.7* 16.0*  HCT 51.2* 53.0* 49.0* 47.0*  MCV 93.9  --   --   --   PLT 199  --   --   --    Basic Metabolic Panel: Recent Labs  Lab 03/19/22 1656 03/19/22 1707 03/19/22 1918 03/19/22 2217  NA 133* 132* 133* 133*  K 4.9 4.6 4.6 4.7  CL 93*  --   --   --   CO2 33*  --   --   --   GLUCOSE 129*  --   --   --   BUN 23  --   --   --   CREATININE 0.95  --   --   --   CALCIUM 8.8*  --   --   --    GFR: Estimated Creatinine Clearance: 41.2 mL/min (by C-G formula based on SCr of 0.95 mg/dL). Recent Labs  Lab 03/19/22 1656  WBC 10.8*   Liver Function Tests: Recent Labs  Lab 03/19/22 1656  AST 28  ALT 19  ALKPHOS 58  BILITOT 0.5  PROT 7.6  ALBUMIN 4.0   No results for input(s): "LIPASE", "AMYLASE" in the last 168 hours. No results for input(s): "AMMONIA" in the last 168 hours.  ABG:    Component Value Date/Time   PHART 7.181 (LL) 03/19/2022 1918   PCO2ART 100.1 (HH) 03/19/2022 1918   PO2ART 89 03/19/2022 1918   HCO3 40.0 (H) 03/19/2022 2217   TCO2 43 (H) 03/19/2022 2217   O2SAT 61 03/19/2022 2217   Coagulation Profile: No results for input(s): "INR", "PROTIME" in the last 168 hours.  Cardiac Enzymes: No results for input(s): "CKTOTAL", "CKMB", "CKMBINDEX", "TROPONINI" in the last 168 hours.  HbA1C: Hgb A1c MFr Bld  Date/Time Value Ref Range Status  10/24/2020 01:35 AM 5.9 (H) 4.8 - 5.6 % Final    Comment:    (NOTE) Pre diabetes:          5.7%-6.4%  Diabetes:              >6.4%  Glycemic control for   <7.0% adults with diabetes    CBG: Recent Labs  Lab 03/20/22 0009 03/20/22 0346 03/20/22 0747  GLUCAP 118* 96 78    Review of Systems:   Review of systems completed with pertinent positives/negatives outlined in above HPI.  Past Medical History:  She,  has a past medical history of Colon polyp, COPD (chronic obstructive pulmonary disease) ([place]), Hyperlipidemia, Hypertension, OA (osteoarthritis), Osteopenia, Shingles, Stroke  ([place]), and Thyroid cyst.   Surgical History:   Past Surgical History:  Procedure Laterality Date   BREAST EXCISIONAL BIOPSY Right    benign    Social History:   reports that she has quit smoking. She has never used smokeless tobacco. She reports that she does not currently use alcohol. She reports that she does not use drugs.   Family History:  Her family history includes CAD in her father. There is no history of Breast cancer.   Allergies: Allergies  Allergen Reactions   Atorvastatin     Other reaction(s): soreness   Ceftin [Cefuroxime] Diarrhea   Cefuroxime Axetil  Other reaction(s): severe diarrhea   Clindamycin Hcl     Other reaction(s): itching   Clindamycin/Lincomycin Itching   Moxifloxacin     Other reaction(s): itching Other reaction(s): itching Other reaction(s): itching Other reaction(s): itching   Simvastatin     Muscle pain with large dose Other reaction(s): myalgia with higher doses   Doxycycline Hyclate Diarrhea and Rash    Other reaction(s): rash, diarrhea   Penicillins Rash    Home Medications: Prior to Admission medications   Medication Sig Start Date End Date Taking? Authorizing Provider  amLODipine (NORVASC) 2.5 MG tablet Take 2.5 mg by mouth daily. 08/27/21   [provider]  aspirin EC 81 MG EC tablet Take 1 tablet (81 mg total) by mouth daily. Swallow whole. 10/27/20   Ghimire, Henreitta Leber, MD  Cholecalciferol (VITAMIN D PO) Take 1,000 Units by mouth daily.    [provider]  docusate sodium (COLACE) 100 MG capsule Take 1 capsule (100 mg total) by mouth daily as needed for mild constipation. 11/04/20   Love, Ivan Anchors, PA-C  furosemide (LASIX) 20 MG tablet 1 tablet 11/09/20   [provider]  Metoprolol Succinate 25 MG CS24 Take by mouth.    [provider]  rosuvastatin (CRESTOR) 20 MG tablet Take 1 tablet (20 mg total) by mouth daily. 03/08/22   Frann Rider, NP    The patient is critically ill with multiple  organ systems failure and requires high complexity decision making for assessment and support, frequent evaluation and titration of therapies, application of advanced monitoring technologies and extensive interpretation of multiple databases. Critical Care Time devoted to patient care services described in this note independent of APP/resident time (if applicable)  is 32 minutes.   Sherrilyn Rist MD [place] [facility] Personal pager: See Amion If unanswered, please page CCM On-call: [phone]

## 2022-03-20 NOTE — Plan of Care (Signed)

## 2022-03-20 NOTE — Progress Notes (Signed)
[place] Progress Note Patient Name: DELANDA BULLUCK DOB: January 16, 1933 MRN: [id number]   Date of Service  03/20/2022  HPI/Events of Note  88 year old woman with acute resp failure needing BIPAP. Most recent ABG has improved. PCCM admitting. She is awake and alert with stable vitals on 20/6/60% bipap with o2 sat 95.   eICU Interventions  D/w RN Plan per CCM team, their note is pending at this time Continue bipap, serial ABG      Intervention Category Major Interventions: Respiratory failure - evaluation and management Evaluation Type: New Patient Evaluation  Margaretmary Lombard 03/20/2022, 12:09 AM

## 2022-03-20 NOTE — Progress Notes (Signed)
Pt refused to wear BiPAP tonight. States she is more comfortable with the oxygen and will call if she does need it later.

## 2022-03-20 NOTE — Progress Notes (Signed)
Venous blood gas reviewed showing improvement in PCO2 to 81  Currently off BIPAP  BIPAP  as needed during the day and at night

## 2022-03-21 ENCOUNTER — Inpatient Hospital Stay (HOSPITAL_COMMUNITY): Payer: Medicare Other

## 2022-03-21 DIAGNOSIS — J9622 Acute and chronic respiratory failure with hypercapnia: Secondary | ICD-10-CM

## 2022-03-21 DIAGNOSIS — I509 Heart failure, unspecified: Secondary | ICD-10-CM

## 2022-03-21 DIAGNOSIS — J9602 Acute respiratory failure with hypercapnia: Secondary | ICD-10-CM | POA: Diagnosis not present

## 2022-03-21 DIAGNOSIS — J9601 Acute respiratory failure with hypoxia: Secondary | ICD-10-CM | POA: Diagnosis not present

## 2022-03-21 DIAGNOSIS — J9621 Acute and chronic respiratory failure with hypoxia: Secondary | ICD-10-CM | POA: Diagnosis not present

## 2022-03-21 LAB — CBC WITH DIFFERENTIAL/PLATELET
Abs Immature Granulocytes: 0.04 10*3/uL (ref 0.00–0.07)
Basophils Absolute: 0 10*3/uL (ref 0.0–0.1)
Basophils Relative: 0 %
Eosinophils Absolute: 0 10*3/uL (ref 0.0–0.5)
Eosinophils Relative: 0 %
HCT: 49 % — ABNORMAL HIGH (ref 36.0–46.0)
Hemoglobin: 15.1 g/dL — ABNORMAL HIGH (ref 12.0–15.0)
Immature Granulocytes: 1 %
Lymphocytes Relative: 7 %
Lymphs Abs: 0.5 10*3/uL — ABNORMAL LOW (ref 0.7–4.0)
MCH: 29.2 pg (ref 26.0–34.0)
MCHC: 30.8 g/dL (ref 30.0–36.0)
MCV: 94.6 fL (ref 80.0–100.0)
Monocytes Absolute: 0.3 10*3/uL (ref 0.1–1.0)
Monocytes Relative: 4 %
Neutro Abs: 6.1 10*3/uL (ref 1.7–7.7)
Neutrophils Relative %: 88 %
Platelets: 170 10*3/uL (ref 150–400)
RBC: 5.18 MIL/uL — ABNORMAL HIGH (ref 3.87–5.11)
RDW: 15 % (ref 11.5–15.5)
WBC: 7 10*3/uL (ref 4.0–10.5)
nRBC: 0 % (ref 0.0–0.2)

## 2022-03-21 LAB — ECHOCARDIOGRAM COMPLETE
AR max vel: 1.55 cm2
AV Area VTI: 1.67 cm2
AV Area mean vel: 1.47 cm2
AV Mean grad: 2 mmHg
AV Peak grad: 3.3 mmHg
Ao pk vel: 0.91 m/s
Area-P 1/2: 3.48 cm2
Height: 59 in
S' Lateral: 3.6 cm
Single Plane A4C EF: 65.3 %
Weight: 3358.05 oz

## 2022-03-21 LAB — COMPREHENSIVE METABOLIC PANEL
ALT: 16 U/L (ref 0–44)
AST: 23 U/L (ref 15–41)
Albumin: 3.2 g/dL — ABNORMAL LOW (ref 3.5–5.0)
Alkaline Phosphatase: 47 U/L (ref 38–126)
Anion gap: 12 (ref 5–15)
BUN: 36 mg/dL — ABNORMAL HIGH (ref 8–23)
CO2: 30 mmol/L (ref 22–32)
Calcium: 8.3 mg/dL — ABNORMAL LOW (ref 8.9–10.3)
Chloride: 94 mmol/L — ABNORMAL LOW (ref 98–111)
Creatinine, Ser: 1.15 mg/dL — ABNORMAL HIGH (ref 0.44–1.00)
GFR, Estimated: 46 mL/min — ABNORMAL LOW (ref >60)
Glucose, Bld: 134 mg/dL — ABNORMAL HIGH (ref 70–99)
Potassium: 5.5 mmol/L — ABNORMAL HIGH (ref 3.5–5.1)
Sodium: 136 mmol/L (ref 135–145)
Total Bilirubin: 0.4 mg/dL (ref 0.3–1.2)
Total Protein: 6.3 g/dL — ABNORMAL LOW (ref 6.5–8.1)

## 2022-03-21 LAB — GLUCOSE, CAPILLARY
Glucose-Capillary: 112 mg/dL — ABNORMAL HIGH (ref 70–99)
Glucose-Capillary: 114 mg/dL — ABNORMAL HIGH (ref 70–99)
Glucose-Capillary: 118 mg/dL — ABNORMAL HIGH (ref 70–99)
Glucose-Capillary: 152 mg/dL — ABNORMAL HIGH (ref 70–99)
Glucose-Capillary: 169 mg/dL — ABNORMAL HIGH (ref 70–99)

## 2022-03-21 LAB — BLOOD GAS, VENOUS
Acid-Base Excess: 14 mmol/L — ABNORMAL HIGH (ref 0.0–2.0)
Bicarbonate: 43.7 mmol/L — ABNORMAL HIGH (ref 20.0–28.0)
Drawn by: 643661
O2 Saturation: 56 %
Patient temperature: 36.9
pCO2, Ven: 81 mmHg (ref 44–60)
pH, Ven: 7.34 (ref 7.25–7.43)
pO2, Ven: <31 mmHg — CL (ref 32–45)

## 2022-03-21 MED ORDER — PERFLUTREN LIPID MICROSPHERE
1.0000 mL | INTRAVENOUS | Status: AC | PRN
  Administered 2022-03-21: 2 mL via INTRAVENOUS

## 2022-03-21 MED ORDER — SODIUM ZIRCONIUM CYCLOSILICATE 5 G PO PACK
5.0000 g | PACK | Freq: Once | ORAL | Status: AC
Start: 2022-03-21 — End: 2022-03-21
  Administered 2022-03-21: 5 g via ORAL
  Filled 2022-03-21: qty 1

## 2022-03-21 NOTE — Progress Notes (Signed)
eLink Physician-Brief Progress Note Patient Name: RIATA IKEDA DOB: [birth year] MRN: [id number]   Date of Service  03/21/2022  HPI/Events of Note  K 5.5, Cr 1.15  eICU Interventions  Lokelma 5 gm enterally x 1        Wendel Homeyer U Jenelle Drennon 03/21/2022, 4:01 AM

## 2022-03-21 NOTE — Progress Notes (Addendum)
PROGRESS NOTE    [name]  YTK:[id number] DOB: 07/29/1933 DOA: 03/19/2022 PCP: Shirline Frees, MD    Brief Narrative:  [name] is a 88 y.o. female with hx of HTN, HLD, COPD, CVA hx, OA  SOB in ED, initially hypoxemic 74%, improved with O2.  Started on bipap for labile sat O2, hypercarbia.  Acute on chronic hypercarbia (CO2 on chem panel elevated to 40s).   Assessment and Plan:  Acute-on-chronic hypoxic and hypercarbic respiratory failure History of mild COPD - Continue supplemental O2 support - Wean O2 for sat > 90% - Bronchodilators as needed - Pulmonary hygiene - May benefit from repeat PFTs once recovered   Concern for CHF Mildly elevated BNP to 376. Previous Echo 10/2020 with EF 65-70%, mild concentric LVH, indeterminate diastolic parameters. Lasix '20mg'$  daily at home, previously on HCTZ (did well, she's unsure why this was discontinued). Previously seen by Dr. Tamala Julian [facility]). - Diuresis as tolerated- Lasix '40mg'$  IV - Monitor strict I&Os - Trend BNP - Repeat Echo -has appt with Dr. Tamala Julian 8/28   HTN Home regimen includes Norvasc 2.'5mg'$  daily,Toprol XL '25mg'$  daily.  - Continue home antihypertensives, consider metoprolol tartrate while critically ill - Hydralazine IV PRN for SBP > 150 - Cardiac monitoring   HLD Prior lacunar CVA - Continue ASA, statin  Obesity Estimated body mass index is 42.39 kg/m as calculated from the following:   Height as of this encounter: '4\' 11"'$  (1.499 m).   Weight as of this encounter: 95.2 kg.   Hyperkalemia -given lokelma BMP in AM  DVT prophylaxis: heparin injection 5,000 Units Start: 03/20/22 0600 SCDs Start: 03/19/22 2312    Code Status: Full Code Family Communication: sister on phone  Disposition Plan:  Level of care: Med-Surg Status is: Inpatient Remains inpatient appropriate because: needs to be weaned off O2    Consultants:  Tx from PCCM   Subjective: Multiple complaints, does think breathing is  better  Objective: Vitals:   03/21/22 0600 03/21/22 0630 03/21/22 0737 03/21/22 0806  BP:      Pulse: 92 95    Resp: (!) 25 (!) 26    Temp:   98.3 F (36.8 C)   TempSrc:   Oral   SpO2: 97% 92%  96%  Weight:      Height:        Intake/Output Summary (Last 24 hours) at 03/21/2022 1053 Last data filed at 03/21/2022 0810 Gross per 24 hour  Intake 440 ml  Output 825 ml  Net -385 ml   Filed Weights   03/20/22 0007 03/20/22 0500 03/21/22 0500  Weight: 91.7 kg 91.7 kg 95.2 kg    Examination:   General: Appearance:    Severely obese female in no acute distress     Lungs:     Diminished, able to speak in full sentences, respirations unlabored  Heart:    Normal heart rate. Normal rhythm. No murmurs, rubs, or gallops.    MS:   All extremities are intact.    Neurologic:   Awake, alert       Data Reviewed: I have personally reviewed following labs and imaging studies  CBC: Recent Labs  Lab 03/19/22 1656 03/19/22 1707 03/19/22 1918 03/19/22 2217 03/20/22 0844 03/21/22 0117  WBC 10.8*  --   --   --  9.0 7.0  NEUTROABS 8.5*  --   --   --   --  6.1  HGB 15.9* 18.0* 16.7* 16.0* 14.0 15.1*  HCT 51.2* 53.0* 49.0*  47.0* 45.8 49.0*  MCV 93.9  --   --   --  93.5 94.6  PLT 199  --   --   --  153 812   Basic Metabolic Panel: Recent Labs  Lab 03/19/22 1656 03/19/22 1707 03/19/22 1918 03/19/22 2217 03/20/22 0844 03/21/22 0117  NA 133* 132* 133* 133* 138 136  K 4.9 4.6 4.6 4.7 4.6 5.5*  CL 93*  --   --   --  92* 94*  CO2 33*  --   --   --  38* 30  GLUCOSE 129*  --   --   --  74 134*  BUN 23  --   --   --  24* 36*  CREATININE 0.95  --   --   --  0.96 1.15*  CALCIUM 8.8*  --   --   --  8.5* 8.3*  MG  --   --   --   --  2.0  --   PHOS  --   --   --   --  5.1*  --    GFR: Estimated Creatinine Clearance: 34.8 mL/min (A) (by C-G formula based on SCr of 1.15 mg/dL (H)). Liver Function Tests: Recent Labs  Lab 03/19/22 1656 03/21/22 0117  AST 28 23  ALT 19 16  ALKPHOS  58 47  BILITOT 0.5 0.4  PROT 7.6 6.3*  ALBUMIN 4.0 3.2*   No results for input(s): "LIPASE", "AMYLASE" in the last 168 hours. No results for input(s): "AMMONIA" in the last 168 hours. Coagulation Profile: No results for input(s): "INR", "PROTIME" in the last 168 hours. Cardiac Enzymes: No results for input(s): "CKTOTAL", "CKMB", "CKMBINDEX", "TROPONINI" in the last 168 hours. BNP (last 3 results) No results for input(s): "PROBNP" in the last 8760 hours. HbA1C: Recent Labs    03/20/22 0844  HGBA1C 5.6   CBG: Recent Labs  Lab 03/20/22 1930 03/20/22 2155 03/20/22 2356 03/21/22 0316 03/21/22 0736  GLUCAP 155* 167* 152* 118* 114*   Lipid Profile: No results for input(s): "CHOL", "HDL", "LDLCALC", "TRIG", "CHOLHDL", "LDLDIRECT" in the last 72 hours. Thyroid Function Tests: No results for input(s): "TSH", "T4TOTAL", "FREET4", "T3FREE", "THYROIDAB" in the last 72 hours. Anemia Panel: No results for input(s): "VITAMINB12", "FOLATE", "FERRITIN", "TIBC", "IRON", "RETICCTPCT" in the last 72 hours. Sepsis Labs: No results for input(s): "PROCALCITON", "LATICACIDVEN" in the last 168 hours.  No results found for this or any previous visit (from the past 240 hour(s)).       Radiology Studies: DG Chest Port 1 View  Result Date: 03/20/2022 CLINICAL DATA:  88 year old female with shortness of breath. EXAM: PORTABLE CHEST 1 VIEW COMPARISON:  Portable chest 03/19/2022 and earlier. FINDINGS: Portable AP semi upright view at 0531 hours. Stable lung volumes and mediastinal contours. Mildly to moderately improved ventilation at the lung bases, especially the left where there is regressed patchy opacity. No pneumothorax or pulmonary edema. Small pleural effusions are possible. Calcified aortic atherosclerosis. Visualized tracheal air column is within normal limits. No acute osseous abnormality identified. Paucity of bowel gas in the upper abdomen. IMPRESSION: 1. Improved lung base ventilation  since yesterday. Residual patchy opacity and possible small pleural effusions. 2. No new cardiopulmonary abnormality. Electronically Signed   By: Genevie Ann M.D.   On: 03/20/2022 05:55   DG Chest Portable 1 View  Result Date: 03/19/2022 CLINICAL DATA:  Shortness of breath EXAM: PORTABLE CHEST 1 VIEW COMPARISON:  10/23/2020 FINDINGS: Heart is normal size. Mild vascular congestion. Bilateral lower  lobe airspace opacities. Diffuse interstitial prominence throughout the lungs. No acute bony abnormality. IMPRESSION: Mild vascular congestion and interstitial prominence which may reflect interstitial edema. Bibasilar atelectasis or infiltrates. Electronically Signed   By: Rolm Baptise M.D.   On: 03/19/2022 17:33        Scheduled Meds:  amLODipine  2.5 mg Oral Daily   azithromycin  500 mg Oral Daily   Chlorhexidine Gluconate Cloth  6 each Topical Q0600   heparin  5,000 Units Subcutaneous Q8H   insulin aspart  0-15 Units Subcutaneous TID WC   insulin aspart  0-5 Units Subcutaneous QHS   ipratropium-albuterol  3 mL Nebulization BID   predniSONE  40 mg Oral Q breakfast   Continuous Infusions:   LOS: 2 days    Time spent: 45 minutes spent on chart review, discussion with nursing staff, consultants, updating family and interview/physical exam; more than 50% of that time was spent in counseling and/or coordination of care.    Geradine Girt, DO [facility] Available via Epic secure chat 7am-7pm After these hours, please refer to coverage provider listed on [email or web address] 03/21/2022, 10:53 AM

## 2022-03-21 NOTE — TOC Initial Note (Signed)
Transition of Care [facility]) - Initial/Assessment Note    Patient Details  Name: [name] MRN: [id number] Date of Birth: July 16, 1933  Transition of Care [facility]) CM/SW Contact:    Tom-Johnson, Renea Ee, RN Phone Number: 03/21/2022, 5:01 PM  Clinical Narrative:                  CM spoke with patient at bedside about needs for post hospital transition. Admitted for Acute respiratory failure with Hypoxia and Hypercarbia. Currently on 4L O2, does not use home O2.  From home alone. Does not have children, has several nieces and nephew that are supportive. Her niece, [name] is her contact. She is listed as a friend. Patient has a walker, walk-in tub, grab bars at home. Independent with care and drive self prior to admission.  PCP is Linda Frees, MD and [facility] [address]. Family to transport at discharge.  No TOC needs or recommendations noted at this time. CM will continue to follow with needs as patient progresses with care.    Barriers to Discharge: Continued Medical Work up   Patient Goals and CMS Choice Patient states their goals for this hospitalization and ongoing recovery are:: To return home CMS Medicare.gov Compare Post Acute Care list provided to:: Patient    Expected Discharge Plan and Services     Discharge Planning Services: CM Consult                                          Prior Living Arrangements/Services   Lives with:: Self Patient language and need for interpreter reviewed:: Yes Do you feel safe going back to the place where you live?: Yes      Need for Family Participation in Patient Care: Yes (Comment) Care giver support system in place?: Yes (comment) Current home services: DME [place] Rile, walk-in tub, grab bars.) Criminal Activity/Legal Involvement Pertinent to Current Situation/Hospitalization: No - Comment as needed  Activities of Daily Living      Permission Sought/Granted Permission sought to share information with :  Case Manager, Family Supports Permission granted to share information with : Yes, Verbal Permission Granted              Emotional Assessment Appearance:: Appears stated age Attitude/Demeanor/Rapport: Engaged, Gracious Affect (typically observed): Accepting, Apprehensive, Calm, Hopeful, Pleasant Orientation: : Oriented to Self, Oriented to Place, Oriented to Situation Alcohol / Substance Use: Not Applicable Psych Involvement: No (comment)  Admission diagnosis:  Acute respiratory failure with hypoxia and hypercarbia (HCC) [J96.01, J96.02] Acute respiratory failure with hypoxia and hypercapnia (HCC) [J96.01, J96.02] Hypervolemia, unspecified hypervolemia type [E87.70] Patient Active Problem List   Diagnosis Date Noted   Acute respiratory failure with hypoxia and hypercarbia (HCC) 03/19/2022   Disorder of bone 10/16/2021   Allergic rhinitis 07/05/2021   Body mass index (BMI) 37.0-37.9, adult 07/05/2021   Cardiac arrhythmia 07/05/2021   Chronic diastolic heart failure ([place]) 07/05/2021   Fecal smearing 07/05/2021   Incontinence of urine 07/05/2021   Lesion of ulnar nerve 07/05/2021   Osteoarthritis of knee 07/05/2021   Overactive bladder 07/05/2021   Personal history of colonic polyps 07/05/2021   Pure hypercholesterolemia 07/05/2021   Sciatica 07/05/2021   Sleep disturbance    Benign essential HTN    Prediabetes    Hyperglycemia    Hypercapnia    Stroke (cerebrum) (HCC) 10/26/2020   Encephalopathy    Hyponatremia  Supplemental oxygen dependent    Chronic low back pain    Volume overload 10/24/2020   Ischemic stroke of frontal lobe ([place]) 10/23/2020   Tremors of nervous system 10/23/2020   Acute respiratory failure with hypoxia ([place]) 10/21/2020   Hypertensive urgency 10/21/2020   Pain in joint of right hip 02/18/2020   Spondylolisthesis of lumbar region 05/12/2018   Degeneration of lumbar intervertebral disc 05/12/2018   Essential hypertension 08/25/2014   Dyspnea  08/25/2014   Morbid obesity ([place]) 08/25/2014   COPD (chronic obstructive pulmonary disease) ([place]) 08/25/2014   PCP:  Linda Frees, MD [id number] [place] [place]Phone: [phone]: [phone]    Social Determinants of Health (SDOH) Interventions    Readmission Risk Interventions     No data to display

## 2022-03-21 NOTE — Progress Notes (Signed)
15:30- Patient arrived onto unit into room 2W29. A&O x4- confused at times but easily re-oriented. VSS. On 3L [place], tolerating well. Updated on plan of care. All questions/concerns addressed.

## 2022-03-21 NOTE — Progress Notes (Signed)
Pt refused bipap for the evening. RT will cont to monitor as needed.

## 2022-03-21 NOTE — Progress Notes (Signed)
Nutrition Brief Note  Per discussion with RN in ICU rounds, patient wants to be on a low fat low sodium diet. She is currently on a regular diet with excellent PO intake. RD to change diet to heart healthy per patient request.  Lucas Mallow RD, LDN, CNSC Please refer to Amion for contact information.

## 2022-03-22 DIAGNOSIS — J9602 Acute respiratory failure with hypercapnia: Secondary | ICD-10-CM | POA: Diagnosis not present

## 2022-03-22 DIAGNOSIS — J9601 Acute respiratory failure with hypoxia: Secondary | ICD-10-CM | POA: Diagnosis not present

## 2022-03-22 LAB — CBC
HCT: 47.6 % — ABNORMAL HIGH (ref 36.0–46.0)
Hemoglobin: 14.3 g/dL (ref 12.0–15.0)
MCH: 28.7 pg (ref 26.0–34.0)
MCHC: 30 g/dL (ref 30.0–36.0)
MCV: 95.6 fL (ref 80.0–100.0)
Platelets: 178 10*3/uL (ref 150–400)
RBC: 4.98 MIL/uL (ref 3.87–5.11)
RDW: 15.1 % (ref 11.5–15.5)
WBC: 11.4 10*3/uL — ABNORMAL HIGH (ref 4.0–10.5)
nRBC: 0 % (ref 0.0–0.2)

## 2022-03-22 LAB — GLUCOSE, CAPILLARY
Glucose-Capillary: 136 mg/dL — ABNORMAL HIGH (ref 70–99)
Glucose-Capillary: 153 mg/dL — ABNORMAL HIGH (ref 70–99)
Glucose-Capillary: 94 mg/dL (ref 70–99)
Glucose-Capillary: 181 mg/dL — ABNORMAL HIGH (ref 70–99)
Glucose-Capillary: 187 mg/dL — ABNORMAL HIGH (ref 70–99)

## 2022-03-22 LAB — BASIC METABOLIC PANEL
Anion gap: 6 (ref 5–15)
BUN: 25 mg/dL — ABNORMAL HIGH (ref 8–23)
CO2: 41 mmol/L — ABNORMAL HIGH (ref 22–32)
Calcium: 9.1 mg/dL (ref 8.9–10.3)
Chloride: 92 mmol/L — ABNORMAL LOW (ref 98–111)
Creatinine, Ser: 0.93 mg/dL (ref 0.44–1.00)
GFR, Estimated: 59 mL/min — ABNORMAL LOW (ref >60)
Glucose, Bld: 95 mg/dL (ref 70–99)
Potassium: 5.1 mmol/L (ref 3.5–5.1)
Sodium: 139 mmol/L (ref 135–145)

## 2022-03-22 MED ORDER — IPRATROPIUM-ALBUTEROL 0.5-2.5 (3) MG/3ML IN SOLN
3.0000 mL | Freq: Three times a day (TID) | RESPIRATORY_TRACT | Status: DC
  Administered 2022-03-22 (×2): 3 mL via RESPIRATORY_TRACT
  Filled 2022-03-22 (×3): qty 3

## 2022-03-22 MED ORDER — FUROSEMIDE 20 MG PO TABS
20.0000 mg | ORAL_TABLET | Freq: Every day | ORAL | Status: DC
  Administered 2022-03-22 – 2022-03-23 (×2): 20 mg via ORAL
  Filled 2022-03-22 (×2): qty 1

## 2022-03-22 NOTE — TOC Progression Note (Signed)
Transition of Care [facility]) - Progression Note    Patient Details  Name: [name] MRN: [id number] Date of Birth: 01/29/1933  Transition of Care [facility]) CM/SW [place], RN Phone Number: 03/22/2022, 11:42 AM  Clinical Narrative:    CM met with the patient at the bedside to discuss transitions of care needs.  The patient has pending evaluation for oxygen desaturation by PT.  The patient states that she does not require oxygen at home at this time and does not have home health services.  The patient is able to drive at this time and has available family/friends for assistance.  The patient has DME at home including RW, 3:1, cane, electric scooter at home.  PCP - Dr. Shirline Frees / Dr. Dema Severin.  CM will continue to follow the patient for discharge needs for home - pending PT evaluation / Oxygen desaturation screen pending.   Expected Discharge Plan: Home/Self Care Barriers to Discharge: Continued Medical Work up  Expected Discharge Plan and Services Expected Discharge Plan: Home/Self Care   Discharge Planning Services: CM Consult Post Acute Care Choice:  (Patient with pending evaluation for Oxygen needs for home.) Living arrangements for the past 2 months: Single Family Home                                       Social Determinants of Health (SDOH) Interventions    Readmission Risk Interventions     No data to display

## 2022-03-22 NOTE — Progress Notes (Signed)
SATURATION QUALIFICATIONS: (This note is used to comply with regulatory documentation for home oxygen)  Patient Saturations on Room Air at Rest = 90%  Patient Saturations on Room Air while Ambulating = 83%  Patient Saturations on 2 Liters of oxygen while Ambulating = 91%  Please briefly explain why patient needs home oxygen: to maintain SPO2 >88% during mobility  Saagar Tortorella S, PT DPT Acute Rehabilitation Services Pager [phone]  Office [phone]

## 2022-03-22 NOTE — Evaluation (Signed)
Physical Therapy Evaluation Patient Details Name: [name] MRN: [id number] DOB: [birth year] Today's Date: 03/22/2022  History of Present Illness  88 yo female presents to [facility] on 8/7 with shob, edema, AMS. workup for acute on chronic respiratory failure, with mild COPD, concern for CHF. PMHx significant for HTN, HLD, COPD, prior CVA without residual deficits (lacunar infarct 10/2020), thyroid cyst, OA  Clinical Impression   Pt presents with dyspnea on exertion with accompanying O2 desaturation, impaired activity tolerance, and decreased knowledge/understanding of proper breathing technique. Pt to benefit from acute PT to address deficits. Pt ambulated hallway distance with use of RW and supervision level of assist and occasional cuing for form, pt's main issue during mobility was SpO2 drop to 83% on RA requiring 2LO2 to maintain SPO2 >88% during mobility (see O2 qualifying note previously). PT to progress mobility as tolerated, and will continue to follow acutely.         Recommendations for follow up therapy are one component of a multi-disciplinary discharge planning process, led by the attending physician.  Recommendations may be updated based on patient status, additional functional criteria and insurance authorization.  Follow Up Recommendations No PT follow up      Assistance Recommended at Discharge PRN  Patient can return home with the following       Equipment Recommendations None recommended by PT  Recommendations for Other Services       Functional Status Assessment Patient has had a recent decline in their functional status and demonstrates the ability to make significant improvements in function in a reasonable and predictable amount of time.     Precautions / Restrictions Precautions Precautions: Fall Restrictions Weight Bearing Restrictions: No      Mobility  Bed Mobility Overal bed mobility: Modified Independent             General bed mobility  comments: up in chair    Transfers Overall transfer level: Modified independent Equipment used: Rolling walker (2 wheels)               General transfer comment: increased time and use of RW    Ambulation/Gait Ambulation/Gait assistance: Supervision Gait Distance (Feet): 75 Feet Assistive device: Rolling walker (2 wheels) Gait Pattern/deviations: Step-through pattern, Decreased stride length, Trunk flexed Gait velocity: decr     General Gait Details: increased time, cues for upright posture and placement in RW. standing, elbow-propped rest break x1 with O2 desat to 83% on RA, requiring 2LO2  Stairs            Wheelchair Mobility    Modified Rankin (Stroke Patients Only)       Balance Overall balance assessment: Needs assistance Sitting-balance support: No upper extremity supported, Feet supported Sitting balance-Leahy Scale: Normal     Standing balance support: During functional activity, Bilateral upper extremity supported Standing balance-Leahy Scale: Fair                               Pertinent Vitals/Pain Pain Assessment Pain Assessment: No/denies pain    Home Living Family/patient expects to be discharged to:: Private residence Living Arrangements: Alone Available Help at Discharge: Family Type of Home: House Home Access: Stairs to enter   Technical brewer of Steps: 1   Home Layout: One level Home Equipment: Conservation officer, nature (2 wheels);Shower seat - built in;Grab bars - tub/shower      Prior Function Prior Level of Function : Independent/Modified Independent  Mobility Comments: pt reports using RW for gait PTA ADLs Comments: pt reports indep     Hand Dominance   Dominant Hand: Right    Extremity/Trunk Assessment   Upper Extremity Assessment Upper Extremity Assessment: Defer to OT evaluation    Lower Extremity Assessment Lower Extremity Assessment: Generalized weakness    Cervical / Trunk  Assessment Cervical / Trunk Assessment: Normal  Communication   Communication: No difficulties  Cognition Arousal/Alertness: Awake/alert Behavior During Therapy: WFL for tasks assessed/performed Overall Cognitive Status: Within Functional Limits for tasks assessed                                          General Comments General comments (skin integrity, edema, etc.): required 2LO2 during mobility to maintain SPO2 >88%    Exercises     Assessment/Plan    PT Assessment Patient needs continued PT services  PT Problem List Decreased mobility;Decreased strength;Decreased activity tolerance;Cardiopulmonary status limiting activity       PT Treatment Interventions DME instruction;Therapeutic activities;Gait training;Therapeutic exercise;Patient/family education;Functional mobility training;Stair training;Balance training    PT Goals (Current goals can be found in the Care Plan section)  Acute Rehab PT Goals Patient Stated Goal: home PT Goal Formulation: With patient Time For Goal Achievement: 04/05/22 Potential to Achieve Goals: Good    Frequency Min 3X/week     Co-evaluation               AM-PAC PT "6 Clicks" Mobility  Outcome Measure Help needed turning from your back to your side while in a flat bed without using bedrails?: None Help needed moving from lying on your back to sitting on the side of a flat bed without using bedrails?: None Help needed moving to and from a bed to a chair (including a wheelchair)?: None Help needed standing up from a chair using your arms (e.g., wheelchair or bedside chair)?: None Help needed to walk in hospital room?: A Little Help needed climbing 3-5 steps with a railing? : A Little 6 Click Score: 22    End of Session Equipment Utilized During Treatment: Oxygen Activity Tolerance: Patient tolerated treatment well Patient left: in chair;with call bell/phone within reach Nurse Communication: Mobility status PT Visit  Diagnosis: Other abnormalities of gait and mobility (R26.89)    Time: 1250-1310 PT Time Calculation (min) (ACUTE ONLY): 20 min   Charges:   PT Evaluation $PT Eval Low Complexity: 1 Low        Ayanah Snader S, PT DPT Acute Rehabilitation Services Pager [phone]  Office [phone]  Roxine Caddy E Ruffin Pyo 03/22/2022, 4:01 PM

## 2022-03-22 NOTE — Progress Notes (Addendum)
PROGRESS NOTE    [name]  ACZ:[id number] DOB: 13-Jul-1933 DOA: 03/19/2022 PCP: Shirline Frees, MD    Brief Narrative:  [name] is a 88 y.o. female with hx of HTN, HLD, COPD, CVA hx, OA  SOB in ED, initially hypoxemic 74%, improved with O2.  Started on bipap for labile sat O2, hypercarbia.  Acute on chronic hypercarbia (CO2 on chem panel elevated to 40s). Slow to improve and still requiring O2.    Assessment and Plan:  Acute-on-chronic hypoxic and hypercarbic respiratory failure History of mild COPD - Continue supplemental O2 support - Wean O2 for sat > 90-92% - Bronchodilators as needed - Pulmonary hygiene -  repeat PFTs once recovered   Concern for CHF Mildly elevated BNP to 376. Previous Echo 10/2020 with EF 65-70%, mild concentric LVH, indeterminate diastolic parameters. Lasix '20mg'$  daily at home, previously on HCTZ (did well, she's unsure why this was discontinued). [facility] - Diuresis as tolerated- Lasix '40mg'$  IV - Monitor strict I&Os - Trend BNP - Repeat Echo -lasix PO daily -has appt with Dr. Tamala Julian 8/28   HTN Home regimen includes Norvasc 2.'5mg'$  daily,Toprol XL '25mg'$  daily.  - Continue home antihypertensives, consider metoprolol tartrate while critically ill - Hydralazine IV PRN for SBP > 150 - Cardiac monitoring   HLD Prior lacunar CVA - Continue ASA, statin  Obesity Estimated body mass index is 42.39 kg/m as calculated from the following:   Height as of this encounter: '4\' 11"'$  (1.499 m).   Weight as of this encounter: 95.2 kg.   Hyperkalemia -given lokelma BMP in AM   Repeat home o2 in AM    DVT prophylaxis: heparin injection 5,000 Units Start: 03/20/22 0600 SCDs Start: 03/19/22 2312    Code Status: Full Code Family Communication: LM for sister  Disposition Plan:  Level of care: Med-Surg Status is: Inpatient Remains inpatient appropriate because: needs to be weaned off O2    Consultants:  Tx from  PCCM   Subjective: Does not sleep well in the hospital  Objective: Vitals:   03/21/22 2107 03/22/22 0737 03/22/22 0805 03/22/22 1017  BP:   (!) 144/58   Pulse:   87   Resp:   14   Temp:   98.1 F (36.7 C)   TempSrc:   Oral   SpO2: 96% 94% 98% 98%  Weight:      Height:        Intake/Output Summary (Last 24 hours) at 03/22/2022 1339 Last data filed at 03/22/2022 1100 Gross per 24 hour  Intake 840 ml  Output --  Net 840 ml   Filed Weights   03/20/22 0007 03/20/22 0500 03/21/22 0500  Weight: 91.7 kg 91.7 kg 95.2 kg    Examination:    General: Appearance:    Severely obese female in no acute distress     Lungs:      respirations unlabored  Heart:    Normal heart rate.   MS:   All extremities are intact.   Neurologic:   Awake, alert         Data Reviewed: I have personally reviewed following labs and imaging studies  CBC: Recent Labs  Lab 03/19/22 1656 03/19/22 1707 03/19/22 1918 03/19/22 2217 03/20/22 0844 03/21/22 0117 03/22/22 0542  WBC 10.8*  --   --   --  9.0 7.0 11.4*  NEUTROABS 8.5*  --   --   --   --  6.1  --   HGB 15.9*   < >  16.7* 16.0* 14.0 15.1* 14.3  HCT 51.2*   < > 49.0* 47.0* 45.8 49.0* 47.6*  MCV 93.9  --   --   --  93.5 94.6 95.6  PLT 199  --   --   --  153 170 178   < > = values in this interval not displayed.   Basic Metabolic Panel: Recent Labs  Lab 03/19/22 1656 03/19/22 1707 03/19/22 1918 03/19/22 2217 03/20/22 0844 03/21/22 0117 03/22/22 0542  NA 133*   < > 133* 133* 138 136 139  K 4.9   < > 4.6 4.7 4.6 5.5* 5.1  CL 93*  --   --   --  92* 94* 92*  CO2 33*  --   --   --  38* 30 41*  GLUCOSE 129*  --   --   --  74 134* 95  BUN 23  --   --   --  24* 36* 25*  CREATININE 0.95  --   --   --  0.96 1.15* 0.93  CALCIUM 8.8*  --   --   --  8.5* 8.3* 9.1  MG  --   --   --   --  2.0  --   --   PHOS  --   --   --   --  5.1*  --   --    < > = values in this interval not displayed.   GFR: Estimated Creatinine Clearance: 43.1  mL/min (by C-G formula based on SCr of 0.93 mg/dL). Liver Function Tests: Recent Labs  Lab 03/19/22 1656 03/21/22 0117  AST 28 23  ALT 19 16  ALKPHOS 58 47  BILITOT 0.5 0.4  PROT 7.6 6.3*  ALBUMIN 4.0 3.2*   No results for input(s): "LIPASE", "AMYLASE" in the last 168 hours. No results for input(s): "AMMONIA" in the last 168 hours. Coagulation Profile: No results for input(s): "INR", "PROTIME" in the last 168 hours. Cardiac Enzymes: No results for input(s): "CKTOTAL", "CKMB", "CKMBINDEX", "TROPONINI" in the last 168 hours. BNP (last 3 results) No results for input(s): "PROBNP" in the last 8760 hours. HbA1C: Recent Labs    03/20/22 0844  HGBA1C 5.6   CBG: Recent Labs  Lab 03/21/22 1204 03/21/22 1612 03/21/22 2104 03/22/22 0808 03/22/22 1207  GLUCAP 112* 169* 136* 153* 94   Lipid Profile: No results for input(s): "CHOL", "HDL", "LDLCALC", "TRIG", "CHOLHDL", "LDLDIRECT" in the last 72 hours. Thyroid Function Tests: No results for input(s): "TSH", "T4TOTAL", "FREET4", "T3FREE", "THYROIDAB" in the last 72 hours. Anemia Panel: No results for input(s): "VITAMINB12", "FOLATE", "FERRITIN", "TIBC", "IRON", "RETICCTPCT" in the last 72 hours. Sepsis Labs: No results for input(s): "PROCALCITON", "LATICACIDVEN" in the last 168 hours.  No results found for this or any previous visit (from the past 240 hour(s)).       Radiology Studies: ECHOCARDIOGRAM COMPLETE  Result Date: 03/21/2022    ECHOCARDIOGRAM REPORT   Patient Name:   [name] Date of Exam: 03/21/2022 Medical Rec #:  [id number]     Height:       59.0 in Accession #:    [id number]    Weight:       209.9 lb Date of Birth:  October 18, 1932     BSA:          1.883 m Patient Age:    63 years      BP:           140/66 mmHg Patient Gender: F  HR:           87 bpm. Exam Location:  Inpatient Procedure: 2D Echo, Cardiac Doppler, Color Doppler and Intracardiac            Opacification Agent Indications:    CHF  History:         Patient has prior history of Echocardiogram examinations, most                 recent 10/22/2020. COPD; Risk Factors:Dyslipidemia and                 Hypertension.  Sonographer:    Maudry Mayhew MHA, RDMS, RVT, RDCS Referring Phys: [id number] Farheen Pfahler U [facility]  Sonographer Comments: Image acquisition challenging due to patient body habitus and Image acquisition challenging due to respiratory motion. IMPRESSIONS  1. Left ventricular ejection fraction, by estimation, is 60 to 65%. The left ventricle has normal function. The left ventricle has no regional wall motion abnormalities. Left ventricular diastolic function could not be evaluated.  2. Right ventricular systolic function is normal. The right ventricular size is normal. There is mildly elevated pulmonary artery systolic pressure.  3. The mitral valve is normal in structure. No evidence of mitral valve regurgitation. No evidence of mitral stenosis. Moderate mitral annular calcification.  4. The aortic valve is normal in structure. Aortic valve regurgitation is not visualized. No aortic stenosis is present.  5. The inferior vena cava is normal in size with greater than 50% respiratory variability, suggesting right atrial pressure of 3 mmHg. FINDINGS  Left Ventricle: Left ventricular ejection fraction, by estimation, is 60 to 65%. The left ventricle has normal function. The left ventricle has no regional wall motion abnormalities. The left ventricular internal cavity size was normal in size. There is  no left ventricular hypertrophy. Left ventricular diastolic function could not be evaluated due to mitral annular calcification (moderate or greater). Left ventricular diastolic function could not be evaluated. Right Ventricle: The right ventricular size is normal. No increase in right ventricular wall thickness. Right ventricular systolic function is normal. There is mildly elevated pulmonary artery systolic pressure. The tricuspid regurgitant velocity is 3.14  m/s,  and with an assumed right atrial pressure of 3 mmHg, the estimated right ventricular systolic pressure is 35.5 mmHg. Left Atrium: Left atrial size was normal in size. Right Atrium: Right atrial size was normal in size. Pericardium: There is no evidence of pericardial effusion. Presence of epicardial fat layer. Mitral Valve: The mitral valve is normal in structure. Moderate mitral annular calcification. No evidence of mitral valve regurgitation. No evidence of mitral valve stenosis. Tricuspid Valve: The tricuspid valve is normal in structure. Tricuspid valve regurgitation is not demonstrated. No evidence of tricuspid stenosis. Aortic Valve: The aortic valve is normal in structure. Aortic valve regurgitation is not visualized. No aortic stenosis is present. Aortic valve mean gradient measures 2.0 mmHg. Aortic valve peak gradient measures 3.3 mmHg. Aortic valve area, by VTI measures 1.67 cm. Pulmonic Valve: The pulmonic valve was normal in structure. Pulmonic valve regurgitation is not visualized. No evidence of pulmonic stenosis. Aorta: The aortic root is normal in size and structure. Venous: The inferior vena cava is normal in size with greater than 50% respiratory variability, suggesting right atrial pressure of 3 mmHg. IAS/Shunts: No atrial level shunt detected by color flow Doppler.  LEFT VENTRICLE PLAX 2D LVIDd:         5.40 cm     Diastology LVIDs:         3.60  cm     LV e' medial:    7.65 cm/s LV PW:         0.80 cm     LV E/e' medial:  12.6 LV IVS:        0.70 cm     LV e' lateral:   6.15 cm/s LVOT diam:     1.40 cm     LV E/e' lateral: 15.6 LV SV:         25 LV SV Index:   13 LVOT Area:     1.54 cm  LV Volumes (MOD) LV vol d, MOD A4C: 46.1 ml LV vol s, MOD A4C: 16.0 ml LV SV MOD A4C:     46.1 ml RIGHT VENTRICLE RV S prime:     12.10 cm/s LEFT ATRIUM         Index LA diam:    4.20 cm 2.23 cm/m  AORTIC VALVE AV Area (Vmax):    1.55 cm AV Area (Vmean):   1.47 cm AV Area (VTI):     1.67 cm AV Vmax:            91.40 cm/s AV Vmean:          62.600 cm/s AV VTI:            0.149 m AV Peak Grad:      3.3 mmHg AV Mean Grad:      2.0 mmHg LVOT Vmax:         92.00 cm/s LVOT Vmean:        59.900 cm/s LVOT VTI:          0.162 m LVOT/AV VTI ratio: 1.09  AORTA Ao Root diam: 1.90 cm MITRAL VALVE                TRICUSPID VALVE MV Area (PHT): 3.48 cm     TR Peak grad:   39.4 mmHg MV Decel Time: 218 msec     TR Vmax:        314.00 cm/s MV E velocity: 96.20 cm/s MV A velocity: 121.00 cm/s  SHUNTS MV E/A ratio:  0.80         Systemic VTI:  0.16 m                             Systemic Diam: 1.40 cm Kardie Tobb DO Electronically signed by Berniece Salines DO Signature Date/Time: 03/21/2022/3:17:57 PM    Final         Scheduled Meds:  amLODipine  2.5 mg Oral Daily   Chlorhexidine Gluconate Cloth  6 each Topical Q0600   furosemide  20 mg Oral Daily   heparin  5,000 Units Subcutaneous Q8H   insulin aspart  0-15 Units Subcutaneous TID WC   insulin aspart  0-5 Units Subcutaneous QHS   ipratropium-albuterol  3 mL Nebulization TID   predniSONE  40 mg Oral Q breakfast   Continuous Infusions:   LOS: 3 days    Time spent: 45 minutes spent on chart review, discussion with nursing staff, consultants, updating family and interview/physical exam; more than 50% of that time was spent in counseling and/or coordination of care.    Geradine Girt, DO [facility] Available via Epic secure chat 7am-7pm After these hours, please refer to coverage provider listed on [email or web address] 03/22/2022, 1:39 PM

## 2022-03-22 NOTE — Progress Notes (Signed)
Mobility Specialist Criteria Algorithm Info.   03/22/22 1600  Mobility  Activity Ambulated with assistance in hallway  Range of Motion/Exercises Active;All extremities  Level of Assistance Standby assist, set-up cues, supervision of patient - no hands on  Assistive Device Front wheel walker  Distance Ambulated (ft) 50 ft  Activity Response Tolerated well   Patient received in recliner chair agreeable to participate in mobility. Ambulated supervision level with slow steady gait. Distance limited by fatigue and was slightly dyspneic. SpO2 maintained >91% on 2LO2. Returned to room without complaint or incident. Was left in recliner with all needs met, call bell in reach.   03/22/2022 4:16 PM  [name], [place], [place]  FJUVQ:[phone] Office: [phone]

## 2022-03-22 NOTE — Progress Notes (Signed)
At start of shift nurse greeted pt in recliner chair with oxygen off. Nurse spot checked O2, pt sats at 87% on room air. Nurse educated pt on goal O2 saturation and weaning oxygen. Nurse placed pt back on 2L [place] with improvement of O2 at 95%. Will attempt to wean pt again during shift.

## 2022-03-23 DIAGNOSIS — J9601 Acute respiratory failure with hypoxia: Secondary | ICD-10-CM | POA: Diagnosis not present

## 2022-03-23 DIAGNOSIS — J9602 Acute respiratory failure with hypercapnia: Secondary | ICD-10-CM | POA: Diagnosis not present

## 2022-03-23 LAB — GLUCOSE, CAPILLARY
Glucose-Capillary: 76 mg/dL (ref 70–99)
Glucose-Capillary: 97 mg/dL (ref 70–99)

## 2022-03-23 LAB — BASIC METABOLIC PANEL
Anion gap: 5 (ref 5–15)
BUN: 22 mg/dL (ref 8–23)
CO2: 38 mmol/L — ABNORMAL HIGH (ref 22–32)
Calcium: 9 mg/dL (ref 8.9–10.3)
Chloride: 95 mmol/L — ABNORMAL LOW (ref 98–111)
Creatinine, Ser: 0.94 mg/dL (ref 0.44–1.00)
GFR, Estimated: 59 mL/min — ABNORMAL LOW (ref >60)
Glucose, Bld: 98 mg/dL (ref 70–99)
Potassium: 5.6 mmol/L — ABNORMAL HIGH (ref 3.5–5.1)
Sodium: 138 mmol/L (ref 135–145)

## 2022-03-23 MED ORDER — SODIUM ZIRCONIUM CYCLOSILICATE 5 G PO PACK
5.0000 g | PACK | Freq: Once | ORAL | Status: AC
  Administered 2022-03-23: 5 g via ORAL
  Filled 2022-03-23: qty 1

## 2022-03-23 MED ORDER — IPRATROPIUM-ALBUTEROL 0.5-2.5 (3) MG/3ML IN SOLN: 3.0000 mL | Freq: Two times a day (BID) | RESPIRATORY_TRACT | 1 refills | Status: DC

## 2022-03-23 MED ORDER — ALBUTEROL SULFATE (2.5 MG/3ML) 0.083% IN NEBU
2.5000 mg | INHALATION_SOLUTION | RESPIRATORY_TRACT | 1 refills | Status: DC | PRN
Start: 2022-03-23 — End: 2023-05-02

## 2022-03-23 MED ORDER — PREDNISONE 20 MG PO TABS: 40.0000 mg | ORAL_TABLET | Freq: Every day | ORAL | 0 refills | Status: DC

## 2022-03-23 MED ORDER — FUROSEMIDE 20 MG PO TABS: 20.0000 mg | ORAL_TABLET | Freq: Every day | ORAL | Status: DC

## 2022-03-23 NOTE — Progress Notes (Signed)
Patient Saturations on Room Air at Rest = 93   Patient Saturations on Room Air while Ambulating = 84-87%   Patient Saturations on 2 Liters of oxygen while Ambulating = 95%

## 2022-03-23 NOTE — Progress Notes (Cosign Needed)
    Durable Medical Equipment  (From admission, onward)           Start     Ordered   03/23/22 1042  For home use only DME oxygen  Once       Question Answer Comment  Length of Need Lifetime   Mode or (Route) Nasal cannula   Liters per Minute 2   Oxygen delivery system Gas      03/23/22 1041   03/22/22 1436  For home use only DME Nebulizer machine  Once       Question Answer Comment  Patient needs a nebulizer to treat with the following condition COPD (chronic obstructive pulmonary disease) (Wink)   Length of Need Lifetime      03/22/22 1435

## 2022-03-23 NOTE — Discharge Summary (Addendum)
Physician Discharge Summary  [name] RUE:[id number] DOB: Dec 20, 1932 DOA: 03/19/2022  PCP: Shirline Frees, MD  Admit date: 03/19/2022 Discharge date: 03/23/2022  Admitted From: home Discharge disposition: home   Recommendations for Outpatient Follow-Up:   Referral to pulm- outpatient PFTs Home O2 while ambulating BMP 1 week- started on daily lasix Consider sleep apnea eval 3 more days of steroids   Discharge Diagnosis:   Principal Problem:   Acute respiratory failure with hypoxia and hypercarbia (HCC) Active Problems:   Morbid obesity ([place])   COPD (chronic obstructive pulmonary disease) ([place])    Discharge Condition: Improved.  Diet recommendation: Low sodium, heart healthy  Wound care: None.  Code status: Full.   History of Present Illness:   88 year old woman who presented to [facility] ED 8/7 for SOB, LE edema. PMHx significant for HTN, HLD, COPD, prior CVA without residual deficits (lacunar infarct 10/2020), thyroid cyst, OA.   Patient initially presented to [facility] ED from her PCP office via EMS with complaints of SOB, LE edema and AMS.  SpO2 noted to be 74% on RA, improved to 88% on 6LNC.  Nebulizer administered with improvement in O2 sat to 98%; unfortunately SpO2 remained labile and NRB was applied. Subsequently placed on BiPAP with improved O2 sats and decreased WOB. VBG obtained with pCO2 77.6. ABG subsequently obtained with pCO2 100.1. CXR with vascular congestion and ?interstitial edema.   Repeat VBG with pCO2 96.8 despite BiPAP and mild AMS/confusion. PCCM consulted for admission for persistent hypercarbia.   Hospital Course by Problem:   Acute-on-chronic hypoxic and hypercarbic respiratory failure History of mild COPD - Continue supplemental O2 support - Wean O2 for sat > 90-92% - Bronchodilators as needed - Pulmonary hygiene - repeat PFTs once recovered   Concern for CHF Mildly elevated BNP to 376. Previous Echo 10/2020 with EF 65-70%, mild  concentric LVH, indeterminate diastolic parameters. Lasix '20mg'$  daily at home, previously on HCTZ (did well, she's unsure why this was discontinued). Previously seen by Dr. Tamala Julian [facility]). - Diuresis as tolerated- Lasix '40mg'$  IV-- change to PO lasix - Monitor strict I&Os - Echo: Left ventricular ejection fraction, by estimation, is 60 to 65%. The  left ventricle has normal function. The left ventricle has no regional  wall motion abnormalities. Left ventricular diastolic function could not  be evaluated.  -has appt with Dr. Tamala Julian 8/28   HTN Home regimen includes Norvasc 2.'5mg'$  daily,Toprol XL '25mg'$  daily.    HLD Prior lacunar CVA - Continue ASA, statin   Obesity Estimated body mass index is 42.39 kg/m as calculated from the following:   Height as of this encounter: '4\' 11"'$  (1.499 m).   Weight as of this encounter: 95.2 kg.    Hyperkalemia -given lokelma -now on lasix      Medical Consultants:   PCCM   Discharge Exam:   Vitals:   03/23/22 1003 03/23/22 1006  BP:    Pulse:    Resp:    Temp:    SpO2: (!) 87% 93%   Vitals:   03/23/22 0750 03/23/22 1000 03/23/22 1003 03/23/22 1006  BP: 137/65     Pulse: 87     Resp: 15     Temp: 98.2 F (36.8 C)     TempSrc: Oral     SpO2: 96% 93% (!) 87% 93%  Weight:      Height:        General exam: Appears calm and comfortable.      The results of  significant diagnostics from this hospitalization (including imaging, microbiology, ancillary and laboratory) are listed below for reference.     Procedures and Diagnostic Studies:   DG Chest Port 1 View  Result Date: 03/20/2022 CLINICAL DATA:  88 year old female with shortness of breath. EXAM: PORTABLE CHEST 1 VIEW COMPARISON:  Portable chest 03/19/2022 and earlier. FINDINGS: Portable AP semi upright view at 0531 hours. Stable lung volumes and mediastinal contours. Mildly to moderately improved ventilation at the lung bases, especially the left where there is regressed patchy  opacity. No pneumothorax or pulmonary edema. Small pleural effusions are possible. Calcified aortic atherosclerosis. Visualized tracheal air column is within normal limits. No acute osseous abnormality identified. Paucity of bowel gas in the upper abdomen. IMPRESSION: 1. Improved lung base ventilation since yesterday. Residual patchy opacity and possible small pleural effusions. 2. No new cardiopulmonary abnormality. Electronically Signed   By: Genevie Ann M.D.   On: 03/20/2022 05:55   DG Chest Portable 1 View  Result Date: 03/19/2022 CLINICAL DATA:  Shortness of breath EXAM: PORTABLE CHEST 1 VIEW COMPARISON:  10/23/2020 FINDINGS: Heart is normal size. Mild vascular congestion. Bilateral lower lobe airspace opacities. Diffuse interstitial prominence throughout the lungs. No acute bony abnormality. IMPRESSION: Mild vascular congestion and interstitial prominence which may reflect interstitial edema. Bibasilar atelectasis or infiltrates. Electronically Signed   By: Rolm Baptise M.D.   On: 03/19/2022 17:33     Labs:   Basic Metabolic Panel: Recent Labs  Lab 03/19/22 1656 03/19/22 1707 03/19/22 2217 03/20/22 0844 03/21/22 0117 03/22/22 0542 03/23/22 0554  NA 133*   < > 133* 138 136 139 138  K 4.9   < > 4.7 4.6 5.5* 5.1 5.6*  CL 93*  --   --  92* 94* 92* 95*  CO2 33*  --   --  38* 30 41* 38*  GLUCOSE 129*  --   --  74 134* 95 98  BUN 23  --   --  24* 36* 25* 22  CREATININE 0.95  --   --  0.96 1.15* 0.93 0.94  CALCIUM 8.8*  --   --  8.5* 8.3* 9.1 9.0  MG  --   --   --  2.0  --   --   --   PHOS  --   --   --  5.1*  --   --   --    < > = values in this interval not displayed.   GFR Estimated Creatinine Clearance: 42.6 mL/min (by C-G formula based on SCr of 0.94 mg/dL). Liver Function Tests: Recent Labs  Lab 03/19/22 1656 03/21/22 0117  AST 28 23  ALT 19 16  ALKPHOS 58 47  BILITOT 0.5 0.4  PROT 7.6 6.3*  ALBUMIN 4.0 3.2*   No results for input(s): "LIPASE", "AMYLASE" in the last 168  hours. No results for input(s): "AMMONIA" in the last 168 hours. Coagulation profile No results for input(s): "INR", "PROTIME" in the last 168 hours.  CBC: Recent Labs  Lab 03/19/22 1656 03/19/22 1707 03/19/22 1918 03/19/22 2217 03/20/22 0844 03/21/22 0117 03/22/22 0542  WBC 10.8*  --   --   --  9.0 7.0 11.4*  NEUTROABS 8.5*  --   --   --   --  6.1  --   HGB 15.9*   < > 16.7* 16.0* 14.0 15.1* 14.3  HCT 51.2*   < > 49.0* 47.0* 45.8 49.0* 47.6*  MCV 93.9  --   --   --  93.5  94.6 95.6  PLT 199  --   --   --  153 170 178   < > = values in this interval not displayed.   Cardiac Enzymes: No results for input(s): "CKTOTAL", "CKMB", "CKMBINDEX", "TROPONINI" in the last 168 hours. BNP: Invalid input(s): "POCBNP" CBG: Recent Labs  Lab 03/22/22 1207 03/22/22 1700 03/22/22 2008 03/23/22 0752 03/23/22 1110  GLUCAP 94 181* 187* 76 97   D-Dimer No results for input(s): "DDIMER" in the last 72 hours. Hgb A1c No results for input(s): "HGBA1C" in the last 72 hours. Lipid Profile No results for input(s): "CHOL", "HDL", "LDLCALC", "TRIG", "CHOLHDL", "LDLDIRECT" in the last 72 hours. Thyroid function studies No results for input(s): "TSH", "T4TOTAL", "T3FREE", "THYROIDAB" in the last 72 hours.  Invalid input(s): "FREET3" Anemia work up No results for input(s): "VITAMINB12", "FOLATE", "FERRITIN", "TIBC", "IRON", "RETICCTPCT" in the last 72 hours. Microbiology No results found for this or any previous visit (from the past 240 hour(s)).   Discharge Instructions:   Discharge Instructions     Ambulatory referral to Pulmonology   Complete by: As directed    Reason for referral: Asthma/COPD   Diet - low sodium heart healthy   Complete by: As directed    Discharge instructions   Complete by: As directed    2L O2 when ambulating- do not need at rest currently Referral to pulmonary for PFTs Would get pulse oximetry for home to monitor O2 sats BMP 1 week-- take lasix daily-- will  have PCP adjust From chart review it appears neurologist placed you on rosuvastatin-- will defer to PCP to change and monitor your LDL (cholesterol)   Increase activity slowly   Complete by: As directed       Allergies as of 03/23/2022       Reactions   Atorvastatin Other (See Comments)   Myalgias   Ceftin [cefuroxime] Diarrhea   Cefuroxime Axetil Diarrhea   Clindamycin Hcl Itching   Clindamycin/lincomycin Itching   Moxifloxacin Itching   Simvastatin Other (See Comments)   myalgias   Doxycycline Hyclate Diarrhea, Rash   Penicillins Rash        Medication List     TAKE these medications    albuterol (2.5 MG/3ML) 0.083% nebulizer solution Commonly known as: PROVENTIL Take 3 mLs (2.5 mg total) by nebulization every 4 (four) hours as needed for wheezing or shortness of breath.   amLODipine 2.5 MG tablet Commonly known as: NORVASC Take 2.5 mg by mouth daily.   aspirin EC 81 MG tablet Take 1 tablet (81 mg total) by mouth daily. Swallow whole.   docusate sodium 100 MG capsule Commonly known as: COLACE Take 1 capsule (100 mg total) by mouth daily as needed for mild constipation.   furosemide 20 MG tablet Commonly known as: LASIX Take 1 tablet (20 mg total) by mouth daily. Start taking on: March 24, 2022 What changed:  when to take this reasons to take this   ipratropium-albuterol 0.5-2.5 (3) MG/3ML Soln Commonly known as: DUONEB Take 3 mLs by nebulization 2 (two) times daily.   Metoprolol Succinate 25 MG Cs24 Take 25 mg by mouth daily.   predniSONE 20 MG tablet Commonly known as: DELTASONE Take 2 tablets (40 mg total) by mouth daily with breakfast. Start taking on: March 24, 2022   rosuvastatin 10 MG tablet Commonly known as: CRESTOR Take 10 mg by mouth daily.   VITAMIN D PO Take 1,000 Units by mouth daily.  Durable Medical Equipment  (From admission, onward)           Start     Ordered   03/23/22 1158  For home use only  DME oxygen  Once       Question Answer Comment  Length of Need Lifetime   Mode or (Route) Nasal cannula   Liters per Minute 2   Frequency Continuous (stationary and portable oxygen unit needed)   Oxygen conserving device Yes   Oxygen delivery system Gas      03/23/22 1158   03/22/22 1436  For home use only DME Nebulizer machine  Once       Question Answer Comment  Patient needs a nebulizer to treat with the following condition COPD (chronic obstructive pulmonary disease) ([place])   Length of Need Lifetime      03/22/22 1435            Follow-up Information     Shirline Frees, MD. Schedule an appointment as soon as possible for a visit.   Specialty: Family Medicine Why: Please call the office and schedule a follow up appointment at the clinic in the next 7 days once you are discharged home from the hospital. Contact information: [place] [address] [place] [facility] Follow up.   Why: Nebulizer machine will be delivered to the hospital room prior to your discharge home. Contact information: [place] [address] [phone]         [facility] [place] [facility] [place] Follow up.   Why: [facility] nursing will call you to set up a post-hospitalization visit at the home through [place], home health RN. Contact information: [phone]                 Time coordinating discharge: 45 min  Signed:  Geradine Girt DO  [facility] 03/23/2022, 2:52 PM

## 2022-03-23 NOTE — Discharge Instructions (Signed)
Would recommend a pulse ox to monitor O2 saturations at home

## 2022-03-23 NOTE — TOC Progression Note (Addendum)
Transition of Care [facility]) - Progression Note    Patient Details  Name: [name] MRN: [id number] Date of Birth: 1933-02-15  Transition of Care [facility]) CM/SW [place], RN Phone Number: 03/23/2022, 8:13 AM  Clinical Narrative:    CM called and spoke with Bethanne Ginger, CM with [employer] to order nebulizer machine and supplies delivered to the patient's hospital room today.  I spoke with the patient yesterday and she did not have a preference regarding DME company.  03/23/2022 - 1124 - Oxygen Saturation screening was completed by bedside nursing and attending physician, Dr. Eliseo Squires is aware.  Will wait for home O2 orders and follow up with [employer] this morning.  I called [facility] at [phone] and they transferred me to [facility] and [place] agency - I spoke with the coordinator on the phone and explained the patient was admitted for respiratory failure and will be discharged home with new nebulizer machine and home O2.  Polly, RN with [facility] will call the patient and schedule a home visit post-hospitalization since the patient is requiring new nebulizer machine, nebulizer medications and home oxygen.  I will send discharge summary to [facility] office fax for updated clinicals for care at the home - no home health order is needed per [facility] physician office.  Hal Neer, niece is present in the room and aware of patient's discharge plan today.  Tami Ribas is Psychiatric nurse at [place] now to schedule a PCP visit this week with Dr. Shirline Frees and is aware that nebulizer machine and home oxygen will be delivered to the hospital room before the patient can discharge home by car today.  Discharge meds were sent through [facility] and the niece will pick up medications for the patient today and transportation home by car once oxygen arrives.  Bedside nursing is aware.  CM will continue to follow the patient for discharge needs to home.   Expected Discharge  Plan: Home/Self Care Barriers to Discharge: Continued Medical Work up  Expected Discharge Plan and Services Expected Discharge Plan: Home/Self Care   Discharge Planning Services: CM Consult Post Acute Care Choice:  (Patient with pending evaluation for Oxygen needs for home.) Living arrangements for the past 2 months: Single Family Home                                       Social Determinants of Health (SDOH) Interventions    Readmission Risk Interventions     No data to display

## 2022-04-08 NOTE — Progress Notes (Unsigned)
Cardiology Office Note:    Date:  04/09/2022   ID:  [name] 1933-06-06, MRN [id number]  PCP:  Harlan Stains, MD  Cardiologist:  Sinclair Grooms, MD   Referring MD: Shirline Frees, MD   Chief Complaint  Patient presents with   Congestive Heart Failure   Hypertension   Follow-up    Obesity COPD    History of Present Illness:    [name] is a 88 y.o. female with a hx of COPD, primary hypertension, DOE, and obesity.  She was admitted to the hospital August 7 with respiratory failure, severe hypoxia, and hypercapnia.  On admission pH was 7.18, PCO2 100.1, PO2 was 89.  She required BiPAP but did not require intubation.  She was discharged on continuous oxygen for hypoxic hypercarbic respiratory failure with the statement to wean for O2 saturation greater than 90%.  During hospital stay, BNP was greater than 350.  She drove herself to the office today.  States that she has lower extremity swelling that is better in the mornings but worse in the evenings.  She feels dyspnea/orthopnea with lying down.  She has been sleeping propped up.  Denies cough and chest pain.  No prior history of heart failure.  Prior smoker.  History of COPD.  Past Medical History:  Diagnosis Date   Acute back pain with sciatica, unspecified laterality    Allergic rhinitis    BMI 40.0-44.9, adult (HCC)    Cerebrovascular disease    Chronic diastolic (congestive) heart failure (HCC)    Colon polyp    COPD (chronic obstructive pulmonary disease) (HCC)    MILD   Disorder of bone    Estrogen deficiency    Excessive sleepiness    Hyperlipidemia    Hypertension    Incontinence of urine    Irregular heartbeat    Low back pain    Morbid obesity (HCC)    OA (osteoarthritis)    OF THE KNEES   OAB (overactive bladder)    Osteopenia    Osteopenia    Primary osteoarthritis of both knees    Pure hypercholesterolemia    Shingles    Shingles    Stroke ([place])    Thyroid cyst    Thyroid  cyst    Ulnar neuropathy of both upper extremities    Vertigo     Past Surgical History:  Procedure Laterality Date   BREAST EXCISIONAL BIOPSY Right    benign    Current Medications: Current Meds  Medication Sig   albuterol (PROVENTIL) (2.5 MG/3ML) 0.083% nebulizer solution Take 3 mLs (2.5 mg total) by nebulization every 4 (four) hours as needed for wheezing or shortness of breath.   amLODipine (NORVASC) 2.5 MG tablet Take 2.5 mg by mouth daily.   aspirin EC 81 MG EC tablet Take 1 tablet (81 mg total) by mouth daily. Swallow whole.   Cholecalciferol (VITAMIN D PO) Take 1,000 Units by mouth daily.   dapagliflozin propanediol (FARXIGA) 10 MG TABS tablet Take 1 tablet (10 mg total) by mouth daily before breakfast.   docusate sodium (COLACE) 100 MG capsule Take 100 mg by mouth daily.   furosemide (LASIX) 20 MG tablet Take 1 tablet (20 mg total) by mouth daily.   ipratropium-albuterol (DUONEB) 0.5-2.5 (3) MG/3ML SOLN Take 3 mLs by nebulization 2 (two) times daily.   Metoprolol Succinate 25 MG CS24 Take 25 mg by mouth daily.   OXYGEN Inhale into the lungs as directed.   rosuvastatin (CRESTOR) 10  MG tablet Take 20 mg by mouth daily.     Allergies:   Atorvastatin, Ceftin [cefuroxime], Cefuroxime axetil, Clindamycin hcl, Clindamycin/lincomycin, Moxifloxacin, Simvastatin, Doxycycline hyclate, and Penicillins   Social History   Socioeconomic History   Marital status: Widowed    Spouse name: Not on file   Number of children: Not on file   Years of education: Not on file   Highest education level: Not on file  Occupational History   Not on file  Tobacco Use   Smoking status: Former   Smokeless tobacco: Never  Vaping Use   Vaping Use: Never used  Substance and Sexual Activity   Alcohol use: Not Currently   Drug use: Never   Sexual activity: Not on file  Other Topics Concern   Not on file  Social History Narrative   Lives alone   Right Handed   Drinks decaf   Social  Determinants of Health   Financial Resource Strain: Not on file  Food Insecurity: Not on file  Transportation Needs: Not on file  Physical Activity: Not on file  Stress: Not on file  Social Connections: Not on file     Family History: The patient's family history includes CAD in her father. There is no history of Breast cancer.  ROS:   Please see the history of present illness.    Loquacious.  Has not been wearing oxygen as instructed.  Lives independently.  Denies cough, chills, and fever.  Was having some tingling in her feet and hands that she felt was related to rosuvastatin.  She continues to take rosuvastatin and the numbness has resolved.  All other systems reviewed and are negative.  EKGs/Labs/Other Studies Reviewed:    The following studies were reviewed today:   ECHOCARDIOGRAM 03/2022: IMPRESSIONS    1. Left ventricular ejection fraction, by estimation, is 60 to 65%. The  left ventricle has normal function. The left ventricle has no regional  wall motion abnormalities. Left ventricular diastolic function could not  be evaluated.   2. Right ventricular systolic function is normal. The right ventricular  size is normal. There is mildly elevated pulmonary artery systolic  pressure.   3. The mitral valve is normal in structure. No evidence of mitral valve  regurgitation. No evidence of mitral stenosis. Moderate mitral annular  calcification.   4. The aortic valve is normal in structure. Aortic valve regurgitation is  not visualized. No aortic stenosis is present.   5. The inferior vena cava is normal in size with greater than 50%  respiratory variability, suggesting right atrial pressure of 3 mmHg.   EKG:  EKG performed on 03/19/2022 demonstrated biatrial abnormality but otherwise unremarkable tracing.  Recent Labs: 03/19/2022: B Natriuretic Peptide 376.4 03/20/2022: Magnesium 2.0 03/21/2022: ALT 16 03/22/2022: Hemoglobin 14.3; Platelets 178 03/23/2022: BUN 22; Creatinine,  Ser 0.94; Potassium 5.6; Sodium 138  Recent Lipid Panel    Component Value Date/Time   CHOL 165 08/31/2021 1007   TRIG 105 08/31/2021 1007   HDL 53 08/31/2021 1007   CHOLHDL 3.1 08/31/2021 1007   CHOLHDL 3.6 10/24/2020 0135   VLDL 17 10/24/2020 0135   LDLCALC 93 08/31/2021 1007    Physical Exam:    VS:  BP 132/62   Pulse 97   Ht '4\' 11"'$  (1.499 m)   Wt 202 lb (91.6 kg)   SpO2 90%   BMI 40.80 kg/m     Wt Readings from Last 3 Encounters:  04/09/22 202 lb (91.6 kg)  03/21/22 209 lb 14.1  oz (95.2 kg)  01/25/21 193 lb 9.6 oz (87.8 kg)     GEN: Morbid. No acute distress HEENT: Normal NECK: Elevated JVD above the clavicle with the patient sitting at 90 degrees.Marland Kitchen LYMPHATICS: No lymphadenopathy CARDIAC: No murmur. RRR S4 but no S3 gallop and tense bilateral lower extremity edema. VASCULAR:  Normal Pulses. No bruits. RESPIRATORY:  Clear to auscultation without rales, wheezing or rhonchi  ABDOMEN: Soft, non-tender, non-distended, No pulsatile mass, MUSCULOSKELETAL: No deformity  SKIN: Warm and dry NEUROLOGIC:  Alert and oriented x 3 PSYCHIATRIC:  Normal affect   ASSESSMENT:    1. Chronic diastolic heart failure ([place])   2. Essential hypertension   3. Chronic respiratory failure with hypoxia and hypercapnia (HCC)    PLAN:    In order of problems listed above:  There is evidence today of volume overload.  We will add SGLT2 therapy to her current dose of furosemide 20 mg/day.  This would be either Jardiance or Farxiga 10 mg/day.  She needs a basic metabolic panel in 2 weeks.  We should also repeat a brain natruretic peptide at that time. Blood pressure today will tolerate. Encouraged to wear oxygen as recommended at discharge.  Oxygen saturation today on no O2 therapy was 90%.    Guideline directed therapy for left ventricular systolic dysfunction: SGLT-2 agents -  Dapagliflozin [place]) or Empagliflozin (Jardiance).These therapies have been shown to improve clinical  outcomes including reduction of rehospitalization, survival, and acute heart failure.  BMP and BN P in 10 to 14 days.  Follow-up in office in 1 month.  Hopefully the combination of loop diuretic and SGLT2 therapy will give additional volume removal and improve any cardiac component of her respiratory failure.  Medication Adjustments/Labs and Tests Ordered: Current medicines are reviewed at length with the patient today.  Concerns regarding medicines are outlined above.  Orders Placed This Encounter  Procedures   Basic metabolic panel   Meds ordered this encounter  Medications   dapagliflozin propanediol (FARXIGA) 10 MG TABS tablet    Sig: Take 1 tablet (10 mg total) by mouth daily before breakfast.    Dispense:  30 tablet    Refill:  11    Patient Instructions  Medication Instructions:  Your physician has recommended you make the following change in your medication:   1) START dapagliflozin propanediol (Farxiga) '10mg'$  daily (in the morning)  **Please wear oxygen at all times.**  *If you need a refill on your cardiac medications before your next appointment, please call your pharmacy*  Lab Work: In 2 weeks: BMET If you have labs (blood work) drawn today and your tests are completely normal, you will receive your results only by: [place] (if you have MyChart) OR A paper copy in the mail If you have any lab test that is abnormal or we need to change your treatment, we will call you to review the results.  Testing/Procedures: NONE  Follow-Up: At [facility], you and your health needs are our priority.  As part of our continuing mission to provide you with exceptional heart care, we have created designated Provider Care Teams.  These Care Teams include your primary Cardiologist (physician) and Advanced Practice Providers (APPs -  Physician Assistants and Nurse Practitioners) who all work together to provide you with the care you need, when you need it.  Your next  appointment:   1 month(s)  The format for your next appointment:   In Person  Provider:   Sinclair Grooms, MD  or Robbie Lis, PA-C, Christen Bame, NP, or Richardson Dopp, PA-C     Important Information About Sugar         Signed, Sinclair Grooms, MD  04/09/2022 11:50 AM    [place]

## 2022-04-09 ENCOUNTER — Ambulatory Visit: Payer: Medicare Other | Attending: Interventional Cardiology | Admitting: Interventional Cardiology

## 2022-04-09 ENCOUNTER — Encounter: Payer: Self-pay | Admitting: Interventional Cardiology

## 2022-04-09 VITALS — BP 132/62 | HR 97 | Ht 59.0 in | Wt 202.0 lb

## 2022-04-09 DIAGNOSIS — I10 Essential (primary) hypertension: Secondary | ICD-10-CM | POA: Diagnosis not present

## 2022-04-09 DIAGNOSIS — I5032 Chronic diastolic (congestive) heart failure: Secondary | ICD-10-CM

## 2022-04-09 DIAGNOSIS — J9611 Chronic respiratory failure with hypoxia: Secondary | ICD-10-CM | POA: Diagnosis not present

## 2022-04-09 DIAGNOSIS — J9612 Chronic respiratory failure with hypercapnia: Secondary | ICD-10-CM

## 2022-04-09 MED ORDER — DAPAGLIFLOZIN PROPANEDIOL 10 MG PO TABS: 10.0000 mg | ORAL_TABLET | Freq: Every day | ORAL | 11 refills | Status: DC

## 2022-04-09 NOTE — Patient Instructions (Addendum)
Medication Instructions:  Your physician has recommended you make the following change in your medication:   1) START dapagliflozin propanediol (Farxiga) '10mg'$  daily (in the morning)  **Please wear oxygen at all times.**  *If you need a refill on your cardiac medications before your next appointment, please call your pharmacy*  Lab Work: In 2 weeks: BMET, BNP If you have labs (blood work) drawn today and your tests are completely normal, you will receive your results only by: [place] (if you have MyChart) OR A paper copy in the mail If you have any lab test that is abnormal or we need to change your treatment, we will call you to review the results.  Testing/Procedures: NONE  Follow-Up: At [facility], you and your health needs are our priority.  As part of our continuing mission to provide you with exceptional heart care, we have created designated Provider Care Teams.  These Care Teams include your primary Cardiologist (physician) and Advanced Practice Providers (APPs -  Physician Assistants and Nurse Practitioners) who all work together to provide you with the care you need, when you need it.  Your next appointment:   1 month(s)  The format for your next appointment:   In Person  Provider:   Sinclair Grooms, MD  or Robbie Lis, PA-C, Christen Bame, NP, or Richardson Dopp, PA-C     Important Information About Sugar

## 2022-04-17 ENCOUNTER — Other Ambulatory Visit: Payer: Self-pay | Admitting: Family Medicine

## 2022-04-17 DIAGNOSIS — I739 Peripheral vascular disease, unspecified: Secondary | ICD-10-CM

## 2022-04-20 ENCOUNTER — Encounter: Payer: Self-pay | Admitting: Pulmonary Disease

## 2022-04-20 ENCOUNTER — Ambulatory Visit: Payer: Medicare Other | Admitting: Pulmonary Disease

## 2022-04-20 VITALS — BP 150/100 | HR 105 | Ht 59.0 in | Wt 202.0 lb

## 2022-04-20 DIAGNOSIS — J432 Centrilobular emphysema: Secondary | ICD-10-CM | POA: Diagnosis not present

## 2022-04-20 DIAGNOSIS — J9602 Acute respiratory failure with hypercapnia: Secondary | ICD-10-CM

## 2022-04-20 DIAGNOSIS — J9601 Acute respiratory failure with hypoxia: Secondary | ICD-10-CM | POA: Diagnosis not present

## 2022-04-20 DIAGNOSIS — I5032 Chronic diastolic (congestive) heart failure: Secondary | ICD-10-CM | POA: Diagnosis not present

## 2022-04-20 MED ORDER — BUDESONIDE 0.25 MG/2ML IN SUSP: 0.2500 mg | Freq: Two times a day (BID) | RESPIRATORY_TRACT | 12 refills | Status: DC

## 2022-04-20 NOTE — Progress Notes (Signed)
Synopsis: Referred in September 2023 for Respiratory Failure by Harlan Stains, MD  Subjective:   PATIENT ID: [name] GENDER: female DOB: [birth year], MRN: [id number]   HPI  Chief Complaint  Patient presents with   [place]. follow-up   [name] is an 88 year old woman, former smoker with history of CVA, obesity, diastolic heart failure and COPD who is referred to pulmonary clinic for respiratory failure.   She was admitted 8/7 to 8/11 for acute hypoxemic and hypercapnic respiratory failure with altered mentation. She was treated with Bipap and diuresis with improvement in her condition. She has been feeling much better since discharge. She saw Dr. Tamala Julian of cardiology 04/09/22, note reviewed  She has oxygen at home and using 2L throughout the day. She is not using it at night. She is doing duoneb treatments twice daily.   She tried CPAP at the hospital but she felt claustrophobic and did not tolerate it.   She quit smoking in 1973, she smoked for 10 years. She worked at [employer] previously and [employer]. She had second hand smoke at home and at work.   Past Medical History:  Diagnosis Date   Acute back pain with sciatica, unspecified laterality    Allergic rhinitis    BMI 40.0-44.9, adult (HCC)    Cerebrovascular disease    Chronic diastolic (congestive) heart failure (HCC)    Colon polyp    COPD (chronic obstructive pulmonary disease) (HCC)    MILD   Disorder of bone    Estrogen deficiency    Excessive sleepiness    Hyperlipidemia    Hypertension    Incontinence of urine    Irregular heartbeat    Low back pain    Morbid obesity (HCC)    OA (osteoarthritis)    OF THE KNEES   OAB (overactive bladder)    Osteopenia    Osteopenia    Primary osteoarthritis of both knees    Pure hypercholesterolemia    Shingles    Shingles    Stroke (HCC)    Thyroid cyst    Thyroid cyst    Ulnar neuropathy of both upper extremities    Vertigo      Family History  Problem  Relation Age of Onset   CAD Father    Breast cancer Neg Hx      Social History   Socioeconomic History   Marital status: Widowed    Spouse name: Not on file   Number of children: Not on file   Years of education: Not on file   Highest education level: Not on file  Occupational History   Not on file  Tobacco Use   Smoking status: Former   Smokeless tobacco: Never  Vaping Use   Vaping Use: Never used  Substance and Sexual Activity   Alcohol use: Not Currently   Drug use: Never   Sexual activity: Not on file  Other Topics Concern   Not on file  Social History Narrative   Lives alone   Right Handed   Drinks decaf   Social Determinants of Health   Financial Resource Strain: Not on file  Food Insecurity: Not on file  Transportation Needs: Not on file  Physical Activity: Not on file  Stress: Not on file  Social Connections: Not on file  Intimate Partner Violence: Not on file     Allergies  Allergen Reactions   Atorvastatin Other (See Comments)    Myalgias   Ceftin [Cefuroxime] Diarrhea  Cefuroxime Axetil Diarrhea   Clindamycin Hcl Itching   Clindamycin/Lincomycin Itching   Moxifloxacin Itching   Simvastatin Other (See Comments)    myalgias   Doxycycline Hyclate Diarrhea and Rash   Penicillins Rash     Outpatient Medications Prior to Visit  Medication Sig Dispense Refill   albuterol (PROVENTIL) (2.5 MG/3ML) 0.083% nebulizer solution Take 3 mLs (2.5 mg total) by nebulization every 4 (four) hours as needed for wheezing or shortness of breath. 75 mL 1   ipratropium-albuterol (DUONEB) 0.5-2.5 (3) MG/3ML SOLN Take 3 mLs by nebulization 2 (two) times daily. 160 mL 1   amLODipine (NORVASC) 2.5 MG tablet Take 2.5 mg by mouth daily.     aspirin EC 81 MG EC tablet Take 1 tablet (81 mg total) by mouth daily. Swallow whole. 30 tablet 11   Cholecalciferol (VITAMIN D PO) Take 1,000 Units by mouth daily.     dapagliflozin propanediol (FARXIGA) 10 MG TABS tablet Take 1 tablet  (10 mg total) by mouth daily before breakfast. 30 tablet 11   docusate sodium (COLACE) 100 MG capsule Take 100 mg by mouth daily.     furosemide (LASIX) 20 MG tablet Take 1 tablet (20 mg total) by mouth daily. 30 tablet    Metoprolol Succinate 25 MG CS24 Take 25 mg by mouth daily.     OXYGEN Inhale into the lungs as directed. 2L     rosuvastatin (CRESTOR) 10 MG tablet Take 20 mg by mouth daily.     No facility-administered medications prior to visit.    Review of Systems  Constitutional:  Negative for chills, fever, malaise/fatigue and weight loss.  HENT:  Negative for congestion, sinus pain and sore throat.   Eyes: Negative.   Respiratory:  Positive for shortness of breath. Negative for cough, hemoptysis, sputum production and wheezing.   Cardiovascular:  Positive for leg swelling. Negative for chest pain, palpitations, orthopnea and claudication.  Gastrointestinal:  Negative for abdominal pain, heartburn, nausea and vomiting.  Genitourinary: Negative.   Musculoskeletal:  Negative for joint pain and myalgias.  Skin:  Negative for rash.  Neurological:  Negative for weakness.  Endo/Heme/Allergies: Negative.   Psychiatric/Behavioral: Negative.     Objective:   Vitals:   04/20/22 1353  BP: (!) 150/100  Pulse: (!) 105  SpO2: 90%  Weight: 202 lb (91.6 kg)  Height: '4\' 11"'$  (1.499 m)     Physical Exam Constitutional:      General: She is not in acute distress.    Appearance: She is obese. She is not ill-appearing.  HENT:     Head: Normocephalic and atraumatic.  Eyes:     General: No scleral icterus.    Conjunctiva/sclera: Conjunctivae normal.     Pupils: Pupils are equal, round, and reactive to light.  Cardiovascular:     Rate and Rhythm: Normal rate and regular rhythm.     Pulses: Normal pulses.     Heart sounds: Normal heart sounds. No murmur heard. Pulmonary:     Effort: Pulmonary effort is normal.     Breath sounds: Normal breath sounds. Decreased air movement present.  No wheezing, rhonchi or rales.  Abdominal:     General: Bowel sounds are normal.     Palpations: Abdomen is soft.  Musculoskeletal:     Right lower leg: No edema.     Left lower leg: No edema.  Lymphadenopathy:     Cervical: No cervical adenopathy.  Skin:    General: Skin is warm and dry.  Neurological:  General: No focal deficit present.     Mental Status: She is alert.  Psychiatric:        Mood and Affect: Mood normal.        Behavior: Behavior normal.        Thought Content: Thought content normal.        Judgment: Judgment normal.    CBC    Component Value Date/Time   WBC 11.4 (H) 03/22/2022 0542   RBC 4.98 03/22/2022 0542   HGB 14.3 03/22/2022 0542   HCT 47.6 (H) 03/22/2022 0542   PLT 178 03/22/2022 0542   MCV 95.6 03/22/2022 0542   MCH 28.7 03/22/2022 0542   MCHC 30.0 03/22/2022 0542   RDW 15.1 03/22/2022 0542   LYMPHSABS 0.5 (L) 03/21/2022 0117   MONOABS 0.3 03/21/2022 0117   EOSABS 0.0 03/21/2022 0117   BASOSABS 0.0 03/21/2022 0117      Latest Ref Rng & Units 03/23/2022    5:54 AM 03/22/2022    5:42 AM 03/21/2022    1:17 AM  BMP  Glucose 70 - 99 mg/dL 98  95  134   BUN 8 - 23 mg/dL 22  25  36   Creatinine 0.44 - 1.00 mg/dL 0.94  0.93  1.15   Sodium 135 - 145 mmol/L 138  139  136   Potassium 3.5 - 5.1 mmol/L 5.6  5.1  5.5   Chloride 98 - 111 mmol/L 95  92  94   CO2 22 - 32 mmol/L 38  41  30   Calcium 8.9 - 10.3 mg/dL 9.0  9.1  8.3    Chest imaging: CXR 03/20/22 1. Improved lung base ventilation since yesterday. Residual patchy opacity and possible small pleural effusions. 2. No new cardiopulmonary abnormality.  CT Chest 06/03/06 Emphysema noted  PFT:     No data to display          Labs:  Path:  Echo 03/21/22: LV EF 60-65%. RV systolic function and size is normal. Mildly elevated PASP. LA and RA normal size.   Heart Catheterization:  Assessment & Plan:   Centrilobular emphysema ([place]) - Plan: budesonide (PULMICORT) 0.25 MG/2ML  nebulizer solution, Pulse oximetry, overnight  Acute respiratory failure with hypoxia and hypercapnia (HCC) - Plan: Pulmonary Function Test, Pulse oximetry, overnight  Chronic diastolic heart failure (HCC)  Discussion: [name] is an 88 year old woman, former smoker with history of CVA, obesity, diastolic heart failure and COPD who is referred to pulmonary clinic for respiratory failure.   She has history of emphysema as noted on CT Chest scan in 2007. We will check pulmonary function tests.  She is to continue duoneb nebulizer treatments twice daily and we will start her on budesonide nebulizer 0.'25mg'$  twice daily for ICS/LAMA/LABA therapy.   She did not tolerate Bipap mask at the hospital due to feeling claustrophobic so we will hold off on sleep testing.   We will check overnight oximetry testing. She desaturated to 84% on simple walk today. We will order her a portable oxygen concentrator at 2-3L pulsed.   Follow up in 4 months with PFTs.  Freda Jackson, MD [facility] Office: [phone]   Current Outpatient Medications:    albuterol (PROVENTIL) (2.5 MG/3ML) 0.083% nebulizer solution, Take 3 mLs (2.5 mg total) by nebulization every 4 (four) hours as needed for wheezing or shortness of breath., Disp: 75 mL, Rfl: 1   budesonide (PULMICORT) 0.25 MG/2ML nebulizer solution, Take 2 mLs (0.25 mg total)  by nebulization in the morning and at bedtime., Disp: 60 mL, Rfl: 12   ipratropium-albuterol (DUONEB) 0.5-2.5 (3) MG/3ML SOLN, Take 3 mLs by nebulization 2 (two) times daily., Disp: 160 mL, Rfl: 1   amLODipine (NORVASC) 2.5 MG tablet, Take 2.5 mg by mouth daily., Disp: , Rfl:    aspirin EC 81 MG EC tablet, Take 1 tablet (81 mg total) by mouth daily. Swallow whole., Disp: 30 tablet, Rfl: 11   Cholecalciferol (VITAMIN D PO), Take 1,000 Units by mouth daily., Disp: , Rfl:    dapagliflozin propanediol (FARXIGA) 10 MG TABS tablet, Take 1 tablet (10 mg total) by mouth  daily before breakfast., Disp: 30 tablet, Rfl: 11   docusate sodium (COLACE) 100 MG capsule, Take 100 mg by mouth daily., Disp: , Rfl:    furosemide (LASIX) 20 MG tablet, Take 1 tablet (20 mg total) by mouth daily., Disp: 30 tablet, Rfl:    Metoprolol Succinate 25 MG CS24, Take 25 mg by mouth daily., Disp: , Rfl:    OXYGEN, Inhale into the lungs as directed. 2L, Disp: , Rfl:    rosuvastatin (CRESTOR) 10 MG tablet, Take 20 mg by mouth daily., Disp: , Rfl:

## 2022-04-20 NOTE — Addendum Note (Signed)
Addended by: Alvin Critchley on: 04/20/2022 03:39 PM   Modules accepted: Orders

## 2022-04-20 NOTE — Addendum Note (Signed)
Addended by: Freda Jackson on: 04/20/2022 03:40 PM   Modules accepted: Orders

## 2022-04-20 NOTE — Patient Instructions (Addendum)
Continue duoneb nebulizer treatments twice daily  Start budesonide nebulizer treatments twice daily  We will check an overnight oxygen test  We will check a simple walk in clinic to get you qualified for a portable oxygen concentrator.   Follow up in 4 months with breathing tests

## 2022-04-23 ENCOUNTER — Ambulatory Visit
Admission: RE | Admit: 2022-04-23 | Discharge: 2022-04-23 | Disposition: A | Discharge status: Home / Self Care | Payer: Medicare Other | Source: Ambulatory Visit | Attending: Family Medicine | Admitting: Family Medicine

## 2022-04-23 DIAGNOSIS — I739 Peripheral vascular disease, unspecified: Secondary | ICD-10-CM

## 2022-04-24 ENCOUNTER — Ambulatory Visit: Payer: Medicare Other | Attending: Internal Medicine

## 2022-04-24 DIAGNOSIS — I10 Essential (primary) hypertension: Secondary | ICD-10-CM

## 2022-04-24 DIAGNOSIS — I5032 Chronic diastolic (congestive) heart failure: Secondary | ICD-10-CM

## 2022-04-25 ENCOUNTER — Ambulatory Visit: Payer: Medicare Other | Admitting: Podiatry

## 2022-04-25 DIAGNOSIS — B351 Tinea unguium: Secondary | ICD-10-CM | POA: Diagnosis not present

## 2022-04-25 DIAGNOSIS — M79675 Pain in left toe(s): Secondary | ICD-10-CM

## 2022-04-25 DIAGNOSIS — M79674 Pain in right toe(s): Secondary | ICD-10-CM

## 2022-04-25 LAB — BASIC METABOLIC PANEL
BUN/Creatinine Ratio: 17 (ref 12–28)
BUN: 16 mg/dL (ref 8–27)
CO2: 33 mmol/L — ABNORMAL HIGH (ref 20–29)
Calcium: 9.5 mg/dL (ref 8.7–10.3)
Chloride: 97 mmol/L (ref 96–106)
Creatinine, Ser: 0.95 mg/dL (ref 0.57–1.00)
Glucose: 103 mg/dL — ABNORMAL HIGH (ref 70–99)
Potassium: 5.2 mmol/L (ref 3.5–5.2)
Sodium: 140 mmol/L (ref 134–144)
eGFR: 58 mL/min/{1.73_m2} — ABNORMAL LOW (ref >59)

## 2022-04-25 LAB — PRO B NATRIURETIC PEPTIDE: NT-Pro BNP: 235 pg/mL (ref 0–738)

## 2022-04-29 ENCOUNTER — Encounter: Payer: Self-pay | Admitting: Podiatry

## 2022-04-29 NOTE — Progress Notes (Signed)
  Subjective:  Patient ID: [name], female    DOB: 1933/06/26,  MRN: [id number]  MA MUNOZ presents to clinic today for painful thick toenails that are difficult to trim. Pain interferes with ambulation. Aggravating factors include wearing enclosed shoe gear. Pain is relieved with periodic professional debridement.  She states she was hospitalized last month for respiratory issues.   PCP is Harlan Stains, MD , and last visit was August, 2023.  Allergies  Allergen Reactions   Atorvastatin Other (See Comments)    Myalgias   Ceftin [Cefuroxime] Diarrhea   Cefuroxime Axetil Diarrhea   Clindamycin Hcl Itching   Clindamycin/Lincomycin Itching   Moxifloxacin Itching   Simvastatin Other (See Comments)    myalgias   Doxycycline Hyclate Diarrhea and Rash   Penicillins Rash    Review of Systems: Negative except as noted in the HPI.  Objective: No changes noted in today's physical examination. SEVANNAH MADIA is a pleasant 88 y.o. female in NAD. AAO x 3.  Vascular Examination: Palpable pedal pulses b/l LE. Digital hair present b/l. Skin temperature gradient WNL b/l. No varicosities b/l. Trace edema noted BLE.  Dermatological Examination: Pedal skin with normal turgor, texture and tone b/l. No open wounds. No interdigital macerations b/l. Toenails 1-5 b/l thickened, discolored, dystrophic with subungual debris. There is pain on palpation to dorsal aspect of nailplates. No hyperkeratotic nor porokeratotic lesions present on today's visit.Marland Kitchen  Neurological Examination: Protective sensation intact with 10 gram monofilament b/l LE. Vibratory sensation intact b/l LE.   Musculoskeletal Examination: Muscle strength 5/5 to all LE muscle groups b/l. HAV with bunion bilaterally and hammertoes 2-5 b/l. Utilizes motorized chair for mobility assistance.     Latest Ref Rng & Units 03/20/2022    8:44 AM  Hemoglobin A1C  Hemoglobin-A1c 4.8 - 5.6 % 5.6    Assessment/Plan: 1. Pain due to  onychomycosis of toenails of both feet   -Examined patient. -No new findings. No new orders. -Toenails 1-5 b/l were debrided in length and girth with sterile nail nippers and dremel without iatrogenic bleeding.  -Patient/POA to call should there be question/concern in the interim.   Return in about 3 months (around 07/25/2022).  Marzetta Board, DPM

## 2022-04-30 ENCOUNTER — Telehealth: Payer: Self-pay | Admitting: Pulmonary Disease

## 2022-05-02 NOTE — Telephone Encounter (Signed)
Order for oxygen was placed on 04/20/2022. Advised patient to call [employer] to figure out the oxygen order on there end. Nothing further needed

## 2022-05-03 ENCOUNTER — Telehealth: Payer: Self-pay | Admitting: Interventional Cardiology

## 2022-05-03 NOTE — Telephone Encounter (Signed)
Pt c/o medication issue:  1. Name of Medication: dapagliflozin propanediol (FARXIGA) 10 MG TABS tablet  2. How are you currently taking this medication (dosage and times per day)? Take 1 tablet (10 mg total) by mouth daily before breakfast.  3. Are you having a reaction (difficulty breathing--STAT)? no  4. What is your medication issue? Patient states she has run out of samples, she now has to fill her script.  She wants to know if the generic form of Wilder Glade can be prescribed to her, as it will be less expensive than the name brand.  She said the generic was carvedilol.  Please advise.

## 2022-05-03 NOTE — Telephone Encounter (Signed)
Called pt advised the generic form of Wilder Glade is dapagliflozin propanediol.  Pt thought generic was carvedilol advised pt different class of medication.   Medication ordered as dapagliflozin pt hoping this will cut down on cost. No further questions or concerns.  Advised to call back if needed.

## 2022-05-10 ENCOUNTER — Telehealth: Payer: Self-pay | Admitting: Pulmonary Disease

## 2022-05-10 DIAGNOSIS — G4734 Idiopathic sleep related nonobstructive alveolar hypoventilation: Secondary | ICD-10-CM

## 2022-05-10 NOTE — Progress Notes (Signed)
Cardiology Office Note:    Date:  05/11/2022   ID:  [name], DOB [birth year], MRN [id number]  PCP:  Shirline Frees, [facility] Cardiologist:  Sinclair Grooms, MD    Referring MD: Harlan Stains, MD   Chief Complaint:  F/u for CHF    Patient Profile: (HFpEF) heart failure with preserved ejection fraction  Echo 8/23: EF 60-65, RVSP 42.4 Chronic Obstructive Pulmonary Disease Mild pulmonary hypertension Hypertension  Obesity  Hyperlipidemia  Hx of CVA  Carotid stenosis Carotid US 7/23: Bilateral ICA 40-59  Prior CV Studies: ECHO COMPLETE WITH IMAGING ENHANCING AGENT 03/21/2022 EF 60-65, no RWMA, normal RVSF, mildly elevated PASP (RVSP 42.4), RA pressure 3   VAS US CAROTID DUPLEX BILATERAL 02/15/2022 Right Carotid: Velocities in the right ICA are consistent with a 40-59% stenosis. The ECA appears >50% stenosed. Left Carotid: Velocities in the left ICA are consistent with a 40-59% stenosis. Hemodynamically significant plaque >50% visualized in the CCA. The ECA appears >50% stenosed.     History of Present Illness:   [name] is a 88 y.o. female with the above problem list.  She was admitted in Aug 2023 with hypoxic respiratory failure which required BiPap. Her BNP was mildly elevated. Echocardiogram showed normal EF. She was continued on Lasix 20 mg once daily. She had OP f/u with Dr. Tamala Julian 04/09/22. He started her on Farxiga for (HFpEF) heart failure with preserved ejection fraction. She returns for f/u.  She is here alone.  She she is down 10 pounds total since early August (down 3 pounds since seeing Dr. Tamala Julian a few weeks ago).  Since starting on Farxiga, she feels much better.  Her breathing is improved.  Her edema is improved.  She is describing orthopnea which has resolved since starting on Farxiga.  She has not had chest pain, syncope.    Past Medical History:  Diagnosis Date   Acute back pain with sciatica, unspecified laterality     Allergic rhinitis    BMI 40.0-44.9, adult (HCC)    Carotid artery disease ([place]) 05/11/2022   Carotid US 02/2022: Bilateral ICA 40-59   Cerebrovascular disease    Chronic diastolic (congestive) heart failure (HCC)    Colon polyp    COPD (chronic obstructive pulmonary disease) (HCC)    MILD   Disorder of bone    Estrogen deficiency    Excessive sleepiness    Hyperlipidemia    Hypertension    Incontinence of urine    Irregular heartbeat    Low back pain    Morbid obesity (HCC)    OA (osteoarthritis)    OF THE KNEES   OAB (overactive bladder)    Osteopenia    Osteopenia    Primary osteoarthritis of both knees    Pure hypercholesterolemia    Shingles    Shingles    Stroke [facility])    Thyroid cyst    Thyroid cyst    Ulnar neuropathy of both upper extremities    Vertigo    Current Medications: Current Meds  Medication Sig   albuterol (PROVENTIL) (2.5 MG/3ML) 0.083% nebulizer solution Take 3 mLs (2.5 mg total) by nebulization every 4 (four) hours as needed for wheezing or shortness of breath.   amLODipine (NORVASC) 2.5 MG tablet Take 2.5 mg by mouth daily.   aspirin EC 81 MG EC tablet Take 1 tablet (81 mg total) by mouth daily. Swallow whole.   budesonide (PULMICORT) 0.25 MG/2ML nebulizer solution Take 2 mLs (  0.25 mg total) by nebulization in the morning and at bedtime.   Cholecalciferol (VITAMIN D PO) Take 1,000 Units by mouth daily.   dapagliflozin propanediol (FARXIGA) 10 MG TABS tablet Take 1 tablet (10 mg total) by mouth daily before breakfast.   docusate sodium (COLACE) 100 MG capsule Take 100 mg by mouth daily.   furosemide (LASIX) 20 MG tablet Take 1 tablet (20 mg total) by mouth daily.   ipratropium-albuterol (DUONEB) 0.5-2.5 (3) MG/3ML SOLN Take 3 mLs by nebulization 2 (two) times daily.   Metoprolol Succinate 25 MG CS24 Take 25 mg by mouth daily.   OXYGEN Inhale into the lungs as directed. 2L   rosuvastatin (CRESTOR) 20 MG tablet Take 20 mg by mouth daily.     Allergies:   Atorvastatin, Ceftin [cefuroxime], Cefuroxime axetil, Clindamycin hcl, Clindamycin/lincomycin, Moxifloxacin, Simvastatin, Doxycycline hyclate, and Penicillins   Social History   Tobacco Use   Smoking status: Former   Smokeless tobacco: Never  Scientific laboratory technician Use: Never used  Substance Use Topics   Alcohol use: Not Currently   Drug use: Never    Family Hx: The patient's family history includes CAD in her father. There is no history of Breast cancer.  Review of Systems  Gastrointestinal:  Negative for hematochezia.  Genitourinary:  Negative for hematuria.  Neurological:  Positive for loss of balance.     EKGs/Labs/Other Test Reviewed:    EKG:  EKG is not ordered today.  The ekg ordered today demonstrates n/a  Recent Labs: 03/19/2022: B Natriuretic Peptide 376.4 03/20/2022: Magnesium 2.0 03/21/2022: ALT 16 03/22/2022: Hemoglobin 14.3; Platelets 178 04/24/2022: BUN 16; Creatinine, Ser 0.95; NT-Pro BNP 235; Potassium 5.2; Sodium 140   Recent Lipid Panel Recent Labs    08/31/21 1007  CHOL 165  TRIG 105  HDL 53  LDLCALC 93     Risk Assessment/Calculations/Metrics:         HYPERTENSION CONTROL Vitals:   05/11/22 0958 05/11/22 1224  BP: (!) 170/90 (!) 170/80    The patient's blood pressure is elevated above target today.  In order to address the patient's elevated BP: Blood pressure will be monitored at home to determine if medication changes need to be made.       Physical Exam:    VS:  BP (!) 170/80   Pulse 80   Ht '4\' 11"'$  (1.499 m)   Wt 199 lb 3.2 oz (90.4 kg)   SpO2 92%   BMI 40.23 kg/m     Wt Readings from Last 3 Encounters:  05/11/22 199 lb 3.2 oz (90.4 kg)  04/20/22 202 lb (91.6 kg)  04/09/22 202 lb (91.6 kg)    Constitutional:      Appearance: Healthy appearance. Not in distress.  Neck:     Vascular: No JVR.  Pulmonary:     Effort: Pulmonary effort is normal.     Breath sounds: No wheezing. No rales.  Cardiovascular:     Normal  rate. Regular rhythm. Normal S1. Normal S2.      Murmurs: There is no murmur.  Edema:    Peripheral edema (tight) present.    Pretibial: bilateral 1+ edema of the pretibial area. Abdominal:     Palpations: Abdomen is soft.  Musculoskeletal:     Cervical back: Neck supple. Skin:    General: Skin is warm and dry.  Neurological:     Mental Status: Alert and oriented to person, place and time.         ASSESSMENT &  PLAN:   (HFpEF) heart failure with preserved ejection fraction ([place]) Echo 8/23: EF 60-65.  She is NYHA IIb-III.  Volume status stable.  She still does have lower extremity edema but prefers to not add any more medications or increase her furosemide.  Her blood pressure is uncontrolled.  She might benefit from [place].  She does note that her blood pressures at home are usually optimal.  I will have her monitor her blood pressure over the next couple of weeks and send in some readings for review.  Her recent lab work demonstrates high normal potassium.  She does eat a lot of bananas.  I would recommend avoiding spironolactone.  If her blood pressure does remain high, we could place her on Entresto.  To simplify her medications, I would suggest stopping the amlodipine if Delene Loll is started.  Continue Farxiga 10 mg daily, furosemide 20 mg daily.  I will have her follow-up in the next 3 months with either Dr. Tamala Julian or me.  COPD (chronic obstructive pulmonary disease) ([place]) She is not really wearing oxygen.  I have encouraged her to do so.  I have also encouraged her to follow-up with pulmonology as planned.  Essential hypertension Blood pressure is uncontrolled.  She notes blood pressures at home are typically optimal (120s/80s).  I have asked her to monitor blood pressure at home and send readings for review.  As noted, consider adding Entresto for CHF management as well as blood pressure control.  For now, can continue Norvasc 2.5 mg daily, metoprolol succinate 25 mg daily.  Carotid  artery disease ([place]) She is on aspirin and rosuvastatin.  Currently, neurology is following her carotid stenosis with carotid ultrasound.           Dispo:  Return in about 3 months (around 08/10/2022) for Routine Follow Up w/ Dr. Tamala Julian, or Richardson Dopp, PA-C.   Medication Adjustments/Labs and Tests Ordered: Current medicines are reviewed at length with the patient today.  Concerns regarding medicines are outlined above.  Tests Ordered: No orders of the defined types were placed in this encounter.  Medication Changes: No orders of the defined types were placed in this encounter.  Signed, Richardson Dopp, PA-C  05/11/2022 12:35 PM    [address], [place], [place]  [address] Phone: [phone]; Fax: [phone]

## 2022-05-10 NOTE — Progress Notes (Incomplete)
Cardiology Office Note:    Date:  05/10/2022   ID:  [name], DOB Dec 17, 1932, MRN [id number]  PCP:  Harlan Stains, MD  [place] Providers Cardiologist:  Sinclair Grooms, MD { Click to update primary MD,subspecialty MD or APP then REFRESH:1}  *** Referring MD: Harlan Stains, MD   Chief Complaint:  No chief complaint on file. {Click here for Visit Info    :1}   Patient Profile: (HFpEF) heart failure with preserved ejection fraction  Chronic Obstructive Pulmonary Disease Hypertension  Obesity  Hyperlipidemia  Hx of CVA   Prior CV Studies: ECHO COMPLETE WITH IMAGING ENHANCING AGENT 03/21/2022 IMPRESSIONS 1. Left ventricular ejection fraction, by estimation, is 60 to 65%. The left ventricle has normal function. The left ventricle has no regional wall motion abnormalities. Left ventricular diastolic function could not be evaluated. 2. Right ventricular systolic function is normal. The right ventricular size is normal. There is mildly elevated pulmonary artery systolic pressure. 3. The mitral valve is normal in structure. No evidence of mitral valve regurgitation. No evidence of mitral stenosis. Moderate mitral annular calcification. 4. The aortic valve is normal in structure. Aortic valve regurgitation is not visualized. No aortic stenosis is present. 5. The inferior vena cava is normal in size with greater than 50% respiratory variability, suggesting right atrial pressure of 3 mmHg. Right ventricular systolic function is normal. There is mildly elevated pulmonary artery systolic pressure. The tricuspid regurgitant velocity is 3.14 m/s, and with an assumed right atrial pressure of 3 mmHg, the estimated right ventricular systolic pressure is 05.3 mmHg.   VAS US CAROTID DUPLEX BILATERAL 02/15/2022 Right Carotid: Velocities in the right ICA are consistent with a 40-59% stenosis. The ECA appears >50% stenosed. Left Carotid: Velocities in the left ICA are consistent  with a 40-59% stenosis. Hemodynamically significant plaque >50% visualized in the CCA. The ECA appears >50% stenosed.    ***  History of Present Illness:   [name] is a 88 y.o. female with the above problem list.  She was admitted in Aug 2023 with hypoxic respiratory failure. Her BNP was mildly elevated. Echocardiogram showed normal EF. She was continued on Lasix 20 mg once daily. She had OP f/u with Dr. Tamala Julian 04/09/22. He started her on Farxiga for (HFpEF) heart failure with preserved ejection fraction.         Past Medical History:  Diagnosis Date  . Acute back pain with sciatica, unspecified laterality   . Allergic rhinitis   . BMI 40.0-44.9, adult ([place])   . Cerebrovascular disease   . Chronic diastolic (congestive) heart failure ([place])   . Colon polyp   . COPD (chronic obstructive pulmonary disease) (HCC)    MILD  . Disorder of bone   . Estrogen deficiency   . Excessive sleepiness   . Hyperlipidemia   . Hypertension   . Incontinence of urine   . Irregular heartbeat   . Low back pain   . Morbid obesity ([place])   . OA (osteoarthritis)    OF THE KNEES  . OAB (overactive bladder)   . Osteopenia   . Osteopenia   . Primary osteoarthritis of both knees   . Pure hypercholesterolemia   . Shingles   . Shingles   . Stroke ([place])   . Thyroid cyst   . Thyroid cyst   . Ulnar neuropathy of both upper extremities   . Vertigo    Current Medications: No outpatient medications have been marked as taking for the  05/11/22 encounter (Appointment) with Liliane Shi, PA-C.    Allergies:   Atorvastatin, Ceftin [cefuroxime], Cefuroxime axetil, Clindamycin hcl, Clindamycin/lincomycin, Moxifloxacin, Simvastatin, Doxycycline hyclate, and Penicillins   Social History   Tobacco Use  . Smoking status: Former  . Smokeless tobacco: Never  Vaping Use  . Vaping Use: Never used  Substance Use Topics  . Alcohol use: Not Currently  . Drug use: Never    Family Hx: The patient's family  history includes CAD in her father. There is no history of Breast cancer.  ROS   EKGs/Labs/Other Test Reviewed:    EKG:  EKG is *** ordered today.  The ekg ordered today demonstrates ***  Recent Labs: 03/19/2022: B Natriuretic Peptide 376.4 03/20/2022: Magnesium 2.0 03/21/2022: ALT 16 03/22/2022: Hemoglobin 14.3; Platelets 178 04/24/2022: BUN 16; Creatinine, Ser 0.95; NT-Pro BNP 235; Potassium 5.2; Sodium 140   Recent Lipid Panel Recent Labs    08/31/21 1007  CHOL 165  TRIG 105  HDL 53  LDLCALC 93     Risk Assessment/Calculations/Metrics:   {Does this patient have ATRIAL FIBRILLATION?:[phone]}     No BP recorded.  {Refresh Note OR Click here to enter BP  :1}***    Physical Exam:    VS:  There were no vitals taken for this visit.    Wt Readings from Last 3 Encounters:  04/20/22 202 lb (91.6 kg)  04/09/22 202 lb (91.6 kg)  03/21/22 209 lb 14.1 oz (95.2 kg)    Physical Exam ***     ASSESSMENT & PLAN:   No problem-specific Assessment & Plan notes found for this encounter.        {Are you ordering a CV Procedure (e.g. stress test, cath, DCCV, TEE, etc)?   Press F2        :[id number]}   Dispo:  No follow-ups on file.   Medication Adjustments/Labs and Tests Ordered: Current medicines are reviewed at length with the patient today.  Concerns regarding medicines are outlined above.  Tests Ordered: No orders of the defined types were placed in this encounter.  Medication Changes: No orders of the defined types were placed in this encounter.  Signed, Richardson Dopp, PA-C  05/10/2022 11:44 PM    [address], [place], [place]  [address] Phone: [phone]; Fax: [phone]

## 2022-05-10 NOTE — Telephone Encounter (Signed)
ONO Results  Please let patient know she spent 51 min with SpO2 less than 88%. She should use 2L O2 at night when sleeping.  Thanks, JD

## 2022-05-11 ENCOUNTER — Ambulatory Visit: Payer: Medicare Other | Attending: Physician Assistant | Admitting: Physician Assistant

## 2022-05-11 ENCOUNTER — Encounter: Payer: Self-pay | Admitting: Physician Assistant

## 2022-05-11 VITALS — BP 170/80 | HR 80 | Ht 59.0 in | Wt 199.2 lb

## 2022-05-11 DIAGNOSIS — I6523 Occlusion and stenosis of bilateral carotid arteries: Secondary | ICD-10-CM

## 2022-05-11 DIAGNOSIS — J42 Unspecified chronic bronchitis: Secondary | ICD-10-CM

## 2022-05-11 DIAGNOSIS — I10 Essential (primary) hypertension: Secondary | ICD-10-CM | POA: Diagnosis not present

## 2022-05-11 DIAGNOSIS — I5032 Chronic diastolic (congestive) heart failure: Secondary | ICD-10-CM | POA: Diagnosis not present

## 2022-05-11 DIAGNOSIS — I779 Disorder of arteries and arterioles, unspecified: Secondary | ICD-10-CM

## 2022-05-11 HISTORY — DX: Disorder of arteries and arterioles, unspecified: I77.9

## 2022-05-11 NOTE — Assessment & Plan Note (Addendum)
Echo 8/23: EF 60-65.  She is NYHA IIb-III.  Volume status stable.  She still does have lower extremity edema but prefers to not add any more medications or increase her furosemide.  Her blood pressure is uncontrolled.  She might benefit from [place].  She does note that her blood pressures at home are usually optimal.  I will have her monitor her blood pressure over the next couple of weeks and send in some readings for review.  Her recent lab work demonstrates high normal potassium.  She does eat a lot of bananas.  I would recommend avoiding spironolactone.  If her blood pressure does remain high, we could place her on Entresto.  To simplify her medications, I would suggest stopping the amlodipine if Delene Loll is started.  Continue Farxiga 10 mg daily, furosemide 20 mg daily.  I will have her follow-up in the next 3 months with either Dr. Tamala Julian or me.

## 2022-05-11 NOTE — Assessment & Plan Note (Signed)
Blood pressure is uncontrolled.  She notes blood pressures at home are typically optimal (120s/80s).  I have asked her to monitor blood pressure at home and send readings for review.  As noted, consider adding Entresto for CHF management as well as blood pressure control.  For now, can continue Norvasc 2.5 mg daily, metoprolol succinate 25 mg daily.

## 2022-05-11 NOTE — Patient Instructions (Signed)
Medication Instructions:  Your physician recommends that you continue on your current medications as directed. Please refer to the Current Medication list given to you today.  *If you need a refill on your cardiac medications before your next appointment, please call your pharmacy*   Lab Work: None ordered  If you have labs (blood work) drawn today and your tests are completely normal, you will receive your results only by: [place] (if you have MyChart) OR A paper copy in the mail If you have any lab test that is abnormal or we need to change your treatment, we will call you to review the results.   Testing/Procedures: None ordered   Follow-Up: At [facility], you and your health needs are our priority.  As part of our continuing mission to provide you with exceptional heart care, we have created designated Provider Care Teams.  These Care Teams include your primary Cardiologist (physician) and Advanced Practice Providers (APPs -  Physician Assistants and Nurse Practitioners) who all work together to provide you with the care you need, when you need it.  We recommend signing up for the patient portal called "MyChart".  Sign up information is provided on this After Visit Summary.  MyChart is used to connect with patients for Virtual Visits (Telemedicine).  Patients are able to view lab/test results, encounter notes, upcoming appointments, etc.  Non-urgent messages can be sent to your provider as well.   To learn more about what you can do with MyChart, go to [email or web address].    Your next appointment:   3 month(s)  The format for your next appointment:   In Person  Provider:   Sinclair Grooms, MD     Other Instructions Your physician has requested that you regularly monitor and record your blood pressure readings at home. Please use the same machine at the same time of day to check your readings and record them to bring to your follow-up visit.   Please  monitor blood pressures and keep a log of your readings in 1 week and send [place] a mychart message or call us with those.     Make sure to check 2 hours after your medications.    AVOID these things for 30 minutes before checking your blood pressure: No Drinking caffeine. No Drinking alcohol. No Eating. No Smoking. No Exercising.   Five minutes before checking your blood pressure: Pee. Sit in a dining chair. Avoid sitting in a soft couch or armchair. Be quiet. Do not talk   Important Information About Sugar

## 2022-05-11 NOTE — Assessment & Plan Note (Signed)
She is not really wearing oxygen.  I have encouraged her to do so.  I have also encouraged her to follow-up with pulmonology as planned.

## 2022-05-11 NOTE — Assessment & Plan Note (Signed)
She is on aspirin and rosuvastatin.  Currently, neurology is following her carotid stenosis with carotid ultrasound.

## 2022-05-14 NOTE — Telephone Encounter (Signed)
Called and spoke with patient. She verbalized understanding. Updated order has been placed for O2.   Nothing further needed at time of call.

## 2022-05-22 ENCOUNTER — Telehealth: Payer: Self-pay | Admitting: Pulmonary Disease

## 2022-05-23 NOTE — Telephone Encounter (Signed)
Called and spoke to patient about her oxygen and advised her that I wold send Melissa from [employer] a message and see what can be done in regards to poc. Patient verbalized understanding.   Community message sent to [place] about poc. Nothing further needed

## 2022-05-25 ENCOUNTER — Telehealth: Payer: Self-pay | Admitting: Pulmonary Disease

## 2022-05-25 DIAGNOSIS — J432 Centrilobular emphysema: Secondary | ICD-10-CM

## 2022-05-25 NOTE — Telephone Encounter (Signed)
Community message has been sent to [employer] to check status of pt receiving this. Called and spoke with pt letting her know this had been done and stated to her once we had an update that we would let her know what we found out and she verbalized understanding. Will keep encounter open.

## 2022-05-28 ENCOUNTER — Other Ambulatory Visit: Payer: Self-pay

## 2022-05-28 MED ORDER — DAPAGLIFLOZIN PROPANEDIOL 10 MG PO TABS: 10.0000 mg | ORAL_TABLET | Freq: Every day | ORAL | 3 refills | Status: DC

## 2022-05-28 NOTE — Telephone Encounter (Signed)
Order placed for pt to receive a POC. Called and spoke with pt letting her know what I found out from [employer] and that we placed a new order and she verbalized understanding. Nothing further needed.

## 2022-05-28 NOTE — Telephone Encounter (Signed)
Community message received:  Laurann Montana; Taylor Levick, Waldemar Dickens, CMA; Minus Liberty; Nash Shearer; [place], Leory Plowman Is this order for a POC or nocturnal oxygen?    When looked at the prior order that the nurse placed on 9/8 after pt's OV, nothing was specified whether that order needed to be a POC, at night, or continuous as that was left blank.  This message was from pt's OV with Dr. Erin Fulling 9/8:  She desaturated to 84% on simple walk today. We will order her a portable oxygen concentrator at 2-3L pulsed.     Order should have been for a POC.  Beth, with Dr. Erin Fulling out of the office, please advise if you will be okay with [place] placing order under you for pt to receive POC?  Please route back to triage pool.

## 2022-05-28 NOTE — Telephone Encounter (Signed)
Yes that is fine, thanks!

## 2022-06-04 ENCOUNTER — Telehealth: Payer: Self-pay | Admitting: Pulmonary Disease

## 2022-06-04 NOTE — Telephone Encounter (Signed)
Called patient and told her that we are just waiting for [employer] to receive the order. Patient verbalized understanding. Nothing further needed

## 2022-07-09 ENCOUNTER — Other Ambulatory Visit: Payer: Self-pay | Admitting: Family Medicine

## 2022-07-09 DIAGNOSIS — Z1231 Encounter for screening mammogram for malignant neoplasm of breast: Secondary | ICD-10-CM

## 2022-07-26 ENCOUNTER — Ambulatory Visit
Admission: RE | Admit: 2022-07-26 | Discharge: 2022-07-26 | Disposition: A | Discharge status: Home / Self Care | Payer: Medicare Other | Source: Ambulatory Visit | Attending: Family Medicine | Admitting: Family Medicine

## 2022-07-26 DIAGNOSIS — Z1231 Encounter for screening mammogram for malignant neoplasm of breast: Secondary | ICD-10-CM

## 2022-07-29 NOTE — Progress Notes (Signed)
Cardiology Office Note:    Date:  07/31/2022   ID:  [name], DOB 09/01/1932, MRN [id number]  PCP:  Shirline Frees, MD  Cardiologist:  Sinclair Grooms, MD   Referring MD: Shirline Frees, MD   Chief Complaint  Patient presents with   Hypertension   Shortness of Breath    History of Present Illness:    [name] is a 88 y.o. female with a hx of COPD, primary hypertension, obesity, and chronic diastolic HF.  Today for clinical follow-up.  She was admitted with diastolic heart failure earlier this year.  SGLT2 therapy was started.  States her breathing is doing very well now.  She should be on oxygen continuously but she tells me her breathing is so good that she has stopped it.  She has not upcoming pulmonology appointment.  Past Medical History:  Diagnosis Date   Acute back pain with sciatica, unspecified laterality    Allergic rhinitis    BMI 40.0-44.9, adult (HCC)    Carotid artery disease ([place]) 05/11/2022   Carotid US 02/2022: Bilateral ICA 40-59   Cerebrovascular disease    Chronic diastolic (congestive) heart failure (HCC)    Colon polyp    COPD (chronic obstructive pulmonary disease) (HCC)    MILD   Disorder of bone    Estrogen deficiency    Excessive sleepiness    Hyperlipidemia    Hypertension    Incontinence of urine    Irregular heartbeat    Low back pain    Morbid obesity (HCC)    OA (osteoarthritis)    OF THE KNEES   OAB (overactive bladder)    Osteopenia    Osteopenia    Primary osteoarthritis of both knees    Pure hypercholesterolemia    Shingles    Shingles    Stroke ([place])    Thyroid cyst    Thyroid cyst    Ulnar neuropathy of both upper extremities    Vertigo     Past Surgical History:  Procedure Laterality Date   BREAST EXCISIONAL BIOPSY Right    benign    Current Medications: Current Meds  Medication Sig   albuterol (PROVENTIL) (2.5 MG/3ML) 0.083% nebulizer solution Take 3 mLs (2.5 mg total) by nebulization every 4  (four) hours as needed for wheezing or shortness of breath.   amLODipine (NORVASC) 2.5 MG tablet Take 2.5 mg by mouth daily.   aspirin EC 81 MG EC tablet Take 1 tablet (81 mg total) by mouth daily. Swallow whole.   budesonide (PULMICORT) 0.25 MG/2ML nebulizer solution Take 2 mLs (0.25 mg total) by nebulization in the morning and at bedtime.   Cholecalciferol (VITAMIN D PO) Take 1,000 Units by mouth daily.   dapagliflozin propanediol (FARXIGA) 10 MG TABS tablet Take 1 tablet (10 mg total) by mouth daily before breakfast.   docusate sodium (COLACE) 100 MG capsule Take 100 mg by mouth daily.   furosemide (LASIX) 20 MG tablet Take 1 tablet (20 mg total) by mouth daily.   ipratropium-albuterol (DUONEB) 0.5-2.5 (3) MG/3ML SOLN Take 3 mLs by nebulization 2 (two) times daily.   Metoprolol Succinate 25 MG CS24 Take 25 mg by mouth daily.   OXYGEN Inhale into the lungs as directed. 2L   pravastatin (PRAVACHOL) 10 MG tablet Take 10 mg by mouth daily.     Allergies:   Atorvastatin, Ceftin [cefuroxime], Cefuroxime axetil, Clindamycin hcl, Clindamycin/lincomycin, Moxifloxacin, Simvastatin, Doxycycline hyclate, and Penicillins   Social History   Socioeconomic History  Marital status: Widowed    Spouse name: Not on file   Number of children: Not on file   Years of education: Not on file   Highest education level: Not on file  Occupational History   Not on file  Tobacco Use   Smoking status: Former   Smokeless tobacco: Never  Vaping Use   Vaping Use: Never used  Substance and Sexual Activity   Alcohol use: Not Currently   Drug use: Never   Sexual activity: Not on file  Other Topics Concern   Not on file  Social History Narrative   Lives alone   Right Handed   Drinks decaf   Social Determinants of Health   Financial Resource Strain: Not on file  Food Insecurity: Not on file  Transportation Needs: Not on file  Physical Activity: Not on file  Stress: Not on file  Social Connections: Not  on file     Family History: The patient's family history includes CAD in her father. There is no history of Breast cancer.  ROS:   Please see the history of present illness.    Not repeated all other systems reviewed and are negative.  EKGs/Labs/Other Studies Reviewed:    The following studies were reviewed today:  03/2022 ECHOCARDIOGRAM: IMPRESSIONS   1. Left ventricular ejection fraction, by estimation, is 60 to 65%. The  left ventricle has normal function. The left ventricle has no regional  wall motion abnormalities. Left ventricular diastolic function could not  be evaluated.   2. Right ventricular systolic function is normal. The right ventricular  size is normal. There is mildly elevated pulmonary artery systolic  pressure.   3. The mitral valve is normal in structure. No evidence of mitral valve  regurgitation. No evidence of mitral stenosis. Moderate mitral annular  calcification.   4. The aortic valve is normal in structure. Aortic valve regurgitation is  not visualized. No aortic stenosis is present.   5. The inferior vena cava is normal in size with greater than 50%  respiratory variability, suggesting right atrial pressure of 3 mmHg.    Suggestion of PAD by ABI September 2023: MPRESSION: Resting ABI the bilateral lower extremity in the mild range arterial occlusive disease.   Segmental exam demonstrates no significant femoropopliteal disease bilaterally.   Early right-sided tibial arterial disease with monophasic anterior tibial artery. Waveforms relatively maintained on the left.   EKG:  EKG when performed in August reveal decreased voltage and left atrial abnormality.  Recent Labs: 03/19/2022: B Natriuretic Peptide 376.4 03/20/2022: Magnesium 2.0 03/21/2022: ALT 16 03/22/2022: Hemoglobin 14.3; Platelets 178 04/24/2022: BUN 16; Creatinine, Ser 0.95; NT-Pro BNP 235; Potassium 5.2; Sodium 140  Recent Lipid Panel    Component Value Date/Time   CHOL 165  08/31/2021 1007   TRIG 105 08/31/2021 1007   HDL 53 08/31/2021 1007   CHOLHDL 3.1 08/31/2021 1007   CHOLHDL 3.6 10/24/2020 0135   VLDL 17 10/24/2020 0135   LDLCALC 93 08/31/2021 1007    Physical Exam:    VS:  BP (!) 140/74   Pulse (!) 102   Ht '4\' 11"'$  (1.499 m)   Wt 200 lb 6.4 oz (90.9 kg)   SpO2 95%   BMI 40.48 kg/m     Wt Readings from Last 3 Encounters:  07/31/22 200 lb 6.4 oz (90.9 kg)  05/11/22 199 lb 3.2 oz (90.4 kg)  04/20/22 202 lb (91.6 kg)     GEN: In wheelchair.  No complaints.. No acute distress HEENT: Normal  NECK: No JVD. LYMPHATICS: No lymphadenopathy CARDIAC: No murmur. RRR no gallop, or edema. VASCULAR:  Normal Pulses. No bruits. RESPIRATORY:  Clear to auscultation without rales, wheezing or rhonchi  ABDOMEN: Soft, non-tender, non-distended, No pulsatile mass, MUSCULOSKELETAL: No deformity  SKIN: Warm and dry NEUROLOGIC:  Alert and oriented x 3 PSYCHIATRIC:  Normal affect   ASSESSMENT:    1. Chronic heart failure with preserved ejection fraction ([place])   2. Essential hypertension   3. Bilateral carotid artery stenosis   4. Chronic bronchitis, unspecified chronic bronchitis type ([place])    PLAN:    In order of problems listed above:  Her breathing is improved since starting SGLT2 therapy..  Would continue at current dose of 10 mg/day. Blood pressure is better since adding dapagliflozin.   Did not discuss She would be on chronic O2 therapy.  She has stopped wearing it because her breathing is so much better.  She is to discuss the status of chronic O2 therapy with her pulmonologist at the appointment that will be coming up shortly.  She needs a 6 to 70-monthfollow-up for diastolic heart failure with a new provider.   Medication Adjustments/Labs and Tests Ordered: Current medicines are reviewed at length with the patient today.  Concerns regarding medicines are outlined above.  No orders of the defined types were placed in this encounter.  No  orders of the defined types were placed in this encounter.   Patient Instructions  Medication Instructions:  Your physician recommends that you continue on your current medications as directed. Please refer to the Current Medication list given to you today.  *If you need a refill on your cardiac medications before your next appointment, please call your pharmacy*  Follow-Up: At [facility] you and your health needs are our priority.  As part of our continuing mission to provide you with exceptional heart care, we have created designated Provider Care Teams.  These Care Teams include your primary Cardiologist (physician) and Advanced Practice Providers (APPs -  Physician Assistants and Nurse Practitioners) who all work together to provide you with the care you need, when you need it.  Your next appointment:   6 month(s)  The format for your next appointment:   In Person  Provider:   MCandee Furbish MD or MRudean Haskell MD or HGwyndolyn Kaufman MD  Important Information About Sugar         Signed, HSinclair Grooms MD  07/31/2022 11:12 AM    [place]

## 2022-07-31 ENCOUNTER — Ambulatory Visit (INDEPENDENT_AMBULATORY_CARE_PROVIDER_SITE_OTHER): Payer: Medicare Other | Admitting: Podiatry

## 2022-07-31 ENCOUNTER — Encounter: Payer: Self-pay | Admitting: Podiatry

## 2022-07-31 ENCOUNTER — Ambulatory Visit: Payer: Medicare Other | Attending: Interventional Cardiology | Admitting: Interventional Cardiology

## 2022-07-31 ENCOUNTER — Encounter: Payer: Self-pay | Admitting: Interventional Cardiology

## 2022-07-31 VITALS — BP 181/80

## 2022-07-31 VITALS — BP 140/74 | HR 102 | Ht 59.0 in | Wt 200.4 lb

## 2022-07-31 DIAGNOSIS — I6523 Occlusion and stenosis of bilateral carotid arteries: Secondary | ICD-10-CM

## 2022-07-31 DIAGNOSIS — M79674 Pain in right toe(s): Secondary | ICD-10-CM

## 2022-07-31 DIAGNOSIS — M79675 Pain in left toe(s): Secondary | ICD-10-CM

## 2022-07-31 DIAGNOSIS — J42 Unspecified chronic bronchitis: Secondary | ICD-10-CM

## 2022-07-31 DIAGNOSIS — I10 Essential (primary) hypertension: Secondary | ICD-10-CM

## 2022-07-31 DIAGNOSIS — B351 Tinea unguium: Secondary | ICD-10-CM | POA: Diagnosis not present

## 2022-07-31 DIAGNOSIS — I5032 Chronic diastolic (congestive) heart failure: Secondary | ICD-10-CM | POA: Diagnosis not present

## 2022-07-31 NOTE — Patient Instructions (Signed)
Medication Instructions:  Your physician recommends that you continue on your current medications as directed. Please refer to the Current Medication list given to you today.  *If you need a refill on your cardiac medications before your next appointment, please call your pharmacy*  Follow-Up: At [facility], you and your health needs are our priority.  As part of our continuing mission to provide you with exceptional heart care, we have created designated Provider Care Teams.  These Care Teams include your primary Cardiologist (physician) and Advanced Practice Providers (APPs -  Physician Assistants and Nurse Practitioners) who all work together to provide you with the care you need, when you need it.  Your next appointment:   6 month(s)  The format for your next appointment:   In Person  Provider:   Candee Furbish, MD or Rudean Haskell, MD or Gwyndolyn Kaufman, MD  Important Information About Sugar

## 2022-07-31 NOTE — Progress Notes (Unsigned)
  Subjective:  Patient ID: [name], female    DOB: Mar 27, 1933,  MRN: [id number]  [name] presents to clinic today for {jgcomplaint:23593}  Chief Complaint  Patient presents with   Nail Problem    RFC PCP-William Harris PCP VST-05/29/2022   New problem(s): None. {jgcomplaint:23593}  PCP is Shirline Frees, MD.  Allergies  Allergen Reactions   Atorvastatin Other (See Comments)    Myalgias   Ceftin [Cefuroxime] Diarrhea   Cefuroxime Axetil Diarrhea   Clindamycin Hcl Itching   Clindamycin/Lincomycin Itching   Moxifloxacin Itching   Simvastatin Other (See Comments)    myalgias   Doxycycline Hyclate Diarrhea and Rash   Penicillins Rash    Review of Systems: Negative except as noted in the HPI.  Objective: No changes noted in today's physical examination. Vitals:   07/31/22 1527  BP: (!) 181/80   [name] is a pleasant 88 y.o. female {jgbodyhabitus:24098} AAO x 3.  Vascular Examination: Palpable pedal pulses b/l LE. Digital hair present b/l. Skin temperature gradient WNL b/l. No varicosities b/l. Trace edema noted BLE.  Dermatological Examination: Pedal skin with normal turgor, texture and tone b/l. No open wounds. No interdigital macerations b/l. Toenails 1-5 b/l thickened, discolored, dystrophic with subungual debris. There is pain on palpation to dorsal aspect of nailplates. No hyperkeratotic nor porokeratotic lesions present on today's visit.Marland Kitchen  Neurological Examination: Protective sensation intact with 10 gram monofilament b/l LE. Vibratory sensation intact b/l LE.   Musculoskeletal Examination: Muscle strength 5/5 to all LE muscle groups b/l. HAV with bunion bilaterally and hammertoes 2-5 b/l. Utilizes motorized chair for mobility assistance.  Assessment/Plan: 1. Pain due to onychomycosis of toenails of both feet     No orders of the defined types were placed in this encounter.   None {Jgplan:23602::"-Patient/POA to call should there be  question/concern in the interim."}   Return in about 3 months (around 10/30/2022).  Marzetta Board, DPM

## 2022-08-07 ENCOUNTER — Other Ambulatory Visit: Payer: Self-pay | Admitting: Adult Health

## 2022-08-07 DIAGNOSIS — I6523 Occlusion and stenosis of bilateral carotid arteries: Secondary | ICD-10-CM

## 2022-08-20 ENCOUNTER — Ambulatory Visit (HOSPITAL_COMMUNITY)
Admission: RE | Admit: 2022-08-20 | Discharge: 2022-08-20 | Disposition: A | Discharge status: Home / Self Care | Payer: Medicare Other | Source: Ambulatory Visit | Attending: Adult Health | Admitting: Adult Health

## 2022-08-20 DIAGNOSIS — I6523 Occlusion and stenosis of bilateral carotid arteries: Secondary | ICD-10-CM | POA: Diagnosis present

## 2022-08-20 NOTE — Progress Notes (Signed)
VASCULAR LAB    Carotid has been performed.  See CV proc for preliminary results.   Annamary Buschman, RVT 08/20/2022,

## 2022-08-21 ENCOUNTER — Other Ambulatory Visit: Payer: Self-pay | Admitting: Adult Health

## 2022-08-21 ENCOUNTER — Telehealth: Payer: Self-pay | Admitting: Adult Health

## 2022-08-21 DIAGNOSIS — I6523 Occlusion and stenosis of bilateral carotid arteries: Secondary | ICD-10-CM

## 2022-08-21 NOTE — Telephone Encounter (Signed)
[employer] medicare NPR sent to GI [phone] 

## 2022-08-22 ENCOUNTER — Telehealth: Payer: Self-pay | Admitting: Neurology

## 2022-08-22 ENCOUNTER — Ambulatory Visit (INDEPENDENT_AMBULATORY_CARE_PROVIDER_SITE_OTHER): Payer: Medicare Other | Admitting: Pulmonary Disease

## 2022-08-22 ENCOUNTER — Ambulatory Visit: Payer: Medicare Other | Admitting: Pulmonary Disease

## 2022-08-22 ENCOUNTER — Encounter: Payer: Self-pay | Admitting: Pulmonary Disease

## 2022-08-22 VITALS — BP 126/82 | HR 68 | Ht 59.0 in | Wt 201.0 lb

## 2022-08-22 DIAGNOSIS — J9601 Acute respiratory failure with hypoxia: Secondary | ICD-10-CM

## 2022-08-22 DIAGNOSIS — J432 Centrilobular emphysema: Secondary | ICD-10-CM

## 2022-08-22 DIAGNOSIS — J9602 Acute respiratory failure with hypercapnia: Secondary | ICD-10-CM | POA: Diagnosis not present

## 2022-08-22 LAB — PULMONARY FUNCTION TEST
DL/VA % pred: 86 %
DL/VA: 3.94 ml/min/mmHg/L
DLCO cor % pred: 114 %
DLCO cor: 12 ml/min/mmHg
DLCO unc % pred: 114 %
DLCO unc: 12 ml/min/mmHg
FEF 25-75 Post: 0.84 L/sec
FEF 25-75 Pre: 0.37 L/sec
FEF2575-%Change-Post: 131 %
FEF2575-%Pred-Post: 281 %
FEF2575-%Pred-Pre: 121 %
FEV1-%Change-Post: 21 %
FEV1-%Pred-Post: 187 %
FEV1-%Pred-Pre: 154 %
FEV1-Post: 0.88 L
FEV1-Pre: 0.72 L
FEV1FVC-%Change-Post: 10 %
FEV1FVC-%Pred-Pre: 97 %
FEV6-%Change-Post: 9 %
FEV6-%Pred-Post: 181 %
FEV6-%Pred-Pre: 165 %
FEV6-Post: 1.12 L
FEV6-Pre: 1.03 L
FEV6FVC-%Change-Post: 0 %
FEV6FVC-%Pred-Post: 116 %
FEV6FVC-%Pred-Pre: 116 %
FVC-%Change-Post: 9 %
FVC-%Pred-Post: 155 %
FVC-%Pred-Pre: 142 %
FVC-Post: 1.12 L
FVC-Pre: 1.03 L
Post FEV1/FVC ratio: 78 %
Post FEV6/FVC ratio: 100 %
Pre FEV1/FVC ratio: 70 %
Pre FEV6/FVC Ratio: 100 %
RV % pred: 153 %
RV: 2.76 L
TLC % pred: 142 %
TLC: 4.1 L

## 2022-08-22 NOTE — Patient Instructions (Addendum)
We will walk you today to see if your oxygen levels drop again  We will repeat an overnight oxygen test to see if your oxygen levels drop again during the night.   For now, recommend using 2L of oxygen with walking and at night when sleeping.   Use duoneb treatments every 6 hours   Use budesonide nebs twice daily  Your breathing tests show mild obstruction with significant response to albuterol.   Follow up in 6 months

## 2022-08-22 NOTE — Telephone Encounter (Signed)
Called the patient and reviewed the cartoid [place] results advising the patient that there wasn't enough information to see what everything looked like. Advised she recommends the patient have a CTA completed. Pt verbalized understanding. Pt had no questions at this time but was encouraged to call back if questions arise. She is already scheduled for the test

## 2022-08-22 NOTE — Progress Notes (Signed)
PFT Done today.

## 2022-08-22 NOTE — Progress Notes (Signed)
Synopsis: Referred in September 2023 for Respiratory Failure by Harlan Stains, MD  Subjective:   PATIENT ID: [name] GENDER: female DOB: 10/17/1932, MRN: [id number]   HPI  Chief Complaint  Patient presents with   Follow-up    F/U after PFT. States her breathing has been stable since last visit.    [name] is an 89 year old woman, former smoker with history of CVA, obesity, diastolic heart failure and COPD who returns to pulmonary clinic for respiratory failure.   She has been using duonebs and budesonide nebs twice daily with no issues. Reports her breathing has been ok.   PFTs today show mild obstruction with significant bronchodilator response.   Initial OV 04/20/22 She was admitted 8/7 to 8/11 for acute hypoxemic and hypercapnic respiratory failure with altered mentation. She was treated with Bipap and diuresis with improvement in her condition. She has been feeling much better since discharge. She saw Dr. Tamala Julian of cardiology 04/09/22, note reviewed  She has oxygen at home and using 2L throughout the day. She is not using it at night. She is doing duoneb treatments twice daily.   She tried CPAP at the hospital but she felt claustrophobic and did not tolerate it.   She quit smoking in 1973, she smoked for 10 years. She worked at [employer] previously and [employer]. She had second hand smoke at home and at work.   Past Medical History:  Diagnosis Date   Acute back pain with sciatica, unspecified laterality    Allergic rhinitis    BMI 40.0-44.9, adult (HCC)    Carotid artery disease ([place]) 05/11/2022   Carotid US 02/2022: Bilateral ICA 40-59   Cerebrovascular disease    Chronic diastolic (congestive) heart failure (HCC)    Colon polyp    COPD (chronic obstructive pulmonary disease) (HCC)    MILD   Disorder of bone    Estrogen deficiency    Excessive sleepiness    Hyperlipidemia    Hypertension    Incontinence of urine    Irregular heartbeat    Low back pain    Morbid  obesity (HCC)    OA (osteoarthritis)    OF THE KNEES   OAB (overactive bladder)    Osteopenia    Osteopenia    Primary osteoarthritis of both knees    Pure hypercholesterolemia    Shingles    Shingles    Stroke ([place])    Thyroid cyst    Thyroid cyst    Ulnar neuropathy of both upper extremities    Vertigo      Family History  Problem Relation Age of Onset   CAD Father    Breast cancer Neg Hx      Social History   Socioeconomic History   Marital status: Widowed    Spouse name: Not on file   Number of children: Not on file   Years of education: Not on file   Highest education level: Not on file  Occupational History   Not on file  Tobacco Use   Smoking status: Former   Smokeless tobacco: Never  Vaping Use   Vaping Use: Never used  Substance and Sexual Activity   Alcohol use: Not Currently   Drug use: Never   Sexual activity: Not on file  Other Topics Concern   Not on file  Social History Narrative   Lives alone   Right Handed   Drinks decaf   Social Determinants of Health   Financial Resource Strain: Not on  file  Food Insecurity: Not on file  Transportation Needs: Not on file  Physical Activity: Not on file  Stress: Not on file  Social Connections: Not on file  Intimate Partner Violence: Not on file     Allergies  Allergen Reactions   Atorvastatin Other (See Comments)    Myalgias   Ceftin [Cefuroxime] Diarrhea   Cefuroxime Axetil Diarrhea   Clindamycin Hcl Itching   Clindamycin/Lincomycin Itching   Moxifloxacin Itching   Simvastatin Other (See Comments)    myalgias   Doxycycline Hyclate Diarrhea and Rash   Penicillins Rash     Outpatient Medications Prior to Visit  Medication Sig Dispense Refill   albuterol (PROVENTIL) (2.5 MG/3ML) 0.083% nebulizer solution Take 3 mLs (2.5 mg total) by nebulization every 4 (four) hours as needed for wheezing or shortness of breath. 75 mL 1   amLODipine (NORVASC) 2.5 MG tablet Take 2.5 mg by mouth daily.      aspirin EC 81 MG EC tablet Take 1 tablet (81 mg total) by mouth daily. Swallow whole. 30 tablet 11   budesonide (PULMICORT) 0.25 MG/2ML nebulizer solution Take 2 mLs (0.25 mg total) by nebulization in the morning and at bedtime. 60 mL 12   Cholecalciferol (VITAMIN D PO) Take 1,000 Units by mouth daily.     dapagliflozin propanediol (FARXIGA) 10 MG TABS tablet Take 1 tablet (10 mg total) by mouth daily before breakfast. 90 tablet 3   docusate sodium (COLACE) 100 MG capsule Take 100 mg by mouth daily.     furosemide (LASIX) 20 MG tablet Take 1 tablet (20 mg total) by mouth daily. 30 tablet    ipratropium-albuterol (DUONEB) 0.5-2.5 (3) MG/3ML SOLN Take 3 mLs by nebulization 2 (two) times daily. 160 mL 1   Metoprolol Succinate 25 MG CS24 Take 25 mg by mouth daily.     OXYGEN Inhale into the lungs as directed. 2L     pravastatin (PRAVACHOL) 10 MG tablet Take 10 mg by mouth daily.     No facility-administered medications prior to visit.    Review of Systems  Constitutional:  Negative for chills, fever, malaise/fatigue and weight loss.  HENT:  Negative for congestion, sinus pain and sore throat.   Eyes: Negative.   Respiratory:  Positive for shortness of breath. Negative for cough, hemoptysis, sputum production and wheezing.   Cardiovascular:  Positive for leg swelling. Negative for chest pain, palpitations, orthopnea and claudication.  Gastrointestinal:  Negative for abdominal pain, heartburn, nausea and vomiting.  Genitourinary: Negative.   Musculoskeletal:  Negative for joint pain and myalgias.  Skin:  Negative for rash.  Neurological:  Negative for weakness.  Endo/Heme/Allergies: Negative.   Psychiatric/Behavioral: Negative.     Objective:   Vitals:   08/22/22 1355  BP: 126/82  Pulse: 68  SpO2: 95%  Weight: 201 lb (91.2 kg)  Height: '4\' 11"'$  (1.499 m)    Physical Exam Constitutional:      General: She is not in acute distress.    Appearance: She is obese. She is not  ill-appearing.  HENT:     Head: Normocephalic and atraumatic.  Eyes:     General: No scleral icterus.    Conjunctiva/sclera: Conjunctivae normal.  Cardiovascular:     Rate and Rhythm: Normal rate and regular rhythm.     Pulses: Normal pulses.     Heart sounds: Normal heart sounds. No murmur heard. Pulmonary:     Effort: Pulmonary effort is normal.     Breath sounds: Normal breath sounds. Decreased  air movement present. No wheezing, rhonchi or rales.  Musculoskeletal:     Right lower leg: No edema.     Left lower leg: No edema.  Skin:    General: Skin is warm and dry.  Neurological:     General: No focal deficit present.     Mental Status: She is alert.    CBC    Component Value Date/Time   WBC 11.4 (H) 03/22/2022 0542   RBC 4.98 03/22/2022 0542   HGB 14.3 03/22/2022 0542   HCT 47.6 (H) 03/22/2022 0542   PLT 178 03/22/2022 0542   MCV 95.6 03/22/2022 0542   MCH 28.7 03/22/2022 0542   MCHC 30.0 03/22/2022 0542   RDW 15.1 03/22/2022 0542   LYMPHSABS 0.5 (L) 03/21/2022 0117   MONOABS 0.3 03/21/2022 0117   EOSABS 0.0 03/21/2022 0117   BASOSABS 0.0 03/21/2022 0117      Latest Ref Rng & Units 04/24/2022    8:05 AM 03/23/2022    5:54 AM 03/22/2022    5:42 AM  BMP  Glucose 70 - 99 mg/dL 103  98  95   BUN 8 - 27 mg/dL '16  22  25   '$ Creatinine 0.57 - 1.00 mg/dL 0.95  0.94  0.93   BUN/Creat Ratio 12 - 28 17     Sodium 134 - 144 mmol/L 140  138  139   Potassium 3.5 - 5.2 mmol/L 5.2  5.6  5.1   Chloride 96 - 106 mmol/L 97  95  92   CO2 20 - 29 mmol/L 33  38  41   Calcium 8.7 - 10.3 mg/dL 9.5  9.0  9.1    Chest imaging: CXR 03/20/22 1. Improved lung base ventilation since yesterday. Residual patchy opacity and possible small pleural effusions. 2. No new cardiopulmonary abnormality.  CT Chest 06/03/06 Emphysema noted  PFT:    Latest Ref Rng & Units 08/22/2022   12:41 PM  PFT Results  FVC-Pre L 1.03  P  FVC-Predicted Pre % 142  P  FVC-Post L 1.12  P  FVC-Predicted Post  % 155  P  Pre FEV1/FVC % % 70  P  Post FEV1/FCV % % 78  P  FEV1-Pre L 0.72  P  FEV1-Predicted Pre % 154  P  FEV1-Post L 0.88  P  DLCO uncorrected ml/min/mmHg 12.00  P  DLCO UNC% % 114  P  DLCO corrected ml/min/mmHg 12.00  P  DLCO COR %Predicted % 114  P  DLVA Predicted % 86  P  TLC L 4.10  P  TLC % Predicted % 142  P  RV % Predicted % 153  P    P Preliminary result    Labs:  Path:  Echo 03/21/22: LV EF 60-65%. RV systolic function and size is normal. Mildly elevated PASP. LA and RA normal size.   Heart Catheterization:  ONO 04/2022 51 mins with SpO2 less than 88%  Assessment & Plan:   Centrilobular emphysema (HCC) - Plan: Pulse oximetry, overnight  Discussion: [name] is an 89 year old woman, former smoker with history of CVA, obesity, diastolic heart failure and COPD who is referred to pulmonary clinic for respiratory failure.   Her resting room air oxygen level is 95% today and has been stable when she checks it at home.  She is to use 2L of oxygen with ambulation and can be on room air at rest. We will repeat an overnight study to confirm she needs night time oxygen.  She has history of emphysema as noted on CT Chest scan in 2007. PFTs show mild obstruction with significant bronchodilator response.  She is to continue duoneb nebulizer treatments q6 hours and budesonide nebulizer 0.'25mg'$  twice daily for ICS/LAMA/LABA therapy.   She did not tolerate Bipap mask at the hospital due to feeling claustrophobic so we will hold off on sleep testing.   Follow up in 6 months.  Freda Jackson, MD [facility] Office: [phone]   Current Outpatient Medications:    albuterol (PROVENTIL) (2.5 MG/3ML) 0.083% nebulizer solution, Take 3 mLs (2.5 mg total) by nebulization every 4 (four) hours as needed for wheezing or shortness of breath., Disp: 75 mL, Rfl: 1   amLODipine (NORVASC) 2.5 MG tablet, Take 2.5 mg by mouth daily., Disp: , Rfl:    aspirin  EC 81 MG EC tablet, Take 1 tablet (81 mg total) by mouth daily. Swallow whole., Disp: 30 tablet, Rfl: 11   budesonide (PULMICORT) 0.25 MG/2ML nebulizer solution, Take 2 mLs (0.25 mg total) by nebulization in the morning and at bedtime., Disp: 60 mL, Rfl: 12   Cholecalciferol (VITAMIN D PO), Take 1,000 Units by mouth daily., Disp: , Rfl:    dapagliflozin propanediol (FARXIGA) 10 MG TABS tablet, Take 1 tablet (10 mg total) by mouth daily before breakfast., Disp: 90 tablet, Rfl: 3   docusate sodium (COLACE) 100 MG capsule, Take 100 mg by mouth daily., Disp: , Rfl:    furosemide (LASIX) 20 MG tablet, Take 1 tablet (20 mg total) by mouth daily., Disp: 30 tablet, Rfl:    ipratropium-albuterol (DUONEB) 0.5-2.5 (3) MG/3ML SOLN, Take 3 mLs by nebulization 2 (two) times daily., Disp: 160 mL, Rfl: 1   Metoprolol Succinate 25 MG CS24, Take 25 mg by mouth daily., Disp: , Rfl:    OXYGEN, Inhale into the lungs as directed. 2L, Disp: , Rfl:    pravastatin (PRAVACHOL) 10 MG tablet, Take 10 mg by mouth daily., Disp: , Rfl:

## 2022-08-22 NOTE — Telephone Encounter (Signed)
-----   Message from Frann Rider, NP sent at 08/21/2022  2:54 PM EST ----- Please advise patient that they had difficulty getting accurate results from recent carotid duplex and recommended proceeding with CTA neck to further evaluate.  Order was placed, she will be called to schedule. Thank you.

## 2022-08-25 ENCOUNTER — Encounter: Payer: Self-pay | Admitting: Pulmonary Disease

## 2022-08-31 ENCOUNTER — Ambulatory Visit: Payer: Medicare Other

## 2022-09-10 NOTE — Progress Notes (Deleted)
[facility] [address] [place]. [place] [address] [phone]       OFFICE FOLLOW-UP NOTE  [name] Date of Birth:  11-Jul-1933 Medical Record Number:  [id number]    Primary neurologist: Dr. Leonie Man Reason for visit: stroke follow up   No chief complaint on file.     HPI:   Update 09/11/2022 JM: Patient returns for 87-monthstroke follow-up.  Overall stable, denies new or reoccurring stroke/TIA symptoms.  Compliant on aspirin and Crestor 20 mg daily, denies side effects.  Blood pressure today ***.  Did have repeat carotid ultrasound 08/2022 but difficulty getting accurate reading therefore is scheduled for CTA neck 2/7 as recommended.        History provided for reference purposes only Update 03/07/2022 JM: Patient returns for 87-monthstroke follow-up.  Overall stable since prior visit without new or reoccurring stroke/TIA or neurological symptoms.    Reports compliance on aspirin without side effects.      At prior visit, lipid panel showed LDL 93 and recommended increasing Crestor from 5 mg to 20 mg daily but decided to increase Crestor to 10 mg daily due to potential side effects.  Repeat lipid panel back in May with PCP which showed LDL at 91.  She is currently taking Crestor 10 mg daily and tolerating well.  She is willing to further increase dosage at this time.  Blood pressure today 146/72.   Repeat carotid ultrasound 02/2022 showed continued bilateral ICA 40 to 59% stenosis, similar to prior study in 07/2021.  She does report some swelling in legs bilaterally, has appt with cardiologist Dr. SmTamala Julianext month to further discuss   No other concerns at this time.  Update, 08/31/2021, JM: [name] here today for a stroke follow-up unaccompanied.  Stable from stroke standpoint, denies new stroke/TIA symptoms or any seizure activity. C/o left sided neck stiffness onset 07/2021, denies pain, numbness/tingling or weakness - Multiple calls and  MyChart messages regarding this complaint - she was greatly concerned about this having to do with her L ICA stenosis - repeat carotid ultrasound 07/2021 showed b/l ICA 40 to 59% stenosis (prior L ICA 40 to 59% stenosis, R ICA 1 to 39% stenosis). Remains on Crestor and Aspirin without side effects. BP today elevated (reports this is normal at appointments). Routinely monitors at home and typically 120-130s/80s.  Labs A1c 5.9, LDL 94 (06/2021). She is unsure if she was taking a statin when cholesterol was last checked.  Routinely follows with Ortho receiving lumbar injections.  No further concerns at this time.  Initial visit, 01/25/2021, Dr SeLeonie ManMs. PaKlammers a pleasant 89year old African-American lady seen today for initial office follow-up visit after hospital consultation for stroke in March 2022.  History is obtained from patient and review of electronic medical records and personally reviewed pertinent imaging films in PACS.  She has past medical history of hypertension, hyperlipidemia, obesity, COPD who presented with 1 and half weeks of shortness of breath and leg swelling on 10/23/2020.  Primary care physician had stopped hydrochlorothiazide due to increasing leg swellings.  Patient developed respiratory distress and was intubated.  She developed some slurred speech and involuntary rhythmic movements of right greater than left upper extremity and some periods of confusion and disorientation possibly from hypoxia.  MRI scan of the brain was obtained which showed tiny punctate left frontal white matter infarct which is felt to be clinically silent and incidental.  Carotid ultrasound showed 40 to 59% left ICA and  1-39% right ICA stenosis.  2D echo showed ejection fraction 65 to 70%.  LDL cholesterol was elevated at 105 mg percent hemoglobin A1c was borderline at 5.9.  EEG showed diffuse generalized slowing without epileptiform activity.  Patient was started on aspirin for stroke prevention.  She states she  is doing well since discharge exam no recurrent stroke or TIA symptoms.  She is tolerating aspirin well without bruising or bleeding.  Her blood pressure at home is quite good controlled but today it is elevated in office at 159/72.  Is tolerating Crestor well without muscle aches and pains.  She does use a wheeled walker for ambulation but no further neurological issues.  There is no prior history of strokes TIA ,seizures, migraines or neurological problems.    ROS:   14 system review of systems is positive for those listed in HPI and, all other systems negative  PMH:  Past Medical History:  Diagnosis Date   Acute back pain with sciatica, unspecified laterality    Allergic rhinitis    BMI 40.0-44.9, adult (HCC)    Carotid artery disease ([place]) 05/11/2022   Carotid US 02/2022: Bilateral ICA 40-59   Cerebrovascular disease    Chronic diastolic (congestive) heart failure (HCC)    Colon polyp    COPD (chronic obstructive pulmonary disease) (HCC)    MILD   Disorder of bone    Estrogen deficiency    Excessive sleepiness    Hyperlipidemia    Hypertension    Incontinence of urine    Irregular heartbeat    Low back pain    Morbid obesity (HCC)    OA (osteoarthritis)    OF THE KNEES   OAB (overactive bladder)    Osteopenia    Osteopenia    Primary osteoarthritis of both knees    Pure hypercholesterolemia    Shingles    Shingles    Stroke ([place])    Thyroid cyst    Thyroid cyst    Ulnar neuropathy of both upper extremities    Vertigo     Social History:  Social History   Socioeconomic History   Marital status: Widowed    Spouse name: Not on file   Number of children: Not on file   Years of education: Not on file   Highest education level: Not on file  Occupational History   Not on file  Tobacco Use   Smoking status: Former   Smokeless tobacco: Never  Vaping Use   Vaping Use: Never used  Substance and Sexual Activity   Alcohol use: Not Currently   Drug use: Never    Sexual activity: Not on file  Other Topics Concern   Not on file  Social History Narrative   Lives alone   Right Handed   Drinks decaf   Social Determinants of Health   Financial Resource Strain: Not on file  Food Insecurity: Not on file  Transportation Needs: Not on file  Physical Activity: Not on file  Stress: Not on file  Social Connections: Not on file  Intimate Partner Violence: Not on file    Medications:   Current Outpatient Medications on File Prior to Visit  Medication Sig Dispense Refill   albuterol (PROVENTIL) (2.5 MG/3ML) 0.083% nebulizer solution Take 3 mLs (2.5 mg total) by nebulization every 4 (four) hours as needed for wheezing or shortness of breath. 75 mL 1   amLODipine (NORVASC) 2.5 MG tablet Take 2.5 mg by mouth daily.     aspirin EC 81 MG  EC tablet Take 1 tablet (81 mg total) by mouth daily. Swallow whole. 30 tablet 11   budesonide (PULMICORT) 0.25 MG/2ML nebulizer solution Take 2 mLs (0.25 mg total) by nebulization in the morning and at bedtime. 60 mL 12   Cholecalciferol (VITAMIN D PO) Take 1,000 Units by mouth daily.     dapagliflozin propanediol (FARXIGA) 10 MG TABS tablet Take 1 tablet (10 mg total) by mouth daily before breakfast. 90 tablet 3   docusate sodium (COLACE) 100 MG capsule Take 100 mg by mouth daily.     furosemide (LASIX) 20 MG tablet Take 1 tablet (20 mg total) by mouth daily. 30 tablet    ipratropium-albuterol (DUONEB) 0.5-2.5 (3) MG/3ML SOLN Take 3 mLs by nebulization 2 (two) times daily. 160 mL 1   Metoprolol Succinate 25 MG CS24 Take 25 mg by mouth daily.     OXYGEN Inhale into the lungs as directed. 2L     pravastatin (PRAVACHOL) 10 MG tablet Take 10 mg by mouth daily.     No current facility-administered medications on file prior to visit.    Allergies:   Allergies  Allergen Reactions   Atorvastatin Other (See Comments)    Myalgias   Ceftin [Cefuroxime] Diarrhea   Cefuroxime Axetil Diarrhea   Clindamycin Hcl Itching    Clindamycin/Lincomycin Itching   Moxifloxacin Itching   Simvastatin Other (See Comments)    myalgias   Doxycycline Hyclate Diarrhea and Rash   Penicillins Rash    Physical Exam There were no vitals filed for this visit.  There is no height or weight on file to calculate BMI.   General: obese very pleasant elderly african american lady, seated, in no evident distress Head: head normocephalic and atraumatic.  Neck: supple with no carotid or supraclavicular bruits Cardiovascular: regular rate and rhythm, no murmurs Musculoskeletal: No deformity,  Skin:  no rash/petichiae Vascular:  Normal pulses all extremities, 2+ pitting edema BLE  Neurologic Exam Mental Status: Awake and fully alert. Fluent speech and language. Oriented to place and time. Recent and remote memory intact. Attention span, concentration and fund of knowledge appropriate. Mood and affect appropriate.  Cranial Nerves: Pupils equal, briskly reactive to light. Extraocular movements full without nystagmus.  But has mild exotropia of the left eye visual fields full to confrontation. Hearing intact. Facial sensation intact. Face, tongue, palate moves normally and symmetrically.  Motor: Normal bulk and tone. Normal strength in all tested extremity muscles. Sensory.: intact to touch ,pinprick .position and vibratory sensation.  Coordination: Rapid alternating movements normal in all extremities. Finger-to-nose and heel-to-shin performed accurately bilaterally. Gait and Station: deferred as RW not present, currently sitting in "Zinger" power chair Reflexes: 1+ and symmetric. Toes downgoing.        ASSESSMENT: 89 year old African-American lady with silent small left frontal white matter lacunar infarct from small vessel disease in March 2022 without residual deficits discovered during hospitalization for respiratory failure and CHF.  Vascular risk factors of hyperlipidemia, hypertension, carotid stenosis, obesity and  CHF    Lacunar Infarct:  Continue aspirin 81 mg daily and increase Crestor to 20 mg daily for secondary stroke prevention.  Discussed secondary stroke prevention measures and importance of close PCP follow up for aggressive stroke risk factor management including BP goal<130/90, and HLD with LDL goal<70 LDL 93 (08/2021) -increased Crestor from 5 mg to 10 mg daily, repeat LDL 91 (12/2021), increase further to 20 mg daily. Can repeat lipids with PCP in 6-8 weeks. If remains elevated, would recommend further increasing but  if unable to tolerate increased doses, would recommend consideration of initiating Zetia in addition to low-dose Crestor or consider PCSK9 inhibitor  Carotid stenosis:  Carotid ultrasound 08/2022 bilateral 1 to 39% stenosis although noted in right ICA secondary to plaque formation, body habitus and disparities between prior studies, recommended CTA to discern degree of stenosis.  Currently scheduled for CTA 2/7 Carotid ultrasound 02/2022 bilateral 40 to 59% stenosis Carotid ultrasound 07/2021  b/l ICA 40 to 59% stenosis;  Carotid ultrasound 10/2020 showed R ICA 40 to 59% stenosis and left ICA 1 to 39% stenosis    Follow up in 6 months or call earlier if needed - if remains stable at that time, can plan on follow-up as needed   CC:  Shirline Frees, MD   I spent 32 minutes of face-to-face and non-face-to-face time with patient.  This included previsit chart review, lab review, study review, order entry, electronic health record documentation, patient education and discussion regarding history of prior stroke including secondary stroke prevention measures and aggressive stroke risk factor management, importance of follow-up with cholesterol management, carotid stenosis and surveillance monitoring, and answered all other questions to patient satisfaction  Frann Rider, AGNP-BC  [facility], [facility] [address] [place], [place] [address]  Phone  [phone] Fax [phone] Note: This document was prepared with digital dictation and possible smart phrase technology. Any transcriptional errors that result from this process are unintentional.

## 2022-09-11 ENCOUNTER — Ambulatory Visit: Payer: Medicare Other | Admitting: Adult Health

## 2022-09-11 ENCOUNTER — Telehealth: Payer: Self-pay | Admitting: Adult Health

## 2022-09-11 NOTE — Telephone Encounter (Signed)
Pt cancelled appt due to have not had CT scan she may be need to review for that appt.

## 2022-09-19 ENCOUNTER — Ambulatory Visit
Admission: RE | Admit: 2022-09-19 | Discharge: 2022-09-19 | Disposition: A | Discharge status: Home / Self Care | Payer: Medicare Other | Source: Ambulatory Visit | Attending: Adult Health | Admitting: Adult Health

## 2022-09-19 DIAGNOSIS — I6523 Occlusion and stenosis of bilateral carotid arteries: Secondary | ICD-10-CM

## 2022-09-19 MED ORDER — IOPAMIDOL (ISOVUE-370) INJECTION 76%
75.0000 mL | Freq: Once | INTRAVENOUS | Status: AC | PRN
  Administered 2022-09-19: 75 mL via INTRAVENOUS

## 2022-09-21 ENCOUNTER — Telehealth: Payer: Self-pay | Admitting: Adult Health

## 2022-09-21 NOTE — Telephone Encounter (Signed)
I spoke to Dr. Nevada Crane in radiology for an urgent report and I also reviewed the images.  She has a string sign near the origin of the left internal carotid artery due to a very calcified plaque.  She has significant stenosis elsewhere as is in the report.  This differs from the recent vascular ultrasound.  Due to the severity of the stenosis, I spoke to Ms. [place].  She has no new symptoms.  I discussed that based on the extent of stenosis, I would recommend that she see a vascular surgeon.  As she is 55, she is not that interested and any surgical option.  She will continue the aspirin and go to the emergency room if any new or worsening neurologic symptoms.  She currently is not scheduled to see anybody in the office until July.  Are one of you able to see her in the next week or 2 to go over this further with her and discuss options

## 2022-09-21 NOTE — Telephone Encounter (Signed)
[facility] Olivia Mackie) Dr. Lars Pinks requesting to speak with the on call neurologist to discuss CT of the neck. Regarding pt's results on this. Call [phone]

## 2022-09-24 ENCOUNTER — Other Ambulatory Visit: Payer: Self-pay | Admitting: Adult Health

## 2022-09-24 DIAGNOSIS — I6523 Occlusion and stenosis of bilateral carotid arteries: Secondary | ICD-10-CM

## 2022-09-24 NOTE — Telephone Encounter (Signed)
Can patient please be called to be scheduled for a sooner visit than the one currently scheduled, per NP

## 2022-09-24 NOTE — Telephone Encounter (Signed)
Patient was scheduled last month but cancelled prior to visit, unsure why she was rescheduled so far out. Prior to seeing this telephone note, I already placed referral to vascular surgery d/t worsening carotid stenosis compared to prior carotid ultrasound. Can we please see if patient can be seen sooner for routine follow up and can further discuss imaging report at that time? Thank you!

## 2022-09-30 NOTE — Progress Notes (Unsigned)
Office Note     CC: Progression in bilateral carotid artery stenosis Requesting Provider:  Shirline Frees, MD  HPI: [name] is a 89 y.o. (1933-06-27) female presenting at the request of .Shirline Frees, MD for evaluation of progression in bilateral carotid artery stenosis.  On exam today, [name] was doing well.  Originally from [place], she moved to [place] years ago with [employer].  She continues to live independently, but does use a motorized wheelchair to aid in ambulation.  Denies history of stroke, TIA, amaurosis since an episode back in 2023.  She made it very clear she is not interested in surgery at her age.   The pt is  on a statin for cholesterol management.  The pt is  on a daily aspirin.   Other AC:  - The pt is  on medication for hypertension.   The pt is  diabetic.  Tobacco hx:  none  Past Medical History:  Diagnosis Date   Acute back pain with sciatica, unspecified laterality    Allergic rhinitis    BMI 40.0-44.9, adult (HCC)    Carotid artery disease ([place]) 05/11/2022   Carotid US 02/2022: Bilateral ICA 40-59   Cerebrovascular disease    Chronic diastolic (congestive) heart failure (HCC)    Colon polyp    COPD (chronic obstructive pulmonary disease) (HCC)    MILD   Disorder of bone    Estrogen deficiency    Excessive sleepiness    Hyperlipidemia    Hypertension    Incontinence of urine    Irregular heartbeat    Low back pain    Morbid obesity (HCC)    OA (osteoarthritis)    OF THE KNEES   OAB (overactive bladder)    Osteopenia    Osteopenia    Primary osteoarthritis of both knees    Pure hypercholesterolemia    Shingles    Shingles    Stroke ([place])    Thyroid cyst    Thyroid cyst    Ulnar neuropathy of both upper extremities    Vertigo     Past Surgical History:  Procedure Laterality Date   BREAST EXCISIONAL BIOPSY Right    benign    Social History   Socioeconomic History   Marital status: Widowed    Spouse name:  Not on file   Number of children: Not on file   Years of education: Not on file   Highest education level: Not on file  Occupational History   Not on file  Tobacco Use   Smoking status: Former   Smokeless tobacco: Never  Vaping Use   Vaping Use: Never used  Substance and Sexual Activity   Alcohol use: Not Currently   Drug use: Never   Sexual activity: Not on file  Other Topics Concern   Not on file  Social History Narrative   Lives alone   Right Handed   Drinks decaf   Social Determinants of Health   Financial Resource Strain: Not on file  Food Insecurity: Not on file  Transportation Needs: Not on file  Physical Activity: Not on file  Stress: Not on file  Social Connections: Not on file  Intimate Partner Violence: Not on file   Family History  Problem Relation Age of Onset   CAD Father    Breast cancer Neg Hx     Current Outpatient Medications  Medication Sig Dispense Refill   albuterol (PROVENTIL) (2.5 MG/3ML) 0.083% nebulizer solution Take 3 mLs (2.5 mg total) by  nebulization every 4 (four) hours as needed for wheezing or shortness of breath. 75 mL 1   amLODipine (NORVASC) 2.5 MG tablet Take 2.5 mg by mouth daily.     aspirin EC 81 MG EC tablet Take 1 tablet (81 mg total) by mouth daily. Swallow whole. 30 tablet 11   budesonide (PULMICORT) 0.25 MG/2ML nebulizer solution Take 2 mLs (0.25 mg total) by nebulization in the morning and at bedtime. 60 mL 12   Cholecalciferol (VITAMIN D PO) Take 1,000 Units by mouth daily.     dapagliflozin propanediol (FARXIGA) 10 MG TABS tablet Take 1 tablet (10 mg total) by mouth daily before breakfast. 90 tablet 3   docusate sodium (COLACE) 100 MG capsule Take 100 mg by mouth daily.     furosemide (LASIX) 20 MG tablet Take 1 tablet (20 mg total) by mouth daily. 30 tablet    ipratropium-albuterol (DUONEB) 0.5-2.5 (3) MG/3ML SOLN Take 3 mLs by nebulization 2 (two) times daily. 160 mL 1   Metoprolol Succinate 25 MG CS24 Take 25 mg by  mouth daily.     OXYGEN Inhale into the lungs as directed. 2L     pravastatin (PRAVACHOL) 10 MG tablet Take 10 mg by mouth daily.     No current facility-administered medications for this visit.    Allergies  Allergen Reactions   Atorvastatin Other (See Comments)    Myalgias   Ceftin [Cefuroxime] Diarrhea   Cefuroxime Axetil Diarrhea   Clindamycin Hcl Itching   Clindamycin/Lincomycin Itching   Moxifloxacin Itching   Simvastatin Other (See Comments)    myalgias   Doxycycline Hyclate Diarrhea and Rash   Penicillins Rash     REVIEW OF SYSTEMS:  [X]$  denotes positive finding, [ ]$  denotes negative finding Cardiac  Comments:  Chest pain or chest pressure:    Shortness of breath upon exertion:    Short of breath when lying flat:    Irregular heart rhythm:        Vascular    Pain in calf, thigh, or hip brought on by ambulation:    Pain in feet at night that wakes you up from your sleep:     Blood clot in your veins:    Leg swelling:         Pulmonary    Oxygen at home:    Productive cough:     Wheezing:         Neurologic    Sudden weakness in arms or legs:     Sudden numbness in arms or legs:     Sudden onset of difficulty speaking or slurred speech:    Temporary loss of vision in one eye:     Problems with dizziness:         Gastrointestinal    Blood in stool:     Vomited blood:         Genitourinary    Burning when urinating:     Blood in urine:        Psychiatric    Major depression:         Hematologic    Bleeding problems:    Problems with blood clotting too easily:        Skin    Rashes or ulcers:        Constitutional    Fever or chills:      PHYSICAL EXAMINATION:  There were no vitals filed for this visit.  General:  WDWN in NAD; vital signs documented above Gait: Not observed  HENT: WNL, normocephalic Pulmonary: normal non-labored breathing , without wheezing Cardiac: regular HR Abdomen: soft, NT, no masses Skin: without  rashes Vascular Exam/Pulses:  Right Left  Radial 2+ (normal) 2+ (normal)      Femoral    Popliteal    DP 2+ (normal) 2+ (normal)  PT     Extremities: without ischemic changes, without Gangrene , without cellulitis; without open wounds;  Musculoskeletal: no muscle wasting or atrophy  Neurologic: A&O X 3;  No focal weakness or paresthesias are detected Psychiatric:  The pt has Normal affect.   Non-Invasive Vascular Imaging:    Right carotid system: Bulky calcified brachiocephalic artery plaque with 62 % stenosis with respect to the distal vessel (series 9, image 106). Right CCA origin is normal. Tortuous right CCA with mild calcified plaque below the larynx.   But bulky calcified plaque beginning 1 cm below the right carotid bifurcation (series 9, image 118 and series 6, image 49) resulting in high-grade stenosis numerically estimated at 77 % with respect to the distal vessel (series 6, image 49) proximal to the bifurcation. And bulky calcified plaque continues at the left ICA origin and bulb. But less than 50 % stenosis with respect to the distal vessel at those areas. The proximal left ICA is retropharyngeal, and tortuous to the skull base.   Left carotid system: Calcified plaque at the left CCA origin without stenosis. Bulky calcified plaque in the left CCA at the level of the thyroid with estimated 76 % stenosis with respect to the distal vessel on series 6, image 76. But the left ICA remains patent. Bulky calcified plaque throughout the left ICA origin and bulb with a 5 mm segment of radiographic string sign stenosis on series 9, image 122, also series 6, image 53. But the vessel remains patent. Distal bulb also heavily calcified, with additional 60% stenosis there (series 6, image 47). But the vessel remains patent and is tortuous below the skull base.  Anterior circulation: Both ICA siphons are patent but tortuous and heavily calcified. On the left moderate to severe  supraclinoid stenosis results on series 9, image 104. On the right there is moderate cavernous segment (series 7, image 212) and severe anterior genu stenosis (image 20) plus moderate supraclinoid stenosis distally (series 9, image 104).    ASSESSMENT/PLAN: JONIKA ENGDAHL is a 89 y.o. female presenting with asymptomatic bilateral carotid stenosis, left greater than right.    I had a long discussion with the patient regarding her bilateral internal carotid artery stenosis.  Literature demonstrates a reduction in stroke rate from 11% to 5% in 5 years, effectively reducing the risk of stroke by half.  We discussed that with her age, comorbidities, and the anatomy of her carotid arteries, she is at higher risk than normal for perioperative stroke with both carotid endarterectomy or transcarotid revascularization.  [name] made it very clear she was not interested in any type of surgery and was okay with an 11% risk over the next five years.   I told her I respected her decision, but I am also available should she have a change of heart.  She plans to discuss the above with her sister, but does not plan on calling our office to schedule surgery.  I asked [name] to call my office should any questions or concerns arise.  She can follow-up with me as needed at this time.   Broadus John, MD [facility] [phone]

## 2022-10-01 ENCOUNTER — Encounter: Payer: Self-pay | Admitting: Vascular Surgery

## 2022-10-01 ENCOUNTER — Ambulatory Visit: Payer: Medicare Other | Admitting: Vascular Surgery

## 2022-10-01 VITALS — BP 168/76 | HR 98 | Temp 97.6°F | Resp 14 | Ht 59.5 in | Wt 199.0 lb

## 2022-10-01 DIAGNOSIS — I6523 Occlusion and stenosis of bilateral carotid arteries: Secondary | ICD-10-CM

## 2022-10-09 ENCOUNTER — Ambulatory Visit: Payer: Medicare Other | Admitting: Adult Health

## 2022-11-21 ENCOUNTER — Ambulatory Visit (INDEPENDENT_AMBULATORY_CARE_PROVIDER_SITE_OTHER): Payer: Medicare Other | Admitting: Podiatry

## 2022-11-21 ENCOUNTER — Encounter: Payer: Self-pay | Admitting: Podiatry

## 2022-11-21 DIAGNOSIS — M79675 Pain in left toe(s): Secondary | ICD-10-CM | POA: Diagnosis not present

## 2022-11-21 DIAGNOSIS — M79674 Pain in right toe(s): Secondary | ICD-10-CM | POA: Diagnosis not present

## 2022-11-21 DIAGNOSIS — B351 Tinea unguium: Secondary | ICD-10-CM | POA: Diagnosis not present

## 2022-11-21 NOTE — Progress Notes (Signed)
  Subjective:  Patient ID: [name], female    DOB: 1932/10/24,  MRN: [id number]  [name] presents to clinic today for painful thick toenails that are difficult to trim. Pain interferes with ambulation. Aggravating factors include wearing enclosed shoe gear. Pain is relieved with periodic professional debridement.  Chief Complaint  Patient presents with   NAIL CARE    RFC    Callouses    RIGHT FOOT CALLUS   New problem(s): None.   PCP is Johny Blamer, MD.  Allergies  Allergen Reactions   Atorvastatin Other (See Comments)    Myalgias   Ceftin [Cefuroxime] Diarrhea   Cefuroxime Axetil Diarrhea   Clindamycin Hcl Itching   Clindamycin/Lincomycin Itching   Moxifloxacin Itching   Simvastatin Other (See Comments)    myalgias   Doxycycline Hyclate Diarrhea and Rash   Penicillins Rash    Review of Systems: Negative except as noted in the HPI.  Objective: No changes noted in today's physical examination. There were no vitals filed for this visit. [name] is a pleasant 89 y.o. female in NAD. AAO x 3.  Vascular Examination: Palpable pedal pulses b/l LE. Digital hair present b/l. Skin temperature gradient WNL b/l. No varicosities b/l. Trace edema noted BLE.  Dermatological Examination: Pedal skin with normal turgor, texture and tone b/l. No open wounds. No interdigital macerations b/l. Toenails 1-5 b/l thickened, discolored, dystrophic with subungual debris. There is pain on palpation to dorsal aspect of nailplates. No hyperkeratotic nor porokeratotic lesions present on today's visit.Marland Kitchen  Neurological Examination: Protective sensation intact with 10 gram monofilament b/l LE. Vibratory sensation intact b/l LE.   Musculoskeletal Examination: Muscle strength 5/5 to all LE muscle groups b/l. HAV with bunion bilaterally and hammertoes 2-5 b/l. Utilizes motorized chair for mobility assistance.  Assessment/Plan: 1. Pain due to onychomycosis of toenails of both feet     -Consent given for treatment as described below: -Examined patient. -Patient to continue soft, supportive shoe gear daily. -Toenails 1-5 b/l were debrided in length and girth with sterile nail nippers and dremel without iatrogenic bleeding.  -Patient/POA to call should there be question/concern in the interim.   Return in about 3 months (around 02/20/2023).  Freddie Breech, DPM

## 2023-02-05 NOTE — Progress Notes (Deleted)
Cardiology Office Note:   Date:  02/05/2023  ID:  [name], DOB 06/06/1933, MRN [id number]  History of Present Illness:   [name] is a 89 y.o. female with a history of COPD, HTN, chronic diastolic HF, and obesity who was previously followed by Dr. Katrinka Blazing who now presents to clinic for follow-up.  Was last seen by Dr. Katrinka Blazing in 07/2022 where she was stable from a CV standpoint. Had stopped her O2 as she felt her breathing was significantly improved.  Today, ***  Past Medical History:  Diagnosis Date   Acute back pain with sciatica, unspecified laterality    Allergic rhinitis    BMI 40.0-44.9, adult (HCC)    Carotid artery disease (HCC) 05/11/2022   Carotid US 02/2022: Bilateral ICA 40-59   Cerebrovascular disease    Chronic diastolic (congestive) heart failure (HCC)    Colon polyp    COPD (chronic obstructive pulmonary disease) (HCC)    MILD   Disorder of bone    Estrogen deficiency    Excessive sleepiness    Hyperlipidemia    Hypertension    Incontinence of urine    Irregular heartbeat    Low back pain    Morbid obesity (HCC)    OA (osteoarthritis)    OF THE KNEES   OAB (overactive bladder)    Osteopenia    Osteopenia    Primary osteoarthritis of both knees    Pure hypercholesterolemia    Shingles    Shingles    Stroke (HCC)    Thyroid cyst    Thyroid cyst    Ulnar neuropathy of both upper extremities    Vertigo      ROS: ***  Studies Reviewed:    EKG:  ***  Cardiac Studies & Procedures     STRESS TESTS  NM MYOCAR MULTI W/SPECT W 09/15/2014   ECHOCARDIOGRAM  ECHOCARDIOGRAM COMPLETE 03/21/2022  Narrative ECHOCARDIOGRAM REPORT    Patient Name:   [name] Date of Exam: 03/21/2022 Medical Rec #:  [id number]     Height:       59.0 in Accession #:    [id number]    Weight:       209.9 lb Date of Birth:  [birth year]     BSA:          1.883 m Patient Age:    89 years      BP:           140/66 mmHg Patient Gender: F             HR:           87  bpm. Exam Location:  Inpatient  Procedure: 2D Echo, Cardiac Doppler, Color Doppler and Intracardiac Opacification Agent  Indications:    CHF  History:        Patient has prior history of Echocardiogram examinations, most recent 10/22/2020. COPD; Risk Factors:Dyslipidemia and Hypertension.  Sonographer:    Gertie Fey MHA, RDMS, RVT, RDCS Referring Phys: [id number] JESSICA U [facility]   Sonographer Comments: Image acquisition challenging due to patient body habitus and Image acquisition challenging due to respiratory motion. IMPRESSIONS   1. Left ventricular ejection fraction, by estimation, is 60 to 65%. The left ventricle has normal function. The left ventricle has no regional wall motion abnormalities. Left ventricular diastolic function could not be evaluated. 2. Right ventricular systolic function is normal. The right ventricular size is normal. There is mildly elevated pulmonary artery systolic pressure. 3. The mitral valve is  normal in structure. No evidence of mitral valve regurgitation. No evidence of mitral stenosis. Moderate mitral annular calcification. 4. The aortic valve is normal in structure. Aortic valve regurgitation is not visualized. No aortic stenosis is present. 5. The inferior vena cava is normal in size with greater than 50% respiratory variability, suggesting right atrial pressure of 3 mmHg.  FINDINGS Left Ventricle: Left ventricular ejection fraction, by estimation, is 60 to 65%. The left ventricle has normal function. The left ventricle has no regional wall motion abnormalities. The left ventricular internal cavity size was normal in size. There is no left ventricular hypertrophy. Left ventricular diastolic function could not be evaluated due to mitral annular calcification (moderate or greater). Left ventricular diastolic function could not be evaluated.  Right Ventricle: The right ventricular size is normal. No increase in right ventricular wall thickness. Right  ventricular systolic function is normal. There is mildly elevated pulmonary artery systolic pressure. The tricuspid regurgitant velocity is 3.14 m/s, and with an assumed right atrial pressure of 3 mmHg, the estimated right ventricular systolic pressure is 42.4 mmHg.  Left Atrium: Left atrial size was normal in size.  Right Atrium: Right atrial size was normal in size.  Pericardium: There is no evidence of pericardial effusion. Presence of epicardial fat layer.  Mitral Valve: The mitral valve is normal in structure. Moderate mitral annular calcification. No evidence of mitral valve regurgitation. No evidence of mitral valve stenosis.  Tricuspid Valve: The tricuspid valve is normal in structure. Tricuspid valve regurgitation is not demonstrated. No evidence of tricuspid stenosis.  Aortic Valve: The aortic valve is normal in structure. Aortic valve regurgitation is not visualized. No aortic stenosis is present. Aortic valve mean gradient measures 2.0 mmHg. Aortic valve peak gradient measures 3.3 mmHg. Aortic valve area, by VTI measures 1.67 cm.  Pulmonic Valve: The pulmonic valve was normal in structure. Pulmonic valve regurgitation is not visualized. No evidence of pulmonic stenosis.  Aorta: The aortic root is normal in size and structure.  Venous: The inferior vena cava is normal in size with greater than 50% respiratory variability, suggesting right atrial pressure of 3 mmHg.  IAS/Shunts: No atrial level shunt detected by color flow Doppler.   LEFT VENTRICLE PLAX 2D LVIDd:         5.40 cm     Diastology LVIDs:         3.60 cm     LV e' medial:    7.65 cm/s LV PW:         0.80 cm     LV E/e' medial:  12.6 LV IVS:        0.70 cm     LV e' lateral:   6.15 cm/s LVOT diam:     1.40 cm     LV E/e' lateral: 15.6 LV SV:         25 LV SV Index:   13 LVOT Area:     1.54 cm  LV Volumes (MOD) LV vol d, MOD A4C: 46.1 ml LV vol s, MOD A4C: 16.0 ml LV SV MOD A4C:     46.1 ml  RIGHT  VENTRICLE RV S prime:     12.10 cm/s  LEFT ATRIUM         Index LA diam:    4.20 cm 2.23 cm/m AORTIC VALVE AV Area (Vmax):    1.55 cm AV Area (Vmean):   1.47 cm AV Area (VTI):     1.67 cm AV Vmax:  91.40 cm/s AV Vmean:          62.600 cm/s AV VTI:            0.149 m AV Peak Grad:      3.3 mmHg AV Mean Grad:      2.0 mmHg LVOT Vmax:         92.00 cm/s LVOT Vmean:        59.900 cm/s LVOT VTI:          0.162 m LVOT/AV VTI ratio: 1.09  AORTA Ao Root diam: 1.90 cm  MITRAL VALVE                TRICUSPID VALVE MV Area (PHT): 3.48 cm     TR Peak grad:   39.4 mmHg MV Decel Time: 218 msec     TR Vmax:        314.00 cm/s MV E velocity: 96.20 cm/s MV A velocity: 121.00 cm/s  SHUNTS MV E/A ratio:  0.80         Systemic VTI:  0.16 m Systemic Diam: 1.40 cm  Kardie Tobb DO Electronically signed by Thomasene Ripple DO Signature Date/Time: 03/21/2022/3:17:57 PM    Final              Risk Assessment/Calculations:   {Does this patient have ATRIAL FIBRILLATION?[phone]} No BP recorded.  {Refresh Note OR Click here to enter BP  :1}***        Physical Exam:   VS:  There were no vitals taken for this visit.   Wt Readings from Last 3 Encounters:  10/01/22 199 lb (90.3 kg)  08/22/22 201 lb (91.2 kg)  07/31/22 200 lb 6.4 oz (90.9 kg)     GEN: Well nourished, well developed in no acute distress NECK: No JVD; No carotid bruits CARDIAC: ***RRR, no murmurs, rubs, gallops RESPIRATORY:  Clear to auscultation without rales, wheezing or rhonchi  ABDOMEN: Soft, non-tender, non-distended EXTREMITIES:  No edema; No deformity   ASSESSMENT AND PLAN:   #Chronic Diastolic HF: -Currently euvolemic and compensated on exam -Continue lasix 20mg  daily -Continue farxiga 10mg  daily -Low Na diet  #HTN: -Controlled and at goal <130/90 -Continue amlodipine 10mg  daily -Continue metop 25mg  XL daily  #HLD: -Continue pravastatin 10mg  daily  #COPD: -On O2 therapy *** -Follow-up with  Pulm as scheduled     {Are you ordering a CV Procedure (e.g. stress test, cath, DCCV, TEE, etc)?   Press F2        :[id number]}   Signed, Meriam Sprague, MD

## 2023-02-07 ENCOUNTER — Encounter: Payer: Self-pay | Admitting: Cardiology

## 2023-02-07 ENCOUNTER — Ambulatory Visit: Payer: Medicare Other | Attending: Cardiology | Admitting: Cardiology

## 2023-02-21 ENCOUNTER — Ambulatory Visit: Payer: Medicare Other | Admitting: Adult Health

## 2023-03-26 ENCOUNTER — Ambulatory Visit: Payer: Medicare Other | Admitting: Pulmonary Disease

## 2023-03-26 ENCOUNTER — Telehealth: Payer: Self-pay | Admitting: Pulmonary Disease

## 2023-03-26 NOTE — Telephone Encounter (Signed)
Patient would like to discuss oxygen usage. Patient phone number is [phone].

## 2023-03-27 NOTE — Telephone Encounter (Signed)
Patient checking on message for oxygen. Patient phone number is [phone].

## 2023-03-29 NOTE — Telephone Encounter (Signed)
Patient would like an earlier appointment and needs a few other issues addressed.

## 2023-04-03 NOTE — Telephone Encounter (Signed)
Left message for patient to call back or send mychart message to discuss concerns

## 2023-04-10 ENCOUNTER — Ambulatory Visit: Payer: Medicare Other | Admitting: Podiatry

## 2023-04-10 ENCOUNTER — Encounter: Payer: Self-pay | Admitting: Podiatry

## 2023-04-10 DIAGNOSIS — M79674 Pain in right toe(s): Secondary | ICD-10-CM | POA: Diagnosis not present

## 2023-04-10 DIAGNOSIS — B351 Tinea unguium: Secondary | ICD-10-CM | POA: Diagnosis not present

## 2023-04-10 DIAGNOSIS — M79675 Pain in left toe(s): Secondary | ICD-10-CM | POA: Diagnosis not present

## 2023-04-13 NOTE — Progress Notes (Signed)
  Subjective:  Patient ID: [name], female    DOB: November 17, 1932,  MRN: [id number]  [name] presents to clinic today for painful elongated mycotic toenails 1-5 bilaterally which are tender when wearing enclosed shoe gear. Pain is relieved with periodic professional debridement.  Chief Complaint  Patient presents with   Nail Problem    RFC,Referring Provider Noberto Retort, MD,lov:08/24      New problem(s): None.   PCP is Noberto Retort, MD.  Allergies  Allergen Reactions   Atorvastatin Other (See Comments)    Myalgias   Ceftin [Cefuroxime] Diarrhea   Cefuroxime Axetil Diarrhea   Clindamycin Hcl Itching   Clindamycin/Lincomycin Itching   Moxifloxacin Itching   Simvastatin Other (See Comments)    myalgias   Doxycycline Hyclate Diarrhea and Rash   Penicillins Rash    Review of Systems: Negative except as noted in the HPI.  Objective: No changes noted in today's physical examination. There were no vitals filed for this visit. [name] is a pleasant 89 y.o. female obese in NAD. AAO x 3.  Vascular Examination: Palpable pedal pulses b/l LE. Digital hair present b/l. Skin temperature gradient WNL b/l. No varicosities b/l. Trace edema noted BLE.  Dermatological Examination: Pedal skin with normal turgor, texture and tone b/l. No open wounds. No interdigital macerations b/l. Toenails 1-5 b/l thickened, discolored, dystrophic with subungual debris. There is pain on palpation to dorsal aspect of nailplates. No hyperkeratotic nor porokeratotic lesions present on today's visit.Marland Kitchen  Neurological Examination: Protective sensation intact with 10 gram monofilament b/l LE. Vibratory sensation intact b/l LE.   Musculoskeletal Examination: Muscle strength 5/5 to all LE muscle groups b/l. HAV with bunion bilaterally and hammertoes 2-5 b/l. Utilizes motorized chair for mobility assistance.  Assessment/Plan: 1. Pain due to onychomycosis of toenails of both feet      -Patient was evaluated and treated. All patient's and/or POA's questions/concerns answered on today's visit. -No new findings. No new orders. -Patient to continue soft, supportive shoe gear daily. -Mycotic toenails 1-5 bilaterally were debrided in length and girth with sterile nail nippers and dremel without incident. -Patient/POA to call should there be question/concern in the interim.   Return in about 3 months (around 07/11/2023).  Freddie Breech, DPM

## 2023-05-02 ENCOUNTER — Encounter: Payer: Self-pay | Admitting: Primary Care

## 2023-05-02 ENCOUNTER — Ambulatory Visit: Payer: Medicare Other | Admitting: Primary Care

## 2023-05-02 VITALS — BP 136/78 | Temp 98.2°F | Ht 59.75 in | Wt 210.6 lb

## 2023-05-02 DIAGNOSIS — J432 Centrilobular emphysema: Secondary | ICD-10-CM | POA: Diagnosis not present

## 2023-05-02 DIAGNOSIS — J42 Unspecified chronic bronchitis: Secondary | ICD-10-CM | POA: Diagnosis not present

## 2023-05-02 DIAGNOSIS — J9611 Chronic respiratory failure with hypoxia: Secondary | ICD-10-CM

## 2023-05-02 MED ORDER — IPRATROPIUM-ALBUTEROL 0.5-2.5 (3) MG/3ML IN SOLN
3.0000 mL | Freq: Two times a day (BID) | RESPIRATORY_TRACT | 1 refills | Status: DC
Start: 2023-05-02 — End: 2023-12-04

## 2023-05-02 MED ORDER — BUDESONIDE 0.25 MG/2ML IN SUSP: 0.2500 mg | Freq: Two times a day (BID) | RESPIRATORY_TRACT | 12 refills | Status: DC

## 2023-05-02 MED ORDER — ALBUTEROL SULFATE (2.5 MG/3ML) 0.083% IN NEBU: 2.5000 mg | INHALATION_SOLUTION | RESPIRATORY_TRACT | 1 refills | Status: AC | PRN

## 2023-05-02 NOTE — Assessment & Plan Note (Addendum)
-   Stable: Continue 2L supplement oxygen with exertion and at bedtime to maintain O2 > 88-90%  - ONO on 05/07/23 showed patient spent 51 min with SpO2 <88%.  - DME order for best fit eval for POC

## 2023-05-02 NOTE — Patient Instructions (Addendum)
Recommendations: - Use budesonide nebulizer every morning and evening (orange box) - Use ipratropium-albuterol nebulizer morning and evening (maroon color box) - Use albuterol only as needed (blue box)  Orders: Best fit eval for POC/smaller   Follow-up: 6 months with Dr. Francine Graven

## 2023-05-02 NOTE — Assessment & Plan Note (Signed)
-   Overall stable interval; No acute respiratory complaints. Shortness of breath is baseline - Continue budesonide nebulizer and ipratropium-albuterol nebulizer TWICE daily - Use albuterol every 6 hours only as needed for breakthrough shortness of breath/wheezing

## 2023-05-02 NOTE — Progress Notes (Signed)
@Patient  ID: [name], female    DOB: 07-06-1933, 89 y.o.   MRN: [id number]  Chief Complaint  Patient presents with   Follow-up    Sob-same,cough-clear, c/o POC is too heavy    Referring provider: Noberto Retort, MD  HPI: 89 year old female, former smoker.  Past medical history significant for hypertension, stroke, coronary artery disease, heart failure with preserved ejection fraction, COPD, centrilobular emphysema, chronic respiratory failure, allergic rhinitis.  Patient of Dr. Francine Graven, last seen in office on 08/22/2022.  05/02/2023 Followed by our office for COPD/emphysema and chronic respiratory failure. Pulmonary function testing in January 2024 showed mild obstruction with significant bronchodilator response. Emphysema noted on CT chest in 2007  Patient presents today for a 73-month follow-up. She fells better. Shortness of breath is the same. Cough is minimal with clear sputum. She ran out of ipratropium-albuterol medication several months ago. She is using budesonide nebulizer 1-2 times a day. She is using 2 L of oxygen with ambulation and at night. POC is too heavy for her too carry and she would like a lighter one. ONO on 05/07/23 showed patient spent 51 min with SpO2 <88%.   Allergies  Allergen Reactions   Atorvastatin Other (See Comments)    Myalgias   Ceftin [Cefuroxime] Diarrhea   Cefuroxime Axetil Diarrhea   Clindamycin Hcl Itching   Clindamycin/Lincomycin Itching   Moxifloxacin Itching   Simvastatin Other (See Comments)    myalgias   Doxycycline Hyclate Diarrhea and Rash   Penicillins Rash    Immunization History  Administered Date(s) Administered   Influenza,inj,quad, With Preservative 05/13/2018    Past Medical History:  Diagnosis Date   Acute back pain with sciatica, unspecified laterality    Allergic rhinitis    BMI 40.0-44.9, adult (HCC)    Carotid artery disease (HCC) 05/11/2022   Carotid US 02/2022: Bilateral ICA 40-59   Cerebrovascular disease     Chronic diastolic (congestive) heart failure (HCC)    Colon polyp    COPD (chronic obstructive pulmonary disease) (HCC)    MILD   Disorder of bone    Estrogen deficiency    Excessive sleepiness    Hyperlipidemia    Hypertension    Incontinence of urine    Irregular heartbeat    Low back pain    Morbid obesity (HCC)    OA (osteoarthritis)    OF THE KNEES   OAB (overactive bladder)    Osteopenia    Osteopenia    Primary osteoarthritis of both knees    Pure hypercholesterolemia    Shingles    Shingles    Stroke (HCC)    Thyroid cyst    Thyroid cyst    Ulnar neuropathy of both upper extremities    Vertigo     Tobacco History: Social History   Tobacco Use  Smoking Status Former   Types: Cigarettes  Smokeless Tobacco Never   Counseling given: Not Answered   Outpatient Medications Prior to Visit  Medication Sig Dispense Refill   amLODipine (NORVASC) 2.5 MG tablet Take 2.5 mg by mouth daily.     aspirin EC 81 MG EC tablet Take 1 tablet (81 mg total) by mouth daily. Swallow whole. 30 tablet 11   Cholecalciferol (VITAMIN D PO) Take 1,000 Units by mouth daily.     docusate sodium (COLACE) 100 MG capsule Take 100 mg by mouth daily.     furosemide (LASIX) 20 MG tablet Take 1 tablet (20 mg total) by mouth daily. 30 tablet  Metoprolol Succinate 25 MG CS24 Take 25 mg by mouth daily.     OXYGEN Inhale into the lungs as directed. 2L     pravastatin (PRAVACHOL) 10 MG tablet Take 10 mg by mouth daily.     albuterol (PROVENTIL) (2.5 MG/3ML) 0.083% nebulizer solution Take 3 mLs (2.5 mg total) by nebulization every 4 (four) hours as needed for wheezing or shortness of breath. 75 mL 1   dapagliflozin propanediol (FARXIGA) 10 MG TABS tablet Take 1 tablet (10 mg total) by mouth daily before breakfast. (Patient not taking: Reported on 05/02/2023) 90 tablet 3   budesonide (PULMICORT) 0.25 MG/2ML nebulizer solution Take 2 mLs (0.25 mg total) by nebulization in the morning and at bedtime.  (Patient not taking: Reported on 05/02/2023) 60 mL 12   ipratropium-albuterol (DUONEB) 0.5-2.5 (3) MG/3ML SOLN Take 3 mLs by nebulization 2 (two) times daily. (Patient not taking: Reported on 05/02/2023) 160 mL 1   No facility-administered medications prior to visit.   Review of Systems  Review of Systems  Constitutional: Negative.   HENT: Negative.    Respiratory:  Negative for cough, shortness of breath and wheezing.    Physical Exam  BP 136/78 (BP Location: Left Arm, Cuff Size: Large)   Temp 98.2 F (36.8 C) (Temporal)   Ht 4' 11.75" (1.518 m)   Wt 210 lb 9.6 oz (95.5 kg)   SpO2 90%   BMI 41.47 kg/m  Physical Exam Constitutional:      Appearance: Normal appearance.  HENT:     Head: Normocephalic and atraumatic.     Mouth/Throat:     Mouth: Mucous membranes are moist.     Pharynx: Oropharynx is clear.  Cardiovascular:     Rate and Rhythm: Normal rate and regular rhythm.     Comments: +1 BLE edema Pulmonary:     Effort: Pulmonary effort is normal.     Breath sounds: Normal breath sounds. No wheezing or rhonchi.  Musculoskeletal:        General: Normal range of motion.  Skin:    General: Skin is warm and dry.  Neurological:     General: No focal deficit present.     Mental Status: She is alert and oriented to person, place, and time. Mental status is at baseline.  Psychiatric:        Mood and Affect: Mood normal.        Behavior: Behavior normal.        Thought Content: Thought content normal.        Judgment: Judgment normal.      Lab Results:  CBC    Component Value Date/Time   WBC 11.4 (H) 03/22/2022 0542   RBC 4.98 03/22/2022 0542   HGB 14.3 03/22/2022 0542   HCT 47.6 (H) 03/22/2022 0542   PLT 178 03/22/2022 0542   MCV 95.6 03/22/2022 0542   MCH 28.7 03/22/2022 0542   MCHC 30.0 03/22/2022 0542   RDW 15.1 03/22/2022 0542   LYMPHSABS 0.5 (L) 03/21/2022 0117   MONOABS 0.3 03/21/2022 0117   EOSABS 0.0 03/21/2022 0117   BASOSABS 0.0 03/21/2022 0117     BMET    Component Value Date/Time   NA 140 04/24/2022 0805   K 5.2 04/24/2022 0805   CL 97 04/24/2022 0805   CO2 33 (H) 04/24/2022 0805   GLUCOSE 103 (H) 04/24/2022 0805   GLUCOSE 98 03/23/2022 0554   BUN 16 04/24/2022 0805   CREATININE 0.95 04/24/2022 0805   CALCIUM 9.5 04/24/2022 0805  GFRNONAA 59 (L) 03/23/2022 0554   GFRAA  02/04/2009 0345    >60        The eGFR has been calculated using the MDRD equation. This calculation has not been validated in all clinical situations. eGFR's persistently <60 mL/min signify possible Chronic Kidney Disease.    BNP    Component Value Date/Time   BNP 376.4 (H) 03/19/2022 1656    ProBNP    Component Value Date/Time   PROBNP 235 04/24/2022 0805    Imaging: No results found.   Assessment & Plan:   COPD (chronic obstructive pulmonary disease) (HCC) - Overall stable interval; No acute respiratory complaints. Shortness of breath is baseline - Continue budesonide nebulizer and ipratropium-albuterol nebulizer TWICE daily - Use albuterol every 6 hours only as needed for breakthrough shortness of breath/wheezing   Chronic respiratory failure with hypoxia (HCC) - Stable: Continue 2L supplement oxygen with exertion and at bedtime to maintain O2 > 88-90%  - ONO on 05/07/23 showed patient spent 51 min with SpO2 <88%.  - DME order for best fit eval for POC   Glenford Bayley, NP 05/02/2023

## 2023-05-17 ENCOUNTER — Encounter (HOSPITAL_COMMUNITY): Payer: Self-pay

## 2023-05-17 ENCOUNTER — Emergency Department (HOSPITAL_COMMUNITY)
Admission: EM | Admit: 2023-05-17 | Discharge: 2023-05-18 | Discharge status: Against Medical Advice | Payer: Medicare Other | Attending: Emergency Medicine | Admitting: Emergency Medicine

## 2023-05-17 ENCOUNTER — Other Ambulatory Visit: Payer: Self-pay

## 2023-05-17 DIAGNOSIS — R2681 Unsteadiness on feet: Secondary | ICD-10-CM | POA: Diagnosis present

## 2023-05-17 DIAGNOSIS — Z5321 Procedure and treatment not carried out due to patient leaving prior to being seen by health care provider: Secondary | ICD-10-CM | POA: Diagnosis not present

## 2023-05-17 NOTE — ED Triage Notes (Signed)
Pt woke up this morning, when she stood up to move she felt very unsteady on her feet. Pt stated she did not have anything to eat, but since then she has not had the issue for the rest of today. Family/friend wanted pt checked out.

## 2023-06-18 ENCOUNTER — Ambulatory Visit: Payer: Medicare Other | Admitting: Pulmonary Disease

## 2023-06-20 ENCOUNTER — Other Ambulatory Visit: Payer: Self-pay | Admitting: Family Medicine

## 2023-06-20 DIAGNOSIS — Z1231 Encounter for screening mammogram for malignant neoplasm of breast: Secondary | ICD-10-CM

## 2023-07-22 ENCOUNTER — Ambulatory Visit (INDEPENDENT_AMBULATORY_CARE_PROVIDER_SITE_OTHER): Payer: Medicare Other | Admitting: Podiatry

## 2023-07-22 ENCOUNTER — Encounter: Payer: Self-pay | Admitting: Podiatry

## 2023-07-22 DIAGNOSIS — B351 Tinea unguium: Secondary | ICD-10-CM | POA: Diagnosis not present

## 2023-07-22 DIAGNOSIS — M79675 Pain in left toe(s): Secondary | ICD-10-CM

## 2023-07-22 DIAGNOSIS — M79674 Pain in right toe(s): Secondary | ICD-10-CM | POA: Diagnosis not present

## 2023-07-22 NOTE — Progress Notes (Signed)
Subjective:  Patient ID: [name], female    DOB: February 06, 1933,  MRN: [id number]  [name] presents to clinic today for:  Chief Complaint  Patient presents with   Debridement    Trim toenails-diabetic - 5.6  . Patient notes nails are thick, discolored, elongated and painful in shoegear when trying to ambulate.    PCP is Noberto Retort, MD.  Past Medical History:  Diagnosis Date   Acute back pain with sciatica, unspecified laterality    Allergic rhinitis    BMI 40.0-44.9, adult (HCC)    Carotid artery disease (HCC) 05/11/2022   Carotid US 02/2022: Bilateral ICA 40-59   Cerebrovascular disease    Chronic diastolic (congestive) heart failure (HCC)    Colon polyp    COPD (chronic obstructive pulmonary disease) (HCC)    MILD   Disorder of bone    Estrogen deficiency    Excessive sleepiness    Hyperlipidemia    Hypertension    Incontinence of urine    Irregular heartbeat    Low back pain    Morbid obesity (HCC)    OA (osteoarthritis)    OF THE KNEES   OAB (overactive bladder)    Osteopenia    Osteopenia    Primary osteoarthritis of both knees    Pure hypercholesterolemia    Shingles    Shingles    Stroke (HCC)    Thyroid cyst    Thyroid cyst    Ulnar neuropathy of both upper extremities    Vertigo     Past Surgical History:  Procedure Laterality Date   BREAST EXCISIONAL BIOPSY Right    benign    Allergies  Allergen Reactions   Atorvastatin Other (See Comments)    Myalgias   Ceftin [Cefuroxime] Diarrhea   Cefuroxime Axetil Diarrhea   Clindamycin Hcl Itching   Clindamycin/Lincomycin Itching   Dapagliflozin     Other Reaction(s): numbness/tingling   Moxifloxacin Itching   Simvastatin Other (See Comments)    myalgias   Doxycycline Hyclate Diarrhea and Rash   Penicillins Rash    Review of Systems: Negative except as noted in the HPI.  Objective:  There were no vitals filed for this visit.  [name] is a pleasant 89 y.o. female  in NAD. AAO x 3.  Vascular Examination: Capillary refill time is 3-5 seconds to toes bilateral. Trace palpable pedal pulses b/l LE. Digital hair sparse b/l.  Skin temperature gradient WNL b/l. No varicosities b/l. No cyanosis noted b/l.  +1 edema bilateral legs/ankles.  Dermatological Examination: Pedal skin with normal turgor, texture and tone b/l. No open wounds. No interdigital macerations b/l. Toenails x10 are 3mm thick, discolored, dystrophic with subungual debris. There is pain with compression of the nail plates.  They are elongated x10  Assessment/Plan: 1. Pain due to onychomycosis of toenails of both feet    The mycotic toenails were sharply debrided x10 with sterile nail nippers and a power debriding burr to decrease bulk/thickness and length.    Return in about 3 months (around 10/20/2023) for RFC.   [name], DPM, FACFAS [facility]     [address], [place] [address]  Office [phone]  Fax [phone]

## 2023-07-29 ENCOUNTER — Ambulatory Visit
Admission: RE | Admit: 2023-07-29 | Discharge: 2023-07-29 | Disposition: A | Discharge status: Home / Self Care | Payer: Medicare Other | Source: Ambulatory Visit | Attending: Family Medicine | Admitting: Family Medicine

## 2023-07-29 DIAGNOSIS — Z1231 Encounter for screening mammogram for malignant neoplasm of breast: Secondary | ICD-10-CM

## 2023-08-21 ENCOUNTER — Emergency Department (HOSPITAL_COMMUNITY): Payer: Medicare Other

## 2023-08-21 ENCOUNTER — Inpatient Hospital Stay (HOSPITAL_COMMUNITY)
Admission: EM | Admit: 2023-08-21 | Discharge: 2023-08-30 | DRG: 280 | Disposition: A | Discharge status: Skilled Nursing Facility | Payer: Medicare Other | Source: Ambulatory Visit | Attending: Internal Medicine | Admitting: Internal Medicine

## 2023-08-21 ENCOUNTER — Other Ambulatory Visit: Payer: Self-pay

## 2023-08-21 DIAGNOSIS — R9431 Abnormal electrocardiogram [ECG] [EKG]: Secondary | ICD-10-CM | POA: Insufficient documentation

## 2023-08-21 DIAGNOSIS — Z1152 Encounter for screening for COVID-19: Secondary | ICD-10-CM

## 2023-08-21 DIAGNOSIS — I509 Heart failure, unspecified: Secondary | ICD-10-CM

## 2023-08-21 DIAGNOSIS — E7800 Pure hypercholesterolemia, unspecified: Secondary | ICD-10-CM | POA: Diagnosis present

## 2023-08-21 DIAGNOSIS — Z881 Allergy status to other antibiotic agents status: Secondary | ICD-10-CM

## 2023-08-21 DIAGNOSIS — I110 Hypertensive heart disease with heart failure: Secondary | ICD-10-CM | POA: Diagnosis not present

## 2023-08-21 DIAGNOSIS — Z8673 Personal history of transient ischemic attack (TIA), and cerebral infarction without residual deficits: Secondary | ICD-10-CM

## 2023-08-21 DIAGNOSIS — R0602 Shortness of breath: Secondary | ICD-10-CM | POA: Diagnosis not present

## 2023-08-21 DIAGNOSIS — I214 Non-ST elevation (NSTEMI) myocardial infarction: Secondary | ICD-10-CM | POA: Insufficient documentation

## 2023-08-21 DIAGNOSIS — J9622 Acute and chronic respiratory failure with hypercapnia: Secondary | ICD-10-CM | POA: Diagnosis present

## 2023-08-21 DIAGNOSIS — I5030 Unspecified diastolic (congestive) heart failure: Secondary | ICD-10-CM | POA: Diagnosis present

## 2023-08-21 DIAGNOSIS — J9611 Chronic respiratory failure with hypoxia: Secondary | ICD-10-CM | POA: Diagnosis present

## 2023-08-21 DIAGNOSIS — Z79899 Other long term (current) drug therapy: Secondary | ICD-10-CM

## 2023-08-21 DIAGNOSIS — I6523 Occlusion and stenosis of bilateral carotid arteries: Secondary | ICD-10-CM | POA: Diagnosis present

## 2023-08-21 DIAGNOSIS — R0902 Hypoxemia: Secondary | ICD-10-CM

## 2023-08-21 DIAGNOSIS — J9601 Acute respiratory failure with hypoxia: Secondary | ICD-10-CM | POA: Diagnosis present

## 2023-08-21 DIAGNOSIS — Z888 Allergy status to other drugs, medicaments and biological substances status: Secondary | ICD-10-CM

## 2023-08-21 DIAGNOSIS — Z91199 Patient's noncompliance with other medical treatment and regimen due to unspecified reason: Secondary | ICD-10-CM

## 2023-08-21 DIAGNOSIS — R251 Tremor, unspecified: Secondary | ICD-10-CM | POA: Diagnosis present

## 2023-08-21 DIAGNOSIS — Z9981 Dependence on supplemental oxygen: Secondary | ICD-10-CM

## 2023-08-21 DIAGNOSIS — I10 Essential (primary) hypertension: Secondary | ICD-10-CM | POA: Diagnosis present

## 2023-08-21 DIAGNOSIS — G9341 Metabolic encephalopathy: Secondary | ICD-10-CM | POA: Diagnosis present

## 2023-08-21 DIAGNOSIS — Z66 Do not resuscitate: Secondary | ICD-10-CM | POA: Diagnosis present

## 2023-08-21 DIAGNOSIS — J810 Acute pulmonary edema: Secondary | ICD-10-CM

## 2023-08-21 DIAGNOSIS — Z6841 Body Mass Index (BMI) 40.0 and over, adult: Secondary | ICD-10-CM

## 2023-08-21 DIAGNOSIS — R7989 Other specified abnormal findings of blood chemistry: Secondary | ICD-10-CM | POA: Diagnosis present

## 2023-08-21 DIAGNOSIS — Z87891 Personal history of nicotine dependence: Secondary | ICD-10-CM

## 2023-08-21 DIAGNOSIS — Z7982 Long term (current) use of aspirin: Secondary | ICD-10-CM

## 2023-08-21 DIAGNOSIS — E8729 Other acidosis: Secondary | ICD-10-CM | POA: Diagnosis present

## 2023-08-21 DIAGNOSIS — I5033 Acute on chronic diastolic (congestive) heart failure: Secondary | ICD-10-CM

## 2023-08-21 DIAGNOSIS — J9621 Acute and chronic respiratory failure with hypoxia: Secondary | ICD-10-CM

## 2023-08-21 DIAGNOSIS — Z8249 Family history of ischemic heart disease and other diseases of the circulatory system: Secondary | ICD-10-CM

## 2023-08-21 DIAGNOSIS — Z880 Allergy status to penicillin: Secondary | ICD-10-CM

## 2023-08-21 DIAGNOSIS — D7589 Other specified diseases of blood and blood-forming organs: Secondary | ICD-10-CM | POA: Insufficient documentation

## 2023-08-21 DIAGNOSIS — R600 Localized edema: Secondary | ICD-10-CM | POA: Insufficient documentation

## 2023-08-21 DIAGNOSIS — J441 Chronic obstructive pulmonary disease with (acute) exacerbation: Principal | ICD-10-CM | POA: Insufficient documentation

## 2023-08-21 LAB — BASIC METABOLIC PANEL
Anion gap: 10 (ref 5–15)
BUN: 22 mg/dL (ref 8–23)
CO2: 37 mmol/L — ABNORMAL HIGH (ref 22–32)
Calcium: 9.2 mg/dL (ref 8.9–10.3)
Chloride: 94 mmol/L — ABNORMAL LOW (ref 98–111)
Creatinine, Ser: 0.72 mg/dL (ref 0.44–1.00)
GFR, Estimated: >60 mL/min (ref >60)
Glucose, Bld: 111 mg/dL — ABNORMAL HIGH (ref 70–99)
Potassium: 4.4 mmol/L (ref 3.5–5.1)
Sodium: 141 mmol/L (ref 135–145)

## 2023-08-21 LAB — TROPONIN I (HIGH SENSITIVITY): Troponin I (High Sensitivity): 17 ng/L (ref <18)

## 2023-08-21 LAB — CBC WITH DIFFERENTIAL/PLATELET
Abs Immature Granulocytes: 0.04 10*3/uL (ref 0.00–0.07)
Basophils Absolute: 0 10*3/uL (ref 0.0–0.1)
Basophils Relative: 0 %
Eosinophils Absolute: 0 10*3/uL (ref 0.0–0.5)
Eosinophils Relative: 0 %
HCT: 45.8 % (ref 36.0–46.0)
Hemoglobin: 13.8 g/dL (ref 12.0–15.0)
Immature Granulocytes: 0 %
Lymphocytes Relative: 3 %
Lymphs Abs: 0.3 10*3/uL — ABNORMAL LOW (ref 0.7–4.0)
MCH: 30.3 pg (ref 26.0–34.0)
MCHC: 30.1 g/dL (ref 30.0–36.0)
MCV: 100.7 fL — ABNORMAL HIGH (ref 80.0–100.0)
Monocytes Absolute: 0.1 10*3/uL (ref 0.1–1.0)
Monocytes Relative: 1 %
Neutro Abs: 9.2 10*3/uL — ABNORMAL HIGH (ref 1.7–7.7)
Neutrophils Relative %: 96 %
Platelets: 144 10*3/uL — ABNORMAL LOW (ref 150–400)
RBC: 4.55 MIL/uL (ref 3.87–5.11)
RDW: 14.6 % (ref 11.5–15.5)
WBC: 9.7 10*3/uL (ref 4.0–10.5)
nRBC: 0 % (ref 0.0–0.2)

## 2023-08-21 LAB — RESP PANEL BY RT-PCR (RSV, FLU A&B, COVID)  RVPGX2
Influenza A by PCR: NEGATIVE
Influenza B by PCR: NEGATIVE
Resp Syncytial Virus by PCR: NEGATIVE
SARS Coronavirus 2 by RT PCR: NEGATIVE

## 2023-08-21 LAB — BRAIN NATRIURETIC PEPTIDE: B Natriuretic Peptide: 323.7 pg/mL — ABNORMAL HIGH (ref 0.0–100.0)

## 2023-08-21 MED ORDER — IPRATROPIUM-ALBUTEROL 0.5-2.5 (3) MG/3ML IN SOLN
3.0000 mL | Freq: Once | RESPIRATORY_TRACT | Status: AC
  Administered 2023-08-21: 3 mL via RESPIRATORY_TRACT
  Filled 2023-08-21: qty 3

## 2023-08-21 MED ORDER — FUROSEMIDE 10 MG/ML IJ SOLN
40.0000 mg | Freq: Once | INTRAMUSCULAR | Status: AC
  Administered 2023-08-21: 40 mg via INTRAVENOUS
  Filled 2023-08-21: qty 4

## 2023-08-21 MED ORDER — METHYLPREDNISOLONE SODIUM SUCC 125 MG IJ SOLR
125.0000 mg | Freq: Once | INTRAMUSCULAR | Status: AC
  Administered 2023-08-21: 125 mg via INTRAVENOUS
  Filled 2023-08-21: qty 2

## 2023-08-21 MED ORDER — ASPIRIN 81 MG PO CHEW
324.0000 mg | CHEWABLE_TABLET | Freq: Once | ORAL | Status: AC
  Administered 2023-08-21: 324 mg via ORAL
  Filled 2023-08-21: qty 4

## 2023-08-21 MED ORDER — SODIUM CHLORIDE 0.9 % IV SOLN
500.0000 mg | Freq: Once | INTRAVENOUS | Status: AC
  Administered 2023-08-21: 500 mg via INTRAVENOUS
  Filled 2023-08-21: qty 5

## 2023-08-21 MED ORDER — NITROGLYCERIN 2 % TD OINT
0.5000 [in_us] | TOPICAL_OINTMENT | Freq: Once | TRANSDERMAL | Status: AC
  Administered 2023-08-21: 0.5 [in_us] via TOPICAL
  Filled 2023-08-21: qty 1

## 2023-08-21 NOTE — ED Provider Notes (Signed)
 [place] [facility] AT [facility] Provider Note   CSN: [id number] Arrival date & time: 08/21/23  1717     History  Chief Complaint  Patient presents with   Shortness of Breath    [name] is a 89 y.o. female.  89 year old female with past medical history of CHF, hypertension, hyperlipidemia, and COPD presenting to the emergency department today with cough and shortness of breath.  The patient states she has not been wearing her oxygen at home as she was instructed to.  The patient is normally supposed be on 2 L nasal cannula.  She reports that she thinks that her portable oxygen is not working correctly.  She went to follow-up with her primary care provider today.  She had a coughing episode and became hypoxic.  She was sent to the emergency department at that time for further evaluation.  She denies any fevers.  Denies any chest pain.  She denies any hemoptysis or leg swelling.  To her baseline.   Shortness of Breath Associated symptoms: cough        Home Medications Prior to Admission medications   Medication Sig Start Date End Date Taking? Authorizing Provider  albuterol  (PROVENTIL ) (2.5 MG/3ML) 0.083% nebulizer solution Take 3 mLs (2.5 mg total) by nebulization every 4 (four) hours as needed for wheezing or shortness of breath. 05/02/23   Hope Almarie ORN, NP  amLODipine  (NORVASC ) 2.5 MG tablet Take 2.5 mg by mouth daily. 08/27/21   [provider]  aspirin  EC 81 MG EC tablet Take 1 tablet (81 mg total) by mouth daily. Swallow whole. 10/27/20   Ghimire, Donalda CHRISTELLA, MD  budesonide  (PULMICORT ) 0.25 MG/2ML nebulizer solution Take 2 mLs (0.25 mg total) by nebulization in the morning and at bedtime. 05/02/23   Hope Almarie ORN, NP  Cholecalciferol  (VITAMIN D  PO) Take 1,000 Units by mouth daily.    [provider]  dapagliflozin  propanediol (FARXIGA ) 10 MG TABS tablet Take 1 tablet (10 mg total) by mouth daily before breakfast. Patient not  taking: Reported on 05/02/2023 05/28/22   Claudene Victory ORN, MD  docusate sodium  (COLACE) 100 MG capsule Take 100 mg by mouth daily.    [provider]  furosemide  (LASIX ) 20 MG tablet Take 1 tablet (20 mg total) by mouth daily. 03/24/22   Vann, Jessica U, DO  ipratropium-albuterol  (DUONEB) 0.5-2.5 (3) MG/3ML SOLN Take 3 mLs by nebulization 2 (two) times daily. 05/02/23   Hope Almarie ORN, NP  Metoprolol  Succinate 25 MG CS24 Take 25 mg by mouth daily.    [provider]  OXYGEN Inhale into the lungs as directed. 2L    [provider]  pravastatin  (PRAVACHOL ) 10 MG tablet Take 10 mg by mouth daily. 06/12/22   [provider]      Allergies    Atorvastatin, Ceftin [cefuroxime], Cefuroxime axetil, Clindamycin hcl, Clindamycin/lincomycin, Dapagliflozin , Moxifloxacin, Simvastatin , Doxycycline hyclate, and Penicillins    Review of Systems   Review of Systems  Respiratory:  Positive for cough and shortness of breath.   All other systems reviewed and are negative.   Physical Exam Updated Vital Signs BP 137/85   Pulse (!) 103   Temp 97.8 F (36.6 C) (Oral)   Resp (!) 25   Ht 4' 11 (1.499 m)   Wt 90.7 kg   SpO2 96%   BMI 40.40 kg/m  Physical Exam Vitals and nursing note reviewed.   Gen: Mild conversational dyspnea noted Eyes: PERRL, EOMI HEENT: no oropharyngeal  swelling Neck: trachea midline Resp: Diminished throughout with scattered wheezes throughout all lung fields Card: RRR, no murmurs, rubs, or gallops Abd: nontender, nondistended Extremities: no calf tenderness, no edema Vascular: 2+ radial pulses bilaterally, 2+ DP pulses bilaterally Skin: no rashes Psyc: acting appropriately   ED Results / Procedures / Treatments   Labs (all labs ordered are listed, but only abnormal results are displayed) Labs Reviewed  BASIC METABOLIC PANEL - Abnormal; Notable for the following components:      Result Value   Chloride 94 (*)    CO2 37 (*)     Glucose, Bld 111 (*)    All other components within normal limits  BRAIN NATRIURETIC PEPTIDE - Abnormal; Notable for the following components:   B Natriuretic Peptide 323.7 (*)    All other components within normal limits  CBC WITH DIFFERENTIAL/PLATELET - Abnormal; Notable for the following components:   MCV 100.7 (*)    Platelets 144 (*)    Neutro Abs 9.2 (*)    Lymphs Abs 0.3 (*)    All other components within normal limits  RESP PANEL BY RT-PCR (RSV, FLU A&B, COVID)  RVPGX2  CBC WITH DIFFERENTIAL/PLATELET  TROPONIN I (HIGH SENSITIVITY)    EKG EKG Interpretation Date/Time:  Wednesday August 21 2023 18:51:57 EST Ventricular Rate:  109 PR Interval:  156 QRS Duration:  71 QT Interval:  294 QTC Calculation: 396 R Axis:   0  Text Interpretation: Sinus tachycardia Repol abnrm, severe global ischemia (LM/MVD) Confirmed by Ula Barter [phone]) on 08/21/2023 7:13:30 PM  Radiology DG Chest Port 1 View Result Date: 08/21/2023 CLINICAL DATA:  Shortness of breath EXAM: PORTABLE CHEST 1 VIEW COMPARISON:  03/20/2022 FINDINGS: Cardiac shadow is stable. Aortic calcifications are noted. Mild central vascular congestion is noted without significant edema. No focal infiltrate is seen. No bony abnormality is noted. IMPRESSION: Mild central vascular congestion without significant edema. Electronically Signed   By: Oneil Devonshire M.D.   On: 08/21/2023 19:03    Procedures Procedures    Medications Ordered in ED Medications  azithromycin  (ZITHROMAX ) 500 mg in sodium chloride  0.9 % 250 mL IVPB (0 mg Intravenous Stopped 08/21/23 1920)  methylPREDNISolone  sodium succinate (SOLU-MEDROL ) 125 mg/2 mL injection 125 mg (125 mg Intravenous Given 08/21/23 1802)  ipratropium-albuterol  (DUONEB) 0.5-2.5 (3) MG/3ML nebulizer solution 3 mL (3 mLs Nebulization Given 08/21/23 1804)  ipratropium-albuterol  (DUONEB) 0.5-2.5 (3) MG/3ML nebulizer solution 3 mL (3 mLs Nebulization Given 08/21/23 1804)  ipratropium-albuterol   (DUONEB) 0.5-2.5 (3) MG/3ML nebulizer solution 3 mL (3 mLs Nebulization Given 08/21/23 1803)  nitroGLYCERIN  (NITROGLYN) 2 % ointment 0.5 inch (0.5 inches Topical Given 08/21/23 1920)  aspirin  chewable tablet 324 mg (324 mg Oral Given 08/21/23 1919)  furosemide  (LASIX ) injection 40 mg (40 mg Intravenous Given 08/21/23 2136)    ED Course/ Medical Decision Making/ A&P                                 Medical Decision Making 89 year old female with past medical history of hypertension, CHF, hyperlipidemia, and COPD presents the emergency department today with cough and shortness of breath.  I will give the patient Solu-Medrol  here as well as azithromycin  in addition to DuoNebs for likely COPD exacerbation.  Will obtain basic labs here as well as an EKG, BNP, and chest x-ray to evaluate for underlying CHF, pulmonary edema, pulmonary infiltrates, or pneumothorax.  Also obtain a COVID and flu swab on the patient given  her respiratory symptoms.  I will reevaluate for ultimate disposition.  Was brought an EKG at around 7 PM after the patient started to develop some chest tightness.  This does have some findings concerning for ischemia.  There are no definitive ST elevations but it does appear to have some global ST depressions and slight elevations in aVR.  I did call and discussed this with Dr. Verlin.  When I went back to reassess the patient immediately after the EKG was performed she is denying any chest pain currently.  She states that she feels that her breathing is better after the nebulizer treatments.  He did recommend medical management of the patient is given aspirin  as well as Nitropaste.  This could be due to her episode of hypoxia earlier at her doctor's office.  The patient's initial troponin here is negative.  She is requiring more oxygen than her home baseline.  Calls placed to hospital service for admission.  Amount and/or Complexity of Data Reviewed Labs: ordered. Radiology:  ordered.  Risk OTC drugs. Prescription drug management.   CRITICAL CARE Performed by: Prentice JONELLE Medicus   Total critical care time: 45 minutes  Critical care time was exclusive of separately billable procedures and treating other patients.  Critical care was necessary to treat or prevent imminent or life-threatening deterioration.  Critical care was time spent personally by me on the following activities: development of treatment plan with patient and/or surrogate as well as nursing, discussions with consultants, evaluation of patient's response to treatment, examination of patient, obtaining history from patient or surrogate, ordering and performing treatments and interventions, ordering and review of laboratory studies, ordering and review of radiographic studies, pulse oximetry and re-evaluation of patient's condition.         Final Clinical Impression(s) / ED Diagnoses Final diagnoses:  COPD exacerbation (HCC)  Hypoxia  Abnormal EKG    Rx / DC Orders ED Discharge Orders     None         Medicus Prentice JONELLE, MD 08/21/23 2337

## 2023-08-21 NOTE — ED Triage Notes (Signed)
 Patient brought in by [facility] for shortness of breath. Patient ordered to wear 2 liters continuously but endorses not wearing oxygen. From doctors office for SOB and cough. Not currently taking albuterol , wheezing diminished in left. Duoneb with relief. 82 on room air initially, SpO2 on 72 on room air after coughing, 88 on 4 liters, on 6 liters at 92%. GCS 15. Hx of CHF.   EMS vitals BP 156/90 HR 96

## 2023-08-21 NOTE — ED Notes (Signed)
 Upon entry to room, patient states she had an episode of chest pain where she became anxious. Vital signs retaken and repeat EKG complete.

## 2023-08-22 ENCOUNTER — Inpatient Hospital Stay (HOSPITAL_COMMUNITY): Payer: Medicare Other

## 2023-08-22 ENCOUNTER — Encounter (HOSPITAL_COMMUNITY): Payer: Self-pay | Admitting: Internal Medicine

## 2023-08-22 DIAGNOSIS — I214 Non-ST elevation (NSTEMI) myocardial infarction: Secondary | ICD-10-CM | POA: Insufficient documentation

## 2023-08-22 DIAGNOSIS — Z7982 Long term (current) use of aspirin: Secondary | ICD-10-CM | POA: Diagnosis not present

## 2023-08-22 DIAGNOSIS — I509 Heart failure, unspecified: Secondary | ICD-10-CM

## 2023-08-22 DIAGNOSIS — Z880 Allergy status to penicillin: Secondary | ICD-10-CM | POA: Diagnosis not present

## 2023-08-22 DIAGNOSIS — I5033 Acute on chronic diastolic (congestive) heart failure: Secondary | ICD-10-CM | POA: Diagnosis present

## 2023-08-22 DIAGNOSIS — Z87891 Personal history of nicotine dependence: Secondary | ICD-10-CM | POA: Diagnosis not present

## 2023-08-22 DIAGNOSIS — R600 Localized edema: Secondary | ICD-10-CM | POA: Insufficient documentation

## 2023-08-22 DIAGNOSIS — Z888 Allergy status to other drugs, medicaments and biological substances status: Secondary | ICD-10-CM | POA: Diagnosis not present

## 2023-08-22 DIAGNOSIS — M7989 Other specified soft tissue disorders: Secondary | ICD-10-CM | POA: Diagnosis not present

## 2023-08-22 DIAGNOSIS — J441 Chronic obstructive pulmonary disease with (acute) exacerbation: Secondary | ICD-10-CM | POA: Insufficient documentation

## 2023-08-22 DIAGNOSIS — J9601 Acute respiratory failure with hypoxia: Secondary | ICD-10-CM | POA: Diagnosis not present

## 2023-08-22 DIAGNOSIS — I110 Hypertensive heart disease with heart failure: Secondary | ICD-10-CM | POA: Diagnosis present

## 2023-08-22 DIAGNOSIS — Z8673 Personal history of transient ischemic attack (TIA), and cerebral infarction without residual deficits: Secondary | ICD-10-CM | POA: Diagnosis not present

## 2023-08-22 DIAGNOSIS — Z79899 Other long term (current) drug therapy: Secondary | ICD-10-CM | POA: Diagnosis not present

## 2023-08-22 DIAGNOSIS — J9622 Acute and chronic respiratory failure with hypercapnia: Secondary | ICD-10-CM | POA: Diagnosis present

## 2023-08-22 DIAGNOSIS — Z1152 Encounter for screening for COVID-19: Secondary | ICD-10-CM | POA: Diagnosis not present

## 2023-08-22 DIAGNOSIS — R0602 Shortness of breath: Secondary | ICD-10-CM | POA: Diagnosis present

## 2023-08-22 DIAGNOSIS — G9341 Metabolic encephalopathy: Secondary | ICD-10-CM | POA: Diagnosis present

## 2023-08-22 DIAGNOSIS — I5032 Chronic diastolic (congestive) heart failure: Secondary | ICD-10-CM

## 2023-08-22 DIAGNOSIS — J810 Acute pulmonary edema: Secondary | ICD-10-CM

## 2023-08-22 DIAGNOSIS — E8729 Other acidosis: Secondary | ICD-10-CM | POA: Diagnosis present

## 2023-08-22 DIAGNOSIS — Z9981 Dependence on supplemental oxygen: Secondary | ICD-10-CM | POA: Diagnosis not present

## 2023-08-22 DIAGNOSIS — J9621 Acute and chronic respiratory failure with hypoxia: Secondary | ICD-10-CM

## 2023-08-22 DIAGNOSIS — Z66 Do not resuscitate: Secondary | ICD-10-CM | POA: Diagnosis present

## 2023-08-22 DIAGNOSIS — R7989 Other specified abnormal findings of blood chemistry: Secondary | ICD-10-CM | POA: Diagnosis present

## 2023-08-22 DIAGNOSIS — Z6841 Body Mass Index (BMI) 40.0 and over, adult: Secondary | ICD-10-CM | POA: Diagnosis not present

## 2023-08-22 DIAGNOSIS — R9431 Abnormal electrocardiogram [ECG] [EKG]: Secondary | ICD-10-CM | POA: Diagnosis not present

## 2023-08-22 DIAGNOSIS — D7589 Other specified diseases of blood and blood-forming organs: Secondary | ICD-10-CM | POA: Insufficient documentation

## 2023-08-22 DIAGNOSIS — E7800 Pure hypercholesterolemia, unspecified: Secondary | ICD-10-CM | POA: Diagnosis present

## 2023-08-22 DIAGNOSIS — Z8249 Family history of ischemic heart disease and other diseases of the circulatory system: Secondary | ICD-10-CM | POA: Diagnosis not present

## 2023-08-22 DIAGNOSIS — I6523 Occlusion and stenosis of bilateral carotid arteries: Secondary | ICD-10-CM | POA: Diagnosis present

## 2023-08-22 DIAGNOSIS — Z881 Allergy status to other antibiotic agents status: Secondary | ICD-10-CM | POA: Diagnosis not present

## 2023-08-22 LAB — RESPIRATORY PANEL BY PCR

## 2023-08-22 LAB — BASIC METABOLIC PANEL
Anion gap: 8 (ref 5–15)
BUN: 21 mg/dL (ref 8–23)
CO2: 41 mmol/L — ABNORMAL HIGH (ref 22–32)
Calcium: 9 mg/dL (ref 8.9–10.3)
Chloride: 91 mmol/L — ABNORMAL LOW (ref 98–111)
Creatinine, Ser: 1.15 mg/dL — ABNORMAL HIGH (ref 0.44–1.00)
GFR, Estimated: 46 mL/min — ABNORMAL LOW (ref >60)
Glucose, Bld: 191 mg/dL — ABNORMAL HIGH (ref 70–99)
Potassium: 5 mmol/L (ref 3.5–5.1)
Sodium: 140 mmol/L (ref 135–145)

## 2023-08-22 LAB — LIPID PANEL
Cholesterol: 179 mg/dL (ref 0–200)
Cholesterol: 161 mg/dL (ref 0–200)
HDL: 67 mg/dL (ref >40)
HDL: 56 mg/dL (ref >40)
LDL Cholesterol: 102 mg/dL — ABNORMAL HIGH (ref 0–99)
LDL Cholesterol: 96 mg/dL (ref 0–99)
Total CHOL/HDL Ratio: 2.7 {ratio}
Total CHOL/HDL Ratio: 2.9 {ratio}
Triglycerides: 50 mg/dL (ref <150)
Triglycerides: 46 mg/dL (ref <150)
VLDL: 10 mg/dL (ref 0–40)
VLDL: 9 mg/dL (ref 0–40)

## 2023-08-22 LAB — ECHOCARDIOGRAM COMPLETE
Area-P 1/2: 4.41 cm2
Height: 59 in
S' Lateral: 3.7 cm
Weight: 3200 [oz_av]

## 2023-08-22 LAB — COMPREHENSIVE METABOLIC PANEL
ALT: 22 U/L (ref 0–44)
AST: 108 U/L — ABNORMAL HIGH (ref 15–41)
Albumin: 3.5 g/dL (ref 3.5–5.0)
Alkaline Phosphatase: 52 U/L (ref 38–126)
Anion gap: 15 (ref 5–15)
BUN: 22 mg/dL (ref 8–23)
CO2: 31 mmol/L (ref 22–32)
Calcium: 8.8 mg/dL — ABNORMAL LOW (ref 8.9–10.3)
Chloride: 93 mmol/L — ABNORMAL LOW (ref 98–111)
Creatinine, Ser: 0.93 mg/dL (ref 0.44–1.00)
GFR, Estimated: 59 mL/min — ABNORMAL LOW (ref >60)
Glucose, Bld: 105 mg/dL — ABNORMAL HIGH (ref 70–99)
Potassium: 5.4 mmol/L — ABNORMAL HIGH (ref 3.5–5.1)
Sodium: 139 mmol/L (ref 135–145)
Total Bilirubin: 1.1 mg/dL (ref 0.0–1.2)
Total Protein: 6.6 g/dL (ref 6.5–8.1)

## 2023-08-22 LAB — CBC
HCT: 49.2 % — ABNORMAL HIGH (ref 36.0–46.0)
Hemoglobin: 14.8 g/dL (ref 12.0–15.0)
MCH: 30.5 pg (ref 26.0–34.0)
MCHC: 30.1 g/dL (ref 30.0–36.0)
MCV: 101.4 fL — ABNORMAL HIGH (ref 80.0–100.0)
Platelets: 169 10*3/uL (ref 150–400)
RBC: 4.85 MIL/uL (ref 3.87–5.11)
RDW: 14.8 % (ref 11.5–15.5)
WBC: 8.8 10*3/uL (ref 4.0–10.5)
nRBC: 0 % (ref 0.0–0.2)

## 2023-08-22 LAB — D-DIMER, QUANTITATIVE: D-Dimer, Quant: 0.76 ug{FEU}/mL — ABNORMAL HIGH (ref 0.00–0.50)

## 2023-08-22 LAB — VITAMIN B12: Vitamin B-12: 724 pg/mL (ref 180–914)

## 2023-08-22 LAB — HEMOGLOBIN A1C
Hgb A1c MFr Bld: 6.1 % — ABNORMAL HIGH (ref 4.8–5.6)
Mean Plasma Glucose: 128.37 mg/dL

## 2023-08-22 LAB — TROPONIN I (HIGH SENSITIVITY): Troponin I (High Sensitivity): 155 ng/L (ref <18)

## 2023-08-22 LAB — TSH: TSH: 0.984 u[IU]/mL (ref 0.350–4.500)

## 2023-08-22 LAB — HEPARIN LEVEL (UNFRACTIONATED)
Heparin Unfractionated: 0.52 [IU]/mL (ref 0.30–0.70)
Heparin Unfractionated: 0.59 [IU]/mL (ref 0.30–0.70)

## 2023-08-22 LAB — FOLATE: Folate: 12.6 ng/mL (ref >5.9)

## 2023-08-22 LAB — GLUCOSE, CAPILLARY: Glucose-Capillary: 106 mg/dL — ABNORMAL HIGH (ref 70–99)

## 2023-08-22 LAB — HIV ANTIBODY (ROUTINE TESTING W REFLEX): HIV Screen 4th Generation wRfx: NONREACTIVE

## 2023-08-22 LAB — PROCALCITONIN: Procalcitonin: <0.1 ng/mL

## 2023-08-22 MED ORDER — PREDNISONE 20 MG PO TABS: 40.0000 mg | ORAL_TABLET | Freq: Every day | ORAL | Status: DC

## 2023-08-22 MED ORDER — IPRATROPIUM-ALBUTEROL 0.5-2.5 (3) MG/3ML IN SOLN
3.0000 mL | Freq: Two times a day (BID) | RESPIRATORY_TRACT | Status: DC
  Administered 2023-08-23 – 2023-08-25 (×6): 3 mL via RESPIRATORY_TRACT
  Filled 2023-08-22 (×7): qty 3

## 2023-08-22 MED ORDER — AMLODIPINE BESYLATE 5 MG PO TABS: 2.5000 mg | ORAL_TABLET | Freq: Every day | ORAL | Status: DC

## 2023-08-22 MED ORDER — VITAMIN D 25 MCG (1000 UNIT) PO TABS
1000.0000 [IU] | ORAL_TABLET | Freq: Every day | ORAL | Status: DC
  Administered 2023-08-23 – 2023-08-30 (×8): 1000 [IU] via ORAL
  Filled 2023-08-22 (×8): qty 1

## 2023-08-22 MED ORDER — SODIUM CHLORIDE 0.9 % IV SOLN
1.0000 g | Freq: Every day | INTRAVENOUS | Status: DC
  Administered 2023-08-22 (×2): 1 g via INTRAVENOUS
  Filled 2023-08-22 (×2): qty 10

## 2023-08-22 MED ORDER — ASPIRIN 81 MG PO TBEC: 81.0000 mg | DELAYED_RELEASE_TABLET | Freq: Every day | ORAL | Status: DC

## 2023-08-22 MED ORDER — FUROSEMIDE 10 MG/ML IJ SOLN
40.0000 mg | Freq: Once | INTRAMUSCULAR | Status: AC
  Administered 2023-08-22: 40 mg via INTRAVENOUS
  Filled 2023-08-22: qty 4

## 2023-08-22 MED ORDER — IPRATROPIUM-ALBUTEROL 0.5-2.5 (3) MG/3ML IN SOLN: 3.0000 mL | RESPIRATORY_TRACT | Status: DC | PRN

## 2023-08-22 MED ORDER — PRAVASTATIN SODIUM 10 MG PO TABS: 10.0000 mg | ORAL_TABLET | Freq: Every day | ORAL | Status: DC

## 2023-08-22 MED ORDER — DOCUSATE SODIUM 100 MG PO CAPS
100.0000 mg | ORAL_CAPSULE | Freq: Every day | ORAL | Status: DC
  Administered 2023-08-24 – 2023-08-30 (×7): 100 mg via ORAL
  Filled 2023-08-22 (×8): qty 1

## 2023-08-22 MED ORDER — FUROSEMIDE 10 MG/ML IJ SOLN
80.0000 mg | Freq: Two times a day (BID) | INTRAMUSCULAR | Status: DC
  Administered 2023-08-22 – 2023-08-24 (×4): 80 mg via INTRAVENOUS
  Filled 2023-08-22 (×4): qty 8

## 2023-08-22 MED ORDER — IPRATROPIUM-ALBUTEROL 0.5-2.5 (3) MG/3ML IN SOLN
3.0000 mL | Freq: Once | RESPIRATORY_TRACT | Status: AC
  Administered 2023-08-22: 3 mL via RESPIRATORY_TRACT

## 2023-08-22 MED ORDER — METOPROLOL SUCCINATE ER 25 MG PO TB24: 25.0000 mg | ORAL_TABLET | Freq: Every day | ORAL | Status: DC

## 2023-08-22 MED ORDER — METHYLPREDNISOLONE SODIUM SUCC 125 MG IJ SOLR
80.0000 mg | Freq: Every day | INTRAMUSCULAR | Status: AC
  Administered 2023-08-22: 80 mg via INTRAVENOUS
  Filled 2023-08-22: qty 2

## 2023-08-22 MED ORDER — BUDESONIDE 0.25 MG/2ML IN SUSP
0.2500 mg | Freq: Two times a day (BID) | RESPIRATORY_TRACT | Status: DC
Start: 2023-08-22 — End: 2023-08-30
  Administered 2023-08-22 – 2023-08-30 (×15): 0.25 mg via RESPIRATORY_TRACT
  Filled 2023-08-22 (×16): qty 2

## 2023-08-22 MED ORDER — HEPARIN BOLUS VIA INFUSION
3000.0000 [IU] | Freq: Once | INTRAVENOUS | Status: AC
  Administered 2023-08-22: 3000 [IU] via INTRAVENOUS
  Filled 2023-08-22: qty 3000

## 2023-08-22 MED ORDER — ENOXAPARIN SODIUM 40 MG/0.4ML IJ SOSY: 40.0000 mg | PREFILLED_SYRINGE | Freq: Every day | INTRAMUSCULAR | Status: DC

## 2023-08-22 MED ORDER — IPRATROPIUM-ALBUTEROL 0.5-2.5 (3) MG/3ML IN SOLN
3.0000 mL | RESPIRATORY_TRACT | Status: DC
  Administered 2023-08-22 (×3): 3 mL via RESPIRATORY_TRACT
  Filled 2023-08-22 (×3): qty 3

## 2023-08-22 MED ORDER — ASPIRIN 81 MG PO TBEC
81.0000 mg | DELAYED_RELEASE_TABLET | Freq: Every day | ORAL | Status: DC
  Administered 2023-08-23 – 2023-08-30 (×8): 81 mg via ORAL
  Filled 2023-08-22 (×8): qty 1

## 2023-08-22 MED ORDER — METHYLPREDNISOLONE SODIUM SUCC 40 MG IJ SOLR
40.0000 mg | Freq: Two times a day (BID) | INTRAMUSCULAR | Status: DC
  Administered 2023-08-23: 40 mg via INTRAVENOUS
  Filled 2023-08-22: qty 1

## 2023-08-22 MED ORDER — HEPARIN (PORCINE) 25000 UT/250ML-% IV SOLN
950.0000 [IU]/h | INTRAVENOUS | Status: DC
  Administered 2023-08-22: 950 [IU]/h via INTRAVENOUS
  Filled 2023-08-22 (×2): qty 250

## 2023-08-22 MED ORDER — FUROSEMIDE 20 MG PO TABS: 20.0000 mg | ORAL_TABLET | Freq: Every day | ORAL | Status: DC

## 2023-08-22 NOTE — Progress Notes (Signed)
 PT Cancellation Note  Patient Details Name: [name] MRN: [id number] DOB: 15-Oct-1932   Cancelled Treatment:    Reason Eval/Treat Not Completed: Medical issues which prohibited therapy (Patient currently requiring Bipap for respiratory issues. PT will continue with attempts when appropriate.)  Randine Essex, PT, MPT  Randine LULLA Essex 08/22/2023, 11:19 AM

## 2023-08-22 NOTE — ED Notes (Signed)
 Provider notified of ABG values on patient:  pH 7.141 PCO2 >110 PO2 179

## 2023-08-22 NOTE — Progress Notes (Signed)
 PHARMACY - ANTICOAGULATION CONSULT NOTE  Pharmacy Consult for Heparin  Indication: chest pain/ACS  Allergies  Allergen Reactions   Atorvastatin Other (See Comments)    Myalgias   Ceftin [Cefuroxime] Diarrhea   Cefuroxime Axetil Diarrhea   Clindamycin Hcl Itching   Clindamycin/Lincomycin Itching   Dapagliflozin      Other Reaction(s): numbness/tingling   Moxifloxacin Itching   Simvastatin  Other (See Comments)    myalgias   Doxycycline Hyclate Diarrhea and Rash   Penicillins Rash    Patient Measurements: Height: 4' 11 (149.9 cm) Weight: 90.7 kg (200 lb) IBW/kg (Calculated) : 43.2 Heparin  Dosing Weight: 65 kg  Vital Signs: Temp: 97.3 F (36.3 C) (01/09 0300) Temp Source: Oral (01/09 0300) BP: 116/47 (01/09 0115) Pulse Rate: 99 (01/09 0115)  Labs: Recent Labs    08/21/23 1727 08/21/23 2245  HGB  --  13.8  HCT  --  45.8  PLT  --  144*  CREATININE 0.72  --   TROPONINIHS 17 155*    Estimated Creatinine Clearance: 47.7 mL/min (by C-G formula based on SCr of 0.72 mg/dL).   Medical History: Past Medical History:  Diagnosis Date   Acute back pain with sciatica, unspecified laterality    Allergic rhinitis    BMI 40.0-44.9, adult (HCC)    Carotid artery disease (HCC) 05/11/2022   Carotid US  02/2022: Bilateral ICA 40-59   Cerebrovascular disease    Chronic diastolic (congestive) heart failure (HCC)    Colon polyp    COPD (chronic obstructive pulmonary disease) (HCC)    MILD   Disorder of bone    Estrogen deficiency    Excessive sleepiness    Hyperlipidemia    Hypertension    Incontinence of urine    Irregular heartbeat    Low back pain    Morbid obesity (HCC)    OA (osteoarthritis)    OF THE KNEES   OAB (overactive bladder)    Osteopenia    Osteopenia    Primary osteoarthritis of both knees    Pure hypercholesterolemia    Shingles    Shingles    Stroke (HCC)    Thyroid  cyst    Thyroid  cyst    Ulnar neuropathy of both upper extremities    Vertigo      Medications:  No current facility-administered medications on file prior to encounter.   Current Outpatient Medications on File Prior to Encounter  Medication Sig Dispense Refill   albuterol  (PROVENTIL ) (2.5 MG/3ML) 0.083% nebulizer solution Take 3 mLs (2.5 mg total) by nebulization every 4 (four) hours as needed for wheezing or shortness of breath. 75 mL 1   amLODipine  (NORVASC ) 2.5 MG tablet Take 2.5 mg by mouth daily.     aspirin  EC 81 MG EC tablet Take 1 tablet (81 mg total) by mouth daily. Swallow whole. 30 tablet 11   budesonide  (PULMICORT ) 0.25 MG/2ML nebulizer solution Take 2 mLs (0.25 mg total) by nebulization in the morning and at bedtime. 60 mL 12   Cholecalciferol  (VITAMIN D  PO) Take 1,000 Units by mouth daily.     docusate sodium  (COLACE) 100 MG capsule Take 100 mg by mouth daily.     furosemide  (LASIX ) 20 MG tablet Take 1 tablet (20 mg total) by mouth daily. 30 tablet    Glycerin-Polysorbate 80 (REFRESH DRY EYE THERAPY OP) Apply 2 drops to eye as needed (dry eyes).     ipratropium-albuterol  (DUONEB) 0.5-2.5 (3) MG/3ML SOLN Take 3 mLs by nebulization 2 (two) times daily. 160 mL 1   Metoprolol   Succinate 25 MG CS24 Take 25 mg by mouth daily.     OXYGEN Inhale into the lungs as directed. 2L     pravastatin  (PRAVACHOL ) 10 MG tablet Take 10 mg by mouth at bedtime.     [DISCONTINUED] dapagliflozin  propanediol (FARXIGA ) 10 MG TABS tablet Take 1 tablet (10 mg total) by mouth daily before breakfast. (Patient not taking: Reported on 05/02/2023) 90 tablet 3     Assessment: 89 y.o. female with SOB and elevated troponin, possible ACS, for heparin   Goal of Therapy:  Heparin  level 0.3-0.7 units/ml Monitor platelets by anticoagulation protocol: Yes   Plan:  Heparin  3000 units IV bolus, then start heparin  950 units/hr Check heparin  level in 8 hours.   Dail Cordella Misty 08/22/2023,4:43 AM

## 2023-08-22 NOTE — H&P (Signed)
 History and Physical    [name]:[id number] DOB: [birth year] DOA: 08/21/2023  Patient coming from: Home.  Chief Complaint: Shortness of breath.  HPI: [name] is a 89 y.o. female with history of COPD on 2 L oxygen, HFpEF, hypertension, carotid stenosis presents to the ER because of increasing shortness of breath.  Patient states she has not been using her nebulizers as advised and has been off it for almost a month now.  Patient is a poor historian.  Patient states she has been getting gradually more short of breath last few days and had gone to her PCPs office and over there patient was found to be short of breath and hypoxic was referred to the ER.  Patient specifically denies any chest pain but does have some productive cough.  Patient also noticed increasing bilateral lower extremity edema with some erythema.  Denies any nausea vomiting abdominal pain.  ED Course: In the ER patient was hypoxic requiring 6 L oxygen chest x-ray unremarkable.  EKG shows diffuse ST depression BNP was 323 troponin 17 repeat 1 was 155.  Platelets 144.  Creatinine 0.7.  Patient was started on nebulizer steroids and antibiotics for COPD one dose of Lasix  was given.  Given the elevated troponin and abnormal EKG concerning for non-ST elevation MI I consulted cardiology started on heparin  and aspirin .  Review of Systems: As per HPI, rest all negative.   Past Medical History:  Diagnosis Date   Acute back pain with sciatica, unspecified laterality    Allergic rhinitis    BMI 40.0-44.9, adult (HCC)    Carotid artery disease (HCC) 05/11/2022   Carotid US  02/2022: Bilateral ICA 40-59   Cerebrovascular disease    Chronic diastolic (congestive) heart failure (HCC)    Colon polyp    COPD (chronic obstructive pulmonary disease) (HCC)    MILD   Disorder of bone    Estrogen deficiency    Excessive sleepiness    Hyperlipidemia    Hypertension    Incontinence of urine    Irregular heartbeat    Low back pain     Morbid obesity (HCC)    OA (osteoarthritis)    OF THE KNEES   OAB (overactive bladder)    Osteopenia    Osteopenia    Primary osteoarthritis of both knees    Pure hypercholesterolemia    Shingles    Shingles    Stroke (HCC)    Thyroid  cyst    Thyroid  cyst    Ulnar neuropathy of both upper extremities    Vertigo     Past Surgical History:  Procedure Laterality Date   BREAST EXCISIONAL BIOPSY Right    benign     reports that she has quit smoking. Her smoking use included cigarettes. She has never used smokeless tobacco. She reports that she does not currently use alcohol. She reports that she does not use drugs.  Allergies  Allergen Reactions   Atorvastatin Other (See Comments)    Myalgias   Ceftin [Cefuroxime] Diarrhea   Cefuroxime Axetil Diarrhea   Clindamycin Hcl Itching   Clindamycin/Lincomycin Itching   Dapagliflozin      Other Reaction(s): numbness/tingling   Moxifloxacin Itching   Simvastatin  Other (See Comments)    myalgias   Doxycycline Hyclate Diarrhea and Rash   Penicillins Rash    Family History  Problem Relation Age of Onset   CAD Father    Breast cancer Neg Hx     Prior to Admission medications   Medication Sig  Start Date End Date Taking? Authorizing Provider  albuterol  (PROVENTIL ) (2.5 MG/3ML) 0.083% nebulizer solution Take 3 mLs (2.5 mg total) by nebulization every 4 (four) hours as needed for wheezing or shortness of breath. 05/02/23  Yes Hope Almarie ORN, NP  amLODipine  (NORVASC ) 2.5 MG tablet Take 2.5 mg by mouth daily. 08/27/21  Yes [provider]  aspirin  EC 81 MG EC tablet Take 1 tablet (81 mg total) by mouth daily. Swallow whole. 10/27/20  Yes Ghimire, Donalda HERO, MD  budesonide  (PULMICORT ) 0.25 MG/2ML nebulizer solution Take 2 mLs (0.25 mg total) by nebulization in the morning and at bedtime. 05/02/23  Yes Hope Almarie ORN, NP  Cholecalciferol  (VITAMIN D  PO) Take 1,000 Units by mouth daily.   Yes [provider]   docusate sodium  (COLACE) 100 MG capsule Take 100 mg by mouth daily.   Yes [provider]  furosemide  (LASIX ) 20 MG tablet Take 1 tablet (20 mg total) by mouth daily. 03/24/22  Yes Vann, Jessica U, DO  Glycerin-Polysorbate 80 (REFRESH DRY EYE THERAPY OP) Apply 2 drops to eye as needed (dry eyes).   Yes [provider]  ipratropium-albuterol  (DUONEB) 0.5-2.5 (3) MG/3ML SOLN Take 3 mLs by nebulization 2 (two) times daily. 05/02/23  Yes Hope Almarie ORN, NP  Metoprolol  Succinate 25 MG CS24 Take 25 mg by mouth daily.   Yes [provider]  OXYGEN Inhale into the lungs as directed. 2L   Yes [provider]  pravastatin  (PRAVACHOL ) 10 MG tablet Take 10 mg by mouth at bedtime. 06/12/22  Yes [provider]    Physical Exam: Constitutional: Moderately built and nourished. Vitals:   08/21/23 2000 08/21/23 2015 08/21/23 2030 08/22/23 0115  BP: 114/66 120/76 137/85 (!) 116/47  Pulse: (!) 104 (!) 105 (!) 103 99  Resp: 17 17 (!) 25 (!) 25  Temp:      TempSrc:      SpO2: 95% 96% 96% 93%  Weight:      Height:       Eyes: Anicteric no pallor. ENMT: No discharge from the ears eyes nose or mouth. Neck: No mass felt.  No JVD appreciated. Respiratory: Bilateral air entry is tight. Cardiovascular: S1-S2 heard. Abdomen: Soft nontender bowel sounds present. Musculoskeletal: Bilateral lower extremity edema present.  Mild erythema. Skin: Mild eczema of the both lower extremities. Neurologic: Alert awake oriented time place and person.  Moves all extremities. Psychiatric: Appears normal.  Normal affect.   Labs on Admission: I have personally reviewed following labs and imaging studies  CBC: Recent Labs  Lab 08/21/23 2245  WBC 9.7  NEUTROABS 9.2*  HGB 13.8  HCT 45.8  MCV 100.7*  PLT 144*   Basic Metabolic Panel: Recent Labs  Lab 08/21/23 1727  NA 141  K 4.4  CL 94*  CO2 37*  GLUCOSE 111*  BUN 22  CREATININE 0.72  CALCIUM  9.2    GFR: Estimated Creatinine Clearance: 47.7 mL/min (by C-G formula based on SCr of 0.72 mg/dL). Liver Function Tests: No results for input(s): AST, ALT, ALKPHOS, BILITOT, PROT, ALBUMIN in the last 168 hours. No results for input(s): LIPASE, AMYLASE in the last 168 hours. No results for input(s): AMMONIA in the last 168 hours. Coagulation Profile: No results for input(s): INR, PROTIME in the last 168 hours. Cardiac Enzymes: No results for input(s): CKTOTAL, CKMB, CKMBINDEX, TROPONINI in the last 168 hours. BNP (last 3 results) No results for input(s): PROBNP in the last 8760 hours. HbA1C: No results for input(s): HGBA1C in  the last 72 hours. CBG: No results for input(s): GLUCAP in the last 168 hours. Lipid Profile: No results for input(s): CHOL, HDL, LDLCALC, TRIG, CHOLHDL, LDLDIRECT in the last 72 hours. Thyroid  Function Tests: No results for input(s): TSH, T4TOTAL, FREET4, T3FREE, THYROIDAB in the last 72 hours. Anemia Panel: No results for input(s): VITAMINB12, FOLATE, FERRITIN, TIBC, IRON, RETICCTPCT in the last 72 hours. Urine analysis:    Component Value Date/Time   COLORURINE STRAW (A) 10/21/2020 1229   APPEARANCEUR CLEAR 10/21/2020 1229   LABSPEC 1.009 10/21/2020 1229   PHURINE 5.0 10/21/2020 1229   GLUCOSEU NEGATIVE 10/21/2020 1229   HGBUR NEGATIVE 10/21/2020 1229   BILIRUBINUR NEGATIVE 10/21/2020 1229   KETONESUR NEGATIVE 10/21/2020 1229   PROTEINUR NEGATIVE 10/21/2020 1229   UROBILINOGEN 0.2 01/27/2009 1018   NITRITE NEGATIVE 10/21/2020 1229   LEUKOCYTESUR NEGATIVE 10/21/2020 1229   Sepsis Labs: @LABRCNTIP (procalcitonin:4,lacticidven:4) ) Recent Results (from the past 240 hours)  Resp panel by RT-PCR (RSV, Flu A&B, Covid) Anterior Nasal Swab     Status: None   Collection Time: 08/21/23  5:28 PM   Specimen: Anterior Nasal Swab  Result Value Ref Range Status   SARS Coronavirus 2 by RT PCR  NEGATIVE NEGATIVE Final   Influenza A by PCR NEGATIVE NEGATIVE Final   Influenza B by PCR NEGATIVE NEGATIVE Final    Comment: (NOTE) The Xpert Xpress SARS-CoV-2/FLU/RSV plus assay is intended as an aid in the diagnosis of influenza from Nasopharyngeal swab specimens and should not be used as a sole basis for treatment. Nasal washings and aspirates are unacceptable for Xpert Xpress SARS-CoV-2/FLU/RSV testing.  Fact Sheet for Patients: bloggercourse.com  Fact Sheet for Healthcare Providers: seriousbroker.it  This test is not yet approved or cleared by the [place]  FDA and has been authorized for detection and/or diagnosis of SARS-CoV-2 by FDA under an Emergency Use Authorization (EUA). This EUA will remain in effect (meaning this test can be used) for the duration of the COVID-19 declaration under Section 564(b)(1) of the Act, 21 U.S.C. section 360bbb-3(b)(1), unless the authorization is terminated or revoked.     Resp Syncytial Virus by PCR NEGATIVE NEGATIVE Final    Comment: (NOTE) Fact Sheet for Patients: bloggercourse.com  Fact Sheet for Healthcare Providers: seriousbroker.it  This test is not yet approved or cleared by the [place]  FDA and has been authorized for detection and/or diagnosis of SARS-CoV-2 by FDA under an Emergency Use Authorization (EUA). This EUA will remain in effect (meaning this test can be used) for the duration of the COVID-19 declaration under Section 564(b)(1) of the Act, 21 U.S.C. section 360bbb-3(b)(1), unless the authorization is terminated or revoked.  Performed at [facility], [address], [place], [place] [address]      Radiological Exams on Admission: DG Chest Port 1 View Result Date: 08/21/2023 CLINICAL DATA:  Shortness of breath EXAM: PORTABLE CHEST 1 VIEW COMPARISON:  03/20/2022 FINDINGS: Cardiac shadow is stable.  Aortic calcifications are noted. Mild central vascular congestion is noted without significant edema. No focal infiltrate is seen. No bony abnormality is noted. IMPRESSION: Mild central vascular congestion without significant edema. Electronically Signed   By: Oneil Devonshire M.D.   On: 08/21/2023 19:03    EKG: Independently reviewed.  No sinus rhythm with diffuse ST depressions.  Assessment/Plan Principal Problem:   Acute respiratory failure with hypoxia (HCC) Active Problems:   Essential hypertension   Tremors of nervous system   (HFpEF) heart failure with preserved ejection fraction (HCC)   Chronic  respiratory failure with hypoxia (HCC)   COPD with acute exacerbation (HCC)   Abnormal EKG    Acute respiratory failure with hypoxia presently on high flow oxygen probably a combination of CHF and COPD.  Lasix  40 mg IV was given.  Will keep patient on scheduled and as needed DuoNebs Pulmicort  IV steroids antibiotics for now.  Check 2D echo.  Check D-dimer.  ABG is pending.  Patient is refusing BiPAP. Non-ST elevation MI -    consulted cardiology.  Plan is to start patient on heparin  infusion, beta-blockers, statins and aspirin  keep patient n.p.o. in anticipation of possible cardiac cath check 2D echo.  Further recommendation per cardiology. History of hypertension on amlodipine  and beta-blockers. History of HFpEF last 2D echo done was in August 2023 showed EF of 60 to 65%.  See #1. Macrocytosis we will check B12 levels. History of carotid stenosis take statins. Bilateral lower extremity edema and erythema could be from venous stasis.  Dopplers are pending.  Could also be from CHF and possible cellulitis.  Already on antibiotics.  Given that patient has acute respiratory failure with non-ST elevation MI will need close monitoring in inpatient status.   DVT prophylaxis: Heparin  infusion. Code Status: DNR confirmed with patient. Family Communication: Discussed with patient's sister Ms. Lang  who lives in [place] . Disposition Plan: Progressive care. Consults called: Cardiology. Admission status: Inpatient.

## 2023-08-22 NOTE — Progress Notes (Signed)
 ANTICOAGULATION CONSULT NOTE - Follow-up Note  Pharmacy Consult for Heparin  Indication: chest pain/ACS  Allergies  Allergen Reactions   Atorvastatin Other (See Comments)    Myalgias   Ceftin [Cefuroxime] Diarrhea   Cefuroxime Axetil Diarrhea   Clindamycin Hcl Itching   Clindamycin/Lincomycin Itching   Dapagliflozin      Other Reaction(s): numbness/tingling   Moxifloxacin Itching   Simvastatin  Other (See Comments)    myalgias   Doxycycline Hyclate Diarrhea and Rash   Penicillins Rash    Patient Measurements: Height: 4' 11 (149.9 cm) Weight: 90.7 kg (200 lb) IBW/kg (Calculated) : 43.2 Heparin  Dosing Weight: 65 kg  Vital Signs: Temp: 97.7 F (36.5 C) (01/09 1125) Temp Source: Oral (01/09 1716) BP: 118/93 (01/09 1716) Pulse Rate: 97 (01/09 1600)  Labs: Recent Labs    08/21/23 1727 08/21/23 2245 08/22/23 0500 08/22/23 1603  HGB  --  13.8 14.8  --   HCT  --  45.8 49.2*  --   PLT  --  144* 169  --   HEPARINUNFRC  --   --   --  0.52  CREATININE 0.72  --   --   --   TROPONINIHS 17 155*  --   --     Estimated Creatinine Clearance: 47.7 mL/min (by C-G formula based on SCr of 0.72 mg/dL).   Medical History: Past Medical History:  Diagnosis Date   Acute back pain with sciatica, unspecified laterality    Allergic rhinitis    BMI 40.0-44.9, adult (HCC)    Carotid artery disease (HCC) 05/11/2022   Carotid US  02/2022: Bilateral ICA 40-59   Cerebrovascular disease    Chronic diastolic (congestive) heart failure (HCC)    Colon polyp    COPD (chronic obstructive pulmonary disease) (HCC)    MILD   Disorder of bone    Estrogen deficiency    Excessive sleepiness    Hyperlipidemia    Hypertension    Incontinence of urine    Irregular heartbeat    Low back pain    Morbid obesity (HCC)    OA (osteoarthritis)    OF THE KNEES   OAB (overactive bladder)    Osteopenia    Osteopenia    Primary osteoarthritis of both knees    Pure hypercholesterolemia    Shingles     Shingles    Stroke (HCC)    Thyroid  cyst    Thyroid  cyst    Ulnar neuropathy of both upper extremities    Vertigo     Medications:  Medications Prior to Admission  Medication Sig Dispense Refill Last Dose/Taking   albuterol  (PROVENTIL ) (2.5 MG/3ML) 0.083% nebulizer solution Take 3 mLs (2.5 mg total) by nebulization every 4 (four) hours as needed for wheezing or shortness of breath. 75 mL 1 Past Month   amLODipine  (NORVASC ) 2.5 MG tablet Take 2.5 mg by mouth daily.   08/21/2023 Morning   aspirin  EC 81 MG EC tablet Take 1 tablet (81 mg total) by mouth daily. Swallow whole. 30 tablet 11 08/21/2023 Morning   budesonide  (PULMICORT ) 0.25 MG/2ML nebulizer solution Take 2 mLs (0.25 mg total) by nebulization in the morning and at bedtime. 60 mL 12 Past Week   Cholecalciferol  (VITAMIN D  PO) Take 1,000 Units by mouth daily.   08/21/2023 Morning   docusate sodium  (COLACE) 100 MG capsule Take 100 mg by mouth daily.   08/21/2023 Morning   furosemide  (LASIX ) 20 MG tablet Take 1 tablet (20 mg total) by mouth daily. 30 tablet  08/21/2023 Morning  Glycerin-Polysorbate 80 (REFRESH DRY EYE THERAPY OP) Apply 2 drops to eye as needed (dry eyes).   Past Month   ipratropium-albuterol  (DUONEB) 0.5-2.5 (3) MG/3ML SOLN Take 3 mLs by nebulization 2 (two) times daily. 160 mL 1 Past Month   Metoprolol  Succinate 25 MG CS24 Take 25 mg by mouth daily.   08/21/2023 Morning   OXYGEN Inhale into the lungs as directed. 2L   08/21/2023 Evening   pravastatin  (PRAVACHOL ) 10 MG tablet Take 10 mg by mouth at bedtime.   Past Week   Scheduled:   aspirin  EC  81 mg Oral Daily   budesonide   0.25 mg Nebulization BID   cholecalciferol   1,000 Units Oral Daily   docusate sodium   100 mg Oral Daily   furosemide   80 mg Intravenous BID   ipratropium-albuterol   3 mL Nebulization Q4H   [START ON 08/23/2023] methylPREDNISolone  (SOLU-MEDROL ) injection  40 mg Intravenous Q12H   Infusions:   cefTRIAXone  (ROCEPHIN )  IV Stopped (08/22/23 0411)   heparin  950  Units/hr (08/22/23 1325)   PRN: ipratropium-albuterol   Assessment: 89 y.o. female with SOB and elevated troponin, possible ACS, for heparin    Patient was not on anticoagulation prior to arrival. Started on 950 units/hr IV heparin  following 3000 unit IV heparin  bolus. Resulting heparin  level is 0.52 which is therapeutic.  No issues with infusion or bleeding per RN.  Hgb 13.8>14.8; plt 144>169  Goal of Therapy:  Heparin  level 0.3-0.7 units/ml Monitor platelets by anticoagulation protocol: Yes   Plan:  Continue heparin  infusion at 950 units/hr Check anti-Xa level at 2200 and daily while on heparin  Continue to monitor H&H and platelets  Dorn Buttner, PharmD, BCPS 08/22/2023 5:34 PM ED Clinical Pharmacist -  [phone]

## 2023-08-22 NOTE — ED Notes (Addendum)
 Phlebotomy to collect heparin level.

## 2023-08-22 NOTE — Progress Notes (Signed)
 Lower extremity venous duplex completed. Please see CV Procedures for preliminary results.  Shona Simpson, RVT 08/22/23 10:59 AM

## 2023-08-22 NOTE — Consult Note (Addendum)
 Cardiology Consultation   Patient ID: [name] MRN: [id number]; DOB: 17-Oct-1932  Admit date: 08/21/2023 Date of Consult: 08/22/2023  PCP:  Arloa [name], MD   [facility] Providers Cardiologist:  Victory LELON [name], MD (Inactive)        Patient Profile:   [name] is a 89 y.o. female with a hx of HFpEF, HTN, HLD, prior CVA without residual deficits, COPD w/ chronic hypoxic respiratory failure (on 2L O2 [place]) who is being seen 08/22/2023 for the evaluation of elevated troponins at the request of Dr. Franky.  History of Present Illness:   The patient is a poor historian unfortunately given her acute illness.  The following history was obtained from both the patient and chart review.  [name] presented to the ED with chief complaint of SOB.  Reportedly the patient had not been wearing her home oxygen as instructed for unclear reasons.  She visited her primary care physician today and became hypoxic in the setting of a coughing episode and was instructed to come to the ED for evaluation.  The patient does endorse SOB, orthopnea, and lower extremity swelling to me on examination.  She cannot quantify the duration of the symptoms.  She denies chest pain, syncope, palpitations, presyncope, urinary changes, N/V/D.  She has never experienced the symptoms before.  In the ED her VS were afebrile, BP 131/93, HR 110, RR 27.  She desatted to the 70s off oxygen and is currently on HF at 55% FiO2 and 35L flow.  In the ED she was given 40 mg IV Lasix , loaded with aspirin , and given CAP coverage.  CXR showed bilateral pulmonary edema.  ECG shows diffuse ST segment depressions.  Labs notable for BNP 323.7, negative respiratory viral panel, and troponins 17 -> 155. cardiology is consulted for management.   Past Medical History:  Diagnosis Date   Acute back pain with sciatica, unspecified laterality    Allergic rhinitis    BMI 40.0-44.9, adult (HCC)    Carotid artery disease (HCC)  05/11/2022   Carotid US  02/2022: Bilateral ICA 40-59   Cerebrovascular disease    Chronic diastolic (congestive) heart failure (HCC)    Colon polyp    COPD (chronic obstructive pulmonary disease) (HCC)    MILD   Disorder of bone    Estrogen deficiency    Excessive sleepiness    Hyperlipidemia    Hypertension    Incontinence of urine    Irregular heartbeat    Low back pain    Morbid obesity (HCC)    OA (osteoarthritis)    OF THE KNEES   OAB (overactive bladder)    Osteopenia    Osteopenia    Primary osteoarthritis of both knees    Pure hypercholesterolemia    Shingles    Shingles    Stroke (HCC)    Thyroid  cyst    Thyroid  cyst    Ulnar neuropathy of both upper extremities    Vertigo     Past Surgical History:  Procedure Laterality Date   BREAST EXCISIONAL BIOPSY Right    benign     Home Medications:  Prior to Admission medications   Medication Sig Start Date End Date Taking? Authorizing Provider  albuterol  (PROVENTIL ) (2.5 MG/3ML) 0.083% nebulizer solution Take 3 mLs (2.5 mg total) by nebulization every 4 (four) hours as needed for wheezing or shortness of breath. 05/02/23  Yes Hope Almarie LELON, NP  amLODipine  (NORVASC ) 2.5 MG tablet Take 2.5 mg by mouth  daily. 08/27/21  Yes [provider]  aspirin  EC 81 MG EC tablet Take 1 tablet (81 mg total) by mouth daily. Swallow whole. 10/27/20  Yes Ghimire, Donalda HERO, MD  budesonide  (PULMICORT ) 0.25 MG/2ML nebulizer solution Take 2 mLs (0.25 mg total) by nebulization in the morning and at bedtime. 05/02/23  Yes Hope Almarie ORN, NP  Cholecalciferol  (VITAMIN D  PO) Take 1,000 Units by mouth daily.   Yes [provider]  docusate sodium  (COLACE) 100 MG capsule Take 100 mg by mouth daily.   Yes [provider]  furosemide  (LASIX ) 20 MG tablet Take 1 tablet (20 mg total) by mouth daily. 03/24/22  Yes Vann, Jessica U, DO  Glycerin-Polysorbate 80 (REFRESH DRY EYE THERAPY OP) Apply 2 drops to eye as needed (dry  eyes).   Yes [provider]  ipratropium-albuterol  (DUONEB) 0.5-2.5 (3) MG/3ML SOLN Take 3 mLs by nebulization 2 (two) times daily. 05/02/23  Yes Hope Almarie ORN, NP  Metoprolol  Succinate 25 MG CS24 Take 25 mg by mouth daily.   Yes [provider]  OXYGEN Inhale into the lungs as directed. 2L   Yes [provider]  pravastatin  (PRAVACHOL ) 10 MG tablet Take 10 mg by mouth at bedtime. 06/12/22  Yes [provider]    Inpatient Medications: Scheduled Meds:  amLODipine   2.5 mg Oral Daily   aspirin  EC  81 mg Oral Daily   budesonide   0.25 mg Nebulization BID   cholecalciferol   1,000 Units Oral Daily   docusate sodium   100 mg Oral Daily   furosemide   20 mg Oral Daily   ipratropium-albuterol   3 mL Nebulization Q4H   methylPREDNISolone  (SOLU-MEDROL ) injection  80 mg Intravenous Daily   Followed by   NOREEN ON 08/23/2023] predniSONE   40 mg Oral Q breakfast   metoprolol  succinate  25 mg Oral Daily   pravastatin   10 mg Oral QHS   Continuous Infusions:  cefTRIAXone  (ROCEPHIN )  IV Stopped (08/22/23 0411)   PRN Meds: ipratropium-albuterol   Allergies:    Allergies  Allergen Reactions   Atorvastatin Other (See Comments)    Myalgias   Ceftin [Cefuroxime] Diarrhea   Cefuroxime Axetil Diarrhea   Clindamycin Hcl Itching   Clindamycin/Lincomycin Itching   Dapagliflozin      Other Reaction(s): numbness/tingling   Moxifloxacin Itching   Simvastatin  Other (See Comments)    myalgias   Doxycycline Hyclate Diarrhea and Rash   Penicillins Rash    Social History:   Social History   Socioeconomic History   Marital status: Widowed    Spouse name: Not on file   Number of children: Not on file   Years of education: Not on file   Highest education level: Not on file  Occupational History   Not on file  Tobacco Use   Smoking status: Former    Types: Cigarettes   Smokeless tobacco: Never  Vaping Use   Vaping status: Never Used  Substance and Sexual  Activity   Alcohol use: Not Currently   Drug use: Never   Sexual activity: Not on file  Other Topics Concern   Not on file  Social History Narrative   Lives alone   Right Handed   Drinks decaf   Social Drivers of Health   Financial Resource Strain: Not on file  Food Insecurity: Not on file  Transportation Needs: Not on file  Physical Activity: Not on file  Stress: Not on file  Social Connections: Not on file  Intimate Partner Violence: Not on file  Family History:    Family History  Problem Relation Age of Onset   CAD Father    Breast cancer Neg Hx      ROS:  Please see the history of present illness.  All other ROS reviewed and negative.     Physical Exam/Data:   Vitals:   08/22/23 0100 08/22/23 0115 08/22/23 0200 08/22/23 0300  BP: 121/70 (!) 116/47    Pulse: (!) 106 99    Resp: 18 (!) 25    Temp:    (!) 97.3 F (36.3 C)  TempSrc:    Oral  SpO2:  93% 96%   Weight:      Height:       No intake or output data in the 24 hours ending 08/22/23 0445    08/21/2023    5:23 PM 05/02/2023   10:49 AM 10/01/2022   10:51 AM  Last 3 Weights  Weight (lbs) 200 lb 210 lb 9.6 oz 199 lb  Weight (kg) 90.719 kg 95.528 kg 90.266 kg     Body mass index is 40.4 kg/m.  General: Ill-appearing elderly female in moderate respiratory distress HEENT: Atraumatic, normocephalic Vascular: Radial pulses 2+ bilaterally Cardiac: Tachycardic with regular rhythm, no M/R/G Lungs: Rales heard diffusely bilaterally, no wheezes or rhonchi Abd: soft, nontender, no hepatomegaly  Ext: Tense edema in the bilateral lower extremities 1+, WWP Musculoskeletal:  No deformities, BUE and BLE strength normal and equal Skin: warm and dry  Neuro: ANO x 3, moves all extremities Psych:  Normal affect   EKG:  The EKG was personally reviewed and demonstrates:       Telemetry:  Telemetry was personally reviewed and demonstrates: Sinus tachycardia  Relevant CV Studies:   TTE 03/21/22:  IMPRESSIONS      1. Left ventricular ejection fraction, by estimation, is 60 to 65%. The  left ventricle has normal function. The left ventricle has no regional  wall motion abnormalities. Left ventricular diastolic function could not  be evaluated.   2. Right ventricular systolic function is normal. The right ventricular  size is normal. There is mildly elevated pulmonary artery systolic  pressure.   3. The mitral valve is normal in structure. No evidence of mitral valve  regurgitation. No evidence of mitral stenosis. Moderate mitral annular  calcification.   4. The aortic valve is normal in structure. Aortic valve regurgitation is  not visualized. No aortic stenosis is present.   5. The inferior vena cava is normal in size with greater than 50%  respiratory variability, suggesting right atrial pressure of 3 mmHg.   Laboratory Data:  High Sensitivity Troponin:   Recent Labs  Lab 08/21/23 1727 08/21/23 2245  TROPONINIHS 17 155*     Chemistry Recent Labs  Lab 08/21/23 1727  NA 141  K 4.4  CL 94*  CO2 37*  GLUCOSE 111*  BUN 22  CREATININE 0.72  CALCIUM  9.2  GFRNONAA >60  ANIONGAP 10    No results for input(s): PROT, ALBUMIN, AST, ALT, ALKPHOS, BILITOT in the last 168 hours. Lipids No results for input(s): CHOL, TRIG, HDL, LABVLDL, LDLCALC, CHOLHDL in the last 168 hours.  Hematology Recent Labs  Lab 08/21/23 2245  WBC 9.7  RBC 4.55  HGB 13.8  HCT 45.8  MCV 100.7*  MCH 30.3  MCHC 30.1  RDW 14.6  PLT 144*   Thyroid  No results for input(s): TSH, FREET4 in the last 168 hours.  BNP Recent Labs  Lab 08/21/23 1727  BNP 323.7*  DDimer No results for input(s): DDIMER in the last 168 hours.   Radiology/Studies:  DG Chest Port 1 View Result Date: 08/21/2023 CLINICAL DATA:  Shortness of breath EXAM: PORTABLE CHEST 1 VIEW COMPARISON:  03/20/2022 FINDINGS: Cardiac shadow is stable. Aortic calcifications are noted. Mild central vascular congestion  is noted without significant edema. No focal infiltrate is seen. No bony abnormality is noted. IMPRESSION: Mild central vascular congestion without significant edema. Electronically Signed   By: Oneil Devonshire M.D.   On: 08/21/2023 19:03     Assessment and Plan:   [name] is a 89 y.o. female with a hx of HFpEF, HTN, HLD, prior CVA without residual deficits, COPD w/ chronic hypoxic respiratory failure (on 2L O2 [place]) who is being seen 08/22/2023 for the evaluation of elevated troponins at the request of Dr. Franky.  #Flash Pulmonary Edema #Likely Type I NSTEMI #Acute on Chronic Hypoxic and Hypercarbic Respiratory Failure #HLD :: The patient is hypoxic in very short of breath.  Physical exam suggest volume overload.  CXR shows bilateral pulmonary infiltrates consistent with pulmonary edema.  I performed a bedside POCUS and although my windows were limited, I believe that she has focal WMA anteriorly.  I suspect that the patient has suffered an MI with resultant flash pulmonary edema and respiratory failure.  The patient confirmed that she is DNAR and is not interested in intubation nor CPR.  However, she is amenable to invasive procedures such as catheterization.  Currently the patient's respiratory status is too tenuous for catheterization as she cannot even lay flat.  For now I recommend starting BiPAP along with aggressive diuresis.  If she improves from a respiratory status then can revisit the timing of LHC. -Initiate BiPAP with serial blood gases for CO2 monitoring -s/p 40 mg IV Lasix .  Continue diuresis -s/p aspirin  load.  Continue aspirin  81 mg daily. -Start heparin  per pharmacy for ACS -Order echo -Can consider LHC pending improvement in respiratory status -Maintain telemetry -Lipid panel, LPA, A1c, TSH  #HTN -Hold home amlodipine  and metoprolol  given likely systolic dysfunction and softer blood pressures  #COPD -Agree with nebulizer treatments    Risk Assessment/Risk Scores:      TIMI Risk Score for Unstable Angina or Non-ST Elevation MI:   The patient's TIMI risk score is 4, which indicates a 20% risk of all cause mortality, new or recurrent myocardial infarction or need for urgent revascularization in the next 14 days.  New [place]  Heart Association (NYHA) Functional Class NYHA Class IV        For questions or updates, please contact [facility] Please consult [email or web address] for contact info under    Signed, Georganna Archer, MD  08/22/2023 4:45 AM   Personally seen and examined. Agree with Fellow above with the following comments:  [name], an 89 year old with a history of heart failure with preserved ejection fraction, COPD, hypertension, hyperlipidemia, and a prior CVA without cognitive deficits, presented with shortness of breath and cough. She also reported experiencing lower extremity edema and orthopnea, but was unsure of the duration of these symptoms.She was DNR but amenable to interventions such as heart catheterization. Her blood pressure was low, likely related to new systolic dysfunction.  She cannot get history with me.  In discussing with AHF team, she has previously refused care.  Dr. Archer above notes some of the care she is not amenable to.  In the emergency room, she required 70% oxygen and was initially placed on high flow nasal cannula. A  chest x-ray revealed bilateral pleural effusions. An EKG showed very prominent ST depressions globally, specifically noted in leads 2, 3, V3, V4, V5, and V6. These changes were not present in 2023. Despite these findings, there was only a minimal increase in high sensitivity troponin from 17 to 155.  Exam: VITALS: BP- 120/80, RR- tachypnea 23 RR, SaO2- 70% CHEST: bilateral crackles EXTREMITIES: bilateral lower extremity edema, legs not cool to touch NEUROLOGICAL: responds to painful stimuli, does not open eyes or respond verbally, presence of asterixis (hands)  LABS Troponin: 17 to 155  (08/22/2023)  RADIOLOGY Chest x-ray: Bilateral pleural effusions (08/22/2023)  DIAGNOSTIC EKG: Prominent ST depressions in leads II, III, V3, V4, V5, V6 (08/22/2023)   NSTEMI   Non-ST elevation myocardial infarction (NSTEMI) with prominent ST depressions in leads II, III, V3-V6. High sensitivity troponin increased from 17 to 155. High risk for coronary artery disease, no recent ischemic workup. Bedside POCUS (unavailable done by my colleague) showed wall motion abnormality in LAD territory.  DNR/DNI status but amenable to heart catheterization.  - Order formal echocardiogram   - Plan for heart catheterization once euvolemic and stable   - Hold metoprolol    - Load with aspirin    - Administer heparin     Heart Failure with Preserved Ejection Fraction (HFpEF)   HFpEF with lower extremity edema and orthopnea. Bilateral pleural effusions on chest x-ray. Blood pressure 120/80.  - Bmp in PM, will increase to 80 IV lasix   Hypertension   Hypertension with current blood pressure 120/80. - Monitor blood pressure closely    Hypoxic Respiratory Failure   Hypoxic respiratory failure with shortness of breath and cough. Currently on BiPAP. History of COPD, chronically on 2L oxygen which she does not use. Bilateral crackles and tachypnea on exam.  - discussed care with critical care team   - Continue BiPAP support    Chronic Obstructive Pulmonary Disease (COPD)   Suspect she is retaining CO2 and this is causing AMS.   COPD, chronically on 2L oxygen which she does not use. Managed by internal medicine and critical care teams.   Hyperlipidemia   - labs pending  Prior Cerebrovascular Accident (CVA)   Prior CVA without cognitive deficits.  Labs pending  Goals of Care   DNR/DNI status. Amenable to heart catheterization per my PM colleague.  Stanly Leavens, MD FASE [facility] Cardiologist [facility]  [address] [place], [address] [place], [place] [address] [phone]  8:39 AM

## 2023-08-22 NOTE — Progress Notes (Signed)
 ANTICOAGULATION CONSULT NOTE - Follow-up Note  Pharmacy Consult for Heparin  Indication: chest pain/ACS  Allergies  Allergen Reactions   Atorvastatin Other (See Comments)    Myalgias   Ceftin [Cefuroxime] Diarrhea   Cefuroxime Axetil Diarrhea   Clindamycin Hcl Itching   Clindamycin/Lincomycin Itching   Dapagliflozin      Other Reaction(s): numbness/tingling   Moxifloxacin Itching   Simvastatin  Other (See Comments)    myalgias   Doxycycline Hyclate Diarrhea and Rash   Penicillins Rash    Patient Measurements: Height: 4' 11 (149.9 cm) Weight: 90.7 kg (200 lb) IBW/kg (Calculated) : 43.2 Heparin  Dosing Weight: 65 kg  Vital Signs: Temp: 98.5 F (36.9 C) (01/09 1946) Temp Source: Oral (01/09 1946) BP: 118/81 (01/09 1946) Pulse Rate: 81 (01/09 2000)  Labs: Recent Labs    08/21/23 1727 08/21/23 2245 08/22/23 0500 08/22/23 1603 08/22/23 1845 08/22/23 2205  HGB  --  13.8 14.8  --   --   --   HCT  --  45.8 49.2*  --   --   --   PLT  --  144* 169  --   --   --   HEPARINUNFRC  --   --   --  0.52  --  0.59  CREATININE 0.72  --   --  0.93 1.15*  --   TROPONINIHS 17 155*  --   --   --   --     Estimated Creatinine Clearance: 33.2 mL/min (A) (by C-G formula based on SCr of 1.15 mg/dL (H)).   Medical History: Past Medical History:  Diagnosis Date   Acute back pain with sciatica, unspecified laterality    Allergic rhinitis    BMI 40.0-44.9, adult (HCC)    Carotid artery disease (HCC) 05/11/2022   Carotid US  02/2022: Bilateral ICA 40-59   Cerebrovascular disease    Chronic diastolic (congestive) heart failure (HCC)    Colon polyp    COPD (chronic obstructive pulmonary disease) (HCC)    MILD   Disorder of bone    Estrogen deficiency    Excessive sleepiness    Hyperlipidemia    Hypertension    Incontinence of urine    Irregular heartbeat    Low back pain    Morbid obesity (HCC)    OA (osteoarthritis)    OF THE KNEES   OAB (overactive bladder)    Osteopenia     Osteopenia    Primary osteoarthritis of both knees    Pure hypercholesterolemia    Shingles    Shingles    Stroke (HCC)    Thyroid  cyst    Thyroid  cyst    Ulnar neuropathy of both upper extremities    Vertigo     Medications:  Medications Prior to Admission  Medication Sig Dispense Refill Last Dose/Taking   albuterol  (PROVENTIL ) (2.5 MG/3ML) 0.083% nebulizer solution Take 3 mLs (2.5 mg total) by nebulization every 4 (four) hours as needed for wheezing or shortness of breath. 75 mL 1 Past Month   amLODipine  (NORVASC ) 2.5 MG tablet Take 2.5 mg by mouth daily.   08/21/2023 Morning   aspirin  EC 81 MG EC tablet Take 1 tablet (81 mg total) by mouth daily. Swallow whole. 30 tablet 11 08/21/2023 Morning   budesonide  (PULMICORT ) 0.25 MG/2ML nebulizer solution Take 2 mLs (0.25 mg total) by nebulization in the morning and at bedtime. 60 mL 12 Past Week   Cholecalciferol  (VITAMIN D  PO) Take 1,000 Units by mouth daily.   08/21/2023 Morning   docusate sodium  (  COLACE) 100 MG capsule Take 100 mg by mouth daily.   08/21/2023 Morning   furosemide  (LASIX ) 20 MG tablet Take 1 tablet (20 mg total) by mouth daily. 30 tablet  08/21/2023 Morning   Glycerin-Polysorbate 80 (REFRESH DRY EYE THERAPY OP) Apply 2 drops to eye as needed (dry eyes).   Past Month   ipratropium-albuterol  (DUONEB) 0.5-2.5 (3) MG/3ML SOLN Take 3 mLs by nebulization 2 (two) times daily. 160 mL 1 Past Month   Metoprolol  Succinate 25 MG CS24 Take 25 mg by mouth daily.   08/21/2023 Morning   OXYGEN Inhale into the lungs as directed. 2L   08/21/2023 Evening   pravastatin  (PRAVACHOL ) 10 MG tablet Take 10 mg by mouth at bedtime.   Past Week   Scheduled:   aspirin  EC  81 mg Oral Daily   budesonide   0.25 mg Nebulization BID   cholecalciferol   1,000 Units Oral Daily   docusate sodium   100 mg Oral Daily   furosemide   80 mg Intravenous BID   ipratropium-albuterol   3 mL Nebulization Q4H   [START ON 08/23/2023] methylPREDNISolone  (SOLU-MEDROL ) injection  40 mg  Intravenous Q12H   Infusions:   cefTRIAXone  (ROCEPHIN )  IV 1 g (08/22/23 2207)   heparin  950 Units/hr (08/22/23 1325)   PRN: ipratropium-albuterol   Assessment: 89 y.o. female with SOB and elevated troponin, possible ACS, for heparin . Patient was not on anticoagulation prior to arrival.  Goal of Therapy:  Heparin  level 0.3-0.7 units/ml Monitor platelets by anticoagulation protocol: Yes   Plan:  Continue heparin  infusion at 950 units/hr Daily heparin  level and CBC  Prentice Poisson, PharmD Clinical Pharmacist **Pharmacist phone directory can now be found on [email or web address] ([email or web address]).  Listed under [facility].

## 2023-08-22 NOTE — Progress Notes (Signed)
 PROGRESS NOTE    [name]  FMW:[id number] DOB: Dec 19, 1932 DOA: 08/21/2023 PCP: Arloa [name], MD    Brief Narrative:   [name] is a 89 y.o. female with past medical history significant for chronic diastolic congestive heart failure, HTN, HLD, history of CVA without residual deficits, COPD with chronic hypoxic respiratory failure on 2 L nasal cannula at baseline carotid artery stenosis who presented to [facility] ED on 1/8 via EMS with progressive shortness of breath.  She apparently has not been noncompliant with her home O2, was being seen by her PCP with noted SpO2 72% on room air.  Patient was given neb treatment, placed on supplemental oxygen.  EMS activated and patient was transported to ED for further evaluation and management.  In the ED, temperature 97.8 F, HR 100, RR 27, BP 131/93, SpO2 96% on high flow nasal cannula 35 L/min.  WBC 9.7, hemoglobin 13.8, platelet count 144.  Sodium 141, potassium 4.4, chloride 94, CO2 37, BUN 22, creatinine 0.72, glucose 111.  BNP 323.7.  High-sensitivity troponin 17> 155.  D-dimer 0.76.  Hemoglobin A1c 6.1.  TSH 0.984.  COVID/influenza/RSV PCR negative.  Chest x-ray with mild central vascular congestion without significant edema.  Patient was started on scheduled neb treatments, steroids, antibiotics and 1 dose of IV Lasix  was given.  Cardiology was consulted for NSTEMI.  TR[facility] consulted for admission for further evaluation management of acute hypoxic respite failure secondary to pulmonary edema versus COPD exacerbation in the setting of noncompliance with medical recommendations.  Shortly after admission, patient became more lethargic, somnolent.  ABG was performed with findings of pH 7.141, pCO2 greater than 110, pO2 179.  PCCM was consulted.  Patient was placed on BiPAP.  Significant Hospital events: 1/8: Admit to [facility], cardiology consulted 1/9: Encephalopathy, ABG with pCO2 greater than 110, PCCM consulted; placed on BiPAP  Assessment & Plan:    Acute metabolic encephalopathy Patient presents with progressive shortness of breath was found to be hypoxic at PCP office in which she is noncompliant with home O2.  Requiring up to 35 L high flow nasal cannula while in the ED and became more lethargic/somnolent with check of ABG notable for pH 7.14, pCO2 greater than 110.  Sh within normal limits.  Etiology likely secondary to hypercapnic respiratory failure. -- Continue BiPAP; goal SpO2 greater than 88% -- Repeat VBG in the a.m. -- Supportive care  Acute hypoxic/hypercapnic respiratory failure COPD exacerbation Patient presenting to ED with progressive shortness of breath, noncompliant with home O2.  At PCP office SpO2 noted to be 72% on room air.  On arrival to the ED patient requiring up to 35 L high flow nasal cannula.  VBG with pH 7.14, pCO2 greater than 110.  COVID/influenza/RSV PCR negative.  Checks x-ray with no focal consolidation. -- Respiratory viral panel: Pending -- Pulmicort  neb twice daily -- DuoNeb every 4 hours scheduled and every 2 hours as needed wheezing/shortness of breath -- Ceftriaxone  1 g IV every 24 hours -- Solu-Medrol  40 mg IV every 12 hours -- Continue BiPAP, FiO2 60%, maintain SpO2 greater than 88%  NSTEMI  Acute on chronic diastolic congestive heart failure with flash pulmonary edema Patient presenting with progressive shortness of breath, likely combination COPD versus pulmonary edema.  BNP elevated with vascular congestion noted on chest x-ray. -- Cardiology following appreciate assistance -- TTE: Pending -- Troponin 17>155 -- Heparin  drip, pharmacy consulted for management -- Lasix  80 mg IV every 12 hours -- Aspirin  81 mg p.o. daily  if able to tolerate -- BiPAP as above -- Strict I's and O's and daily weights -- Monitor on telemetry -- Cardiology plans left heart catheterization once euvolemic and stable -- BMP daily  Elevated D-dimer D-dimer 0.76; which is normal after age adjustment.  Venous  duplex ultrasound bilateral lower extremities negative for DVT.  Essential hypertension On amlodipine  2.5 mg p.o. daily, furosemide  20 mg p.o. daily, metoprolol  succinate 25 mg p.o. daily at baseline. -- Holding home amlodipine , metoprolol , furosemide  -- On IV furosemide  as above -- Continue monitor BP closely  Hyperlipidemia -- Hold statin while on BiPAP  History of CVA -- Continue aspirin  if able -- Holding statin while on BiPAP    DVT prophylaxis: Heparin  drip    Code Status: Limited: Do not attempt resuscitation (DNR) -DNR-LIMITED -Do Not Intubate/DNI  Family Communication: No family present at bedside this morning  Disposition Plan:  Level of care: Progressive Status is: Inpatient Remains inpatient appropriate because: On BiPAP, continues to be lethargic/somnolent secondary to hypercapnia, IV antibiotics, scheduled neb treatments    Consultants:  PCCM Cardiology  Procedures:  TTE Vascular duplex ultrasound bilateral lower extremities  Antimicrobials:  Ceftriaxone    Subjective: Patient seen examined bedside, lying in bed.  Remains in ED holding area.  Continues on BiPAP.  Remains somnolent.  No family present at bedside.  Will withdrawal to painful stimuli.  Otherwise unable to obtain any further ROS due to her current mental status.  Seen by cardiology and PCCM this morning at due to change in somnolence/lethargy.  Noted pCO2 greater than 110 and placed on BiPAP.  Remains on IV diuresis for flash pulmonary edema secondary to decompensated CHF with NSTEMI.  On heparin  drip.  No other acute concerns overnight per nursing staff.  Objective: Vitals:   08/22/23 1145 08/22/23 1200 08/22/23 1215 08/22/23 1230  BP:    134/78  Pulse: 82 80 73 63  Resp: 19 (!) 22 10 19   Temp:      TempSrc:      SpO2: 93% 99% 100% 98%  Weight:      Height:       No intake or output data in the 24 hours ending 08/22/23 1304 Filed Weights   08/21/23 1723  Weight: 90.7 kg     Examination:  Physical Exam: GEN: NAD, lethargic/somnolent, withdraws to painful stimuli HEENT: NCAT, PERRL, sclera clear, MMM PULM: Breath sounds slight diminished bilateral bases with wheezing/crackles, normal respiratory effort without accessory muscle use, on BiPAP with FiO2 60% CV: RRR w/o M/G/R GI: abd soft, nondistended, + BS MSK: no peripheral edema, muscle strength globally intact 5/5 bilateral upper/lower extremities NEURO: Unable to fully assess due to mental status, but will move all extremities independently but not to command, withdraws to pain Integumentary: No concerning rashes/lesions/wounds noted on exposed skin surfaces    Data Reviewed: I have personally reviewed following labs and imaging studies  CBC: Recent Labs  Lab 08/21/23 2245 08/22/23 0500  WBC 9.7 8.8  NEUTROABS 9.2*  --   HGB 13.8 14.8  HCT 45.8 49.2*  MCV 100.7* 101.4*  PLT 144* 169   Basic Metabolic Panel: Recent Labs  Lab 08/21/23 1727  NA 141  K 4.4  CL 94*  CO2 37*  GLUCOSE 111*  BUN 22  CREATININE 0.72  CALCIUM  9.2   GFR: Estimated Creatinine Clearance: 47.7 mL/min (by C-G formula based on SCr of 0.72 mg/dL). Liver Function Tests: No results for input(s): AST, ALT, ALKPHOS, BILITOT, PROT, ALBUMIN in the last 168  hours. No results for input(s): LIPASE, AMYLASE in the last 168 hours. No results for input(s): AMMONIA in the last 168 hours. Coagulation Profile: No results for input(s): INR, PROTIME in the last 168 hours. Cardiac Enzymes: No results for input(s): CKTOTAL, CKMB, CKMBINDEX, TROPONINI in the last 168 hours. BNP (last 3 results) No results for input(s): PROBNP in the last 8760 hours. HbA1C: Recent Labs    08/22/23 0500  HGBA1C 6.1*   CBG: No results for input(s): GLUCAP in the last 168 hours. Lipid Profile: Recent Labs    08/22/23 0500  CHOL 179  HDL 67  LDLCALC 102*  TRIG 50  CHOLHDL 2.7   Thyroid  Function  Tests: Recent Labs    08/22/23 0500  TSH 0.984   Anemia Panel: No results for input(s): VITAMINB12, FOLATE, FERRITIN, TIBC, IRON, RETICCTPCT in the last 72 hours. Sepsis Labs: No results for input(s): PROCALCITON, LATICACIDVEN in the last 168 hours.  Recent Results (from the past 240 hours)  Resp panel by RT-PCR (RSV, Flu A&B, Covid) Anterior Nasal Swab     Status: None   Collection Time: 08/21/23  5:28 PM   Specimen: Anterior Nasal Swab  Result Value Ref Range Status   SARS Coronavirus 2 by RT PCR NEGATIVE NEGATIVE Final   Influenza A by PCR NEGATIVE NEGATIVE Final   Influenza B by PCR NEGATIVE NEGATIVE Final    Comment: (NOTE) The Xpert Xpress SARS-CoV-2/FLU/RSV plus assay is intended as an aid in the diagnosis of influenza from Nasopharyngeal swab specimens and should not be used as a sole basis for treatment. Nasal washings and aspirates are unacceptable for Xpert Xpress SARS-CoV-2/FLU/RSV testing.  Fact Sheet for Patients: bloggercourse.com  Fact Sheet for Healthcare Providers: seriousbroker.it  This test is not yet approved or cleared by the [place]  FDA and has been authorized for detection and/or diagnosis of SARS-CoV-2 by FDA under an Emergency Use Authorization (EUA). This EUA will remain in effect (meaning this test can be used) for the duration of the COVID-19 declaration under Section 564(b)(1) of the Act, 21 U.S.C. section 360bbb-3(b)(1), unless the authorization is terminated or revoked.     Resp Syncytial Virus by PCR NEGATIVE NEGATIVE Final    Comment: (NOTE) Fact Sheet for Patients: bloggercourse.com  Fact Sheet for Healthcare Providers: seriousbroker.it  This test is not yet approved or cleared by the [place]  FDA and has been authorized for detection and/or diagnosis of SARS-CoV-2 by FDA under an Emergency Use Authorization  (EUA). This EUA will remain in effect (meaning this test can be used) for the duration of the COVID-19 declaration under Section 564(b)(1) of the Act, 21 U.S.C. section 360bbb-3(b)(1), unless the authorization is terminated or revoked.  Performed at [facility], [address], [place], [place] [address]          Radiology Studies: VAS US  LOWER EXTREMITY VENOUS (DVT) (ONLY [facility] & [facility]) Result Date: 08/22/2023  Lower Venous DVT Study Patient Name:  Labrisha M Bosko  Date of Exam:   08/22/2023 Medical Rec #: [id number]      Accession #:    [id number] Date of Birth: 03/23/1933      Patient Gender: F Patient Age:   77 years Exam Location:  [facility] Procedure:      VAS US  LOWER EXTREMITY VENOUS (DVT) Referring Phys: REDIA CLEAVER --------------------------------------------------------------------------------  Indications: Swelling, and Edema.  Risk Factors: Obesity and past pregnancy. Limitations: Body habitus. Comparison Study: No changes seen since previous exam 10/21/20. Performing Technologist: Garnette  Wedde  Examination Guidelines: A complete evaluation includes B-mode imaging, spectral Doppler, color Doppler, and power Doppler as needed of all accessible portions of each vessel. Bilateral testing is considered an integral part of a complete examination. Limited examinations for reoccurring indications may be performed as noted. The reflux portion of the exam is performed with the patient in reverse Trendelenburg.  +---------+---------------+---------+-----------+----------+--------------+ RIGHT    CompressibilityPhasicitySpontaneityPropertiesThrombus Aging +---------+---------------+---------+-----------+----------+--------------+ CFV      Full           Yes      Yes                                 +---------+---------------+---------+-----------+----------+--------------+ SFJ      Full                                                         +---------+---------------+---------+-----------+----------+--------------+ FV Prox  Full                                                        +---------+---------------+---------+-----------+----------+--------------+ FV Mid   Full                                                        +---------+---------------+---------+-----------+----------+--------------+ FV DistalFull                                                        +---------+---------------+---------+-----------+----------+--------------+ PFV      Full                                                        +---------+---------------+---------+-----------+----------+--------------+ POP      Full           Yes      Yes                                 +---------+---------------+---------+-----------+----------+--------------+ PTV      Full                                                        +---------+---------------+---------+-----------+----------+--------------+ PERO     Full                                                        +---------+---------------+---------+-----------+----------+--------------+   +---------+---------------+---------+-----------+----------+--------------+  LEFT     CompressibilityPhasicitySpontaneityPropertiesThrombus Aging +---------+---------------+---------+-----------+----------+--------------+ CFV      Full           Yes      Yes                                 +---------+---------------+---------+-----------+----------+--------------+ SFJ      Full                                                        +---------+---------------+---------+-----------+----------+--------------+ FV Prox  Full                                                        +---------+---------------+---------+-----------+----------+--------------+ FV Mid   Full                                                         +---------+---------------+---------+-----------+----------+--------------+ FV DistalFull                                                        +---------+---------------+---------+-----------+----------+--------------+ PFV      Full                                                        +---------+---------------+---------+-----------+----------+--------------+ POP      Full           Yes      Yes                                 +---------+---------------+---------+-----------+----------+--------------+ PTV      Full                                                        +---------+---------------+---------+-----------+----------+--------------+ PERO     Full                                                        +---------+---------------+---------+-----------+----------+--------------+     Summary: BILATERAL: - No evidence of deep vein thrombosis seen in the lower extremities, bilaterally. -No evidence of popliteal cyst, bilaterally.   *See table(s) above for measurements and observations.    Preliminary    DG CHEST PORT 1 VIEW Result  Date: 08/22/2023 CLINICAL DATA:  [id number] with shortness of breath. EXAM: PORTABLE CHEST 1 VIEW COMPARISON:  Portable chest 08/21/2023 at 5:41. FINDINGS: 5:11 a.m. The heart is enlarged. There are increased bilateral small layering pleural effusions partially obscuring the lung bases with overlying hazy interstitial consolidation/atelectasis. There is increased central vascular distension, and mild generalized increased interstitial consolidation consistent with interstitial edema. The mediastinum is normally outlined. The aortic arch is heavily calcified. Osteopenia. Thoracic spondylosis. IMPRESSION: 1. Increased bilateral small layering pleural effusions with overlying hazy interstitial consolidation/atelectasis. 2. Increased interstitial consolidation consistent with interstitial edema. 3. Cardiomegaly. 4. Aortic atherosclerosis. Electronically  Signed   By: Francis Quam M.D.   On: 08/22/2023 05:45   DG Chest Port 1 View Result Date: 08/21/2023 CLINICAL DATA:  Shortness of breath EXAM: PORTABLE CHEST 1 VIEW COMPARISON:  03/20/2022 FINDINGS: Cardiac shadow is stable. Aortic calcifications are noted. Mild central vascular congestion is noted without significant edema. No focal infiltrate is seen. No bony abnormality is noted. IMPRESSION: Mild central vascular congestion without significant edema. Electronically Signed   By: Oneil Devonshire M.D.   On: 08/21/2023 19:03        Scheduled Meds:  aspirin  EC  81 mg Oral Daily   budesonide   0.25 mg Nebulization BID   cholecalciferol   1,000 Units Oral Daily   docusate sodium   100 mg Oral Daily   furosemide   80 mg Intravenous BID   ipratropium-albuterol   3 mL Nebulization Q4H   pravastatin   10 mg Oral QHS   [START ON 08/23/2023] predniSONE   40 mg Oral Q breakfast   Continuous Infusions:  cefTRIAXone  (ROCEPHIN )  IV Stopped (08/22/23 0411)   heparin  950 Units/hr (08/22/23 0518)     LOS: 0 days    Critical Care Time Upon my evaluation, this patient had a high probability of imminent or life-threatening deterioration due to acute hypoxic/hypercapnic respiratory failure secondary to COPD exacerbation, NSTEMI with decompensated diastolic congestive heart failure with flash pulmonary edema requiring BiPAP, which required my direct attention, intervention, and personal management.  I have personally provided 54 minutes of critical care time exclusive of my time spent on separately billable procedures.  Time includes review of laboratory data, radiology results, discussion with consultants, and monitoring for potential decompensation.       Camellia PARAS Riven Mabile, DO [facility] Available via Epic secure chat 7am-7pm After these hours, please refer to coverage provider listed on [email or web address] 08/22/2023, 1:04 PM

## 2023-08-22 NOTE — Consult Note (Addendum)
 NAME:  QUANTISHA MARSICANO, MRN:  [id number], DOB:  May 15, 1933, LOS: 0 ADMISSION DATE:  08/21/2023, CONSULTATION DATE:  1/9 REFERRING MD:  Dr. Franky, CHIEF COMPLAINT:  Shortness of Breath  History of Present Illness:  89 year old female with past medical history as below, which is significant for heart failure with preserved ejection fraction, hypertension, hyperlipidemia, stroke, COPD, and chronic respiratory failure with hypoxia on 2 L.  Sounds like she is not very compliant with her home oxygen.  On 1/9 she had a PCP appointment during which she had a coughing spell and became hypoxic.  She was sent to the emergency department and upon arrival was found to be short of breath with cough.  She was treated for COPD exacerbation with nebulized bronchodilators, IV steroids, and azithromycin .  She developed chest pain at some point during the ED stay and EKG was done showing global ST depressions.  Cardiology was consulted and recommended nitroglycerin  and heparin .  Troponins were mildly positive.  Respiratory status initially stabilized on nasal cannula, however, it did end up worsening to the point she was requiring BiPAP. ABG showed a severe respiratory acidosis with CO2 > 100. PCCM was asked to evaluate.   Pertinent  Medical History   Past Medical History:  Diagnosis Date   Acute back pain with sciatica, unspecified laterality    Allergic rhinitis    BMI 40.0-44.9, adult (HCC)    Carotid artery disease (HCC) 05/11/2022   Carotid US  02/2022: Bilateral ICA 40-59   Cerebrovascular disease    Chronic diastolic (congestive) heart failure (HCC)    Colon polyp    COPD (chronic obstructive pulmonary disease) (HCC)    MILD   Disorder of bone    Estrogen deficiency    Excessive sleepiness    Hyperlipidemia    Hypertension    Incontinence of urine    Irregular heartbeat    Low back pain    Morbid obesity (HCC)    OA (osteoarthritis)    OF THE KNEES   OAB (overactive bladder)    Osteopenia     Osteopenia    Primary osteoarthritis of both knees    Pure hypercholesterolemia    Shingles    Shingles    Stroke (HCC)    Thyroid  cyst    Thyroid  cyst    Ulnar neuropathy of both upper extremities    Vertigo     Significant Hospital Events: Including procedures, antibiotic start and stop dates in addition to other pertinent events   01/09 Admit to Hospitalist >> PCCM consult w/ worsening resp failure requiring NIV  Interim History / Subjective:  N/A  Objective   Blood pressure (!) 115/95, pulse (!) 101, temperature (!) 97.3 F (36.3 C), temperature source Oral, resp. rate 19, height 4' 11 (1.499 m), weight 90.7 kg, SpO2 99%.    FiO2 (%):  [50 %-55 %] 50 % PEEP:  [8 cmH20] 8 cmH20  No intake or output data in the 24 hours ending 08/22/23 0609 Filed Weights   08/21/23 1723  Weight: 90.7 kg    Examination: General: Elderly female.  Obese.  No acute distress.  Examined on noninvasive mechanical ventilation. Integument: Dry.  Warm.  Mild erythema of bilateral lower extremities distally.  No rash appreciated. Lymphatics: No appreciated cervical or supraclavicular lymphadenopathy. HEENT: Ventilator mask in place.  No sclericterus.  No scleral injection. Respiratory: Symmetrically decreased breath sounds.  Symmetric chest wall expansion on ventilator. Otherwise clear with auscultation. Cardiovascular: Regular rate.  Pitting lower extremity edema noted.  No murmur appreciated. Abdomen: Protuberant.  No voluntary guarding.  Normal bowel sounds.  Grossly nontender. Extremities: No cyanosis.  No clubbing. Musculoskeletal: Normal bulk and tone.  No joint deformity or effusion appreciated. Neurological: No meningismus.  Symmetric face.  Moving all 4 extremities equally.  Pupils symmetric and reactive with normal extraocular movements. Psychiatric: Unable to assess with ventilator mask in place.  Patient seems to be oriented and appropriate.  Resolved Hospital Problem list    N/A  Assessment & Plan:  89 y.o. female presenting with acute on chronic hypoxic and hypercarbic respiratory failure.  Patient notably does not consistently use her home oxygen.  Clinical picture seems consistent with volume overload as a result of acute on chronic Solik congestive heart failure.  Respiratory status has worsened since presenting to the emergency department such that she has required initiation of noninvasive mechanical ventilation.  I cannot rule out an acute exacerbation of COPD at this time either.  CODE STATUS confirmed as DNR/DNI.  1.  Acute on chronic respiratory failure with hypoxia: Present on arrival and progressively worsening.  Multifactorial in etiology with pulmonary edema as well as potential to PD with acute exacerbation.  Recommend continuing to wean FiO2 to maintain saturation 88 to 94%.  Recommend continuous pulse oximetry monitoring.  Recommend diuresis with IV medication as renal function and blood pressure allow.  Recommend continuing noninvasive mechanical ventilation for respiratory support to ease work of breathing.  2.  Acute on chronic hypercarbic respiratory failure: Most likely secondary to COPD with acute exacerbation.  Cannot rule out underlying sleep disordered breathing versus obesity hypoventilation syndrome.  Avoiding VQ mismatching.  Continuing treatment of potential COPD exacerbation as follows.  Monitoring with ABG intermittently. Recommend continuing noninvasive mechanical ventilation for respiratory support to ease work of breathing.  3.  Possible COPD with acute exacerbation: Patient with a known prior history of tobacco use. Recommend continuing noninvasive mechanical ventilation for respiratory support to ease work of breathing.  Continuing scheduled DuoNebs every 4 hours, nebulized Pulmicort  0.25 mg twice daily, and IV Solu-Medrol  transitioning to prednisone  40 mg daily starting tomorrow.  Respiratory viral panel PCR is pending.  3.  Non-STEMI:  Likely secondary to acute on chronic congestive heart failure.  Patient continuing on aspirin  81 mg daily, Pravachol  10 mg nightly, and heparin  drip as per protocol.  Cardiology consulted and following.  Patient continuing on telemetry monitoring.  Echocardiogram is pending.  4.  Acute on chronic congestive heart failure: Echocardiogram is currently pending.  Recommend continuing diuresis with IV Lasix  as renal function and blood pressure allow.  Cardiology consulted and following.  5.  Hyperlipidemia: Continuing Pravachol  10 mg nightly.  6.  Disposition: Agree with plan for admission to progressive care unit.  Remainder of cares per primary service and other consultants.  Best Practice (right click and Reselect all SmartList Selections daily)   Per primary  Labs   CBC: Recent Labs  Lab 08/21/23 2245 08/22/23 0500  WBC 9.7 8.8  NEUTROABS 9.2*  --   HGB 13.8 14.8  HCT 45.8 49.2*  MCV 100.7* 101.4*  PLT 144* 169    Basic Metabolic Panel: Recent Labs  Lab 08/21/23 1727  NA 141  K 4.4  CL 94*  CO2 37*  GLUCOSE 111*  BUN 22  CREATININE 0.72  CALCIUM  9.2   GFR: Estimated Creatinine Clearance: 47.7 mL/min (by C-G formula based on SCr of 0.72 mg/dL). Recent Labs  Lab 08/21/23 2245 08/22/23 0500  WBC 9.7 8.8  Liver Function Tests: No results for input(s): AST, ALT, ALKPHOS, BILITOT, PROT, ALBUMIN in the last 168 hours. No results for input(s): LIPASE, AMYLASE in the last 168 hours. No results for input(s): AMMONIA in the last 168 hours.  ABG    Component Value Date/Time   PHART 7.181 (LL) 03/19/2022 1918   PCO2ART 100.1 (HH) 03/19/2022 1918   PO2ART 89 03/19/2022 1918   HCO3 43.7 (H) 03/20/2022 0932   TCO2 43 (H) 03/19/2022 2217   O2SAT 56 03/20/2022 0932     Coagulation Profile: No results for input(s): INR, PROTIME in the last 168 hours.  Cardiac Enzymes: No results for input(s): CKTOTAL, CKMB, CKMBINDEX, TROPONINI in  the last 168 hours.  HbA1C: Hgb A1c MFr Bld  Date/Time Value Ref Range Status  08/22/2023 05:00 AM 6.1 (H) 4.8 - 5.6 % Final    Comment:    (NOTE) Pre diabetes:          5.7%-6.4%  Diabetes:              >6.4%  Glycemic control for   <7.0% adults with diabetes   03/20/2022 08:44 AM 5.6 4.8 - 5.6 % Final    Comment:    (NOTE) Pre diabetes:          5.7%-6.4%  Diabetes:              >6.4%  Glycemic control for   <7.0% adults with diabetes     CBG: No results for input(s): GLUCAP in the last 168 hours.  IMAGING: PORTABLE CXR 08/22/23 (personally reviewed by me): Bilateral hazy basilar opacities consistent with consolidation versus atelectasis with likely pleural effusions.  Trachea midline.  Mediastinal contour normal.  Cardiomegaly suggested.  No pneumothorax.  Review of Systems:   Limited by BiPAP Bolds are positive  Constitutional: weight loss, gain, night sweats, Fevers, chills, fatigue .  HEENT: headaches, Sore throat, sneezing, nasal congestion, post nasal drip, Difficulty swallowing, Tooth/dental problems, visual complaints visual changes, ear ache CV:  chest pain, radiates:,Orthopnea, PND, swelling in lower extremities, dizziness, palpitations, syncope.  GI  heartburn, indigestion, abdominal pain, nausea, vomiting, diarrhea, change in bowel habits, loss of appetite, bloody stools.  Resp: cough, productive: , hemoptysis, dyspnea, chest pain, pleuritic.  Skin: rash or itching or icterus GU: dysuria, change in color of urine, urgency or frequency. flank pain, hematuria  MS: joint pain or swelling. decreased range of motion  Psych: change in mood or affect. depression or anxiety.  Neuro: difficulty with speech, weakness, numbness, ataxia    Past Medical History:   Past Medical History:  Diagnosis Date   Acute back pain with sciatica, unspecified laterality    Allergic rhinitis    BMI 40.0-44.9, adult (HCC)    Carotid artery disease (HCC) 05/11/2022   Carotid  US  02/2022: Bilateral ICA 40-59   Cerebrovascular disease    Chronic diastolic (congestive) heart failure (HCC)    Colon polyp    COPD (chronic obstructive pulmonary disease) (HCC)    MILD   Disorder of bone    Estrogen deficiency    Excessive sleepiness    Hyperlipidemia    Hypertension    Incontinence of urine    Irregular heartbeat    Low back pain    Morbid obesity (HCC)    OA (osteoarthritis)    OF THE KNEES   OAB (overactive bladder)    Osteopenia    Osteopenia    Primary osteoarthritis of both knees    Pure hypercholesterolemia  Shingles    Shingles    Stroke (HCC)    Thyroid  cyst    Thyroid  cyst    Ulnar neuropathy of both upper extremities    Vertigo     Surgical History:   Past Surgical History:  Procedure Laterality Date   BREAST EXCISIONAL BIOPSY Right    benign     Social History:   reports that she has quit smoking. Her smoking use included cigarettes. She has never used smokeless tobacco. She reports that she does not currently use alcohol. She reports that she does not use drugs.   Family History:  Her family history includes CAD in her father. There is no history of Breast cancer.   Allergies Allergies  Allergen Reactions   Atorvastatin Other (See Comments)    Myalgias   Ceftin [Cefuroxime] Diarrhea   Cefuroxime Axetil Diarrhea   Clindamycin Hcl Itching   Clindamycin/Lincomycin Itching   Dapagliflozin      Other Reaction(s): numbness/tingling   Moxifloxacin Itching   Simvastatin  Other (See Comments)    myalgias   Doxycycline Hyclate Diarrhea and Rash   Penicillins Rash     Home Medications  Prior to Admission medications   Medication Sig Start Date End Date Taking? Authorizing Provider  albuterol  (PROVENTIL ) (2.5 MG/3ML) 0.083% nebulizer solution Take 3 mLs (2.5 mg total) by nebulization every 4 (four) hours as needed for wheezing or shortness of breath. 05/02/23  Yes Hope Almarie ORN, NP  amLODipine  (NORVASC ) 2.5 MG tablet Take 2.5  mg by mouth daily. 08/27/21  Yes [provider]  aspirin  EC 81 MG EC tablet Take 1 tablet (81 mg total) by mouth daily. Swallow whole. 10/27/20  Yes Ghimire, Donalda HERO, MD  budesonide  (PULMICORT ) 0.25 MG/2ML nebulizer solution Take 2 mLs (0.25 mg total) by nebulization in the morning and at bedtime. 05/02/23  Yes Hope Almarie ORN, NP  Cholecalciferol  (VITAMIN D  PO) Take 1,000 Units by mouth daily.   Yes [provider]  docusate sodium  (COLACE) 100 MG capsule Take 100 mg by mouth daily.   Yes [provider]  furosemide  (LASIX ) 20 MG tablet Take 1 tablet (20 mg total) by mouth daily. 03/24/22  Yes Vann, Jessica U, DO  Glycerin-Polysorbate 80 (REFRESH DRY EYE THERAPY OP) Apply 2 drops to eye as needed (dry eyes).   Yes [provider]  ipratropium-albuterol  (DUONEB) 0.5-2.5 (3) MG/3ML SOLN Take 3 mLs by nebulization 2 (two) times daily. 05/02/23  Yes Hope Almarie ORN, NP  Metoprolol  Succinate 25 MG CS24 Take 25 mg by mouth daily.   Yes [provider]  OXYGEN Inhale into the lungs as directed. 2L   Yes [provider]  pravastatin  (PRAVACHOL ) 10 MG tablet Take 10 mg by mouth at bedtime. 06/12/22  Yes [provider]     Critical care time: Not applicable      Tonnie BRAVO. Noreen, M.D. [facility] 7:33 AM 08/22/23   PCCM on call pager [phone] until 7pm. Please call Elink 7p-7a. [phone]

## 2023-08-22 NOTE — ED Notes (Signed)
 Patient now lethargic and unable to answer questions appropriately at this time. Provider notified

## 2023-08-22 NOTE — ED Notes (Signed)
 Patient agreeable to try Bipap at this time.

## 2023-08-22 NOTE — ED Notes (Signed)
 Echo at [facility], echo in progress.

## 2023-08-22 NOTE — ED Notes (Addendum)
 Alert, NAD, calm, interactive, appropriate, answering questions, following commmands, face straps adjusted for comfort, VSS, tolerating Bipap. Intermittent alarm, RT notified. NPO explained.

## 2023-08-22 NOTE — Progress Notes (Signed)
 OT Cancellation Note  Patient Details Name: ZUZU BEFORT MRN: [id number] DOB: 1932/10/09   Cancelled Treatment:    Reason Eval/Treat Not Completed: Patient not medically ready--On Bipap in ED.  Donny BECKER OT Acute Rehabilitation Services Office [phone]    [place] Dorothyann Distel 08/22/2023, 12:01 PM

## 2023-08-22 NOTE — Plan of Care (Signed)
  Problem: Clinical Measurements: Goal: Ability to maintain clinical measurements within normal limits will improve Outcome: Progressing Goal: Respiratory complications will improve Outcome: Progressing   Problem: Coping: Goal: Level of anxiety will decrease Outcome: Progressing   

## 2023-08-23 DIAGNOSIS — I214 Non-ST elevation (NSTEMI) myocardial infarction: Secondary | ICD-10-CM | POA: Diagnosis not present

## 2023-08-23 DIAGNOSIS — I5032 Chronic diastolic (congestive) heart failure: Secondary | ICD-10-CM | POA: Diagnosis not present

## 2023-08-23 DIAGNOSIS — J9601 Acute respiratory failure with hypoxia: Secondary | ICD-10-CM | POA: Diagnosis not present

## 2023-08-23 DIAGNOSIS — J441 Chronic obstructive pulmonary disease with (acute) exacerbation: Secondary | ICD-10-CM | POA: Diagnosis not present

## 2023-08-23 DIAGNOSIS — I10 Essential (primary) hypertension: Secondary | ICD-10-CM

## 2023-08-23 LAB — CBC
HCT: 42.1 % (ref 36.0–46.0)
Hemoglobin: 13.1 g/dL (ref 12.0–15.0)
MCH: 30.5 pg (ref 26.0–34.0)
MCHC: 31.1 g/dL (ref 30.0–36.0)
MCV: 98.1 fL (ref 80.0–100.0)
Platelets: 156 10*3/uL (ref 150–400)
RBC: 4.29 MIL/uL (ref 3.87–5.11)
RDW: 14.7 % (ref 11.5–15.5)
WBC: 13.4 10*3/uL — ABNORMAL HIGH (ref 4.0–10.5)
nRBC: 0 % (ref 0.0–0.2)

## 2023-08-23 LAB — BASIC METABOLIC PANEL
Anion gap: 9 (ref 5–15)
BUN: 22 mg/dL (ref 8–23)
CO2: 41 mmol/L — ABNORMAL HIGH (ref 22–32)
Calcium: 9.2 mg/dL (ref 8.9–10.3)
Chloride: 89 mmol/L — ABNORMAL LOW (ref 98–111)
Creatinine, Ser: 0.92 mg/dL (ref 0.44–1.00)
GFR, Estimated: 60 mL/min — ABNORMAL LOW (ref >60)
Glucose, Bld: 108 mg/dL — ABNORMAL HIGH (ref 70–99)
Potassium: 4.6 mmol/L (ref 3.5–5.1)
Sodium: 139 mmol/L (ref 135–145)

## 2023-08-23 LAB — HEPARIN LEVEL (UNFRACTIONATED): Heparin Unfractionated: 0.54 [IU]/mL (ref 0.30–0.70)

## 2023-08-23 LAB — BLOOD GAS, VENOUS
Acid-Base Excess: 14.7 mmol/L — ABNORMAL HIGH (ref 0.0–2.0)
Bicarbonate: 41.3 mmol/L — ABNORMAL HIGH (ref 20.0–28.0)
Drawn by: 70201
O2 Saturation: 59.1 %
Patient temperature: 36.9
pCO2, Ven: 58 mm[Hg] (ref 44–60)
pH, Ven: 7.46 — ABNORMAL HIGH (ref 7.25–7.43)
pO2, Ven: 35 mm[Hg] (ref 32–45)

## 2023-08-23 LAB — PHOSPHORUS: Phosphorus: 2.9 mg/dL (ref 2.5–4.6)

## 2023-08-23 LAB — MAGNESIUM: Magnesium: 2.2 mg/dL (ref 1.7–2.4)

## 2023-08-23 MED ORDER — PREDNISONE 20 MG PO TABS
40.0000 mg | ORAL_TABLET | Freq: Every day | ORAL | Status: AC
  Filled 2023-08-23 (×3): qty 2

## 2023-08-23 MED ORDER — PRAVASTATIN SODIUM 10 MG PO TABS
10.0000 mg | ORAL_TABLET | Freq: Every day | ORAL | Status: DC
  Administered 2023-08-23 – 2023-08-26 (×4): 10 mg via ORAL
  Filled 2023-08-23 (×4): qty 1

## 2023-08-23 MED ORDER — METOPROLOL SUCCINATE ER 25 MG PO TB24
25.0000 mg | ORAL_TABLET | Freq: Every day | ORAL | Status: DC
  Administered 2023-08-23 – 2023-08-24 (×2): 25 mg via ORAL
  Filled 2023-08-23 (×2): qty 1

## 2023-08-23 MED ORDER — ADULT MULTIVITAMIN W/MINERALS CH
1.0000 | ORAL_TABLET | Freq: Every day | ORAL | Status: DC
  Administered 2023-08-24 – 2023-08-30 (×3): 1 via ORAL
  Filled 2023-08-23 (×7): qty 1

## 2023-08-23 MED ORDER — REVEFENACIN 175 MCG/3ML IN SOLN
175.0000 ug | Freq: Every day | RESPIRATORY_TRACT | Status: DC
  Administered 2023-08-24 – 2023-08-30 (×6): 175 ug via RESPIRATORY_TRACT
  Filled 2023-08-23 (×6): qty 3

## 2023-08-23 MED ORDER — ARFORMOTEROL TARTRATE 15 MCG/2ML IN NEBU
15.0000 ug | INHALATION_SOLUTION | Freq: Two times a day (BID) | RESPIRATORY_TRACT | Status: DC
  Administered 2023-08-24 – 2023-08-30 (×11): 15 ug via RESPIRATORY_TRACT
  Filled 2023-08-23 (×14): qty 2

## 2023-08-23 NOTE — Progress Notes (Signed)
 Progress Note  Patient Name: [name] Date of Encounter: 08/23/2023 Primary Cardiologist: Victory LELON [name], MD (Inactive)   Subjective   Overnight lethargy improved; move to the floor LVEF preserved with new LAD WMA.    Patient notes that she has a new cardiologist now.  She does not believe she had a myocardial infarction.  She is worried that we are giving her IV medication.  Wants to leave AMA.  Vital Signs    Vitals:   08/23/23 0007 08/23/23 0331 08/23/23 0400 08/23/23 0424  BP: (!) 110/52 (!) 93/59 117/79   Pulse: 82 96  (!) 102  Resp: 20 20  19   Temp: 98.2 F (36.8 C) 98 F (36.7 C)    TempSrc: Axillary Oral    SpO2: 95% 91% 94% 95%  Weight:      Height:        Intake/Output Summary (Last 24 hours) at 08/23/2023 0936 Last data filed at 08/23/2023 0415 Gross per 24 hour  Intake --  Output 1300 ml  Net -1300 ml   Filed Weights   08/21/23 1723  Weight: 90.7 kg    Physical Exam   GEN: Mild istress.   Neck: No JVD at 90 degrees Cardiac: regular tachycardia, no murmurs Respiratory: Crackles in the bases, decreased breath sounds in the bases GI: Soft, nontender, non-distended  MS: No edema  Labs   Telemetry: Sinus tachycardia with artifact   Chemistry Recent Labs  Lab 08/22/23 1603 08/22/23 1845 08/23/23 0624  NA 139 140 139  K 5.4* 5.0 4.6  CL 93* 91* 89*  CO2 31 41* 41*  GLUCOSE 105* 191* 108*  BUN 22 21 22   CREATININE 0.93 1.15* 0.92  CALCIUM  8.8* 9.0 9.2  PROT 6.6  --   --   ALBUMIN 3.5  --   --   AST 108*  --   --   ALT 22  --   --   ALKPHOS 52  --   --   BILITOT 1.1  --   --   GFRNONAA 59* 46* 60*  ANIONGAP 15 8 9      Hematology Recent Labs  Lab 08/21/23 2245 08/22/23 0500 08/23/23 0624  WBC 9.7 8.8 13.4*  RBC 4.55 4.85 4.29  HGB 13.8 14.8 13.1  HCT 45.8 49.2* 42.1  MCV 100.7* 101.4* 98.1  MCH 30.3 30.5 30.5  MCHC 30.1 30.1 31.1  RDW 14.6 14.8 14.7  PLT 144* 169 156    Cardiac EnzymesNo results for input(s):  TROPONINI in the last 168 hours. No results for input(s): TROPIPOC in the last 168 hours.   BNP Recent Labs  Lab 08/21/23 1727  BNP 323.7*     DDimer  Recent Labs  Lab 08/22/23 0500  DDIMER 0.76*     Cardiac Studies   Cardiac Studies & Procedures     STRESS TESTS  NM MYOCAR MULTI W/SPECT W 09/04/1999  ECHOCARDIOGRAM  ECHOCARDIOGRAM COMPLETE 08/22/2023  Narrative ECHOCARDIOGRAM REPORT    Patient Name:   [name] Date of Exam: 08/22/2023 Medical Rec #:  [id number]   Height:       59.0 in Accession #:    [id number]  Weight:       200.0 lb Date of Birth:  1933-01-27   BSA:          1.844 m Patient Age:    89 years    BP:           122/62 mmHg Patient Gender: F  HR:           80 bpm. Exam Location:  Inpatient  Procedure: 2D Echo, Color Doppler and Cardiac Doppler  Indications:     ABN ECG  History:         Patient has no prior history of Echocardiogram examinations. CAD, Stroke and COPD; Risk Factors:Hypertension and Dyslipidemia.  Sonographer:     Tinnie Gosling Referring Phys:  REDIA LABS MD Diagnosing Phys: Vinie Maxcy MD  IMPRESSIONS   1. Left ventricular ejection fraction, by estimation, is 50 to 55%. Left ventricular ejection fraction by PLAX is 55 %. The left ventricle has low normal function. The left ventricle has no regional wall motion abnormalities. Left ventricular diastolic parameters are consistent with Grade I diastolic dysfunction (impaired relaxation). Elevated left ventricular end-diastolic pressure. Possible apical/apical septal hypokinesis. 2. Right ventricular systolic function is normal. The right ventricular size is normal. Tricuspid regurgitation signal is inadequate for assessing PA pressure. 3. The mitral valve is abnormal. Trivial mitral valve regurgitation. Moderate to severe mitral annular calcification. 4. The aortic valve was not well visualized. Aortic valve regurgitation is not visualized. 5. The inferior vena  cava is normal in size with <50% respiratory variability, suggesting right atrial pressure of 8 mmHg.  Comparison(s): No prior Echocardiogram. Changes from prior study are noted. 03/21/2022: LVEF 60-65%.  Conclusion(s)/Recommendation(s): Recommend limited echo with Definity  contrast to better evaluate for wall motion abnormalities.  FINDINGS Left Ventricle: Left ventricular ejection fraction, by estimation, is 50 to 55%. Left ventricular ejection fraction by PLAX is 55 %. The left ventricle has low normal function. The left ventricle has no regional wall motion abnormalities. The left ventricular internal cavity size was normal in size. There is no left ventricular hypertrophy. Left ventricular diastolic parameters are consistent with Grade I diastolic dysfunction (impaired relaxation). Elevated left ventricular end-diastolic pressure.   LV Wall Scoring: Possible apical/apical septal hypokinesis.  Right Ventricle: The right ventricular size is normal. No increase in right ventricular wall thickness. Right ventricular systolic function is normal. Tricuspid regurgitation signal is inadequate for assessing PA pressure.  Left Atrium: Left atrial size was normal in size.  Right Atrium: Right atrial size was normal in size.  Pericardium: There is no evidence of pericardial effusion.  Mitral Valve: The mitral valve is abnormal. Moderate to severe mitral annular calcification. Trivial mitral valve regurgitation.  Tricuspid Valve: The tricuspid valve is not well visualized. Tricuspid valve regurgitation is not demonstrated.  Aortic Valve: The aortic valve was not well visualized. Aortic valve regurgitation is not visualized.  Pulmonic Valve: The pulmonic valve was normal in structure. Pulmonic valve regurgitation is not visualized.  Aorta: The aortic root and ascending aorta are structurally normal, with no evidence of dilitation.  Venous: The inferior vena cava is normal in size with less than  50% respiratory variability, suggesting right atrial pressure of 8 mmHg.  IAS/Shunts: No atrial level shunt detected by color flow Doppler.   LEFT VENTRICLE PLAX 2D LV EF:         Left            Diastology ventricular     LV e' medial:    4.90 cm/s ejection        LV E/e' medial:  18.1 fraction by     LV e' lateral:   6.31 cm/s PLAX is 55      LV E/e' lateral: 14.0 %. LVIDd:         5.20 cm LVIDs:  3.70 cm LV PW:         1.00 cm LV IVS:        0.70 cm LVOT diam:     1.90 cm LV SV:         50 LV SV Index:   27 LVOT Area:     2.84 cm   RIGHT VENTRICLE             IVC RV S prime:     12.50 cm/s  IVC diam: 2.00 cm TAPSE (M-mode): 2.0 cm  LEFT ATRIUM             Index        RIGHT ATRIUM           Index LA diam:        3.00 cm 1.63 cm/m   RA Area:     14.10 cm LA Vol (A2C):   53.9 ml 29.23 ml/m  RA Volume:   34.90 ml  18.93 ml/m LA Vol (A4C):   62.2 ml 33.73 ml/m LA Biplane Vol: 59.5 ml 32.27 ml/m AORTIC VALVE LVOT Vmax:   82.20 cm/s LVOT Vmean:  52.500 cm/s LVOT VTI:    0.176 m  AORTA Ao Root diam: 2.20 cm Ao Asc diam:  2.60 cm  MITRAL VALVE MV Area (PHT): 4.41 cm     SHUNTS MV Decel Time: 172 msec     Systemic VTI:  0.18 m MV E velocity: 88.50 cm/s   Systemic Diam: 1.90 cm MV A velocity: 118.00 cm/s MV E/A ratio:  0.75  Vinie Maxcy MD Electronically signed by Vinie Maxcy MD Signature Date/Time: 08/22/2023/4:07:27 PM    Final (Updated)                Assessment & Plan   NSTEMI   - ASA and heparin  for 48 hours -return statin - she does not believe she has an MI.  Does not believe LAD WMA or troponin elevation 155, was not amenable for further checks which is why they were not done - declined LHC  Hypoxic Respiratory Failure   Multi-factorial Heart Failure with Preserved Ejection Fraction (HFpEF)  : Continue IV lasix   Hypercapnea with Chronic Obstructive Pulmonary Disease (COPD) : chronically on 2L oxygen; mgmt as per  TRH  Hyperlipidemia   - statin    Prior Cerebrovascular Accident (CVA)   - statin    Goals of Care   DNR/DNI status. Does not wish to follow with me because I discussed NSTEMI; would prefer to follow up with her new cardiologist   For questions or updates, please contact [facility] Please consult [email or web address] for contact info under Cardiology/STEMI.      Stanly Leavens, MD FASE [facility] Cardiologist [facility]  [address] [place], [address] [place], [place] [address] [phone]  9:36 AM

## 2023-08-23 NOTE — Consult Note (Signed)
 NAME:  [name], MRN:  [id number], DOB:  [birth year], LOS: 1 ADMISSION DATE:  08/21/2023, CONSULTATION DATE:  1/9 REFERRING MD:  Dr. Franky, CHIEF COMPLAINT:  Shortness of Breath  History of Present Illness:  89 year old female with past medical history as below, which is significant for heart failure with preserved ejection fraction, hypertension, hyperlipidemia, stroke, COPD, and chronic respiratory failure with hypoxia on 2 L.  Sounds like she is not very compliant with her home oxygen.  On 1/9 she had a PCP appointment during which she had a coughing spell and became hypoxic.  She was sent to the emergency department and upon arrival was found to be short of breath with cough.  She was treated for COPD exacerbation with nebulized bronchodilators, IV steroids, and azithromycin .  She developed chest pain at some point during the ED stay and EKG was done showing global ST depressions.  Cardiology was consulted and recommended nitroglycerin  and heparin .  Troponins were mildly positive.  Respiratory status initially stabilized on nasal cannula, however, it did end up worsening to the point she was requiring BiPAP. ABG showed a severe respiratory acidosis with CO2 > 100. PCCM was asked to evaluate.   Pertinent  Medical History   Past Medical History:  Diagnosis Date   Acute back pain with sciatica, unspecified laterality    Allergic rhinitis    BMI 40.0-44.9, adult (HCC)    Carotid artery disease (HCC) 05/11/2022   Carotid US  02/2022: Bilateral ICA 40-59   Cerebrovascular disease    Chronic diastolic (congestive) heart failure (HCC)    Colon polyp    COPD (chronic obstructive pulmonary disease) (HCC)    MILD   Disorder of bone    Estrogen deficiency    Excessive sleepiness    Hyperlipidemia    Hypertension    Incontinence of urine    Irregular heartbeat    Low back pain    Morbid obesity (HCC)    OA (osteoarthritis)    OF THE KNEES   OAB (overactive bladder)    Osteopenia     Osteopenia    Primary osteoarthritis of both knees    Pure hypercholesterolemia    Shingles    Shingles    Stroke (HCC)    Thyroid  cyst    Thyroid  cyst    Ulnar neuropathy of both upper extremities    Vertigo     Significant Hospital Events: Including procedures, antibiotic start and stop dates in addition to other pertinent events   01/09 Admit to Hospitalist >> PCCM consult w/ worsening resp failure requiring NIV  Interim History / Subjective:  N/A  Objective   Blood pressure 121/60, pulse (!) 111, temperature 98.3 F (36.8 C), temperature source Oral, resp. rate 20, height 4' 11 (1.499 m), weight 90.7 kg, SpO2 93%.    FiO2 (%):  [40 %] 40 % PEEP:  [8 cmH20] 8 cmH20 Pressure Support:  [10 cmH20] 10 cmH20   Intake/Output Summary (Last 24 hours) at 08/23/2023 1101 Last data filed at 08/23/2023 0415 Gross per 24 hour  Intake --  Output 1300 ml  Net -1300 ml   Filed Weights   08/21/23 1723  Weight: 90.7 kg    Examination: Elderly female is obese and not very happy with the [place] [facility] system at this time. She is currently on 2 L nasal cannula with sats of 89% she is remarkable for pursed lip breathing. Diminished breath sounds throughout faint crackles in the bases Heart sounds are regular currently on heparin   drip Abdomen obese soft nontender positive bowel sounds Extremities warm with 2+ edema note she is -1300 cc per 24 hours  Resolved Hospital Problem list   N/A  Assessment & Plan:  89 y.o. female presenting with acute on chronic hypoxic and hypercarbic respiratory failure.  Patient notably does not consistently use her home oxygen.  Clinical picture seems consistent with volume overload as a result of acute on chronic [place] congestive heart failure.  Respiratory status has worsened since presenting to the emergency department such that she has required initiation of noninvasive mechanical ventilation.  I cannot rule out an acute exacerbation of COPD at this time  either.  CODE STATUS confirmed as DNR/DNI.  1.  Acute on chronic respiratory failure with hypoxic: Currently on 2 L nasal cannula with O2 sats of 89 to 92%.  She did not tolerate BiPAP during the night.  She is notable for pursed lip breathing but is not in acute distress.  2.  Acute on chronic hypercarbic respiratory failure: Most likely secondary to COPD with acute exacerbation.  Questionable underlying OSA Consider sleep study in the future Is not tolerating BiPAP at this time RSV panel is negative  3.  Non-STEMI: Likely secondary to acute on chronic congestive heart failure.   Cardiology is following  4.  Acute on chronic congestive heart failure:  Diuresis as tolerated  PCCM will be available as needed as she seems to be improving with diuresis.  Currently in stepdown unit without acute distress  Remainder of cares per primary service and other consultants.  Best Practice (right click and Reselect all SmartList Selections daily)   Per primary  Labs   CBC: Recent Labs  Lab 08/21/23 2245 08/22/23 0500 08/23/23 0624  WBC 9.7 8.8 13.4*  NEUTROABS 9.2*  --   --   HGB 13.8 14.8 13.1  HCT 45.8 49.2* 42.1  MCV 100.7* 101.4* 98.1  PLT 144* 169 156    Basic Metabolic Panel: Recent Labs  Lab 08/21/23 1727 08/22/23 1603 08/22/23 1845 08/23/23 0624  NA 141 139 140 139  K 4.4 5.4* 5.0 4.6  CL 94* 93* 91* 89*  CO2 37* 31 41* 41*  GLUCOSE 111* 105* 191* 108*  BUN 22 22 21 22   CREATININE 0.72 0.93 1.15* 0.92  CALCIUM  9.2 8.8* 9.0 9.2  MG  --   --   --  2.2  PHOS  --   --   --  2.9   GFR: Estimated Creatinine Clearance: 41.5 mL/min (by C-G formula based on SCr of 0.92 mg/dL). Recent Labs  Lab 08/21/23 2245 08/22/23 0500 08/22/23 1845 08/23/23 0624  PROCALCITON  --   --  <0.10  --   WBC 9.7 8.8  --  13.4*    Liver Function Tests: Recent Labs  Lab 08/22/23 1603  AST 108*  ALT 22  ALKPHOS 52  BILITOT 1.1  PROT 6.6  ALBUMIN 3.5   No results for  input(s): LIPASE, AMYLASE in the last 168 hours. No results for input(s): AMMONIA in the last 168 hours.  ABG    Component Value Date/Time   PHART 7.181 (LL) 03/19/2022 1918   PCO2ART 100.1 (HH) 03/19/2022 1918   PO2ART 89 03/19/2022 1918   HCO3 41.3 (H) 08/23/2023 0624   TCO2 43 (H) 03/19/2022 2217   O2SAT 59.1 08/23/2023 0624     Coagulation Profile: No results for input(s): INR, PROTIME in the last 168 hours.  Cardiac Enzymes: No results for input(s): CKTOTAL, CKMB, CKMBINDEX, TROPONINI in  the last 168 hours.  HbA1C: Hgb A1c MFr Bld  Date/Time Value Ref Range Status  08/22/2023 05:00 AM 6.1 (H) 4.8 - 5.6 % Final    Comment:    (NOTE) Pre diabetes:          5.7%-6.4%  Diabetes:              >6.4%  Glycemic control for   <7.0% adults with diabetes   03/20/2022 08:44 AM 5.6 4.8 - 5.6 % Final    Comment:    (NOTE) Pre diabetes:          5.7%-6.4%  Diabetes:              >6.4%  Glycemic control for   <7.0% adults with diabetes     CBG: Recent Labs  Lab 08/22/23 1621  GLUCAP 106*    IMAGING: PORTABLE CXR 08/22/23 (personally reviewed by me): Bilateral hazy basilar opacities consistent with consolidation versus atelectasis with likely pleural effusions.  Trachea midline.  Mediastinal contour normal.  Cardiomegaly suggested.  No pneumothorax.     Marcey Krystie Leiter ACNP Acute Care Nurse Practitioner [facility] Please consult Amion 08/23/2023, 11:02 AM

## 2023-08-23 NOTE — Plan of Care (Signed)

## 2023-08-23 NOTE — Progress Notes (Signed)
 OT Cancellation Note  Patient Details Name: [name] MRN: [id number] DOB: 08-30-1932   Cancelled Treatment:    Reason Eval/Treat Not Completed: Patient declined, no reason specified (Attempted to follow up with pt for second time today, continues to refuse therapy and declines attempts of transfers/mobilization. OT to follow-up with pt as able)  08/23/2023  AB, OTR/L  Acute Rehabilitation Services  Office: [phone]   [name] 08/23/2023, 4:32 PM

## 2023-08-23 NOTE — Progress Notes (Signed)
 PT Cancellation Note  Patient Details Name: [name] MRN: [id number] DOB: [birth year]   Cancelled Treatment:    Reason Eval/Treat Not Completed: Patient declined, no reason specified (I'm not doing that today. Despite encouragement pt still adamently refused. Will follow up tomorrow.)   Kerington Hildebrant 08/23/2023, 2:26 PM

## 2023-08-23 NOTE — Progress Notes (Signed)
 Initial Nutrition Assessment  DOCUMENTATION CODES:   Morbid obesity  INTERVENTION:  Liberalize diet   NUTRITION DIAGNOSIS:   Increased nutrient needs related to chronic illness as evidenced by estimated needs.    GOAL:   Patient will meet greater than or equal to 90% of their needs    MONITOR:   PO intake  REASON FOR ASSESSMENT:   Consult Assessment of nutrition requirement/status  ASSESSMENT:  89 y.o. f presented to ED  with complaints of increasing shortness of breath.  Patient was refereed to ED from PCP office after being found to be SOB and hypoxic. Admitting with principal problem of acute respiratory failure with hypoxia. PMH: HTN, HLD, Shingles, COPD, Stroke, Morbid obesity, OAB, CHF, CAD. Patient stated that she has not had breakfast yet today.  Stated she had a dry turkey sandwich yesterday and meatloaf for dinner. She denies any appetite changes or weight loss.  Said they didn't let her leave she was going to leave on own.  No chewing or swallowing concerns noted. She was eating mustard packages said it kept her legs from cramping. Requested mustard on tray.  There is no benefit to excessive dietary restrictions related advanced age, increased nutrient needs. Patient would benefit better from liberalized diet to help help meet increased needs and promote better oral intake.  Admit weight: 90.7 kg Weight history: 08/21/23 90.7 kg  05/02/23 95.5 kg  10/01/22 90.3 kg  08/22/22 91.2 kg    Average Meal Intake: No current documentation  Nutritionally Relevant Medications: Scheduled Meds:  cholecalciferol   1,000 Units Oral Daily   docusate sodium   100 mg Oral Daily   furosemide   80 mg Intravenous BID    Labs Reviewed:  CBG ranges from 111-101 mg/dL over the last 24 hours HgbA1c 6.1(08/22/23)    NUTRITION - FOCUSED PHYSICAL EXAM:  Flowsheet Row Most Recent Value  Orbital Region No depletion  Upper Arm Region No depletion  Thoracic and Lumbar Region No  depletion  Buccal Region No depletion  Temple Region No depletion  Clavicle Bone Region No depletion  Clavicle and Acromion Bone Region No depletion  Scapular Bone Region No depletion  Dorsal Hand No depletion  Patellar Region No depletion  Anterior Thigh Region No depletion  Posterior Calf Region No depletion  Edema (RD Assessment) None  Hair Reviewed  Eyes Reviewed  Mouth Reviewed  Skin Reviewed  Nails Reviewed       Diet Order:   Diet Order             Diet Heart Room service appropriate? Yes; Fluid consistency: Thin; Fluid restriction: 1500 mL Fluid  Diet effective now                   EDUCATION NEEDS:   No education needs have been identified at this time  Skin:  Skin Assessment: Reviewed RN Assessment  Last BM:  PTA  Height:   Ht Readings from Last 1 Encounters:  08/21/23 4' 11 (1.499 m)    Weight:   Wt Readings from Last 1 Encounters:  08/21/23 90.7 kg    Ideal Body Weight:     BMI:  Body mass index is 40.4 kg/m.  Estimated Nutritional Needs:   Kcal:  1600-1818 kcal  Protein:  60-75 g  Fluid:  23ml/kcal

## 2023-08-23 NOTE — Progress Notes (Signed)
 PROGRESS NOTE        PATIENT DETAILS Name: [name] Age: 89 y.o. Sex: female Date of Birth: Jan 10, 1933 Admit Date: 08/21/2023 Admitting Physician Redia LOISE Cleaver, MD ERE:[id number], [name], MD  Brief Summary: Patient is a 89 y.o.  female with history of COPD on 2 L of oxygen at home, chronic HFpEF, HTN, HLD, CVA-who presented with shortness of breath-thought to be due to flash pulm edema-post admission patient became somnolent and was found to have hypercarbia.  She was also found to have possible non-STEMI.  She was placed on BiPAP-IV heparin -IV Lasix  -bronchodilators-and has clinically improved.  See below for further details.  Significant events: 1/8>> admit to [facility]-short of breath-hypoxic-likely from flash pulm edema 1/9 >> somnolent-hypercarbia on ABG-BiPAP  1/10>> back on 2 L of oxygen  Significant studies: 1/8>> CXR: Central vascular congestion 1/9>> CXR: Increased interstitial edema 1/9>> bilateral lower extremity Doppler: No DVT 1/9>> echo: EF 50-55%, grade 1 diastolic dysfunction.  Significant microbiology data: 1/8>> COVID/influenza/RSV PCR: Negative 1/9>> respiratory virus panel: Negative  Procedures: None  Consults: Cardiology Pulmonology  Subjective: Much better-asking to be discharged-no family at bedside.  Objective: Vitals: Blood pressure 117/79, pulse (!) 102, temperature 98 F (36.7 C), temperature source Oral, resp. rate 19, height 4' 11 (1.499 m), weight 90.7 kg, SpO2 95%.   Exam: Gen Exam:Alert awake-not in any distress HEENT:atraumatic, normocephalic Chest: Few bibasilar rales-hardly any rhonchi. CVS:S1S2 regular Abdomen:soft non tender, non distended Extremities:+ edema Neurology: Non focal Skin: no rash  Pertinent Labs/Radiology:    Latest Ref Rng & Units 08/23/2023    6:24 AM 08/22/2023    5:00 AM 08/21/2023   10:45 PM  CBC  WBC 4.0 - 10.5 K/uL 13.4  8.8  9.7   Hemoglobin 12.0 - 15.0 g/dL 86.8  85.1   86.1   Hematocrit 36.0 - 46.0 % 42.1  49.2  45.8   Platelets 150 - 400 K/uL 156  169  144     Lab Results  Component Value Date   NA 139 08/23/2023   K 4.6 08/23/2023   CL 89 (L) 08/23/2023   CO2 41 (H) 08/23/2023      Assessment/Plan: Acute metabolic encephalopathy Secondary to hypercarbia/hypoxia Markedly better after treatment of underlying etiologies-completely awake and alert.  Acute on chronic hypoxic and hypercarbic respiratory failure Felt to be primarily due to flash pulm edema-COPD exacerbation may be playing a role Clinically improved with diuretics/bronchodilators/steroids-liberated of BiPAP overnight-and currently stable on 2-3 L of oxygen (home regimen)  Flash pulm edema Acute on chronic HFpEF Overall improved with diuretics Still with lower extremity edema-and few bibasilar rales Continue IV Lasix   Non-STEMI No chest pain-but potentially causing above On aspirin /IV heparin  Resume low-dose beta-blocker/statin Cardiology following-LHC being contemplated when clinically more stable  HTN BP stable Resuming low-dose beta-blocker Hold amlodipine  for now Follow/optimize  COPD exacerbation Improved Continue bronchodilators Transition Solu-Medrol  to prednisone -quick taper planned.  Prior history of CVA No residual deficits  aspirin /statin  Morbid Obesity: Estimated body mass index is 40.4 kg/m as calculated from the following:   Height as of this encounter: 4' 11 (1.499 m).   Weight as of this encounter: 90.7 kg.   Code status:   Code Status: Limited: Do not attempt resuscitation (DNR) -DNR-LIMITED -Do Not Intubate/DNI    DVT Prophylaxis:IV heparin     Family Communication:Sister-[name]  [phone] updated  over the phone on 1/10   Disposition Plan: Status is: Inpatient Remains inpatient appropriate because: Severity of illness   Planned Discharge Destination:Home health   Diet: Diet Order             Diet Heart Room service  appropriate? Yes; Fluid consistency: Thin; Fluid restriction: 1500 mL Fluid  Diet effective now                     Antimicrobial agents: Anti-infectives (From admission, onward)    Start     Dose/Rate Route Frequency Ordered Stop   08/22/23 0245  cefTRIAXone  (ROCEPHIN ) 1 g in sodium chloride  0.9 % 100 mL IVPB        1 g 200 mL/hr over 30 Minutes Intravenous Daily at bedtime 08/22/23 0232 08/26/23 2159   08/21/23 1745  azithromycin  (ZITHROMAX ) 500 mg in sodium chloride  0.9 % 250 mL IVPB        500 mg 250 mL/hr over 60 Minutes Intravenous  Once 08/21/23 1734 08/21/23 1920        MEDICATIONS: Scheduled Meds:  aspirin  EC  81 mg Oral Daily   budesonide   0.25 mg Nebulization BID   cholecalciferol   1,000 Units Oral Daily   docusate sodium   100 mg Oral Daily   furosemide   80 mg Intravenous BID   ipratropium-albuterol   3 mL Nebulization BID   methylPREDNISolone  (SOLU-MEDROL ) injection  40 mg Intravenous Q12H   Continuous Infusions:  cefTRIAXone  (ROCEPHIN )  IV 1 g (08/22/23 2207)   heparin  950 Units/hr (08/22/23 1325)   PRN Meds:.ipratropium-albuterol    I have personally reviewed following labs and imaging studies  LABORATORY DATA: CBC: Recent Labs  Lab 08/21/23 2245 08/22/23 0500 08/23/23 0624  WBC 9.7 8.8 13.4*  NEUTROABS 9.2*  --   --   HGB 13.8 14.8 13.1  HCT 45.8 49.2* 42.1  MCV 100.7* 101.4* 98.1  PLT 144* 169 156    Basic Metabolic Panel: Recent Labs  Lab 08/21/23 1727 08/22/23 1603 08/22/23 1845 08/23/23 0624  NA 141 139 140 139  K 4.4 5.4* 5.0 4.6  CL 94* 93* 91* 89*  CO2 37* 31 41* 41*  GLUCOSE 111* 105* 191* 108*  BUN 22 22 21 22   CREATININE 0.72 0.93 1.15* 0.92  CALCIUM  9.2 8.8* 9.0 9.2  MG  --   --   --  2.2  PHOS  --   --   --  2.9    GFR: Estimated Creatinine Clearance: 41.5 mL/min (by C-G formula based on SCr of 0.92 mg/dL).  Liver Function Tests: Recent Labs  Lab 08/22/23 1603  AST 108*  ALT 22  ALKPHOS 52  BILITOT 1.1   PROT 6.6  ALBUMIN 3.5   No results for input(s): LIPASE, AMYLASE in the last 168 hours. No results for input(s): AMMONIA in the last 168 hours.  Coagulation Profile: No results for input(s): INR, PROTIME in the last 168 hours.  Cardiac Enzymes: No results for input(s): CKTOTAL, CKMB, CKMBINDEX, TROPONINI in the last 168 hours.  BNP (last 3 results) No results for input(s): PROBNP in the last 8760 hours.  Lipid Profile: Recent Labs    08/22/23 0500 08/22/23 1845  CHOL 179 161  HDL 67 56  LDLCALC 102* 96  TRIG 50 46  CHOLHDL 2.7 2.9    Thyroid  Function Tests: Recent Labs    08/22/23 0500  TSH 0.984    Anemia Panel: Recent Labs    08/22/23 1603  VITAMINB12 724  FOLATE 12.6  Urine analysis:    Component Value Date/Time   COLORURINE STRAW (A) 10/21/2020 1229   APPEARANCEUR CLEAR 10/21/2020 1229   LABSPEC 1.009 10/21/2020 1229   PHURINE 5.0 10/21/2020 1229   GLUCOSEU NEGATIVE 10/21/2020 1229   HGBUR NEGATIVE 10/21/2020 1229   BILIRUBINUR NEGATIVE 10/21/2020 1229   KETONESUR NEGATIVE 10/21/2020 1229   PROTEINUR NEGATIVE 10/21/2020 1229   UROBILINOGEN 0.2 01/27/2009 1018   NITRITE NEGATIVE 10/21/2020 1229   LEUKOCYTESUR NEGATIVE 10/21/2020 1229    Sepsis Labs: Lactic Acid, Venous No results found for: LATICACIDVEN  MICROBIOLOGY: Recent Results (from the past 240 hours)  Resp panel by RT-PCR (RSV, Flu A&B, Covid) Anterior Nasal Swab     Status: None   Collection Time: 08/21/23  5:28 PM   Specimen: Anterior Nasal Swab  Result Value Ref Range Status   SARS Coronavirus 2 by RT PCR NEGATIVE NEGATIVE Final   Influenza A by PCR NEGATIVE NEGATIVE Final   Influenza B by PCR NEGATIVE NEGATIVE Final    Comment: (NOTE) The Xpert Xpress SARS-CoV-2/FLU/RSV plus assay is intended as an aid in the diagnosis of influenza from Nasopharyngeal swab specimens and should not be used as a sole basis for treatment. Nasal washings and aspirates are  unacceptable for Xpert Xpress SARS-CoV-2/FLU/RSV testing.  Fact Sheet for Patients: bloggercourse.com  Fact Sheet for Healthcare Providers: seriousbroker.it  This test is not yet approved or cleared by the [place]  FDA and has been authorized for detection and/or diagnosis of SARS-CoV-2 by FDA under an Emergency Use Authorization (EUA). This EUA will remain in effect (meaning this test can be used) for the duration of the COVID-19 declaration under Section 564(b)(1) of the Act, 21 U.S.C. section 360bbb-3(b)(1), unless the authorization is terminated or revoked.     Resp Syncytial Virus by PCR NEGATIVE NEGATIVE Final    Comment: (NOTE) Fact Sheet for Patients: bloggercourse.com  Fact Sheet for Healthcare Providers: seriousbroker.it  This test is not yet approved or cleared by the [place]  FDA and has been authorized for detection and/or diagnosis of SARS-CoV-2 by FDA under an Emergency Use Authorization (EUA). This EUA will remain in effect (meaning this test can be used) for the duration of the COVID-19 declaration under Section 564(b)(1) of the Act, 21 U.S.C. section 360bbb-3(b)(1), unless the authorization is terminated or revoked.  Performed at [facility], [address], [place], [place] [address]   Respiratory (~20 pathogens) panel by PCR     Status: None   Collection Time: 08/22/23  6:37 AM   Specimen: Nasopharyngeal Swab; Respiratory  Result Value Ref Range Status   Adenovirus NOT DETECTED NOT DETECTED Final   Coronavirus 229E NOT DETECTED NOT DETECTED Final    Comment: (NOTE) The Coronavirus on the Respiratory Panel, DOES NOT test for the novel  Coronavirus (2019 nCoV)    Coronavirus HKU1 NOT DETECTED NOT DETECTED Final   Coronavirus NL63 NOT DETECTED NOT DETECTED Final   Coronavirus OC43 NOT DETECTED NOT DETECTED Final   Metapneumovirus NOT  DETECTED NOT DETECTED Final   Rhinovirus / Enterovirus NOT DETECTED NOT DETECTED Final   Influenza A NOT DETECTED NOT DETECTED Final   Influenza B NOT DETECTED NOT DETECTED Final   Parainfluenza Virus 1 NOT DETECTED NOT DETECTED Final   Parainfluenza Virus 2 NOT DETECTED NOT DETECTED Final   Parainfluenza Virus 3 NOT DETECTED NOT DETECTED Final   Parainfluenza Virus 4 NOT DETECTED NOT DETECTED Final   Respiratory Syncytial Virus NOT DETECTED NOT DETECTED Final   Bordetella  pertussis NOT DETECTED NOT DETECTED Final   Bordetella Parapertussis NOT DETECTED NOT DETECTED Final   Chlamydophila pneumoniae NOT DETECTED NOT DETECTED Final   Mycoplasma pneumoniae NOT DETECTED NOT DETECTED Final    Comment: Performed at [facility], [address], [place], [place] [address]    RADIOLOGY STUDIES/RESULTS: VAS US  LOWER EXTREMITY VENOUS (DVT) (ONLY [facility] & [facility]) Result Date: 08/22/2023  Lower Venous DVT Study Patient Name:  Caretha M Gillum  Date of Exam:   08/22/2023 Medical Rec #: [id number]      Accession #:    [id number] Date of Birth: November 14, 1932      Patient Gender: F Patient Age:   57 years Exam Location:  [facility] Procedure:      VAS US  LOWER EXTREMITY VENOUS (DVT) Referring Phys: REDIA CLEAVER --------------------------------------------------------------------------------  Indications: Swelling, and Edema.  Risk Factors: Obesity and past pregnancy. Limitations: Body habitus. Comparison Study: No changes seen since previous exam 10/21/20. Performing Technologist: Garnette Rockers  Examination Guidelines: A complete evaluation includes B-mode imaging, spectral Doppler, color Doppler, and power Doppler as needed of all accessible portions of each vessel. Bilateral testing is considered an integral part of a complete examination. Limited examinations for reoccurring indications may be performed as noted. The reflux portion of the exam is performed with the patient in reverse Trendelenburg.   +---------+---------------+---------+-----------+----------+--------------+ RIGHT    CompressibilityPhasicitySpontaneityPropertiesThrombus Aging +---------+---------------+---------+-----------+----------+--------------+ CFV      Full           Yes      Yes                                 +---------+---------------+---------+-----------+----------+--------------+ SFJ      Full                                                        +---------+---------------+---------+-----------+----------+--------------+ FV Prox  Full                                                        +---------+---------------+---------+-----------+----------+--------------+ FV Mid   Full                                                        +---------+---------------+---------+-----------+----------+--------------+ FV DistalFull                                                        +---------+---------------+---------+-----------+----------+--------------+ PFV      Full                                                        +---------+---------------+---------+-----------+----------+--------------+  POP      Full           Yes      Yes                                 +---------+---------------+---------+-----------+----------+--------------+ PTV      Full                                                        +---------+---------------+---------+-----------+----------+--------------+ PERO     Full                                                        +---------+---------------+---------+-----------+----------+--------------+   +---------+---------------+---------+-----------+----------+--------------+ LEFT     CompressibilityPhasicitySpontaneityPropertiesThrombus Aging +---------+---------------+---------+-----------+----------+--------------+ CFV      Full           Yes      Yes                                  +---------+---------------+---------+-----------+----------+--------------+ SFJ      Full                                                        +---------+---------------+---------+-----------+----------+--------------+ FV Prox  Full                                                        +---------+---------------+---------+-----------+----------+--------------+ FV Mid   Full                                                        +---------+---------------+---------+-----------+----------+--------------+ FV DistalFull                                                        +---------+---------------+---------+-----------+----------+--------------+ PFV      Full                                                        +---------+---------------+---------+-----------+----------+--------------+ POP      Full           Yes      Yes                                 +---------+---------------+---------+-----------+----------+--------------+  PTV      Full                                                        +---------+---------------+---------+-----------+----------+--------------+ PERO     Full                                                        +---------+---------------+---------+-----------+----------+--------------+     Summary: BILATERAL: - No evidence of deep vein thrombosis seen in the lower extremities, bilaterally. -No evidence of popliteal cyst, bilaterally.   *See table(s) above for measurements and observations. Electronically signed by Norman Serve on 08/22/2023 at 4:50:58 PM.    Final    ECHOCARDIOGRAM COMPLETE Result Date: 08/22/2023    ECHOCARDIOGRAM REPORT   Patient Name:   Chellsie Rockford Date of Exam: 08/22/2023 Medical Rec #:  [id number]   Height:       59.0 in Accession #:    [id number]  Weight:       200.0 lb Date of Birth:  09-20-1932   BSA:          1.844 m Patient Age:    89 years    BP:           122/62 mmHg Patient Gender: F           HR:            80 bpm. Exam Location:  Inpatient Procedure: 2D Echo, Color Doppler and Cardiac Doppler Indications:     ABN ECG  History:         Patient has no prior history of Echocardiogram examinations.                  CAD, Stroke and COPD; Risk Factors:Hypertension and                  Dyslipidemia.  Sonographer:     Tinnie Gosling Referring Phys:  REDIA LABS MD Diagnosing Phys: Vinie Maxcy MD IMPRESSIONS  1. Left ventricular ejection fraction, by estimation, is 50 to 55%. Left ventricular ejection fraction by PLAX is 55 %. The left ventricle has low normal function. The left ventricle has no regional wall motion abnormalities. Left ventricular diastolic parameters are consistent with Grade I diastolic dysfunction (impaired relaxation). Elevated left ventricular end-diastolic pressure. Possible apical/apical septal hypokinesis.  2. Right ventricular systolic function is normal. The right ventricular size is normal. Tricuspid regurgitation signal is inadequate for assessing PA pressure.  3. The mitral valve is abnormal. Trivial mitral valve regurgitation. Moderate to severe mitral annular calcification.  4. The aortic valve was not well visualized. Aortic valve regurgitation is not visualized.  5. The inferior vena cava is normal in size with <50% respiratory variability, suggesting right atrial pressure of 8 mmHg. Comparison(s): No prior Echocardiogram. Changes from prior study are noted. 03/21/2022: LVEF 60-65%. Conclusion(s)/Recommendation(s): Recommend limited echo with Definity  contrast to better evaluate for wall motion abnormalities. FINDINGS  Left Ventricle: Left ventricular ejection fraction, by estimation, is 50 to 55%. Left ventricular ejection fraction by PLAX is 55 %. The left ventricle has low normal function. The left ventricle has no regional wall motion abnormalities. The  left ventricular internal cavity size was normal in size. There is no left ventricular hypertrophy. Left ventricular diastolic  parameters are consistent with Grade I diastolic dysfunction (impaired relaxation). Elevated left ventricular end-diastolic pressure.  LV Wall Scoring: Possible apical/apical septal hypokinesis. Right Ventricle: The right ventricular size is normal. No increase in right ventricular wall thickness. Right ventricular systolic function is normal. Tricuspid regurgitation signal is inadequate for assessing PA pressure. Left Atrium: Left atrial size was normal in size. Right Atrium: Right atrial size was normal in size. Pericardium: There is no evidence of pericardial effusion. Mitral Valve: The mitral valve is abnormal. Moderate to severe mitral annular calcification. Trivial mitral valve regurgitation. Tricuspid Valve: The tricuspid valve is not well visualized. Tricuspid valve regurgitation is not demonstrated. Aortic Valve: The aortic valve was not well visualized. Aortic valve regurgitation is not visualized. Pulmonic Valve: The pulmonic valve was normal in structure. Pulmonic valve regurgitation is not visualized. Aorta: The aortic root and ascending aorta are structurally normal, with no evidence of dilitation. Venous: The inferior vena cava is normal in size with less than 50% respiratory variability, suggesting right atrial pressure of 8 mmHg. IAS/Shunts: No atrial level shunt detected by color flow Doppler.  LEFT VENTRICLE PLAX 2D LV EF:         Left            Diastology                ventricular     LV e' medial:    4.90 cm/s                ejection        LV E/e' medial:  18.1                fraction by     LV e' lateral:   6.31 cm/s                PLAX is 55      LV E/e' lateral: 14.0                %. LVIDd:         5.20 cm LVIDs:         3.70 cm LV PW:         1.00 cm LV IVS:        0.70 cm LVOT diam:     1.90 cm LV SV:         50 LV SV Index:   27 LVOT Area:     2.84 cm  RIGHT VENTRICLE             IVC RV S prime:     12.50 cm/s  IVC diam: 2.00 cm TAPSE (M-mode): 2.0 cm LEFT ATRIUM             Index         RIGHT ATRIUM           Index LA diam:        3.00 cm 1.63 cm/m   RA Area:     14.10 cm LA Vol (A2C):   53.9 ml 29.23 ml/m  RA Volume:   34.90 ml  18.93 ml/m LA Vol (A4C):   62.2 ml 33.73 ml/m LA Biplane Vol: 59.5 ml 32.27 ml/m  AORTIC VALVE LVOT Vmax:   82.20 cm/s LVOT Vmean:  52.500 cm/s LVOT VTI:    0.176 m  AORTA Ao Root diam: 2.20 cm Ao Asc diam:  2.60 cm MITRAL VALVE MV Area (PHT): 4.41 cm     SHUNTS MV Decel Time: 172 msec     Systemic VTI:  0.18 m MV E velocity: 88.50 cm/s   Systemic Diam: 1.90 cm MV A velocity: 118.00 cm/s MV E/A ratio:  0.75 Vinie Maxcy MD Electronically signed by Vinie Maxcy MD Signature Date/Time: 08/22/2023/4:07:27 PM    Final (Updated)    DG CHEST PORT 1 VIEW Result Date: 08/22/2023 CLINICAL DATA:  [id number] with shortness of breath. EXAM: PORTABLE CHEST 1 VIEW COMPARISON:  Portable chest 08/21/2023 at 5:41. FINDINGS: 5:11 a.m. The heart is enlarged. There are increased bilateral small layering pleural effusions partially obscuring the lung bases with overlying hazy interstitial consolidation/atelectasis. There is increased central vascular distension, and mild generalized increased interstitial consolidation consistent with interstitial edema. The mediastinum is normally outlined. The aortic arch is heavily calcified. Osteopenia. Thoracic spondylosis. IMPRESSION: 1. Increased bilateral small layering pleural effusions with overlying hazy interstitial consolidation/atelectasis. 2. Increased interstitial consolidation consistent with interstitial edema. 3. Cardiomegaly. 4. Aortic atherosclerosis. Electronically Signed   By: Francis Quam M.D.   On: 08/22/2023 05:45   DG Chest Port 1 View Result Date: 08/21/2023 CLINICAL DATA:  Shortness of breath EXAM: PORTABLE CHEST 1 VIEW COMPARISON:  03/20/2022 FINDINGS: Cardiac shadow is stable. Aortic calcifications are noted. Mild central vascular congestion is noted without significant edema. No focal infiltrate is seen. No bony  abnormality is noted. IMPRESSION: Mild central vascular congestion without significant edema. Electronically Signed   By: Oneil Devonshire M.D.   On: 08/21/2023 19:03     LOS: 1 day   Donalda Applebaum, MD  [facility]    To contact the attending provider between 7A-7P or the covering provider during after hours 7P-7A, please log into the web site [email or web address] and access using universal [place] password for that web site. If you do not have the password, please call the hospital operator.  08/23/2023, 10:28 AM

## 2023-08-23 NOTE — Progress Notes (Signed)
 OT Cancellation Note  Patient Details Name: [name] MRN: [id number] DOB: April 02, 1933   Cancelled Treatment:    Reason Eval/Treat Not Completed: Patient declined, no reason specified (Unsure of full pt reasoning, initially stated that she was a mess but declined need to be cleaned up and declined need for RN to come assist. Pt eventually stated that she just cannot get OOB right now. OT will f/u with pt as able)  08/23/2023  AB, OTR/L  Acute Rehabilitation Services  Office: [phone]   [name] 08/23/2023, 9:26 AM

## 2023-08-24 DIAGNOSIS — J9601 Acute respiratory failure with hypoxia: Secondary | ICD-10-CM | POA: Diagnosis not present

## 2023-08-24 DIAGNOSIS — I214 Non-ST elevation (NSTEMI) myocardial infarction: Secondary | ICD-10-CM | POA: Diagnosis not present

## 2023-08-24 DIAGNOSIS — I5032 Chronic diastolic (congestive) heart failure: Secondary | ICD-10-CM | POA: Diagnosis not present

## 2023-08-24 DIAGNOSIS — J441 Chronic obstructive pulmonary disease with (acute) exacerbation: Secondary | ICD-10-CM | POA: Diagnosis not present

## 2023-08-24 LAB — BASIC METABOLIC PANEL
Anion gap: 10 (ref 5–15)
BUN: 26 mg/dL — ABNORMAL HIGH (ref 8–23)
CO2: 43 mmol/L — ABNORMAL HIGH (ref 22–32)
Calcium: 8.8 mg/dL — ABNORMAL LOW (ref 8.9–10.3)
Chloride: 88 mmol/L — ABNORMAL LOW (ref 98–111)
Creatinine, Ser: 1.06 mg/dL — ABNORMAL HIGH (ref 0.44–1.00)
GFR, Estimated: 51 mL/min — ABNORMAL LOW (ref >60)
Glucose, Bld: 105 mg/dL — ABNORMAL HIGH (ref 70–99)
Potassium: 3.5 mmol/L (ref 3.5–5.1)
Sodium: 141 mmol/L (ref 135–145)

## 2023-08-24 LAB — CBC
HCT: 42.7 % (ref 36.0–46.0)
Hemoglobin: 13.4 g/dL (ref 12.0–15.0)
MCH: 30 pg (ref 26.0–34.0)
MCHC: 31.4 g/dL (ref 30.0–36.0)
MCV: 95.5 fL (ref 80.0–100.0)
Platelets: 142 10*3/uL — ABNORMAL LOW (ref 150–400)
RBC: 4.47 MIL/uL (ref 3.87–5.11)
RDW: 15.3 % (ref 11.5–15.5)
WBC: 9.6 10*3/uL (ref 4.0–10.5)
nRBC: 0 % (ref 0.0–0.2)

## 2023-08-24 LAB — HEPARIN LEVEL (UNFRACTIONATED): Heparin Unfractionated: <0.1 [IU]/mL — ABNORMAL LOW (ref 0.30–0.70)

## 2023-08-24 MED ORDER — FUROSEMIDE 10 MG/ML IJ SOLN
40.0000 mg | Freq: Two times a day (BID) | INTRAMUSCULAR | Status: DC
  Administered 2023-08-24 – 2023-08-27 (×6): 40 mg via INTRAVENOUS
  Filled 2023-08-24 (×6): qty 4

## 2023-08-24 MED ORDER — POTASSIUM CHLORIDE CRYS ER 20 MEQ PO TBCR
40.0000 meq | EXTENDED_RELEASE_TABLET | Freq: Once | ORAL | Status: AC
  Administered 2023-08-24: 40 meq via ORAL
  Filled 2023-08-24: qty 2

## 2023-08-24 MED ORDER — HEPARIN SODIUM (PORCINE) 5000 UNIT/ML IJ SOLN
5000.0000 [IU] | Freq: Three times a day (TID) | INTRAMUSCULAR | Status: DC
  Administered 2023-08-24 – 2023-08-30 (×18): 5000 [IU] via SUBCUTANEOUS
  Filled 2023-08-24 (×18): qty 1

## 2023-08-24 NOTE — Progress Notes (Signed)
 OT Cancellation Note  Patient Details Name: [name] MRN: [id number] DOB: 1932/11/21   Cancelled Treatment:    Reason Eval/Treat Not Completed: Patient at procedure or test/ unavailable (Pt working with PT upon entry, OT will follow-up with pt as able.)  08/24/2023  AB, OTR/L  Acute Rehabilitation Services  Office: [phone]   [name] 08/24/2023, 11:57 AM

## 2023-08-24 NOTE — Progress Notes (Signed)
 OT Cancellation Note  Patient Details Name: [name] MRN: [id number] DOB: 30-Jan-1933   Cancelled Treatment:    Reason Eval/Treat Not Completed: Patient declined, no reason specified (Unsure of pt alretness, engaged with pt to begin therapy. Her eyes remained closed, but would move her lips forward and back and would spontaneosly move BUEs in bed to reposition on rails.) Unsure if this as her sleep or an effort to ignore therapist. OT to follow-up with pt as able.   08/24/2023  AB, OTR/L  Acute Rehabilitation Services  Office: [phone]   [name] 08/24/2023, 3:02 PM

## 2023-08-24 NOTE — Progress Notes (Signed)
 Progress Note  Patient Name: [name] Date of Encounter: 08/24/2023  Primary Cardiologist:   Victory LELON [name], MD (Inactive)   Subjective   Denies chest pain or SOB.   Inpatient Medications    Scheduled Meds:  arformoterol   15 mcg Nebulization BID   aspirin  EC  81 mg Oral Daily   budesonide   0.25 mg Nebulization BID   cholecalciferol   1,000 Units Oral Daily   docusate sodium   100 mg Oral Daily   furosemide   80 mg Intravenous BID   ipratropium-albuterol   3 mL Nebulization BID   metoprolol  succinate  25 mg Oral Daily   multivitamin with minerals  1 tablet Oral Daily   pravastatin   10 mg Oral QHS   predniSONE   40 mg Oral Q breakfast   revefenacin   175 mcg Nebulization Daily   Continuous Infusions:  heparin  Stopped (08/24/23 0439)   PRN Meds: ipratropium-albuterol    Vital Signs    Vitals:   08/23/23 2044 08/23/23 2046 08/23/23 2100 08/24/23 0742  BP: (!) 150/122 (!) 150/122 91/70   Pulse: (!) 106 (!) 106 (!) 105   Resp: 13 (!) 24 15   Temp: 98.3 F (36.8 C)     TempSrc: Oral     SpO2: 96% (!) 82% 90% 98%  Weight:      Height:        Intake/Output Summary (Last 24 hours) at 08/24/2023 0915 Last data filed at 08/24/2023 0400 Gross per 24 hour  Intake 464.17 ml  Output --  Net 464.17 ml   Filed Weights   08/21/23 1723  Weight: 90.7 kg    Telemetry    NSR - Personally Reviewed  ECG    NA - Personally Reviewed  Physical Exam   GEN: No acute distress.   Neck: No  JVD Cardiac: RRR, no murmurs, rubs, or gallops.  Respiratory:    Decreased breath sounds with sporadic wheezing GI: Soft, nontender, non-distended  MS:     Mild leg edema; No deformity. Neuro:  Nonfocal  Psych: Normal affect  Labs    Chemistry Recent Labs  Lab 08/22/23 1603 08/22/23 1845 08/23/23 0624 08/24/23 0740  NA 139 140 139 141  K 5.4* 5.0 4.6 3.5  CL 93* 91* 89* 88*  CO2 31 41* 41* 43*  GLUCOSE 105* 191* 108* 105*  BUN 22 21 22  26*  CREATININE 0.93 1.15*  0.92 1.06*  CALCIUM  8.8* 9.0 9.2 8.8*  PROT 6.6  --   --   --   ALBUMIN 3.5  --   --   --   AST 108*  --   --   --   ALT 22  --   --   --   ALKPHOS 52  --   --   --   BILITOT 1.1  --   --   --   GFRNONAA 59* 46* 60* 51*  ANIONGAP 15 8 9 10      Hematology Recent Labs  Lab 08/22/23 0500 08/23/23 0624 08/24/23 0559  WBC 8.8 13.4* 9.6  RBC 4.85 4.29 4.47  HGB 14.8 13.1 13.4  HCT 49.2* 42.1 42.7  MCV 101.4* 98.1 95.5  MCH 30.5 30.5 30.0  MCHC 30.1 31.1 31.4  RDW 14.8 14.7 15.3  PLT 169 156 142*    Cardiac EnzymesNo results for input(s): TROPONINI in the last 168 hours. No results for input(s): TROPIPOC in the last 168 hours.   BNP Recent Labs  Lab 08/21/23 1727  BNP  323.7*     DDimer  Recent Labs  Lab 08/22/23 0500  DDIMER 0.76*     Radiology    VAS US  LOWER EXTREMITY VENOUS (DVT) (ONLY [facility] & [facility]) Result Date: 08/22/2023  Lower Venous DVT Study Patient Name:  [name]  Date of Exam:   08/22/2023 Medical Rec #: [id number]      Accession #:    [id number] Date of Birth: 29-Apr-1933      Patient Gender: F Patient Age:   61 years Exam Location:  [facility] Procedure:      VAS US  LOWER EXTREMITY VENOUS (DVT) Referring Phys: REDIA CLEAVER --------------------------------------------------------------------------------  Indications: Swelling, and Edema.  Risk Factors: Obesity and past pregnancy. Limitations: Body habitus. Comparison Study: No changes seen since previous exam 10/21/20. Performing Technologist: Garnette Rockers  Examination Guidelines: A complete evaluation includes B-mode imaging, spectral Doppler, color Doppler, and power Doppler as needed of all accessible portions of each vessel. Bilateral testing is considered an integral part of a complete examination. Limited examinations for reoccurring indications may be performed as noted. The reflux portion of the exam is performed with the patient in reverse Trendelenburg.   +---------+---------------+---------+-----------+----------+--------------+ RIGHT    CompressibilityPhasicitySpontaneityPropertiesThrombus Aging +---------+---------------+---------+-----------+----------+--------------+ CFV      Full           Yes      Yes                                 +---------+---------------+---------+-----------+----------+--------------+ SFJ      Full                                                        +---------+---------------+---------+-----------+----------+--------------+ FV Prox  Full                                                        +---------+---------------+---------+-----------+----------+--------------+ FV Mid   Full                                                        +---------+---------------+---------+-----------+----------+--------------+ FV DistalFull                                                        +---------+---------------+---------+-----------+----------+--------------+ PFV      Full                                                        +---------+---------------+---------+-----------+----------+--------------+ POP      Full           Yes      Yes                                 +---------+---------------+---------+-----------+----------+--------------+  PTV      Full                                                        +---------+---------------+---------+-----------+----------+--------------+ PERO     Full                                                        +---------+---------------+---------+-----------+----------+--------------+   +---------+---------------+---------+-----------+----------+--------------+ LEFT     CompressibilityPhasicitySpontaneityPropertiesThrombus Aging +---------+---------------+---------+-----------+----------+--------------+ CFV      Full           Yes      Yes                                  +---------+---------------+---------+-----------+----------+--------------+ SFJ      Full                                                        +---------+---------------+---------+-----------+----------+--------------+ FV Prox  Full                                                        +---------+---------------+---------+-----------+----------+--------------+ FV Mid   Full                                                        +---------+---------------+---------+-----------+----------+--------------+ FV DistalFull                                                        +---------+---------------+---------+-----------+----------+--------------+ PFV      Full                                                        +---------+---------------+---------+-----------+----------+--------------+ POP      Full           Yes      Yes                                 +---------+---------------+---------+-----------+----------+--------------+ PTV      Full                                                        +---------+---------------+---------+-----------+----------+--------------+  PERO     Full                                                        +---------+---------------+---------+-----------+----------+--------------+     Summary: BILATERAL: - No evidence of deep vein thrombosis seen in the lower extremities, bilaterally. -No evidence of popliteal cyst, bilaterally.   *See table(s) above for measurements and observations. Electronically signed by Norman Serve on 08/22/2023 at 4:50:58 PM.    Final    ECHOCARDIOGRAM COMPLETE Result Date: 08/22/2023    ECHOCARDIOGRAM REPORT   Patient Name:   [name] Date of Exam: 08/22/2023 Medical Rec #:  [id number]   Height:       59.0 in Accession #:    [id number]  Weight:       200.0 lb Date of Birth:  [birth year]   BSA:          1.844 m Patient Age:    89 years    BP:           122/62 mmHg Patient Gender: F           HR:            80 bpm. Exam Location:  Inpatient Procedure: 2D Echo, Color Doppler and Cardiac Doppler Indications:     ABN ECG  History:         Patient has no prior history of Echocardiogram examinations.                  CAD, Stroke and COPD; Risk Factors:Hypertension and                  Dyslipidemia.  Sonographer:     Tinnie Gosling Referring Phys:  REDIA LABS MD Diagnosing Phys: Vinie Maxcy MD IMPRESSIONS  1. Left ventricular ejection fraction, by estimation, is 50 to 55%. Left ventricular ejection fraction by PLAX is 55 %. The left ventricle has low normal function. The left ventricle has no regional wall motion abnormalities. Left ventricular diastolic parameters are consistent with Grade I diastolic dysfunction (impaired relaxation). Elevated left ventricular end-diastolic pressure. Possible apical/apical septal hypokinesis.  2. Right ventricular systolic function is normal. The right ventricular size is normal. Tricuspid regurgitation signal is inadequate for assessing PA pressure.  3. The mitral valve is abnormal. Trivial mitral valve regurgitation. Moderate to severe mitral annular calcification.  4. The aortic valve was not well visualized. Aortic valve regurgitation is not visualized.  5. The inferior vena cava is normal in size with <50% respiratory variability, suggesting right atrial pressure of 8 mmHg. Comparison(s): No prior Echocardiogram. Changes from prior study are noted. 03/21/2022: LVEF 60-65%. Conclusion(s)/Recommendation(s): Recommend limited echo with Definity  contrast to better evaluate for wall motion abnormalities. FINDINGS  Left Ventricle: Left ventricular ejection fraction, by estimation, is 50 to 55%. Left ventricular ejection fraction by PLAX is 55 %. The left ventricle has low normal function. The left ventricle has no regional wall motion abnormalities. The left ventricular internal cavity size was normal in size. There is no left ventricular hypertrophy. Left ventricular diastolic  parameters are consistent with Grade I diastolic dysfunction (impaired relaxation). Elevated left ventricular end-diastolic pressure.  LV Wall Scoring: Possible apical/apical septal hypokinesis. Right Ventricle: The right ventricular size is normal. No increase in right ventricular wall thickness. Right ventricular systolic function is normal.  Tricuspid regurgitation signal is inadequate for assessing PA pressure. Left Atrium: Left atrial size was normal in size. Right Atrium: Right atrial size was normal in size. Pericardium: There is no evidence of pericardial effusion. Mitral Valve: The mitral valve is abnormal. Moderate to severe mitral annular calcification. Trivial mitral valve regurgitation. Tricuspid Valve: The tricuspid valve is not well visualized. Tricuspid valve regurgitation is not demonstrated. Aortic Valve: The aortic valve was not well visualized. Aortic valve regurgitation is not visualized. Pulmonic Valve: The pulmonic valve was normal in structure. Pulmonic valve regurgitation is not visualized. Aorta: The aortic root and ascending aorta are structurally normal, with no evidence of dilitation. Venous: The inferior vena cava is normal in size with less than 50% respiratory variability, suggesting right atrial pressure of 8 mmHg. IAS/Shunts: No atrial level shunt detected by color flow Doppler.  LEFT VENTRICLE PLAX 2D LV EF:         Left            Diastology                ventricular     LV e' medial:    4.90 cm/s                ejection        LV E/e' medial:  18.1                fraction by     LV e' lateral:   6.31 cm/s                PLAX is 55      LV E/e' lateral: 14.0                %. LVIDd:         5.20 cm LVIDs:         3.70 cm LV PW:         1.00 cm LV IVS:        0.70 cm LVOT diam:     1.90 cm LV SV:         50 LV SV Index:   27 LVOT Area:     2.84 cm  RIGHT VENTRICLE             IVC RV S prime:     12.50 cm/s  IVC diam: 2.00 cm TAPSE (M-mode): 2.0 cm LEFT ATRIUM             Index         RIGHT ATRIUM           Index LA diam:        3.00 cm 1.63 cm/m   RA Area:     14.10 cm LA Vol (A2C):   53.9 ml 29.23 ml/m  RA Volume:   34.90 ml  18.93 ml/m LA Vol (A4C):   62.2 ml 33.73 ml/m LA Biplane Vol: 59.5 ml 32.27 ml/m  AORTIC VALVE LVOT Vmax:   82.20 cm/s LVOT Vmean:  52.500 cm/s LVOT VTI:    0.176 m  AORTA Ao Root diam: 2.20 cm Ao Asc diam:  2.60 cm MITRAL VALVE MV Area (PHT): 4.41 cm     SHUNTS MV Decel Time: 172 msec     Systemic VTI:  0.18 m MV E velocity: 88.50 cm/s   Systemic Diam: 1.90 cm MV A velocity: 118.00 cm/s MV E/A ratio:  0.75 Vinie Maxcy MD Electronically signed by Vinie Maxcy MD Signature Date/Time: 08/22/2023/4:07:27 PM  Final (Updated)     Cardiac Studies   Echocardiogram 08/22/2023   1. Left ventricular ejection fraction, by estimation, is 50 to 55%. Left  ventricular ejection fraction by PLAX is 55 %. The left ventricle has low  normal function. The left ventricle has no regional wall motion  abnormalities. Left ventricular diastolic  parameters are consistent with Grade I diastolic dysfunction (impaired  relaxation). Elevated left ventricular end-diastolic pressure. Possible  apical/apical septal hypokinesis.   2. Right ventricular systolic function is normal. The right ventricular  size is normal. Tricuspid regurgitation signal is inadequate for assessing  PA pressure.   3. The mitral valve is abnormal. Trivial mitral valve regurgitation.  Moderate to severe mitral annular calcification.   4. The aortic valve was not well visualized. Aortic valve regurgitation  is not visualized.   5. The inferior vena cava is normal in size with <50% respiratory  variability, suggesting right atrial pressure of 8 mmHg.   Patient Profile     89 y.o. female with a hx of HFpEF, HTN, HLD, prior CVA without residual deficits, COPD w/ chronic hypoxic respiratory failure (on 2L O2 [place]) who is being seen 08/22/2023 for the evaluation of elevated troponins at the request of  Dr. Franky.   Assessment & Plan    NSTEMI:   She is declining further treatment.  She looks like she will allow current meds to include aspirin , pravastatin , metoprolol .    On heparin .     OK to discontinued and continue ASA.  Will change to DVT prophylaxis heparin  .  Continue IV Lasix  today.   Hypoxic respiratory failure: Multifactorial.  Some component of acute on chronic diastolic heart failure.  EF as above.  On IV Lasix .   Supplemented potassium.    For questions or updates, please contact [facility] Please consult [email or web address] for contact info under Cardiology/STEMI.   Signed, Lynwood Schilling, MD  08/24/2023, 9:15 AM

## 2023-08-24 NOTE — Evaluation (Signed)
 Occupational Therapy Evaluation Patient Details Name: [name] MRN: [id number] DOB: August 01, 1933 Today's Date: 08/24/2023   History of Present Illness 89yo female who presented to [facility] ED on 1/8 via EMS with progressive shortness of breath.  She apparently has not been noncompliant with her home O2, was being seen by her PCP with noted SpO2 72% on room air. PMHx significant for HTN, HLD, COPD, CHF, prior CVA without residual deficits (lacunar infarct 10/2020), thyroid  cyst, OA   Clinical Impression   During eval pt continues to disregard use of supplemental 02 for short distance ambulation to bathroom. Pt overall CGA + RW to ambulate but has decreased activity tolerance, part of which could be cause by her disregard for 02 needs. She remains generally weak, at end of session encouraged pt to get OOB more with staff in recliner to promote strengthening and lung expansion. She does need min to setup assist for ADLs and max A with rear pericare at this time. OT to continue following pt acutely to address deficits and help transition to next level of care. Patient would benefit from post acute Home OT services to help maximize functional independence in natural environment       If plan is discharge home, recommend the following: A little help with bathing/dressing/bathroom;Assistance with cooking/housework;Assist for transportation    Functional Status Assessment  Patient has had a recent decline in their functional status and demonstrates the ability to make significant improvements in function in a reasonable and predictable amount of time.  Equipment Recommendations  None recommended by OT    Recommendations for Other Services       Precautions / Restrictions Precautions Precautions: Fall;Other (comment) Precaution Comments: watch sats Restrictions Weight Bearing Restrictions Per Provider Order: No      Mobility Bed Mobility Overal bed mobility: Needs Assistance Bed Mobility:  Supine to Sit     Supine to sit: Contact guard, HOB elevated, Used rails          Transfers Overall transfer level: Needs assistance Equipment used: Rolling walker (2 wheels) Transfers: Sit to/from Stand Sit to Stand: Min assist           General transfer comment: assist to power up      Balance Overall balance assessment: Needs assistance Sitting-balance support: No upper extremity supported, Feet supported Sitting balance-Leahy Scale: Good     Standing balance support: Bilateral upper extremity supported, During functional activity, Reliant on assistive device for balance Standing balance-Leahy Scale: Poor                             ADL either performed or assessed with clinical judgement   ADL Overall ADL's : Needs assistance/impaired Eating/Feeding: Independent;Sitting   Grooming: Contact guard assist;Standing   Upper Body Bathing: Standing;Contact guard assist   Lower Body Bathing: Sitting/lateral leans;Contact guard assist   Upper Body Dressing : Sitting;Set up   Lower Body Dressing: Sitting/lateral leans;Minimal assistance   Toilet Transfer: Contact guard assist;Rolling walker (2 wheels);Ambulation   Toileting- Clothing Manipulation and Hygiene: Maximal assistance Toileting - Clothing Manipulation Details (indicate cue type and reason): assisted by female RN     Functional mobility during ADLs: Contact guard assist;Rolling walker (2 wheels) General ADL Comments: Pt reports she does not need to use 02 to ambulate to the bathroom, very particular about her setup. Complied with pt request but she would benefit from supplemental 02 to ambulate     Vision  Perception         Praxis         Pertinent Vitals/Pain Pain Assessment Pain Assessment: No/denies pain     Extremity/Trunk Assessment Upper Extremity Assessment Upper Extremity Assessment: Generalized weakness   Lower Extremity Assessment Lower Extremity  Assessment: Generalized weakness   Cervical / Trunk Assessment Cervical / Trunk Assessment: Normal   Communication Communication Communication: No apparent difficulties   Cognition Arousal: Alert Behavior During Therapy: Anxious Overall Cognitive Status: Impaired/Different from baseline Area of Impairment: Safety/judgement                         Safety/Judgement: Decreased awareness of safety, Decreased awareness of deficits (non compliant with 02 use)     General Comments: Got a bit flustered with having her legs up in recliner, reports it gets harder to breahte. Eventually settled down with time     General Comments  Pt desatting on RA, unable to get accurate pleth. She denies SOB when ambulating to bathroom but seemed SOB upon return from bathroom. Would suggest she keep on 02    Exercises     Shoulder Instructions      Home Living Family/patient expects to be discharged to:: Private residence Living Arrangements: Alone Available Help at Discharge: Family;Friend(s);Available PRN/intermittently Type of Home: House Home Access: Stairs to enter [employer] of Steps: 1 Entrance Stairs-Rails: None Home Layout: One level     Bathroom Shower/Tub: Producer, Television/film/video: Handicapped height Bathroom Accessibility: Yes   Home Equipment: Rollator (4 wheels);Rolling Walker (2 wheels);Shower seat - built in;Grab bars - tub/shower   Additional Comments: home O2 (noncompliant)      Prior Functioning/Environment Prior Level of Function : Independent/Modified Independent;Driving             Mobility Comments: RW for amb household distances ADLs Comments: ind. orders groceries for pick up        OT Problem List: Decreased strength;Impaired balance (sitting and/or standing);Decreased safety awareness;Decreased activity tolerance;Obesity      OT Treatment/Interventions: Self-care/ADL training;Therapeutic activities;DME and/or AE  instruction;Balance training;Therapeutic exercise;Energy conservation;Patient/family education    OT Goals(Current goals can be found in the care plan section) Acute Rehab OT Goals Patient Stated Goal: To go home OT Goal Formulation: With patient Time For Goal Achievement: 09/07/23 Potential to Achieve Goals: Good ADL Goals Pt Will Perform Grooming: with modified independence;standing Pt Will Perform Lower Body Dressing: with modified independence;sit to/from stand Pt Will Transfer to Toilet: with modified independence;ambulating Additional ADL Goal #2: Pt will tolerate >7 mins OOB activity to demonstrate improved activity tolerance for ADLs Additional ADL Goal #3: Pt will demonstrate independent use of energy conservation strategies prn to complete functional activities. Additional ADL Goal #4: Pt will comply with supplemental 02 use 100% of the time during therapy sessions.  OT Frequency: Min 1X/week    Co-evaluation              AM-PAC OT 6 Clicks Daily Activity     Outcome Measure Help from another person eating meals?: None Help from another person taking care of personal grooming?: A Little Help from another person toileting, which includes using toliet, bedpan, or urinal?: A Little Help from another person bathing (including washing, rinsing, drying)?: A Little Help from another person to put on and taking off regular upper body clothing?: A Little Help from another person to put on and taking off regular lower body clothing?: A Little 6 Click Score: 19  End of Session Equipment Utilized During Treatment: Gait belt;Rolling walker (2 wheels) Nurse Communication: Mobility status  Activity Tolerance: Patient tolerated treatment well Patient left: in chair;with call bell/phone within reach;with chair alarm set  OT Visit Diagnosis: Unsteadiness on feet (R26.81);Other (comment) (SOB)                Time: 8492-8471 OT Time Calculation (min): 21 min Charges:  OT General  Charges $OT Visit: 1 Visit OT Evaluation $OT Eval Moderate Complexity: 1 Mod  08/24/2023  AB, OTR/L  Acute Rehabilitation Services  Office: [phone]   [name] 08/24/2023, 5:10 PM

## 2023-08-24 NOTE — Evaluation (Signed)
 Physical Therapy Evaluation Patient Details Name: [name] MRN: [id number] DOB: 06-28-1933 Today's Date: 08/24/2023  History of Present Illness  89yo female who presented to [facility] ED on 1/8 via EMS with progressive shortness of breath.  She apparently has not been noncompliant with her home O2, was being seen by her PCP with noted SpO2 72% on room air. PMHx significant for HTN, HLD, COPD, CHF, prior CVA without residual deficits (lacunar infarct 10/2020), thyroid  cyst, OA   Clinical Impression  Pt admitted with above diagnosis. PTA pt lived at home alone with frequent help from family/friends, mod I mobility/ADLs using rollator, driving. Pt currently with functional limitations due to the deficits listed below (see PT Problem List). On eval, pt required min assist bed mobility, min assist transfers, and CGA amb 15' x 2 with RW.  SpO2 in 90s at rest on 2L. Desat to 85% on RA during mobility. Pt will benefit from acute skilled PT to increase their independence and safety with mobility to allow discharge.  Upon d/c, pt would benefit from HHPT.          If plan is discharge home, recommend the following: A little help with walking and/or transfers;A little help with bathing/dressing/bathroom;Help with stairs or ramp for entrance;Assistance with cooking/housework;Assist for transportation   Can travel by private vehicle        Equipment Recommendations Rollator (4 wheels) (Pt reports her current rollator needs replacement.)  Recommendations for Other Services       Functional Status Assessment Patient has had a recent decline in their functional status and demonstrates the ability to make significant improvements in function in a reasonable and predictable amount of time.     Precautions / Restrictions Precautions Precautions: Fall;Other (comment) Precaution Comments: watch sats      Mobility  Bed Mobility Overal bed mobility: Needs Assistance Bed Mobility: Supine to Sit, Sit to  Supine     Supine to sit: Min assist, Used rails, HOB elevated Sit to supine: Min assist, HOB elevated, Used rails   General bed mobility comments: increased time and effort    Transfers Overall transfer level: Needs assistance Equipment used: Rolling walker (2 wheels) Transfers: Sit to/from Stand Sit to Stand: Min assist           General transfer comment: assist to power up    Ambulation/Gait Ambulation/Gait assistance: Contact guard assist Gait Distance (Feet): 15 Feet (x 2) Assistive device: Rolling walker (2 wheels) Gait Pattern/deviations: Step-through pattern Gait velocity: decreased Gait velocity interpretation: <1.31 ft/sec, indicative of household ambulator   General Gait Details: amb on RA with desat to 85%  Stairs            Wheelchair Mobility     Tilt Bed    Modified Rankin (Stroke Patients Only)       Balance Overall balance assessment: Needs assistance Sitting-balance support: No upper extremity supported, Feet supported Sitting balance-Leahy Scale: Good     Standing balance support: Bilateral upper extremity supported, During functional activity, Reliant on assistive device for balance Standing balance-Leahy Scale: Poor                               Pertinent Vitals/Pain Pain Assessment Pain Assessment: No/denies pain    Home Living Family/patient expects to be discharged to:: Private residence Living Arrangements: Alone Available Help at Discharge: Family;Friend(s);Available PRN/intermittently Type of Home: House Home Access: Stairs to enter Entrance Stairs-Rails: None Entrance [employer]  of Steps: 1   Home Layout: One level Home Equipment: Rollator (4 wheels);Rolling Walker (2 wheels);Shower seat - built in;Grab bars - tub/shower Additional Comments: home O2 (noncompliant)    Prior Function Prior Level of Function : Independent/Modified Independent;Driving             Mobility Comments: RW for amb  household distances ADLs Comments: orders groceries for pick up     Extremity/Trunk Assessment   Upper Extremity Assessment Upper Extremity Assessment: Defer to OT evaluation    Lower Extremity Assessment Lower Extremity Assessment: Generalized weakness    Cervical / Trunk Assessment Cervical / Trunk Assessment: Normal  Communication   Communication Communication: No apparent difficulties  Cognition Arousal: Alert Behavior During Therapy: WFL for tasks assessed/performed Overall Cognitive Status: Within Functional Limits for tasks assessed                                          General Comments General comments (skin integrity, edema, etc.): SpO2 in 90s on 2L. Pt requesting to remove O2 to amb to BR. Desat to 85% on RA.    Exercises     Assessment/Plan    PT Assessment Patient needs continued PT services  PT Problem List Decreased strength;Decreased balance;Decreased mobility;Cardiopulmonary status limiting activity;Decreased activity tolerance       PT Treatment Interventions Functional mobility training;Balance training;Patient/family education;Gait training;Therapeutic activities;Therapeutic exercise;Stair training    PT Goals (Current goals can be found in the Care Plan section)  Acute Rehab PT Goals Patient Stated Goal: home PT Goal Formulation: With patient Time For Goal Achievement: 09/07/23 Potential to Achieve Goals: Good    Frequency Min 1X/week     Co-evaluation               AM-PAC PT 6 Clicks Mobility  Outcome Measure Help needed turning from your back to your side while in a flat bed without using bedrails?: A Little Help needed moving from lying on your back to sitting on the side of a flat bed without using bedrails?: A Little Help needed moving to and from a bed to a chair (including a wheelchair)?: A Little Help needed standing up from a chair using your arms (e.g., wheelchair or bedside chair)?: A Little Help  needed to walk in hospital room?: A Little Help needed climbing 3-5 steps with a railing? : A Lot 6 Click Score: 17    End of Session Equipment Utilized During Treatment: Gait belt;Oxygen Activity Tolerance: Patient tolerated treatment well Patient left: in bed;with call bell/phone within reach;with bed alarm set Nurse Communication: Mobility status PT Visit Diagnosis: Difficulty in walking, not elsewhere classified (R26.2);Muscle weakness (generalized) (M62.81)    Time: 8860-8842 PT Time Calculation (min) (ACUTE ONLY): 18 min   Charges:   PT Evaluation $PT Eval Moderate Complexity: 1 Mod   PT General Charges $$ ACUTE PT VISIT: 1 Visit         Sari MATSU., PT  Office # [phone]   Erven Sari Shaker 08/24/2023, 2:35 PM

## 2023-08-24 NOTE — Progress Notes (Signed)
 PROGRESS NOTE        PATIENT DETAILS Name: [name] Age: 89 y.o. Sex: female Date of Birth: 1932/11/11 Admit Date: 08/21/2023 Admitting Physician Redia LOISE Cleaver, MD ERE:[email or web address], [name], MD  Brief Summary: Patient is a 89 y.o.  female with history of COPD on 2 L of oxygen at home, chronic HFpEF, HTN, HLD, CVA-who presented with shortness of breath-thought to be due to flash pulm edema-post admission patient became somnolent and was found to have hypercarbia.  She was also found to have possible non-STEMI.  She was placed on BiPAP-IV heparin -IV Lasix  -bronchodilators-and has clinically improved.  See below for further details.  Significant events: 1/8>> admit to [facility]-short of breath-hypoxic-likely from flash pulm edema 1/9 >> somnolent-hypercarbia on ABG-BiPAP  1/10>> back on 2 L of oxygen  Significant studies: 1/8>> CXR: Central vascular congestion 1/9>> CXR: Increased interstitial edema 1/9>> bilateral lower extremity Doppler: No DVT 1/9>> echo: EF 50-55%, grade 1 diastolic dysfunction.  Significant microbiology data: 1/8>> COVID/influenza/RSV PCR: Negative 1/9>> respiratory virus panel: Negative  Procedures: None  Consults: Cardiology Pulmonology  Subjective: No complaints today-feels much better-lying flat in bed.  Objective: Vitals: Blood pressure 91/70, pulse (!) 105, temperature 98.3 F (36.8 C), temperature source Oral, resp. rate 15, height 4' 11 (1.499 m), weight 90.7 kg, SpO2 98%.   Exam: Gen Exam:Alert awake-not in any distress HEENT:atraumatic, normocephalic Chest: B/L clear to auscultation anteriorly CVS:S1S2 regular Abdomen:soft non tender, non distended Extremities:+ edema Neurology: Non focal Skin: no rash  Pertinent Labs/Radiology:    Latest Ref Rng & Units 08/24/2023    5:59 AM 08/23/2023    6:24 AM 08/22/2023    5:00 AM  CBC  WBC 4.0 - 10.5 K/uL 9.6  13.4  8.8   Hemoglobin 12.0 - 15.0 g/dL 86.5  86.8  85.1    Hematocrit 36.0 - 46.0 % 42.7  42.1  49.2   Platelets 150 - 400 K/uL 142  156  169     Lab Results  Component Value Date   NA 141 08/24/2023   K 3.5 08/24/2023   CL 88 (L) 08/24/2023   CO2 43 (H) 08/24/2023      Assessment/Plan: Acute metabolic encephalopathy Secondary to hypercarbia/hypoxia Markedly better after treatment of underlying etiologies-completely awake and alert.  Acute on chronic hypoxic and hypercarbic respiratory failure Felt to be primarily due to flash pulm edema-COPD exacerbation may be playing a role Significantly better-with diuretics/bronchodilators/steroids-liberated off BiPAP 1/10-currently stable on just 2-3 L of oxygen.    Flash pulm edema Acute on chronic HFpEF Significant improvement in volume status  Cardiology recommending that we continue IV Lasix  but will decrease dose slightly today Follow weights/electrolytes  Non-STEMI No chest pain-but potentially causing above She does not want to pursue LHC-is okay with medical management IV heparin  discontinued after 48 hours on 1/11 On aspirin /statin/beta-blocker  HTN BP stable Beta-blocker Amlodipine  remains on hold  COPD exacerbation Has resolved Lungs are completely clear on exam Quick prednisone  taper Continue bronchodilators.  Prior history of CVA No residual deficits  aspirin /statin  Morbid Obesity: Estimated body mass index is 40.4 kg/m as calculated from the following:   Height as of this encounter: 4' 11 (1.499 m).   Weight as of this encounter: 90.7 kg.   Code status:   Code Status: Limited: Do not attempt resuscitation (DNR) -DNR-LIMITED -Do Not Intubate/DNI  DVT Prophylaxis:IV heparin     Family Communication:Sister-[name]  [phone] left VM on 1/11   Disposition Plan: Status is: Inpatient Remains inpatient appropriate because: Severity of illness   Planned Discharge Destination:Home health   Diet: Diet Order             Diet regular Room  service appropriate? Yes with Assist; Fluid consistency: Thin  Diet effective now                     Antimicrobial agents: Anti-infectives (From admission, onward)    Start     Dose/Rate Route Frequency Ordered Stop   08/22/23 0245  cefTRIAXone  (ROCEPHIN ) 1 g in sodium chloride  0.9 % 100 mL IVPB  Status:  Discontinued        1 g 200 mL/hr over 30 Minutes Intravenous Daily at bedtime 08/22/23 0232 08/23/23 1114   08/21/23 1745  azithromycin  (ZITHROMAX ) 500 mg in sodium chloride  0.9 % 250 mL IVPB        500 mg 250 mL/hr over 60 Minutes Intravenous  Once 08/21/23 1734 08/21/23 1920        MEDICATIONS: Scheduled Meds:  arformoterol   15 mcg Nebulization BID   aspirin  EC  81 mg Oral Daily   budesonide   0.25 mg Nebulization BID   cholecalciferol   1,000 Units Oral Daily   docusate sodium   100 mg Oral Daily   furosemide   80 mg Intravenous BID   heparin   5,000 Units Subcutaneous Q8H   ipratropium-albuterol   3 mL Nebulization BID   metoprolol  succinate  25 mg Oral Daily   multivitamin with minerals  1 tablet Oral Daily   pravastatin   10 mg Oral QHS   predniSONE   40 mg Oral Q breakfast   revefenacin   175 mcg Nebulization Daily   Continuous Infusions:   PRN Meds:.ipratropium-albuterol    I have personally reviewed following labs and imaging studies  LABORATORY DATA: CBC: Recent Labs  Lab 08/21/23 2245 08/22/23 0500 08/23/23 0624 08/24/23 0559  WBC 9.7 8.8 13.4* 9.6  NEUTROABS 9.2*  --   --   --   HGB 13.8 14.8 13.1 13.4  HCT 45.8 49.2* 42.1 42.7  MCV 100.7* 101.4* 98.1 95.5  PLT 144* 169 156 142*    Basic Metabolic Panel: Recent Labs  Lab 08/21/23 1727 08/22/23 1603 08/22/23 1845 08/23/23 0624 08/24/23 0740  NA 141 139 140 139 141  K 4.4 5.4* 5.0 4.6 3.5  CL 94* 93* 91* 89* 88*  CO2 37* 31 41* 41* 43*  GLUCOSE 111* 105* 191* 108* 105*  BUN 22 22 21 22  26*  CREATININE 0.72 0.93 1.15* 0.92 1.06*  CALCIUM  9.2 8.8* 9.0 9.2 8.8*  MG  --   --   --  2.2   --   PHOS  --   --   --  2.9  --     GFR: Estimated Creatinine Clearance: 36 mL/min (A) (by C-G formula based on SCr of 1.06 mg/dL (H)).  Liver Function Tests: Recent Labs  Lab 08/22/23 1603  AST 108*  ALT 22  ALKPHOS 52  BILITOT 1.1  PROT 6.6  ALBUMIN 3.5   No results for input(s): LIPASE, AMYLASE in the last 168 hours. No results for input(s): AMMONIA in the last 168 hours.  Coagulation Profile: No results for input(s): INR, PROTIME in the last 168 hours.  Cardiac Enzymes: No results for input(s): CKTOTAL, CKMB, CKMBINDEX, TROPONINI in the last 168 hours.  BNP (last 3 results) No results for  input(s): PROBNP in the last 8760 hours.  Lipid Profile: Recent Labs    08/22/23 0500 08/22/23 1845  CHOL 179 161  HDL 67 56  LDLCALC 102* 96  TRIG 50 46  CHOLHDL 2.7 2.9    Thyroid  Function Tests: Recent Labs    08/22/23 0500  TSH 0.984    Anemia Panel: Recent Labs    08/22/23 1603  VITAMINB12 724  FOLATE 12.6    Urine analysis:    Component Value Date/Time   COLORURINE STRAW (A) 10/21/2020 1229   APPEARANCEUR CLEAR 10/21/2020 1229   LABSPEC 1.009 10/21/2020 1229   PHURINE 5.0 10/21/2020 1229   GLUCOSEU NEGATIVE 10/21/2020 1229   HGBUR NEGATIVE 10/21/2020 1229   BILIRUBINUR NEGATIVE 10/21/2020 1229   KETONESUR NEGATIVE 10/21/2020 1229   PROTEINUR NEGATIVE 10/21/2020 1229   UROBILINOGEN 0.2 01/27/2009 1018   NITRITE NEGATIVE 10/21/2020 1229   LEUKOCYTESUR NEGATIVE 10/21/2020 1229    Sepsis Labs: Lactic Acid, Venous No results found for: LATICACIDVEN  MICROBIOLOGY: Recent Results (from the past 240 hours)  Resp panel by RT-PCR (RSV, Flu A&B, Covid) Anterior Nasal Swab     Status: None   Collection Time: 08/21/23  5:28 PM   Specimen: Anterior Nasal Swab  Result Value Ref Range Status   SARS Coronavirus 2 by RT PCR NEGATIVE NEGATIVE Final   Influenza A by PCR NEGATIVE NEGATIVE Final   Influenza B by PCR NEGATIVE NEGATIVE  Final    Comment: (NOTE) The Xpert Xpress SARS-CoV-2/FLU/RSV plus assay is intended as an aid in the diagnosis of influenza from Nasopharyngeal swab specimens and should not be used as a sole basis for treatment. Nasal washings and aspirates are unacceptable for Xpert Xpress SARS-CoV-2/FLU/RSV testing.  Fact Sheet for Patients: bloggercourse.com  Fact Sheet for Healthcare Providers: seriousbroker.it  This test is not yet approved or cleared by the [place]  FDA and has been authorized for detection and/or diagnosis of SARS-CoV-2 by FDA under an Emergency Use Authorization (EUA). This EUA will remain in effect (meaning this test can be used) for the duration of the COVID-19 declaration under Section 564(b)(1) of the Act, 21 U.S.C. section 360bbb-3(b)(1), unless the authorization is terminated or revoked.     Resp Syncytial Virus by PCR NEGATIVE NEGATIVE Final    Comment: (NOTE) Fact Sheet for Patients: bloggercourse.com  Fact Sheet for Healthcare Providers: seriousbroker.it  This test is not yet approved or cleared by the [place]  FDA and has been authorized for detection and/or diagnosis of SARS-CoV-2 by FDA under an Emergency Use Authorization (EUA). This EUA will remain in effect (meaning this test can be used) for the duration of the COVID-19 declaration under Section 564(b)(1) of the Act, 21 U.S.C. section 360bbb-3(b)(1), unless the authorization is terminated or revoked.  Performed at [facility], [address] [place], [place] [address]   Respiratory (~20 pathogens) panel by PCR     Status: None   Collection Time: 08/22/23  6:37 AM   Specimen: Nasopharyngeal Swab; Respiratory  Result Value Ref Range Status   Adenovirus NOT DETECTED NOT DETECTED Final   Coronavirus 229E NOT DETECTED NOT DETECTED Final    Comment: (NOTE) The Coronavirus on the  Respiratory Panel, DOES NOT test for the novel  Coronavirus (2019 nCoV)    Coronavirus HKU1 NOT DETECTED NOT DETECTED Final   Coronavirus NL63 NOT DETECTED NOT DETECTED Final   Coronavirus OC43 NOT DETECTED NOT DETECTED Final   Metapneumovirus NOT DETECTED NOT DETECTED Final   Rhinovirus / Enterovirus  NOT DETECTED NOT DETECTED Final   Influenza A NOT DETECTED NOT DETECTED Final   Influenza B NOT DETECTED NOT DETECTED Final   Parainfluenza Virus 1 NOT DETECTED NOT DETECTED Final   Parainfluenza Virus 2 NOT DETECTED NOT DETECTED Final   Parainfluenza Virus 3 NOT DETECTED NOT DETECTED Final   Parainfluenza Virus 4 NOT DETECTED NOT DETECTED Final   Respiratory Syncytial Virus NOT DETECTED NOT DETECTED Final   Bordetella pertussis NOT DETECTED NOT DETECTED Final   Bordetella Parapertussis NOT DETECTED NOT DETECTED Final   Chlamydophila pneumoniae NOT DETECTED NOT DETECTED Final   Mycoplasma pneumoniae NOT DETECTED NOT DETECTED Final    Comment: Performed at [facility], [address] [place], [place] [address]    RADIOLOGY STUDIES/RESULTS: ECHOCARDIOGRAM COMPLETE Result Date: 08/22/2023    ECHOCARDIOGRAM REPORT   Patient Name:   Ariyonna Lambing Date of Exam: 08/22/2023 Medical Rec #:  [id number]   Height:       59.0 in Accession #:    [id number]  Weight:       200.0 lb Date of Birth:  July 08, 1933   BSA:          1.844 m Patient Age:    89 years    BP:           122/62 mmHg Patient Gender: F           HR:           80 bpm. Exam Location:  Inpatient Procedure: 2D Echo, Color Doppler and Cardiac Doppler Indications:     ABN ECG  History:         Patient has no prior history of Echocardiogram examinations.                  CAD, Stroke and COPD; Risk Factors:Hypertension and                  Dyslipidemia.  Sonographer:     Tinnie Gosling Referring Phys:  REDIA LABS MD Diagnosing Phys: Vinie Maxcy MD IMPRESSIONS  1. Left ventricular ejection fraction, by estimation, is 50 to 55%. Left ventricular  ejection fraction by PLAX is 55 %. The left ventricle has low normal function. The left ventricle has no regional wall motion abnormalities. Left ventricular diastolic parameters are consistent with Grade I diastolic dysfunction (impaired relaxation). Elevated left ventricular end-diastolic pressure. Possible apical/apical septal hypokinesis.  2. Right ventricular systolic function is normal. The right ventricular size is normal. Tricuspid regurgitation signal is inadequate for assessing PA pressure.  3. The mitral valve is abnormal. Trivial mitral valve regurgitation. Moderate to severe mitral annular calcification.  4. The aortic valve was not well visualized. Aortic valve regurgitation is not visualized.  5. The inferior vena cava is normal in size with <50% respiratory variability, suggesting right atrial pressure of 8 mmHg. Comparison(s): No prior Echocardiogram. Changes from prior study are noted. 03/21/2022: LVEF 60-65%. Conclusion(s)/Recommendation(s): Recommend limited echo with Definity  contrast to better evaluate for wall motion abnormalities. FINDINGS  Left Ventricle: Left ventricular ejection fraction, by estimation, is 50 to 55%. Left ventricular ejection fraction by PLAX is 55 %. The left ventricle has low normal function. The left ventricle has no regional wall motion abnormalities. The left ventricular internal cavity size was normal in size. There is no left ventricular hypertrophy. Left ventricular diastolic parameters are consistent with Grade I diastolic dysfunction (impaired relaxation). Elevated left ventricular end-diastolic pressure.  LV Wall Scoring: Possible apical/apical septal hypokinesis. Right Ventricle: The right  ventricular size is normal. No increase in right ventricular wall thickness. Right ventricular systolic function is normal. Tricuspid regurgitation signal is inadequate for assessing PA pressure. Left Atrium: Left atrial size was normal in size. Right Atrium: Right atrial size  was normal in size. Pericardium: There is no evidence of pericardial effusion. Mitral Valve: The mitral valve is abnormal. Moderate to severe mitral annular calcification. Trivial mitral valve regurgitation. Tricuspid Valve: The tricuspid valve is not well visualized. Tricuspid valve regurgitation is not demonstrated. Aortic Valve: The aortic valve was not well visualized. Aortic valve regurgitation is not visualized. Pulmonic Valve: The pulmonic valve was normal in structure. Pulmonic valve regurgitation is not visualized. Aorta: The aortic root and ascending aorta are structurally normal, with no evidence of dilitation. Venous: The inferior vena cava is normal in size with less than 50% respiratory variability, suggesting right atrial pressure of 8 mmHg. IAS/Shunts: No atrial level shunt detected by color flow Doppler.  LEFT VENTRICLE PLAX 2D LV EF:         Left            Diastology                ventricular     LV e' medial:    4.90 cm/s                ejection        LV E/e' medial:  18.1                fraction by     LV e' lateral:   6.31 cm/s                PLAX is 55      LV E/e' lateral: 14.0                %. LVIDd:         5.20 cm LVIDs:         3.70 cm LV PW:         1.00 cm LV IVS:        0.70 cm LVOT diam:     1.90 cm LV SV:         50 LV SV Index:   27 LVOT Area:     2.84 cm  RIGHT VENTRICLE             IVC RV S prime:     12.50 cm/s  IVC diam: 2.00 cm TAPSE (M-mode): 2.0 cm LEFT ATRIUM             Index        RIGHT ATRIUM           Index LA diam:        3.00 cm 1.63 cm/m   RA Area:     14.10 cm LA Vol (A2C):   53.9 ml 29.23 ml/m  RA Volume:   34.90 ml  18.93 ml/m LA Vol (A4C):   62.2 ml 33.73 ml/m LA Biplane Vol: 59.5 ml 32.27 ml/m  AORTIC VALVE LVOT Vmax:   82.20 cm/s LVOT Vmean:  52.500 cm/s LVOT VTI:    0.176 m  AORTA Ao Root diam: 2.20 cm Ao Asc diam:  2.60 cm MITRAL VALVE MV Area (PHT): 4.41 cm     SHUNTS MV Decel Time: 172 msec     Systemic VTI:  0.18 m MV E velocity: 88.50 cm/s    Systemic Diam: 1.90 cm MV A velocity: 118.00 cm/s MV  E/A ratio:  0.75 Vinie Maxcy MD Electronically signed by Vinie Maxcy MD Signature Date/Time: 08/22/2023/4:07:27 PM    Final (Updated)      LOS: 2 days   Donalda Applebaum, MD  [facility]    To contact the attending provider between 7A-7P or the covering provider during after hours 7P-7A, please log into the web site [email or web address] and access using universal [place] password for that web site. If you do not have the password, please call the hospital operator.  08/24/2023, 11:05 AM

## 2023-08-25 DIAGNOSIS — J9601 Acute respiratory failure with hypoxia: Secondary | ICD-10-CM | POA: Diagnosis not present

## 2023-08-25 LAB — BASIC METABOLIC PANEL
Anion gap: 12 (ref 5–15)
BUN: 26 mg/dL — ABNORMAL HIGH (ref 8–23)
CO2: 39 mmol/L — ABNORMAL HIGH (ref 22–32)
Calcium: 8.9 mg/dL (ref 8.9–10.3)
Chloride: 90 mmol/L — ABNORMAL LOW (ref 98–111)
Creatinine, Ser: 1.06 mg/dL — ABNORMAL HIGH (ref 0.44–1.00)
GFR, Estimated: 51 mL/min — ABNORMAL LOW (ref >60)
Glucose, Bld: 107 mg/dL — ABNORMAL HIGH (ref 70–99)
Potassium: 4.1 mmol/L (ref 3.5–5.1)
Sodium: 141 mmol/L (ref 135–145)

## 2023-08-25 LAB — CBC WITH DIFFERENTIAL/PLATELET
Abs Immature Granulocytes: 0.02 10*3/uL (ref 0.00–0.07)
Basophils Absolute: 0 10*3/uL (ref 0.0–0.1)
Basophils Relative: 0 %
Eosinophils Absolute: 0.1 10*3/uL (ref 0.0–0.5)
Eosinophils Relative: 1 %
HCT: 43.8 % (ref 36.0–46.0)
Hemoglobin: 13.4 g/dL (ref 12.0–15.0)
Immature Granulocytes: 0 %
Lymphocytes Relative: 25 %
Lymphs Abs: 2 10*3/uL (ref 0.7–4.0)
MCH: 30.3 pg (ref 26.0–34.0)
MCHC: 30.6 g/dL (ref 30.0–36.0)
MCV: 99.1 fL (ref 80.0–100.0)
Monocytes Absolute: 0.8 10*3/uL (ref 0.1–1.0)
Monocytes Relative: 10 %
Neutro Abs: 5.1 10*3/uL (ref 1.7–7.7)
Neutrophils Relative %: 64 %
Platelets: 118 10*3/uL — ABNORMAL LOW (ref 150–400)
RBC: 4.42 MIL/uL (ref 3.87–5.11)
RDW: 15 % (ref 11.5–15.5)
WBC: 8 10*3/uL (ref 4.0–10.5)
nRBC: 0 % (ref 0.0–0.2)

## 2023-08-25 LAB — PHOSPHORUS: Phosphorus: 5 mg/dL — ABNORMAL HIGH (ref 2.5–4.6)

## 2023-08-25 LAB — BRAIN NATRIURETIC PEPTIDE: B Natriuretic Peptide: 408.9 pg/mL — ABNORMAL HIGH (ref 0.0–100.0)

## 2023-08-25 LAB — MAGNESIUM: Magnesium: 2.3 mg/dL (ref 1.7–2.4)

## 2023-08-25 MED ORDER — METOPROLOL SUCCINATE ER 50 MG PO TB24
50.0000 mg | ORAL_TABLET | Freq: Every day | ORAL | Status: DC
  Administered 2023-08-26 – 2023-08-28 (×3): 50 mg via ORAL
  Filled 2023-08-25 (×6): qty 1

## 2023-08-25 NOTE — Progress Notes (Signed)
 Progress Note  Patient Name: [name] Date of Encounter: 08/25/2023  Primary Cardiologist:   Victory LELON [name], MD (Inactive)   Subjective   She is breathing better.  2 liters.  No pain.   Inpatient Medications    Scheduled Meds:  arformoterol   15 mcg Nebulization BID   aspirin  EC  81 mg Oral Daily   budesonide   0.25 mg Nebulization BID   cholecalciferol   1,000 Units Oral Daily   docusate sodium   100 mg Oral Daily   furosemide   40 mg Intravenous BID   heparin   5,000 Units Subcutaneous Q8H   ipratropium-albuterol   3 mL Nebulization BID   metoprolol  succinate  25 mg Oral Daily   multivitamin with minerals  1 tablet Oral Daily   pravastatin   10 mg Oral QHS   predniSONE   40 mg Oral Q breakfast   revefenacin   175 mcg Nebulization Daily   Continuous Infusions:   PRN Meds: ipratropium-albuterol    Vital Signs    Vitals:   08/24/23 2359 08/25/23 0411 08/25/23 0751 08/25/23 0821  BP: (!) 129/106 (!) 151/71 125/64   Pulse: 94 93 87   Resp: (!) 24 (!) 24 (!) 25   Temp: 98.1 F (36.7 C) 98 F (36.7 C) 98.3 F (36.8 C)   TempSrc: Oral Oral Oral   SpO2: 96% 94% 94% 93%  Weight:      Height:        Intake/Output Summary (Last 24 hours) at 08/25/2023 0825 Last data filed at 08/25/2023 0029 Gross per 24 hour  Intake --  Output 1300 ml  Net -1300 ml   Filed Weights   08/21/23 1723  Weight: 90.7 kg    Telemetry    NSR - Personally Reviewed  ECG    NA - Personally Reviewed  Physical Exam   GEN: No  acute distress.   Neck: No  JVD Cardiac: RRR, no murmurs, rubs, or gallops.  Respiratory: Clear   to auscultation bilaterally. GI: Soft, nontender, non-distended, normal bowel sounds  MS:  Mild edema; No deformity. Neuro:   Nonfocal  Psych: Oriented and appropriate    Labs    Chemistry Recent Labs  Lab 08/22/23 1603 08/22/23 1845 08/23/23 0624 08/24/23 0740 08/25/23 0614  NA 139   < > 139 141 141  K 5.4*   < > 4.6 3.5 4.1  CL 93*   < > 89*  88* 90*  CO2 31   < > 41* 43* 39*  GLUCOSE 105*   < > 108* 105* 107*  BUN 22   < > 22 26* 26*  CREATININE 0.93   < > 0.92 1.06* 1.06*  CALCIUM  8.8*   < > 9.2 8.8* 8.9  PROT 6.6  --   --   --   --   ALBUMIN 3.5  --   --   --   --   AST 108*  --   --   --   --   ALT 22  --   --   --   --   ALKPHOS 52  --   --   --   --   BILITOT 1.1  --   --   --   --   GFRNONAA 59*   < > 60* 51* 51*  ANIONGAP 15   < > 9 10 12    < > = values in this interval not displayed.     Hematology Recent Labs  Lab 08/23/23 [phone]  08/24/23 0559 08/25/23 0614  WBC 13.4* 9.6 8.0  RBC 4.29 4.47 4.42  HGB 13.1 13.4 13.4  HCT 42.1 42.7 43.8  MCV 98.1 95.5 99.1  MCH 30.5 30.0 30.3  MCHC 31.1 31.4 30.6  RDW 14.7 15.3 15.0  PLT 156 142* 118*    Cardiac EnzymesNo results for input(s): TROPONINI in the last 168 hours. No results for input(s): TROPIPOC in the last 168 hours.   BNP Recent Labs  Lab 08/21/23 1727 08/25/23 0614  BNP 323.7* 408.9*     DDimer  Recent Labs  Lab 08/22/23 0500  DDIMER 0.76*     Radiology    No results found.   Cardiac Studies   Echocardiogram 08/22/2023   1. Left ventricular ejection fraction, by estimation, is 50 to 55%. Left  ventricular ejection fraction by PLAX is 55 %. The left ventricle has low  normal function. The left ventricle has no regional wall motion  abnormalities. Left ventricular diastolic  parameters are consistent with Grade I diastolic dysfunction (impaired  relaxation). Elevated left ventricular end-diastolic pressure. Possible  apical/apical septal hypokinesis.   2. Right ventricular systolic function is normal. The right ventricular  size is normal. Tricuspid regurgitation signal is inadequate for assessing  PA pressure.   3. The mitral valve is abnormal. Trivial mitral valve regurgitation.  Moderate to severe mitral annular calcification.   4. The aortic valve was not well visualized. Aortic valve regurgitation  is not visualized.    5. The inferior vena cava is normal in size with <50% respiratory  variability, suggesting right atrial pressure of 8 mmHg.   Patient Profile     89 y.o. female with a hx of HFpEF, HTN, HLD, prior CVA without residual deficits, COPD w/ chronic hypoxic respiratory failure (on 2L O2 [place]) who is being seen 08/22/2023 for the evaluation of elevated troponins at the request of Dr. Franky.   Assessment & Plan    NSTEMI:   She has wanted more conservative treatment.  I stopped her heparin  yesterday.  Increase beta blocker slightly.   Hypoxic respiratory failure: Multifactorial.  Some component of acute on chronic diastolic heart failure.  BNP mildly elevated.   EF as above.  On IV Lasix .   Net negative 2.7 liters.    Continue IV diuresis.     For questions or updates, please contact [facility] Please consult [email or web address] for contact info under Cardiology/STEMI.   Signed, Lynwood Schilling, MD  08/25/2023, 8:25 AM

## 2023-08-25 NOTE — Plan of Care (Signed)

## 2023-08-25 NOTE — Progress Notes (Signed)
 Mobility Specialist Progress Note;   08/25/23 0940  Mobility  Activity Transferred from bed to chair  Level of Assistance Minimal assist, patient does 75% or more  Assistive Device Front wheel walker  Distance Ambulated (ft) 3 ft  Activity Response Tolerated well  Mobility Referral Yes  Mobility visit 1 Mobility  Mobility Specialist Start Time (ACUTE ONLY) 0940  Mobility Specialist Stop Time (ACUTE ONLY) 0954  Mobility Specialist Time Calculation (min) (ACUTE ONLY) 14 min   Pt eager for mobility. Required MinA for STS and for transfer from bed to chair. VSS on 3LO2. No c/o during session. Pt left comfortably in chair with all needs met, alarm on. On 3LO2.   Lauraine Erm Mobility Specialist Please contact via SecureChat or [employer] [phone]

## 2023-08-25 NOTE — Progress Notes (Signed)
 PROGRESS NOTE        PATIENT DETAILS Name: [name] Age: 89 y.o. Sex: female Date of Birth: Feb 03, 1933 Admit Date: 08/21/2023 Admitting Physician Redia LOISE Cleaver, MD ERE:Yjmmpd, [name], MD  Brief Summary: Patient is a 89 y.o.  female with history of COPD on 2 L of oxygen at home, chronic HFpEF, HTN, HLD, CVA-who presented with shortness of breath-thought to be due to flash pulm edema-post admission patient became somnolent and was found to have hypercarbia.  She was also found to have possible non-STEMI.  She was placed on BiPAP-IV heparin -IV Lasix  -bronchodilators-and has clinically improved.  See below for further details.  Significant events: 1/8>> admit to [facility]-short of breath-hypoxic-likely from flash pulm edema 1/9 >> somnolent-hypercarbia on ABG-BiPAP  1/10>> back on 2 L of oxygen  Significant studies: 1/8>> CXR: Central vascular congestion 1/9>> CXR: Increased interstitial edema 1/9>> bilateral lower extremity Doppler: No DVT 1/9>> echo: EF 50-55%, grade 1 diastolic dysfunction.  Significant microbiology data: 1/8>> COVID/influenza/RSV PCR: Negative 1/9>> respiratory virus panel: Negative  Procedures: None  Consults: Cardiology Pulmonology  Subjective: Patient in bed, appears comfortable, denies any headache, no fever, no chest pain or pressure, no shortness of breath , no abdominal pain. No focal weakness.  Objective: Vitals: Blood pressure 125/64, pulse 87, temperature 98.3 F (36.8 C), temperature source Oral, resp. rate (!) 25, height 4' 11 (1.499 m), weight 90.7 kg, SpO2 93%.   Exam:  .Awake Alert, No new F.N deficits, Normal affect [place].AT,PERRAL Supple Neck, No JVD,   Symmetrical Chest wall movement, Good air movement bilaterally, few bibasilar crackles, trace bilateral lower extremity edema RRR,No Gallops, Rubs or new Murmurs,  +ve B.Sounds, Abd Soft, No tenderness,     Assessment/Plan: Acute metabolic  encephalopathy Secondary to hypercarbia/hypoxia Markedly better after treatment of underlying etiologies-completely awake and alert.  Acute on chronic hypoxic and hypercarbic respiratory failure Felt to be primarily due to flash pulm edema-COPD exacerbation may be playing a role Significantly better-with diuretics/bronchodilators/steroids-liberated off BiPAP 1/10-currently stable on just 2-3 L of oxygen.    Flash pulm edema Acute on chronic HFpEF Significant improvement in volume status  Cardiology on board continue IV Lasix  and beta-blocker Follow weights/electrolytes  Non-STEMI No chest pain-but potentially causing above She does not want to pursue LHC-is okay with medical management IV heparin  discontinued after 48 hours on 1/11 On aspirin /statin/beta-blocker  HTN BP stable Beta-blocker Amlodipine  remains on hold  COPD exacerbation Has resolved Lungs are completely clear on exam Quick prednisone  taper Continue bronchodilators.  Prior history of CVA No residual deficits  aspirin /statin  Morbid Obesity: Estimated body mass index is 40.4 kg/m as calculated from the following:   Height as of this encounter: 4' 11 (1.499 m).   Weight as of this encounter: 90.7 kg.   Code status:   Code Status: Limited: Do not attempt resuscitation (DNR) -DNR-LIMITED -Do Not Intubate/DNI    DVT Prophylaxis:IV heparin     Family Communication:Sister-[name]  [phone] left VM on 1/11   Disposition Plan: Status is: Inpatient Remains inpatient appropriate because: Severity of illness   Planned Discharge Destination:Home health   Diet: Diet Order             Diet regular Room service appropriate? Yes with Assist; Fluid consistency: Thin  Diet effective now  MEDICATIONS: Scheduled Meds:  arformoterol   15 mcg Nebulization BID   aspirin  EC  81 mg Oral Daily   budesonide   0.25 mg Nebulization BID   cholecalciferol   1,000 Units Oral Daily    docusate sodium   100 mg Oral Daily   furosemide   40 mg Intravenous BID   heparin   5,000 Units Subcutaneous Q8H   ipratropium-albuterol   3 mL Nebulization BID   metoprolol  succinate  50 mg Oral Daily   multivitamin with minerals  1 tablet Oral Daily   pravastatin   10 mg Oral QHS   predniSONE   40 mg Oral Q breakfast   revefenacin   175 mcg Nebulization Daily   Continuous Infusions:   PRN Meds:.ipratropium-albuterol    I have personally reviewed following labs and imaging studies  LABORATORY DATA:  Recent Labs  Lab 08/21/23 2245 08/22/23 0500 08/23/23 0624 08/24/23 0559 08/25/23 0614  WBC 9.7 8.8 13.4* 9.6 8.0  HGB 13.8 14.8 13.1 13.4 13.4  HCT 45.8 49.2* 42.1 42.7 43.8  PLT 144* 169 156 142* 118*  MCV 100.7* 101.4* 98.1 95.5 99.1  MCH 30.3 30.5 30.5 30.0 30.3  MCHC 30.1 30.1 31.1 31.4 30.6  RDW 14.6 14.8 14.7 15.3 15.0  LYMPHSABS 0.3*  --   --   --  2.0  MONOABS 0.1  --   --   --  0.8  EOSABS 0.0  --   --   --  0.1  BASOSABS 0.0  --   --   --  0.0    Recent Labs  Lab 08/21/23 1727 08/22/23 0500 08/22/23 1603 08/22/23 1845 08/23/23 0624 08/24/23 0740 08/25/23 0614  NA 141  --  139 140 139 141 141  K 4.4  --  5.4* 5.0 4.6 3.5 4.1  CL 94*  --  93* 91* 89* 88* 90*  CO2 37*  --  31 41* 41* 43* 39*  ANIONGAP 10  --  15 8 9 10 12   GLUCOSE 111*  --  105* 191* 108* 105* 107*  BUN 22  --  22 21 22  26* 26*  CREATININE 0.72  --  0.93 1.15* 0.92 1.06* 1.06*  AST  --   --  108*  --   --   --   --   ALT  --   --  22  --   --   --   --   ALKPHOS  --   --  52  --   --   --   --   BILITOT  --   --  1.1  --   --   --   --   ALBUMIN  --   --  3.5  --   --   --   --   DDIMER  --  0.76*  --   --   --   --   --   PROCALCITON  --   --   --  <0.10  --   --   --   TSH  --  0.984  --   --   --   --   --   HGBA1C  --  6.1*  --   --   --   --   --   BNP 323.7*  --   --   --   --   --  408.9*  MG  --   --   --   --  2.2  --  2.3  PHOS  --   --   --   --  2.9  --  5.0*  CALCIUM   9.2  --  8.8* 9.0 9.2 8.8* 8.9    Lab Results  Component Value Date   CHOL 161 08/22/2023   HDL 56 08/22/2023   LDLCALC 96 08/22/2023   TRIG 46 08/22/2023   CHOLHDL 2.9 08/22/2023      Recent Labs  Lab 08/21/23 1727 08/22/23 0500 08/22/23 1603 08/22/23 1845 08/23/23 0624 08/24/23 0740 08/25/23 0614  DDIMER  --  0.76*  --   --   --   --   --   PROCALCITON  --   --   --  <0.10  --   --   --   TSH  --  0.984  --   --   --   --   --   HGBA1C  --  6.1*  --   --   --   --   --   BNP 323.7*  --   --   --   --   --  408.9*  MG  --   --   --   --  2.2  --  2.3  CALCIUM  9.2  --  8.8* 9.0 9.2 8.8* 8.9    No results for input(s): TSH, T4TOTAL, FREET4, T3FREE, THYROIDAB in the last 72 hours.  Recent Labs    08/22/23 1603  VITAMINB12 724  FOLATE 12.6       RADIOLOGY STUDIES/RESULTS: No results found.    LOS: 3 days   Signature  -    Lavada Stank M.D on 08/25/2023 at 10:45 AM   -  To page go to [email or web address]

## 2023-08-26 ENCOUNTER — Other Ambulatory Visit (HOSPITAL_COMMUNITY): Payer: Self-pay

## 2023-08-26 DIAGNOSIS — J9601 Acute respiratory failure with hypoxia: Secondary | ICD-10-CM | POA: Diagnosis not present

## 2023-08-26 NOTE — Progress Notes (Signed)
   08/26/23 1246  Mobility  Activity Refused mobility  Mobility Specialist Start Time (ACUTE ONLY) 1246  Mobility Specialist Stop Time (ACUTE ONLY) 1248  Mobility Specialist Time Calculation (min) (ACUTE ONLY) 2 min   Mobility Specialist: Progress Note  Pt refused mobility session d/t resting comfortably in chair - Received and left in chair with all needs met. Call bell within reach. MS will follow up as time permits.   Virgle Boards, BS Mobility Specialist Please contact via SecureChat or Rehab office at [phone].

## 2023-08-26 NOTE — Progress Notes (Signed)
   Heart Failure Stewardship Pharmacist Progress Note   PCP: Arloa [name], MD PCP-Cardiologist: Victory LELON [name], MD (Inactive)    HPI:  89 yo F with PMH COPD on 2 L O2, HFpEF, hypertension, hyperlipidemia, stroke, carotid stenosis.  Patient presented to pulmonologist on 1/8 and became hypoxic during a coughing episode and she was referred to the ED. Patient presented to ED with worsening SOB and bilateral LEE and productive cough. Trop 17->155 with EKG showing diffuse ST depression with concern for NSTEMI. Cardiology consulted and she was found to have NSTEMI complicated by flash pulmonary edema. Due to her respiratory status and inability to lay flat, cath was deferred. Plan for aggressive diuresis and BiPAP then revisit cardiac cath, however patient preferred more conservative management and declined further treatment. Respiratory status has improved with diuresis. Likely multifactorial in the setting of HF with elevated BNP and COPD. ECHO from 1/9 showed EF 50-55%, G1DD, elevated LVEDP, possible, apical/apical septal hypokinesis, normal RV function, trivial MR, moderate to severe mitral annular calcification.   Patient states she is feeling okay today. She reports improvement in LEE and SOB. On 2 L O2 at home and in the room today. O2 sat ~90% in the room with me, occasionally dropping to 88-89% as she was talking to me. States she feels more SOB with activity and when feeling anxious. Tells me that she had numbness and tingling when taking Farxiga  in the past and these sx resolved when she stopped the medication. She is not willing to try Farxiga  or Jardiance in the future.  Current HF Medications: Diuretic: furosemide  40 mg IV BID Beta Blocker: metoprolol  succinate 50 mg daily  Prior to admission HF Medications: Diuretic: furosemide  20 mg daily Beta blocker: metoprolol  succinate 25 mg daily   Pertinent Lab Values: Labs from 1/12: Serum creatinine 1.06, BUN 26, Potassium 4.1,  Sodium 141, BNP 408.9, Magnesium 2.3, A1c 6.1%  Vital Signs: Weight: 200 lbs (admission weight: 200 lbs) Blood pressure: 102-136/50-80s  Heart rate: 80s-90s  I/O: net -0.6 L yesterday; net --3.3 L since admission  Medication Assistance / Insurance Benefits Check: Does the patient have prescription insurance?  Yes Type of insurance plan: [employer]  Outpatient Pharmacy:  Prior to admission outpatient [facility] Is the patient willing to use [facility] pharmacy at discharge? Yes Is the patient willing to transition their outpatient pharmacy to utilize a [facility] outpatient pharmacy?   No    Assessment: 1. Acute on chronic HFpEF (LVEF 50-55%), likely due to ICM. NYHA class II symptoms. - Continue metoprolol  XL 50 mg daily - Continue furosemide  40 mg IV BID. Trace LEE present. - Daily weights. Strict I/O. Keep K>4 and Mg>2. - Avoid spironolactone due to hx of high normal potassium - Will avoid Jardiance due to patient preference to not add another medication. She had tingling in her fingers with Farxiga  in the past which would not be a contraindication to trying Jardiance. Discussed this option with patient and she declined.   Plan: 1) Medication changes recommended at this time: - No changes at this time   2) Patient assistance: - Jardiance/Farxiga /Entresto is $90 per 30 day supply   3)  Education  - To be completed prior to discharge  Izetta Henry, PharmD PGY-1 Pharmacy Resident  Duwaine Plant, PharmD, BCPS Heart Failure Stewardship Pharmacist Phone [phone]

## 2023-08-26 NOTE — Progress Notes (Signed)
 PROGRESS NOTE        PATIENT DETAILS Name: [name] Age: 89 y.o. Sex: female Date of Birth: [birth year] Admit Date: 08/21/2023 Admitting Physician Redia LOISE Cleaver, MD ERE:Yjmmpd, [name], MD  Brief Summary: Patient is a 89 y.o.  female with history of COPD on 2 L of oxygen at home, chronic HFpEF, HTN, HLD, CVA-who presented with shortness of breath-thought to be due to flash pulm edema-post admission patient became somnolent and was found to have hypercarbia.  She was also found to have possible non-STEMI.  She was placed on BiPAP-IV heparin -IV Lasix  -bronchodilators-and has clinically improved.  See below for further details.  Significant events: 1/8>> admit to [facility]-short of breath-hypoxic-likely from flash pulm edema 1/9 >> somnolent-hypercarbia on ABG-BiPAP  1/10>> back on 2 L of oxygen  Significant studies: 1/8>> CXR: Central vascular congestion 1/9>> CXR: Increased interstitial edema 1/9>> bilateral lower extremity Doppler: No DVT 1/9>> echo: EF 50-55%, grade 1 diastolic dysfunction.  Significant microbiology data: 1/8>> COVID/influenza/RSV PCR: Negative 1/9>> respiratory virus panel: Negative  Procedures: None  Consults: Cardiology Pulmonology  Subjective:  Patient in bed, appears comfortable, denies any headache, no fever, no chest pain or pressure, no shortness of breath , no abdominal pain. No new focal weakness.   Objective: Vitals: Blood pressure 123/89, pulse 97, temperature 98 F (36.7 C), temperature source Oral, resp. rate (!) 26, height 4' 11 (1.499 m), weight 90.7 kg, SpO2 (!) 89%.   Exam:  Awake Alert, No new F.N deficits, Normal affect [place].AT,PERRAL Supple Neck, No JVD,   Symmetrical Chest wall movement, Good air movement bilaterally, few bibasilar crackles, trace bilateral lower extremity edema RRR,No Gallops, Rubs or new Murmurs,  +ve B.Sounds, Abd Soft, No tenderness,     Assessment/Plan:  Acute metabolic  encephalopathy Secondary to hypercarbia/hypoxia Markedly better after treatment of underlying etiologies-completely awake and alert.  Acute on chronic hypoxic and hypercarbic respiratory failure Felt to be primarily due to flash pulm edema-COPD exacerbation may be playing a role Significantly better-with diuretics/bronchodilators/steroids-liberated off BiPAP 1/10-currently stable on just 2-3 L of oxygen.    Flash pulm edema Acute on chronic HFpEF Significant improvement in volume status  Cardiology on board continue IV Lasix  and beta-blocker Follow weights/electrolytes  Non-STEMI No chest pain-but potentially causing above She does not want to pursue LHC-is okay with medical management IV heparin  discontinued after 48 hours on 1/11 On aspirin /statin/beta-blocker  HTN BP stable Beta-blocker Amlodipine  remains on hold  COPD exacerbation Has resolved Lungs are completely clear on exam Quick prednisone  taper Continue bronchodilators.  Prior history of CVA No residual deficits  aspirin /statin  Morbid Obesity: Estimated body mass index is 40.4 kg/m as calculated from the following:   Height as of this encounter: 4' 11 (1.499 m).   Weight as of this encounter: 90.7 kg.   Code status:   Code Status: Limited: Do not attempt resuscitation (DNR) -DNR-LIMITED -Do Not Intubate/DNI    DVT Prophylaxis:IV heparin     Family Communication: Carliss Essex  [phone] left VM on 1/11   Disposition Plan: Status is: Inpatient Remains inpatient appropriate because: Severity of illness   Planned Discharge Destination:Home health   Diet: Diet Order             Diet regular Room service appropriate? Yes with Assist; Fluid consistency: Thin  Diet effective now  MEDICATIONS: Scheduled Meds:  arformoterol   15 mcg Nebulization BID   aspirin  EC  81 mg Oral Daily   budesonide   0.25 mg Nebulization BID   cholecalciferol   1,000 Units Oral Daily    docusate sodium   100 mg Oral Daily   furosemide   40 mg Intravenous BID   heparin   5,000 Units Subcutaneous Q8H   metoprolol  succinate  50 mg Oral Daily   multivitamin with minerals  1 tablet Oral Daily   pravastatin   10 mg Oral QHS   revefenacin   175 mcg Nebulization Daily   Continuous Infusions:   PRN Meds:.ipratropium-albuterol    I have personally reviewed following labs and imaging studies  LABORATORY DATA:  Recent Labs  Lab 08/21/23 2245 08/22/23 0500 08/23/23 0624 08/24/23 0559 08/25/23 0614  WBC 9.7 8.8 13.4* 9.6 8.0  HGB 13.8 14.8 13.1 13.4 13.4  HCT 45.8 49.2* 42.1 42.7 43.8  PLT 144* 169 156 142* 118*  MCV 100.7* 101.4* 98.1 95.5 99.1  MCH 30.3 30.5 30.5 30.0 30.3  MCHC 30.1 30.1 31.1 31.4 30.6  RDW 14.6 14.8 14.7 15.3 15.0  LYMPHSABS 0.3*  --   --   --  2.0  MONOABS 0.1  --   --   --  0.8  EOSABS 0.0  --   --   --  0.1  BASOSABS 0.0  --   --   --  0.0    Recent Labs  Lab 08/21/23 1727 08/22/23 0500 08/22/23 1603 08/22/23 1845 08/23/23 0624 08/24/23 0740 08/25/23 0614  NA 141  --  139 140 139 141 141  K 4.4  --  5.4* 5.0 4.6 3.5 4.1  CL 94*  --  93* 91* 89* 88* 90*  CO2 37*  --  31 41* 41* 43* 39*  ANIONGAP 10  --  15 8 9 10 12   GLUCOSE 111*  --  105* 191* 108* 105* 107*  BUN 22  --  22 21 22  26* 26*  CREATININE 0.72  --  0.93 1.15* 0.92 1.06* 1.06*  AST  --   --  108*  --   --   --   --   ALT  --   --  22  --   --   --   --   ALKPHOS  --   --  52  --   --   --   --   BILITOT  --   --  1.1  --   --   --   --   ALBUMIN  --   --  3.5  --   --   --   --   DDIMER  --  0.76*  --   --   --   --   --   PROCALCITON  --   --   --  <0.10  --   --   --   TSH  --  0.984  --   --   --   --   --   HGBA1C  --  6.1*  --   --   --   --   --   BNP 323.7*  --   --   --   --   --  408.9*  MG  --   --   --   --  2.2  --  2.3  PHOS  --   --   --   --  2.9  --  5.0*  CALCIUM  9.2  --  8.8* 9.0 9.2 8.8* 8.9    Lab Results  Component Value Date   CHOL 161  08/22/2023   HDL 56 08/22/2023   LDLCALC 96 08/22/2023   TRIG 46 08/22/2023   CHOLHDL 2.9 08/22/2023      Recent Labs  Lab 08/21/23 1727 08/22/23 0500 08/22/23 1603 08/22/23 1845 08/23/23 0624 08/24/23 0740 08/25/23 9385  DDIMER  --  0.76*  --   --   --   --   --   PROCALCITON  --   --   --  <0.10  --   --   --   TSH  --  0.984  --   --   --   --   --   HGBA1C  --  6.1*  --   --   --   --   --   BNP 323.7*  --   --   --   --   --  408.9*  MG  --   --   --   --  2.2  --  2.3  CALCIUM  9.2  --  8.8* 9.0 9.2 8.8* 8.9    RADIOLOGY STUDIES/RESULTS: No results found.    LOS: 4 days   Signature  -    Lavada Stank M.D on 08/26/2023 at 9:54 AM   -  To page go to [email or web address]

## 2023-08-26 NOTE — Plan of Care (Signed)

## 2023-08-26 NOTE — Plan of Care (Signed)

## 2023-08-26 NOTE — TOC Initial Note (Signed)
 Transition of Care [facility]) - Initial/Assessment Note    Patient Details  Name: [name] MRN: [id number] Date of Birth: [birth year]  Transition of Care [facility]) CM/SW Contact:    Inocente GORMAN Kindle, LCSW Phone Number: 08/26/2023, 3:35 PM  Clinical Narrative:                 CSW received consult for possible SNF placement at time of discharge. CSW spoke with patient (and her sister on the phone out of state). Patient reported that she lives alone and is not comfortable going home given patient's current physical needs and fall risk. Patient expressed understanding of PT recommendation and is agreeable to SNF placement at time of discharge. Patient reports preference for [facility] and [facility] if a private room available. CSW discussed insurance authorization process and will provide Medicare SNF ratings list. CSW will send out referrals for review and provide bed offers as available.     Skilled Nursing Rehab Facilities-   [email or web address] Ratings out of 5 stars (the highest)   Name Address  Phone # Quality Care Staffing Health Inspection Overall  [facility] [address] [place] [phone] 2 2 5 5   [facility] [address], [place] [phone] 4 2 4 4   [facility] [address], [facility] [phone] [facility] [address], [place] [phone] 3 1 4 3   [facility]  [address], [place] [phone] 4 4 5 5   [facility] [address] [facility] [phone] 3 2 2 2   [facility] [address], [place] [phone] 5 1 2 2   [facility] [phone] N[address] [place] [phone] 1 1 3 1   [facility] ([facility]) [address], [place] [phone] 2 2 2 2   [facility] [address] [place], [place] [phone] 2 2 1 1   [facility] ([place]) [address], [place] [phone] 3 1 1 1   Clotilda Pereyra [address]  [place] [phone] 3 3 4 4   [facility] [address], [place] [phone] 3 4 3 3           [facility] [address] [place] [phone] 4 2 1 1   [facility] [address], [place] [phone] 2 2 2 2   [facility] [address], [place] [phone] 2 1 4 3   [address] [place] [address], [place] [phone] 1 1 2 1   [facility] [address], [employer] [phone] 2 1 4 3           [address] (no [facility]) [address], [place] [phone] 4 4 5 5   [facility] (No Humana) [address] [phone] 1 2 4 3   [facility] (No UHC) [address], [place] [phone] 4 1 5 4   [facility] [address], [employer] [phone] 3 4 2 2   [facility] [address] [place] [phone] 2 1 2 1   [facility] [address] [place] [phone] 2 1 1 1   [facility] [address] [place] [facility] [phone] 4 2 5 5   [facility]  [address] [place] [phone] 2 2 3 3   [facility] [address], [place] [phone] 4 1 1 1   [facility] [address] [place], [place] [phone] 2 2 2 2           [facility] [address] [place] [phone] 2 1 1 1   [place]  [address] [place]  [phone] 3 3 3 3   [facility] [address], [place] [phone] 4 3 5 5   [facility] [address], [place] [phone] 2 1 1 1   [facility] (No Humana) [address], [place] [phone] 2 2 4 4   [facility] [address] [place] [phone] 2 1 2 1           [facility] [address] [place], [place] [phone] 5 4 5 5   [facility])  [address] [place] [phone] 1 1 2 1   [facility]) [address] [place] [phone]  2 4 4   [facility] [address] [place] [phone] 3 5 5 5   [address] [place], [place] [phone] 4 2 2 2   [facility]) [address] [place] [phone] 2 1 3 2      Expected Discharge Plan: Skilled Nursing Facility Barriers to Discharge: Continued Medical Work up, English As A Second Language Teacher, SNF Pending bed offer   Patient Goals and CMS Choice Patient states their goals for this hospitalization and ongoing recovery are:: Rehab CMS Medicare.gov Compare Post Acute Care list provided to:: Patient Choice offered to / list presented to : Patient [place] ownership interest in [facility].provided to:: Patient    Expected Discharge Plan and Services In-house Referral: Clinical Social Work   Post Acute Care Choice: Skilled Nursing Facility Living arrangements for the past 2 months: Single Family Home                                      Prior Living Arrangements/Services Living arrangements for the past 2 months: Single Family Home Lives with:: Self Patient language and need for interpreter reviewed:: Yes Do you feel safe going back to the place where you live?: Yes      Need for Family Participation in Patient Care: Yes (Comment) Care giver support system in place?: Yes (comment) Current home services: DME [place], walk-in tub, grab bars, O2) Criminal Activity/Legal Involvement Pertinent to Current Situation/Hospitalization: No - Comment as needed  Activities of Daily Living   ADL Screening (condition at time of admission) Independently performs ADLs?: Yes (appropriate for developmental age) Is the patient deaf or have difficulty hearing?: No Does the patient have difficulty seeing, even when wearing glasses/contacts?: No Does the patient have difficulty concentrating, remembering, or making decisions?: No  Permission Sought/Granted Permission sought to share information with : Facility Medical Sales Representative, Family Supports Permission granted to share information with :  Yes, Verbal Permission Granted     Permission granted to share info w AGENCY: SNFs        Emotional Assessment Appearance:: Appears stated age Attitude/Demeanor/Rapport: Engaged Affect (typically observed): Accepting, Appropriate, Pleasant Orientation: : Oriented to Self, Oriented to Place, Oriented to  Time, Oriented to Situation Alcohol / Substance Use: Not Applicable Psych Involvement: No (comment)  Admission diagnosis:  Abnormal EKG [R94.31] Hypoxia [R09.02] COPD exacerbation (HCC) [J44.1] Acute respiratory failure with hypoxia (HCC) [J96.01] Patient Active Problem List   Diagnosis Date Noted   COPD with acute exacerbation (HCC) 08/22/2023   Abnormal EKG 08/22/2023   NSTEMI (non-ST elevated myocardial infarction) (HCC) 08/22/2023   Macrocytosis 08/22/2023   Lower extremity edema 08/22/2023   Acute on chronic respiratory failure with hypoxia and hypercapnia (HCC) 08/22/2023   Acute pulmonary edema (HCC) 08/22/2023  Acute on chronic heart failure with preserved ejection fraction (HFpEF) (HCC) 08/22/2023   Acute on chronic congestive heart failure (HCC) 08/22/2023   Carotid artery disease (HCC) 05/11/2022   Chronic respiratory failure with hypoxia (HCC) 03/19/2022   Pain in joint of left knee 01/15/2022   Lumbar radiculopathy 12/04/2021   Disorder of bone 10/16/2021   Allergic rhinitis 07/05/2021   Body mass index (BMI) 37.0-37.9, adult 07/05/2021   Cardiac arrhythmia 07/05/2021   (HFpEF) heart failure with preserved ejection fraction (HCC) 07/05/2021   Incontinence of urine 07/05/2021   Lesion of ulnar nerve 07/05/2021   Osteoarthritis of knee 07/05/2021   Overactive bladder 07/05/2021   History of colonic polyps 07/05/2021   Pure hypercholesterolemia 07/05/2021   Sciatica 07/05/2021   Sleep disturbance    Prediabetes    Hyperglycemia    Hypercapnia    Stroke (cerebrum) (HCC) 10/26/2020   Encephalopathy    Hyponatremia    Supplemental oxygen dependent     Chronic low back pain    Volume overload 10/24/2020   Ischemic stroke of frontal lobe (HCC) 10/23/2020   Tremors of nervous system 10/23/2020   Acute respiratory failure with hypoxia (HCC) 10/21/2020   Hypertensive urgency 10/21/2020   Pain in joint of right hip 02/18/2020   Pain of both hip joints 02/18/2020   Neurogenic claudication 11/13/2019   Spondylolisthesis of lumbar region 05/12/2018   Degeneration of lumbar intervertebral disc 05/12/2018   Lumbar pain 05/12/2018   Spinal stenosis of lumbar region 05/12/2018   Essential hypertension 08/25/2014   Dyspnea 08/25/2014   Morbid obesity (HCC) 08/25/2014   COPD (chronic obstructive pulmonary disease) (HCC) 08/25/2014   PCP:  Arloa [name], MD Pharmacy:   [phone] , [place] - [address] AT [facility] [address] [place] [address] Phone: [phone] Fax: [phone]  [facility] [address] [place] [address] Phone: [phone] Fax: [phone]     Social Drivers of Health (SDOH) Social History: SDOH Screenings   Food Insecurity: No Food Insecurity (08/23/2023)  Housing: Low Risk  (08/23/2023)  Transportation Needs: No Transportation Needs (08/23/2023)  Utilities: Not At Risk (08/23/2023)  Depression (PHQ2-9): Low Risk  (12/12/2020)  Social Connections: Unknown (08/23/2023)  Tobacco Use: Medium Risk (08/22/2023)   SDOH Interventions:     Readmission Risk Interventions     No data to display

## 2023-08-26 NOTE — Progress Notes (Addendum)
 Progress Note  Patient Name: [name] Date of Encounter: 08/26/2023  Primary Cardiologist:   Victory LELON [name], MD (Inactive)   Subjective   She is back on home O2 level of 2 lpm Has not been oob much, gets SOB w/ activity Has scales but has not been weighing herself, does not know dry wt Would like rehab facility at d/c  Inpatient Medications    Scheduled Meds:  arformoterol   15 mcg Nebulization BID   aspirin  EC  81 mg Oral Daily   budesonide   0.25 mg Nebulization BID   cholecalciferol   1,000 Units Oral Daily   docusate sodium   100 mg Oral Daily   furosemide   40 mg Intravenous BID   heparin   5,000 Units Subcutaneous Q8H   metoprolol  succinate  50 mg Oral Daily   multivitamin with minerals  1 tablet Oral Daily   pravastatin   10 mg Oral QHS   revefenacin   175 mcg Nebulization Daily   Continuous Infusions:   PRN Meds: ipratropium-albuterol    Vital Signs    Vitals:   08/25/23 2007 08/26/23 0107 08/26/23 0550 08/26/23 0816  BP:  136/62 123/89   Pulse: 98     Resp: (!) 22     Temp:  98.2 F (36.8 C) 98 F (36.7 C)   TempSrc:  Oral Oral   SpO2: 94% 95%  98%  Weight:      Height:        Intake/Output Summary (Last 24 hours) at 08/26/2023 0906 Last data filed at 08/26/2023 0631 Gross per 24 hour  Intake --  Output 650 ml  Net -650 ml   Filed Weights   08/21/23 1723  Weight: 90.7 kg    Telemetry    SR, PACs - Personally Reviewed  ECG    None today - Personally Reviewed  Physical Exam   GEN: No acute distress.   Neck: JVD 9 cm Cardiac: RRR, no murmur, no rubs, or gallops.  Respiratory: diminished to auscultation bilaterally with rales in the bases. GI: Soft, nontender, non-distended  MS: No edema; No deformity. Neuro:  Nonfocal  Psych: Normal affect   Labs    Chemistry Recent Labs  Lab 08/22/23 1603 08/22/23 1845 08/23/23 0624 08/24/23 0740 08/25/23 0614  NA 139   < > 139 141 141  K 5.4*   < > 4.6 3.5 4.1  CL 93*   < > 89*  88* 90*  CO2 31   < > 41* 43* 39*  GLUCOSE 105*   < > 108* 105* 107*  BUN 22   < > 22 26* 26*  CREATININE 0.93   < > 0.92 1.06* 1.06*  CALCIUM  8.8*   < > 9.2 8.8* 8.9  PROT 6.6  --   --   --   --   ALBUMIN 3.5  --   --   --   --   AST 108*  --   --   --   --   ALT 22  --   --   --   --   ALKPHOS 52  --   --   --   --   BILITOT 1.1  --   --   --   --   GFRNONAA 59*   < > 60* 51* 51*  ANIONGAP 15   < > 9 10 12    < > = values in this interval not displayed.     Hematology Recent Labs  Lab 08/23/23  9375 08/24/23 0559 08/25/23 0614  WBC 13.4* 9.6 8.0  RBC 4.29 4.47 4.42  HGB 13.1 13.4 13.4  HCT 42.1 42.7 43.8  MCV 98.1 95.5 99.1  MCH 30.5 30.0 30.3  MCHC 31.1 31.4 30.6  RDW 14.7 15.3 15.0  PLT 156 142* 118*    Cardiac EnzymesNo results for input(s): TROPONINI in the last 168 hours. No results for input(s): TROPIPOC in the last 168 hours.   BNP Recent Labs  Lab 08/21/23 1727 08/25/23 0614  BNP 323.7* 408.9*     DDimer  Recent Labs  Lab 08/22/23 0500  DDIMER 0.76*     Radiology    No results found.   Cardiac Studies   Echocardiogram 08/22/2023   1. Left ventricular ejection fraction, by estimation, is 50 to 55%. Left ventricular ejection fraction by PLAX is 55 %. The left ventricle has low normal function. The left ventricle has no regional wall motion abnormalities. Left ventricular diastolic  parameters are consistent with Grade I diastolic dysfunction (impaired relaxation). Elevated left ventricular end-diastolic pressure. Possible apical/apical septal hypokinesis.   2. Right ventricular systolic function is normal. The right ventricular size is normal. Tricuspid regurgitation signal is inadequate for assessing PA pressure.   3. The mitral valve is abnormal. Trivial mitral valve regurgitation. Moderate to severe mitral annular calcification.   4. The aortic valve was not well visualized. Aortic valve regurgitation is not visualized.   5. The inferior  vena cava is normal in size with <50% respiratory variability, suggesting right atrial pressure of 8 mmHg.   Patient Profile     89 y.o. female with a hx of HFpEF, HTN, HLD, prior CVA without residual deficits, COPD w/ chronic hypoxic respiratory failure (on 2L O2 [place]) who is being seen 08/22/2023 for the evaluation of elevated troponins at the request of Dr. Franky.   Assessment & Plan    NSTEMI:    - pt ok w/ conservative mgt, no chest pain.   Acute on Chronic Hypoxic respiratory failure:  - hx COPD w/ home O2 @ 2lpm - some volume overload on top of that  Acute on chronic HFpEF - diuresing w/ IV Lasix  40 mg bid - BUN/CR same as 01/12 values on admit - recheck ordered for tomorrow - ordered daily weight to start today - output is 3326 ml, but no intake recorded - wt on admit is at the low end of prev values, follow  Deconditioning - pt has been seen by PT/OT and mobility, needs some assistance but no SNF at this time - per IM  Otherwise, per IM   For questions or updates, please contact [facility] Please consult [email or web address] for contact info under Cardiology/STEMI.   Signed, Shona Shad, PA-C  08/26/2023, 9:06 AM    History and all data above reviewed.  Patient examined.  I agree with the findings as above.  No pain.  Breathing is better but not at baseline.  The patient exam reveals COR:RRR  ,  Lungs: Decreased breath sounds with few crackles  ,  Abd: Positive bowel sounds, no rebound no guarding, Ext Mild edema  .  All available labs, radiology testing, previous records reviewed. Agree with documented assessment and plan.   Acute hypoxic respiratory failure with a component of diastolic dysfunction.  Tolerating IV diuresis.  Continue until creat will not allow.  Still congested.  No med changes otherwise.  She has been drinking lots of water and we had a conversation about this.  [name]  11:29 AM  08/26/2023

## 2023-08-26 NOTE — NC FL2 (Signed)
 [place]  MEDICAID FL2 LEVEL OF CARE FORM     IDENTIFICATION  Patient Name: [name] Birthdate: 01/21/1933 Sex: female Admission Date (Current Location): 08/21/2023  [facility] and [place] Number:  Producer, Television/film/video and Address:  [facility] [place], [address] [place], [place] [address]      Provider Number: [id number]  Attending Physician Name and Address:  Dennise Lavada POUR, MD  Relative Name and Phone Number:       Current Level of Care: Hospital Recommended Level of Care: Skilled Nursing Facility Prior Approval Number:    Date Approved/Denied:   PASRR Number: [id number]  Discharge Plan: SNF    Current Diagnoses: Patient Active Problem List   Diagnosis Date Noted   COPD with acute exacerbation (HCC) 08/22/2023   Abnormal EKG 08/22/2023   NSTEMI (non-ST elevated myocardial infarction) (HCC) 08/22/2023   Macrocytosis 08/22/2023   Lower extremity edema 08/22/2023   Acute on chronic respiratory failure with hypoxia and hypercapnia (HCC) 08/22/2023   Acute pulmonary edema (HCC) 08/22/2023   Acute on chronic heart failure with preserved ejection fraction (HFpEF) (HCC) 08/22/2023   Acute on chronic congestive heart failure (HCC) 08/22/2023   Carotid artery disease (HCC) 05/11/2022   Chronic respiratory failure with hypoxia (HCC) 03/19/2022   Pain in joint of left knee 01/15/2022   Lumbar radiculopathy 12/04/2021   Disorder of bone 10/16/2021   Allergic rhinitis 07/05/2021   Body mass index (BMI) 37.0-37.9, adult 07/05/2021   Cardiac arrhythmia 07/05/2021   (HFpEF) heart failure with preserved ejection fraction (HCC) 07/05/2021   Incontinence of urine 07/05/2021   Lesion of ulnar nerve 07/05/2021   Osteoarthritis of knee 07/05/2021   Overactive bladder 07/05/2021   History of colonic polyps 07/05/2021   Pure hypercholesterolemia 07/05/2021   Sciatica 07/05/2021   Sleep disturbance    Prediabetes    Hyperglycemia    Hypercapnia     Stroke (cerebrum) (HCC) 10/26/2020   Encephalopathy    Hyponatremia    Supplemental oxygen dependent    Chronic low back pain    Volume overload 10/24/2020   Ischemic stroke of frontal lobe (HCC) 10/23/2020   Tremors of nervous system 10/23/2020   Acute respiratory failure with hypoxia (HCC) 10/21/2020   Hypertensive urgency 10/21/2020   Pain in joint of right hip 02/18/2020   Pain of both hip joints 02/18/2020   Neurogenic claudication 11/13/2019   Spondylolisthesis of lumbar region 05/12/2018   Degeneration of lumbar intervertebral disc 05/12/2018   Lumbar pain 05/12/2018   Spinal stenosis of lumbar region 05/12/2018   Essential hypertension 08/25/2014   Dyspnea 08/25/2014   Morbid obesity (HCC) 08/25/2014   COPD (chronic obstructive pulmonary disease) (HCC) 08/25/2014    Orientation RESPIRATION BLADDER Height & Weight     Self, Time, Situation, Place  O2 (3L nasal cannula) Incontinent Weight: 200 lb (90.7 kg) Height:  4' 11 (149.9 cm)  BEHAVIORAL SYMPTOMS/MOOD NEUROLOGICAL BOWEL NUTRITION STATUS      Continent Diet (See dc summary)  AMBULATORY STATUS COMMUNICATION OF NEEDS Skin   Limited Assist Verbally Normal                       Personal Care Assistance Level of Assistance  Bathing, Feeding, Dressing Bathing Assistance: Limited assistance Feeding assistance: Independent Dressing Assistance: Limited assistance     Functional Limitations Info             SPECIAL CARE FACTORS FREQUENCY  PT (By licensed PT), OT (  By licensed OT)     PT Frequency: 5x/week OT Frequency: 5x/week            Contractures Contractures Info: Not present    Additional Factors Info  Code Status, Allergies Code Status Info: DNR Allergies Info: Atorvastatin, Ceftin (Cefuroxime), Cefuroxime Axetil, Clindamycin Hcl, Clindamycin/lincomycin, Dapagliflozin , Moxifloxacin, Simvastatin , Doxycycline Hyclate, Penicillins           Current Medications (08/26/2023):  This is the  current hospital active medication list Current Facility-Administered Medications  Medication Dose Route Frequency Provider Last Rate Last Admin   arformoterol  (BROVANA ) nebulizer solution 15 mcg  15 mcg Nebulization BID Raenelle Donalda HERO, MD   15 mcg at 08/26/23 0816   aspirin  EC tablet 81 mg  81 mg Oral Daily Kakrakandy, Arshad N, MD   81 mg at 08/26/23 9149   budesonide  (PULMICORT ) nebulizer solution 0.25 mg  0.25 mg Nebulization BID Kakrakandy, Arshad N, MD   0.25 mg at 08/26/23 0816   cholecalciferol  (VITAMIN D3) 25 MCG (1000 UNIT) tablet 1,000 Units  1,000 Units Oral Daily Franky Redia SAILOR, MD   1,000 Units at 08/26/23 0850   docusate sodium  (COLACE) capsule 100 mg  100 mg Oral Daily Franky Redia SAILOR, MD   100 mg at 08/26/23 9148   furosemide  (LASIX ) injection 40 mg  40 mg Intravenous BID Raenelle Donalda HERO, MD   40 mg at 08/26/23 0851   heparin  injection 5,000 Units  5,000 Units Subcutaneous Q8H Hochrein, James, MD   5,000 Units at 08/26/23 9376   ipratropium-albuterol  (DUONEB) 0.5-2.5 (3) MG/3ML nebulizer solution 3 mL  3 mL Nebulization Q2H PRN Franky Redia SAILOR, MD       metoprolol  succinate (TOPROL -XL) 24 hr tablet 50 mg  50 mg Oral Daily Hochrein, James, MD   50 mg at 08/26/23 0850   multivitamin with minerals tablet 1 tablet  1 tablet Oral Daily Ghimire, Shanker M, MD   1 tablet at 08/24/23 0941   pravastatin  (PRAVACHOL ) tablet 10 mg  10 mg Oral QHS Ghimire, Shanker M, MD   10 mg at 08/25/23 2213   revefenacin  (YUPELRI ) nebulizer solution 175 mcg  175 mcg Nebulization Daily Raenelle Donalda HERO, MD   175 mcg at 08/26/23 9183     Discharge Medications: Please see discharge summary for a list of discharge medications.  Relevant Imaging Results:  Relevant Lab Results:   Additional Information SSN: [id number] [place], [place]

## 2023-08-26 NOTE — Progress Notes (Signed)
 Occupational Therapy Treatment Patient Details Name: [name] MRN: [id number] DOB: 02/11/1933 Today's Date: 08/26/2023   History of present illness 89yo female who presented to [facility] ED on 1/8 via EMS with progressive shortness of breath.  She apparently has not been noncompliant with her home O2, was being seen by her PCP with noted SpO2 72% on room air. PMHx significant for HTN, HLD, COPD, CHF, prior CVA without residual deficits (lacunar infarct 10/2020), thyroid  cyst, OA   OT comments  Patient adamant that OT nor PT had not seen her this weekend. Patient stating that she cannot complete ADLs by herself, but lives alone and her sister cannot help her. OT offering to help work her skills with patient stating, why would I do that? It took me a long time to get comfortable. OT gently explaining the reason for OT and that patient would like to regain independence, to which patient agreed, but patient refused to move out of the chair. Agreeable to ADLs at set up level. OT recommendation remains appropriate for HHOT at this time as patient does not appear that she will participate at a different rehab setting.       If plan is discharge home, recommend the following:  A little help with bathing/dressing/bathroom;Assistance with cooking/housework;Assist for transportation   Equipment Recommendations  None recommended by OT    Recommendations for Other Services      Precautions / Restrictions Precautions Precautions: Fall;Other (comment) Precaution Comments: watch sats Restrictions Weight Bearing Restrictions Per Provider Order: No       Mobility Bed Mobility               General bed mobility comments: up in chair    Transfers Overall transfer level: Needs assistance                 General transfer comment: refusing to transfer with OT     Balance                                           ADL either performed or assessed with clinical  judgement   ADL Overall ADL's : Needs assistance/impaired     Grooming: Wash/dry hands;Wash/dry face;Oral care;Sitting;Set up Grooming Details (indicate cue type and reason): refusing to ambulate to complete                   Toilet Transfer Details (indicate cue type and reason): refusing         Functional mobility during ADLs: Set up General ADL Comments: Patient adamant that OT or PT had not seen her this weekend. Patient stating that she cannot complete ADLs by herself, but lives alone and her sister cannot help her. OT offering to help work her skills with patient stating, why would I do that? It took me a long time to get comfortable. OT gently explaining the reason for OT and that patient would like to regain independence, to which patient agreed, but patient refused to move out of the chair. Agreeable to ADLs at set up level. OT recommendation remains appropriate for HHOT at this time as patient does not appear that she will participate at a different rehab setting.    Extremity/Trunk Assessment Upper Extremity Assessment Upper Extremity Assessment: Generalized weakness            Vision   Vision Assessment?: No apparent visual deficits  Perception Perception Perception: Not tested   Praxis Praxis Praxis: Not tested    Cognition Arousal: Alert Behavior During Therapy: Anxious Overall Cognitive Status: Impaired/Different from baseline Area of Impairment: Safety/judgement                         Safety/Judgement: Decreased awareness of safety, Decreased awareness of deficits     General Comments: Patient recognized that she needs help in order to go home, but refuses to go to a facility, accepts that she needs to work on getting stronger, but then refuses to move as it, took a long time to get comfortable and I dont want to ruin that        Exercises      Shoulder Instructions       General Comments VSS on 3L O2    Pertinent  Vitals/ Pain       Pain Assessment Pain Assessment: No/denies pain  Home Living Family/patient expects to be discharged to:: Private residence Living Arrangements: Alone Available Help at Discharge: Family;Friend(s);Available PRN/intermittently Type of Home: House Home Access: Stairs to enter [employer] of Steps: 1 Entrance Stairs-Rails: None Home Layout: One level     Bathroom Shower/Tub: Producer, Television/film/video: Handicapped height Bathroom Accessibility: Yes   Home Equipment: Rollator (4 wheels);Rolling Walker (2 wheels);Shower seat - built in;Grab bars - tub/shower   Additional Comments: home O2 (noncompliant)      Prior Functioning/Environment              Frequency  Min 1X/week        Progress Toward Goals  OT Goals(current goals can now be found in the care plan section)     Acute Rehab OT Goals Patient Stated Goal: to be comfortable OT Goal Formulation: With patient Time For Goal Achievement: 09/07/23 Potential to Achieve Goals: Good  Plan      Co-evaluation                 AM-PAC OT 6 Clicks Daily Activity     Outcome Measure   Help from another person eating meals?: None Help from another person taking care of personal grooming?: A Little Help from another person toileting, which includes using toliet, bedpan, or urinal?: A Little Help from another person bathing (including washing, rinsing, drying)?: A Little Help from another person to put on and taking off regular upper body clothing?: A Little Help from another person to put on and taking off regular lower body clothing?: A Little 6 Click Score: 19    End of Session    OT Visit Diagnosis: Unsteadiness on feet (R26.81);Other (comment) (SOB)   Activity Tolerance Patient limited by fatigue   Patient Left in chair;with call bell/phone within reach;with chair alarm set   Nurse Communication Mobility status        Time: 8964-8947 OT Time Calculation  (min): 17 min  Charges: OT General Charges $OT Visit: 1 Visit OT Treatments $Self Care/Home Management : 8-22 mins  Ronal Gift E. Dabney Dever, OTR/L Acute Rehabilitation Services [phone]   Ronal Gift Salt 08/26/2023, 1:29 PM

## 2023-08-26 NOTE — Progress Notes (Signed)
   08/26/23 0915  Mobility  Activity Refused mobility  Mobility Specialist Start Time (ACUTE ONLY) 0915  Mobility Specialist Stop Time (ACUTE ONLY) 0917  Mobility Specialist Time Calculation (min) (ACUTE ONLY) 2 min   Mobility Specialist: Progress Note  Pt refused mobility session, adamant about not mobilizing despite MS encouragement. - Received and left in chair with all needs met. Call bell within reach. MS will follow up as time permits.   Virgle Boards, BS Mobility Specialist Please contact via SecureChat or Rehab office at [phone].

## 2023-08-26 NOTE — Care Management Important Message (Signed)
 Important Message  Patient Details  Name: [name] MRN: [id number] Date of Birth: October 15, 1932   Important Message Given:  Yes - Medicare IM     Dorena Bodo 08/26/2023, 3:45 PM

## 2023-08-27 DIAGNOSIS — J9601 Acute respiratory failure with hypoxia: Secondary | ICD-10-CM | POA: Diagnosis not present

## 2023-08-27 LAB — BASIC METABOLIC PANEL
Anion gap: 10 (ref 5–15)
BUN: 31 mg/dL — ABNORMAL HIGH (ref 8–23)
CO2: 42 mmol/L — ABNORMAL HIGH (ref 22–32)
Calcium: 8.8 mg/dL — ABNORMAL LOW (ref 8.9–10.3)
Chloride: 87 mmol/L — ABNORMAL LOW (ref 98–111)
Creatinine, Ser: 0.92 mg/dL (ref 0.44–1.00)
GFR, Estimated: 60 mL/min — ABNORMAL LOW (ref >60)
Glucose, Bld: 107 mg/dL — ABNORMAL HIGH (ref 70–99)
Potassium: 3.9 mmol/L (ref 3.5–5.1)
Sodium: 139 mmol/L (ref 135–145)

## 2023-08-27 LAB — MAGNESIUM: Magnesium: 2.2 mg/dL (ref 1.7–2.4)

## 2023-08-27 LAB — PHOSPHORUS: Phosphorus: 4.6 mg/dL (ref 2.5–4.6)

## 2023-08-27 MED ORDER — ROSUVASTATIN CALCIUM 5 MG PO TABS
10.0000 mg | ORAL_TABLET | Freq: Every day | ORAL | Status: DC
Start: 2023-08-27 — End: 2023-08-30
  Administered 2023-08-27 – 2023-08-30 (×4): 10 mg via ORAL
  Filled 2023-08-27 (×4): qty 2

## 2023-08-27 MED ORDER — DAPAGLIFLOZIN PROPANEDIOL 10 MG PO TABS
10.0000 mg | ORAL_TABLET | Freq: Every day | ORAL | Status: DC
  Administered 2023-08-27 – 2023-08-30 (×4): 10 mg via ORAL
  Filled 2023-08-27 (×4): qty 1

## 2023-08-27 MED ORDER — FUROSEMIDE 10 MG/ML IJ SOLN
60.0000 mg | Freq: Two times a day (BID) | INTRAMUSCULAR | Status: DC
  Administered 2023-08-27 – 2023-08-29 (×4): 60 mg via INTRAVENOUS
  Filled 2023-08-27 (×4): qty 6

## 2023-08-27 MED ORDER — FUROSEMIDE 10 MG/ML IJ SOLN
20.0000 mg | Freq: Once | INTRAMUSCULAR | Status: AC
  Administered 2023-08-27: 20 mg via INTRAVENOUS
  Filled 2023-08-27: qty 2

## 2023-08-27 NOTE — Progress Notes (Signed)
 Heart Failure Stewardship Pharmacist Progress Note   PCP: Arloa [name], MD PCP-Cardiologist: Victory LELON [name], MD (Inactive)    HPI:  89 yo F with PMH COPD on 2 L O2, HFpEF, hypertension, hyperlipidemia, stroke, carotid stenosis.  Patient presented to pulmonologist on 1/8 and became hypoxic during a coughing episode and she was referred to the ED. Patient presented to ED with worsening SOB and bilateral LEE and productive cough. Trop 17->155 with EKG showing diffuse ST depression with concern for NSTEMI. Cardiology consulted and she was found to have NSTEMI complicated by flash pulmonary edema. Due to her respiratory status and inability to lay flat, cath was deferred. Plan for aggressive diuresis and BiPAP then revisit cardiac cath, however patient preferred more conservative management and declined further treatment. Respiratory status has improved with diuresis. Likely multifactorial in the setting of HF with elevated BNP and COPD. ECHO from 1/9 showed EF 50-55%, G1DD, elevated LVEDP, possible, apical/apical septal hypokinesis, normal RV function, trivial MR, moderate to severe mitral annular calcification.   Reported that she had numbness and tingling when taking Farxiga  in the past and these sx resolved when she stopped the medication. She previously stated not willing to try Farxiga  or Jardiance in the future. However, cardiology discussed with her again on 1/14 and she is now willing to retry this.  Denies shortness of breath at this time. Has received her first dose of Farxiga  this morning and is hopeful that she can tolerate it this time. She reports she was still having some numbness and tingling in her fingers after stopping this medication. Later determined it was also cost that was a factor with stopping the medication. Trace LE edema on exam.   Current HF Medications: Diuretic: furosemide  60 mg IV BID Beta Blocker: metoprolol  succinate 50 mg daily SGLT2i: Farxiga  10 mg  daily  Prior to admission HF Medications: Diuretic: furosemide  20 mg daily Beta blocker: metoprolol  succinate 25 mg daily   Pertinent Lab Values: Serum creatinine 0.92, BUN 31, Potassium 3.9, Sodium 139, BNP 408.9, Magnesium 2.2, A1c 6.1%  Vital Signs: Weight: 200 lbs (admission weight: 200 lbs) Blood pressure: 120-140/50-70s  Heart rate: 70-90s  I/O: net -0.6 L yesterday; net -3.6 L since admission  Medication Assistance / Insurance Benefits Check: Does the patient have prescription insurance?  Yes Type of insurance plan: [employer]  Outpatient Pharmacy:  Prior to admission [facility] Is the patient willing to use [facility] TOC pharmacy at discharge? Yes Is the patient willing to transition their outpatient pharmacy to utilize a [facility] outpatient pharmacy?   No    Assessment: 1. Acute on chronic HFpEF (LVEF 50-55%), likely due to ICM. NYHA class II symptoms. - Continue metoprolol  XL 50 mg daily - Agree with increasing to furosemide  60 mg IV BID with minimal output yesterday. Daily weights. Strict I/O. Keep K>4 and Mg>2. - Avoid spironolactone due to hx of high normal potassium - Agree with restarting Farxiga  10 mg daily today. After cardiology discussed with patient again, she is willing to retry this. If she tolerates this, can investigate patient assistance options to help with affordability.    Plan: 1) Medication changes recommended at this time: - Agree with changes - Ensure daily weights (weight has not been documented since admission however daily weights order placed on 1/13)   2) Patient assistance: - Jardiance/Farxiga /Entresto is $90 per 30 day supply - She is eligible for [employer] Grant to cover Farxiga  cost if she can tolerate this.  Will apply for the grant tomorrow as long as she is not having side effects.    3)  Education  - Initial education completed - Full education to be completed prior to discharge   Duwaine Plant,  PharmD, BCPS Heart Failure Stewardship Pharmacist Phone [phone]

## 2023-08-27 NOTE — Progress Notes (Addendum)
 Patient Name: [name] Date of Encounter: 08/27/2023 [facility] Cardiologist: [name] Linda DOUGLAS, MD (Inactive)   Interval Summary  .    89 yr old female with PMH of HFpEF, HTN, HLD, CVA, oxygen dependent COPD, who is currently admitted for acute on chronic hypoxic respiratory failure 2/2 pulmonary edema, acute encephalopathy 2/2 hypoxia/hypercarbia. Course complicated by NSTEMI with Hs trop 17 >155 which cardiology was consulted. She was treated with medical management and declined LHC for further ischemic workup.   Patient states she feels well, does not quite remember what cardiology team told her in the past few days. She denied chest pain, felt SOB is overall stable, able tolerate walking to the bathroom. She states she takes lasix  20mg  daily at home, occasionally skipped a dose, drinks a lot of water, denied high sodium /salt consumption.   Vital Signs .    Vitals:   08/26/23 1952 08/26/23 2000 08/26/23 2008 08/27/23 0029  BP:  (!) 102/49  (!) 140/74  Pulse:  79 81 81  Resp:  (!) 25 (!) 23 18  Temp: 98.1 F (36.7 C) 98 F (36.7 C)  97.8 F (36.6 C)  TempSrc: Oral Oral  Oral  SpO2:  93% 92% 95%  Weight:      Height:        Intake/Output Summary (Last 24 hours) at 08/27/2023 0832 Last data filed at 08/26/2023 2330 Gross per 24 hour  Intake --  Output 300 ml  Net -300 ml      08/21/2023    5:23 PM 05/02/2023   10:49 AM 10/01/2022   10:51 AM  Last 3 Weights  Weight (lbs) 200 lb 210 lb 9.6 oz 199 lb  Weight (kg) 90.719 kg 95.528 kg 90.266 kg      Telemetry/ECG    Sinus rhythm 70-80s , occasional PACs - Personally Reviewed  Physical Exam .   GEN: No acute distress.   Neck: No JVD Cardiac: RRR, no murmurs, rubs, or gallops.  Respiratory: wheezing bilaterally, diminished at base bilaterally, on 3LNC  GI: Soft, nontender, non-distended  MS: Trace BLE edema  Assessment & Plan .     Acute on chronic HFpEF - presented initially for increased SOB and  acute hypoxic respiratory failure  - BNP 323 >408 , POA - CXR 1/9 showed increased interstitial consolidation consistent with interstitial edema. - Echo from 08/22/23 showed LVEF 50-55%, no RWMA/possible apical/apical septal hypokinesis, grade I DD, normal RV, trivial MRI, mod to severe MAC, IVC normal (LVEF was 60-65% on Echo 03/21/22) - On PTA Lasix  20mg  daily, now on IV Lasix  40mg  BID, Net -3.6L, weight not recorded, Cr 0.92/overall stable,  clinically mildly hypervolemic, continue current dose of Lasix , please tract accurate weight and I&O daily  - GDMT: continue Toprol  XL 50mg  daily, on Farxiga  in the past and stopped due to cost and paresthesia of finger which remains despite stopping, she wants to re-try Farxiga , will start at 10mg  daily   NSTEMI  - Hs trop 17> 155 - EKG initially revealing diffuse ST depression - Echo as above - LHC was recommended but declined by the patient  - LDL 96, A1C 6.1% from 1/9 - will continue medical therapy: competed 48 hours heparin  gtt; continue PTA ASA 81mg  daily, increased PTA Toprol  XL to 50mg  daily, recommend change PTA pravastatin  10mg  daily to crestor  10mg  daily if she agrees   Acute on chronic hypoxic and hypercarbic respiratory failure  COPD with exacerbation  HTN HLD  Hx  of CVA - per IM  For questions or updates, please contact [facility] Please consult [email or web address] for contact info under     Signed, Linda Zhao, NP   History and all data above reviewed.  Patient examined.  I agree with the findings as above.  The patient denies any chest pain.  Her breathing is OK but not at baseline.  Still on 5 liters.  The patient exam reveals COR:RRR  ,  Lungs: Decreased breath sounds with scattered wheezing  ,  Abd: Positive bowel sounds, no rebound no guarding, Ext Mild edema  .  All available labs, radiology testing, previous records reviewed. Agree with documented assessment and plan.   NSTEMI:  She seems to have some short term memory issues  but she and I had a long conversation today about her presenting issue.  She almost definitely has obstructive CAD but her NSTEMI might have been precipitated by the acute pulmonary process.  She has otherwise not had symptoms.  EF was low normal.  Trop elevation was low.  EKG was impressive but it is very reasonable to pursue medical management for this patient and reserve invasive evaluation for any ongoing symptoms in the future. I also called her sister to have this discussion and we are all in agreement.    [name]  11:12 AM  08/27/2023

## 2023-08-27 NOTE — Progress Notes (Signed)
 PROGRESS NOTE        PATIENT DETAILS Name: [name] Age: 89 y.o. Sex: female Date of Birth: 1933-02-14 Admit Date: 08/21/2023 Admitting Physician Redia LOISE Cleaver, MD ERE:[id number], [name], MD  Brief Summary: Patient is a 89 y.o.  female with history of COPD on 2 L of oxygen at home, chronic HFpEF, HTN, HLD, CVA-who presented with shortness of breath-thought to be due to flash pulm edema-post admission patient became somnolent and was found to have hypercarbia.  She was also found to have possible non-STEMI.  She was placed on BiPAP-IV heparin -IV Lasix  -bronchodilators-and has clinically improved.  See below for further details.  Significant events: 1/8>> admit to [facility]-short of breath-hypoxic-likely from flash pulm edema 1/9 >> somnolent-hypercarbia on ABG-BiPAP  1/10>> back on 2 L of oxygen  Significant studies: 1/8>> CXR: Central vascular congestion 1/9>> CXR: Increased interstitial edema 1/9>> bilateral lower extremity Doppler: No DVT 1/9>> echo: EF 50-55%, grade 1 diastolic dysfunction.  Significant microbiology data: 1/8>> COVID/influenza/RSV PCR: Negative 1/9>> respiratory virus panel: Negative  Procedures: None  Consults: Cardiology Pulmonology  Subjective: Patient sitting up in recliner, got more short of breath last night, had difficulty laying flat night of 08/26/2023, no chest pain or abdominal pain.   Objective: Vitals: Blood pressure (!) 140/74, pulse 81, temperature 97.8 F (36.6 C), temperature source Oral, resp. rate 18, height 4' 11 (1.499 m), weight 90.7 kg, SpO2 95%.   Exam:  Awake Alert, No new F.N deficits, Normal affect [place].AT,PERRAL Supple Neck, No JVD,   Symmetrical Chest wall movement, Good air movement bilaterally, bibasilar crackles more pronounced on Sep 12, 2023 with trace leg edema RRR,No Gallops, Rubs or new Murmurs,  +ve B.Sounds, Abd Soft, No tenderness,     Assessment/Plan:  Acute metabolic  encephalopathy Secondary to hypercarbia/hypoxia Markedly better after treatment of underlying etiologies-completely awake and alert.  Acute on chronic hypoxic and hypercarbic respiratory failure Felt to be primarily due to flash pulm edema-COPD exacerbation if any was mild.  Initially required BiPAP at night now on 2 to 3 L nasal cannula oxygen, being diuresed with IV Lasix , cardiology following, initially improved however night of 08/26/2023 fluid overload worsened, Lasix  dose adjusted upwards on 09-12-23.  Monitor closely.  Flash pulm edema Acute on chronic HFpEF EF 60% Significant improvement in volume status  Cardiology on board continue IV Lasix  and beta-blocker Follow weights/electrolytes  Non-STEMI No chest pain-but potentially causing above She does not want to pursue LHC-is okay with medical management IV heparin  discontinued after 48 hours on 1/11 On aspirin /statin/beta-blocker  HTN BP stable Beta-blocker Amlodipine  remains on hold  COPD exacerbation Has resolved Lungs are completely clear on exam Quick prednisone  taper Continue bronchodilators.  Prior history of CVA No residual deficits  aspirin /statin  Morbid Obesity: Estimated body mass index is 40.4 kg/m as calculated from the following:   Height as of this encounter: 4' 11 (1.499 m).   Weight as of this encounter: 90.7 kg.   Code status:   Code Status: Limited: Do not attempt resuscitation (DNR) -DNR-LIMITED -Do Not Intubate/DNI    DVT Prophylaxis:IV heparin     Family Communication: Carliss Essex  [phone] left VM on 08/26/2023   Disposition Plan: Status is: Inpatient Remains inpatient appropriate because: Severity of illness   Planned Discharge Destination:Home health   Diet: Diet Order  Diet regular Room service appropriate? Yes with Assist; Fluid consistency: Thin  Diet effective now                    MEDICATIONS: Scheduled Meds:  arformoterol   15 mcg  Nebulization BID   aspirin  EC  81 mg Oral Daily   budesonide   0.25 mg Nebulization BID   cholecalciferol   1,000 Units Oral Daily   dapagliflozin  propanediol  10 mg Oral Daily   docusate sodium   100 mg Oral Daily   furosemide   20 mg Intravenous Once   furosemide   60 mg Intravenous BID   heparin   5,000 Units Subcutaneous Q8H   metoprolol  succinate  50 mg Oral Daily   multivitamin with minerals  1 tablet Oral Daily   revefenacin   175 mcg Nebulization Daily   rosuvastatin   10 mg Oral Daily   Continuous Infusions:   PRN Meds:.ipratropium-albuterol    I have personally reviewed following labs and imaging studies  LABORATORY DATA:  Recent Labs  Lab 08/21/23 2245 08/22/23 0500 08/23/23 0624 08/24/23 0559 08/25/23 0614  WBC 9.7 8.8 13.4* 9.6 8.0  HGB 13.8 14.8 13.1 13.4 13.4  HCT 45.8 49.2* 42.1 42.7 43.8  PLT 144* 169 156 142* 118*  MCV 100.7* 101.4* 98.1 95.5 99.1  MCH 30.3 30.5 30.5 30.0 30.3  MCHC 30.1 30.1 31.1 31.4 30.6  RDW 14.6 14.8 14.7 15.3 15.0  LYMPHSABS 0.3*  --   --   --  2.0  MONOABS 0.1  --   --   --  0.8  EOSABS 0.0  --   --   --  0.1  BASOSABS 0.0  --   --   --  0.0    Recent Labs  Lab 08/21/23 1727 08/22/23 0500 08/22/23 1603 08/22/23 1845 08/23/23 0624 08/24/23 0740 08/25/23 0614 08/27/23 0434  NA 141  --  139 140 139 141 141 139  K 4.4  --  5.4* 5.0 4.6 3.5 4.1 3.9  CL 94*  --  93* 91* 89* 88* 90* 87*  CO2 37*  --  31 41* 41* 43* 39* 42*  ANIONGAP 10  --  15 8 9 10 12 10   GLUCOSE 111*  --  105* 191* 108* 105* 107* 107*  BUN 22  --  22 21 22  26* 26* 31*  CREATININE 0.72  --  0.93 1.15* 0.92 1.06* 1.06* 0.92  AST  --   --  108*  --   --   --   --   --   ALT  --   --  22  --   --   --   --   --   ALKPHOS  --   --  52  --   --   --   --   --   BILITOT  --   --  1.1  --   --   --   --   --   ALBUMIN  --   --  3.5  --   --   --   --   --   DDIMER  --  0.76*  --   --   --   --   --   --   PROCALCITON  --   --   --  <0.10  --   --   --   --    TSH  --  0.984  --   --   --   --   --   --  HGBA1C  --  6.1*  --   --   --   --   --   --   BNP 323.7*  --   --   --   --   --  408.9*  --   MG  --   --   --   --  2.2  --  2.3 2.2  PHOS  --   --   --   --  2.9  --  5.0* 4.6  CALCIUM  9.2  --  8.8* 9.0 9.2 8.8* 8.9 8.8*    Lab Results  Component Value Date   CHOL 161 08/22/2023   HDL 56 08/22/2023   LDLCALC 96 08/22/2023   TRIG 46 08/22/2023   CHOLHDL 2.9 08/22/2023      Recent Labs  Lab 08/21/23 1727 08/22/23 0500 08/22/23 1603 08/22/23 1845 08/23/23 0624 08/24/23 0740 08/25/23 0614 08/27/23 0434  DDIMER  --  0.76*  --   --   --   --   --   --   PROCALCITON  --   --   --  <0.10  --   --   --   --   TSH  --  0.984  --   --   --   --   --   --   HGBA1C  --  6.1*  --   --   --   --   --   --   BNP 323.7*  --   --   --   --   --  408.9*  --   MG  --   --   --   --  2.2  --  2.3 2.2  CALCIUM  9.2  --    < > 9.0 9.2 8.8* 8.9 8.8*   < > = values in this interval not displayed.    RADIOLOGY STUDIES/RESULTS: No results found.    LOS: 5 days   Signature  -    Lavada Stank M.D on 08/27/2023 at 9:45 AM   -  To page go to [email or web address]

## 2023-08-27 NOTE — Plan of Care (Signed)

## 2023-08-27 NOTE — TOC Progression Note (Signed)
 Transition of Care [facility]) - Progression Note    Patient Details  Name: [name] MRN: [id number] Date of Birth: 09-08-1932  [facility]) CM/SW Contact  Inocente GORMAN Kindle, LCSW Phone Number: 08/27/2023, 12:40 PM  Clinical Narrative:    CSW submitted for insurance approval for [employer], Ref# [id number].    Expected Discharge Plan: Skilled Nursing Facility Barriers to Discharge: Continued Medical Work up, English As A Second Language Teacher, SNF Pending bed offer  Expected Discharge Plan and Services In-house Referral: Clinical Social Work   Post Acute Care Choice: Skilled Nursing Facility Living arrangements for the past 2 months: Single Family Home                                       Social Determinants of Health (SDOH) Interventions SDOH Screenings   Food Insecurity: No Food Insecurity (08/23/2023)  Housing: Low Risk  (08/23/2023)  Transportation Needs: No Transportation Needs (08/23/2023)  Utilities: Not At Risk (08/23/2023)  Depression (PHQ2-9): Low Risk  (12/12/2020)  Social Connections: Unknown (08/23/2023)  Tobacco Use: Medium Risk (08/22/2023)    Readmission Risk Interventions     No data to display

## 2023-08-27 NOTE — Progress Notes (Signed)
 Physical Therapy Treatment Patient Details Name: [name] MRN: [id number] DOB: Apr 23, 1933 Today's Date: 08/27/2023   History of Present Illness 89yo female who presented to [facility] ED on 1/8 via EMS with progressive shortness of breath.  She apparently has not been noncompliant with her home O2, was being seen by her PCP with noted SpO2 72% on room air. PMHx significant for HTN, HLD, COPD, CHF, prior CVA without residual deficits (lacunar infarct 10/2020), thyroid  cyst, OA    PT Comments  Pt received sitting in the recliner and agreeable to session with encouragement. Pt reports urinary incontinence with coughing and declines ambulation due to not wanting to remove purewick. Pt agreeable to standing trials and BLE exercises.  Pt able to stand and perform static standing marches with CGA for safety. Pt requires a seated rest break due to fatigue and SpO2 dropping as low as 86%, however is able to perform LAQ while seated. Pt continues to benefit from PT services to progress toward functional mobility goals.    If plan is discharge home, recommend the following: A little help with walking and/or transfers;A little help with bathing/dressing/bathroom;Help with stairs or ramp for entrance;Assistance with cooking/housework;Assist for transportation   Can travel by private Psychologist, Clinical (4 wheels)    Recommendations for Other Services       Precautions / Restrictions Precautions Precautions: Fall;Other (comment) Precaution Comments: watch sats Restrictions Weight Bearing Restrictions Per Provider Order: No     Mobility  Bed Mobility               General bed mobility comments: Pt in recliner at beginning and end of session    Transfers Overall transfer level: Needs assistance Equipment used: Rolling walker (2 wheels) Transfers: Sit to/from Stand Sit to Stand: Contact guard assist           General transfer comment: STS from recliner x2  with heavy BUE support for power up. CGA for safety    Ambulation/Gait             Pre-gait activities: static standing marches General Gait Details: Pt declined      Balance Overall balance assessment: Needs assistance Sitting-balance support: No upper extremity supported, Feet supported Sitting balance-Leahy Scale: Good     Standing balance support: Bilateral upper extremity supported, During functional activity, Reliant on assistive device for balance Standing balance-Leahy Scale: Poor Standing balance comment: with RW support                            Cognition Arousal: Alert Behavior During Therapy: Flat affect Overall Cognitive Status: Impaired/Different from baseline                                 General Comments: Pt requires increased cues and education to participate        Exercises General Exercises - Lower Extremity Long Arc Quad: AROM, Seated, Both, 15 reps Hip Flexion/Marching: AROM, Standing, Both, 10 reps (x2)    General Comments General comments (skin integrity, edema, etc.): Pt on 2L throughout session with SpO2 dropping as low as 86% with mobility requiring seated rest break and cues for pursed lip breathing      Pertinent Vitals/Pain Pain Assessment Pain Assessment: No/denies pain     PT Goals (current goals can now be found in the care plan  section) Acute Rehab PT Goals Patient Stated Goal: home PT Goal Formulation: With patient Time For Goal Achievement: 09/07/23 Progress towards PT goals: Progressing toward goals (slowly, self-limiting)    Frequency    Min 1X/week       AM-PAC PT 6 Clicks Mobility   Outcome Measure  Help needed turning from your back to your side while in a flat bed without using bedrails?: A Little Help needed moving from lying on your back to sitting on the side of a flat bed without using bedrails?: A Little Help needed moving to and from a bed to a chair (including a  wheelchair)?: A Little Help needed standing up from a chair using your arms (e.g., wheelchair or bedside chair)?: A Little Help needed to walk in hospital room?: A Little Help needed climbing 3-5 steps with a railing? : A Lot 6 Click Score: 17    End of Session Equipment Utilized During Treatment: Gait belt;Oxygen Activity Tolerance: Patient limited by fatigue;Patient tolerated treatment well Patient left: with call bell/phone within reach;in chair;with chair alarm set Nurse Communication: Mobility status PT Visit Diagnosis: Difficulty in walking, not elsewhere classified (R26.2);Muscle weakness (generalized) (M62.81)     Time: 8950-8889 PT Time Calculation (min) (ACUTE ONLY): 21 min  Charges:    $Therapeutic Activity: 8-22 mins PT General Charges $$ ACUTE PT VISIT: 1 Visit                     Darryle George, PTA Acute Rehabilitation Services Secure Chat Preferred  [phone]    Darryle George 08/27/2023, 11:39 AM

## 2023-08-28 ENCOUNTER — Inpatient Hospital Stay (HOSPITAL_COMMUNITY): Payer: Medicare Other

## 2023-08-28 DIAGNOSIS — J9601 Acute respiratory failure with hypoxia: Secondary | ICD-10-CM | POA: Diagnosis not present

## 2023-08-28 LAB — BASIC METABOLIC PANEL
Anion gap: 12 (ref 5–15)
BUN: 30 mg/dL — ABNORMAL HIGH (ref 8–23)
CO2: 44 mmol/L — ABNORMAL HIGH (ref 22–32)
Calcium: 9.2 mg/dL (ref 8.9–10.3)
Chloride: 84 mmol/L — ABNORMAL LOW (ref 98–111)
Creatinine, Ser: 0.97 mg/dL (ref 0.44–1.00)
GFR, Estimated: 56 mL/min — ABNORMAL LOW (ref >60)
Glucose, Bld: 109 mg/dL — ABNORMAL HIGH (ref 70–99)
Potassium: 3.2 mmol/L — ABNORMAL LOW (ref 3.5–5.1)
Sodium: 140 mmol/L (ref 135–145)

## 2023-08-28 LAB — MAGNESIUM: Magnesium: 2.3 mg/dL (ref 1.7–2.4)

## 2023-08-28 MED ORDER — POTASSIUM CHLORIDE CRYS ER 20 MEQ PO TBCR
40.0000 meq | EXTENDED_RELEASE_TABLET | Freq: Once | ORAL | Status: AC
  Administered 2023-08-28: 40 meq via ORAL
  Filled 2023-08-28: qty 2

## 2023-08-28 MED ORDER — METOLAZONE 2.5 MG PO TABS
2.5000 mg | ORAL_TABLET | Freq: Once | ORAL | Status: AC
  Administered 2023-08-28: 2.5 mg via ORAL
  Filled 2023-08-28: qty 1

## 2023-08-28 NOTE — Progress Notes (Addendum)
 Heart Failure Stewardship Pharmacist Progress Note   PCP: Roselind Congo, MD PCP-Cardiologist: Mickiel Albany, MD (Inactive)    HPI:  89 yo F with PMH COPD on 2 L O2, HFpEF, hypertension, hyperlipidemia, stroke, carotid stenosis.  Patient presented to pulmonologist on 1/8 and became hypoxic during a coughing episode and she was referred to the ED. Patient presented to ED with worsening SOB and bilateral LEE and productive cough. Trop 17->155 with EKG showing diffuse ST depression with concern for NSTEMI. Cardiology consulted and she was found to have NSTEMI complicated by flash pulmonary edema. Due to her respiratory status and inability to lay flat, cath was deferred. Plan for aggressive diuresis and BiPAP then revisit cardiac cath, however patient preferred more conservative management and declined further treatment. Respiratory status has improved with diuresis. Likely multifactorial in the setting of HF with elevated BNP and COPD. ECHO from 1/9 showed EF 50-55%, G1DD, elevated LVEDP, possible, apical/apical septal hypokinesis, normal RV function, trivial MR, moderate to severe mitral annular calcification.   Reported that she had numbness and tingling when taking Farxiga  in the past and these sx resolved when she stopped the medication. She previously stated not willing to try Farxiga  or Jardiance in the future. However, cardiology discussed with her again on 1/14 and she is now willing to retry this. She reports she was still having some numbness and tingling in her fingers after stopping this medication. Later determined it was also cost that was a factor with stopping the medication.  Denies shortness of breath or chest pain. States she still has a cough and chest congestion. Trace LEE on exam. Is not feeling any side effects with the medication adjustments from yesterday. No new questions or concerns.   Current HF Medications: Diuretic: furosemide  60 mg IV BID Beta Blocker:  metoprolol  succinate 50 mg daily SGLT2i: Farxiga  10 mg daily  Prior to admission HF Medications: Diuretic: furosemide  20 mg daily Beta blocker: metoprolol  succinate 25 mg daily   Pertinent Lab Values: Serum creatinine 0.97, BUN 30, Potassium 3.2, Sodium 140, BNP 408.9, Magnesium 2.3, A1c 6.1%  Vital Signs: Weight: 199 lbs (admission weight: 200 lbs) Blood pressure: 120/50s  Heart rate: 60-80s  I/O: net -1.2 L yesterday; net -4.8 L since admission  Medication Assistance / Insurance Benefits Check: Does the patient have prescription insurance?  Yes Type of insurance plan: [employer]   Outpatient Pharmacy:  Prior to admission outpatient [facility]: [facility] Is the patient willing to use [facility] TOC pharmacy at discharge? Yes, if not being discharged to SNF Is the patient willing to transition their outpatient pharmacy to utilize a [facility] outpatient pharmacy?   No    Assessment: 1. Acute on chronic HFpEF (LVEF 50-55%), likely due to ICM. NYHA class II symptoms. - Continue metoprolol  XL 50 mg daily - Continue furosemide  60 mg IV BID. Output improved. May be close to transitioning to PO lasix . Ensure continued daily weights. Strict I/O. Keep K>4 and Mg>2. KCl 40 mEq x 2 given for replacement. - Avoid spironolactone due to hx of high normal potassium - Continue Farxiga  10 mg daily today. She seems to be eligible for a [employer] grant but she is being discharged to SNF. Recommend applying for grant once home since this needs to be used by a certain time from enrollment to stay active with the grant.    Plan: 1) Medication changes recommended at this time: - May be close to transitioning to PO lasix   2) Patient assistance: - Jardiance/Farxiga /Entresto is $90 per 30 day supply - She is eligible for [employer] Grant to cover Farxiga  cost. Since she is being discharged to SNF, recommend applying for grant at follow up once discharged from SNF. The grant needs to be used  within a certain period of time from enrollment to stay active and she is unsure how long she will need to be at [facility].    3)  Education  - Patient has been educated on current HF medications and potential additions to HF medication regimen - Patient verbalizes understanding that over the next few months, these medication doses may change and more medications may be added to optimize HF regimen - Patient has been educated on basic disease state pathophysiology and goals of therapy    Jerilyn Monte, PharmD, BCPS Heart Failure Stewardship Pharmacist Phone [phone]

## 2023-08-28 NOTE — Progress Notes (Signed)
 Heart Failure Navigator Progress Note  Assessed for Heart & Vascular TOC clinic readiness.  Patient does not meet criteria due to EF 50-55%, has a scheduled [facility] appointment on 09/12/2023. .   Navigator will sign off at this time.   Randie Bustle, BSN, Scientist, clinical (histocompatibility and immunogenetics) Only

## 2023-08-28 NOTE — Progress Notes (Signed)
 PROGRESS NOTE        PATIENT DETAILS Name: [name] Age: 89 y.o. Sex: female Date of Birth: 02-19-1933 Admit Date: 08/21/2023 Admitting Physician Angelene Kelly, MD PPI:[id number], Amiel Kalata, MD  Brief Summary: Patient is a 89 y.o.  female with history of COPD on 2 L of oxygen at home, chronic HFpEF, HTN, HLD, CVA-who presented with shortness of breath-thought to be due to flash pulm edema-post admission patient became somnolent and was found to have hypercarbia.  She was also found to have possible non-STEMI.  She was placed on BiPAP-IV heparin -IV Lasix  -bronchodilators-and has clinically improved.  See below for further details.  Significant events: 1/8>> admit to [facility]-short of breath-hypoxic-likely from flash pulm edema 1/9 >> somnolent-hypercarbia on ABG-BiPAP  1/10>> back on 2 L of oxygen  Significant studies: 1/8>> CXR: Central vascular congestion 1/9>> CXR: Increased interstitial edema 1/9>> bilateral lower extremity Doppler: No DVT 1/9>> echo: EF 50-55%, grade 1 diastolic dysfunction.  Significant microbiology data: 1/8>> COVID/influenza/RSV PCR: Negative 1/9>> respiratory virus panel: Negative  Procedures: None  Consults: Cardiology Pulmonology  Subjective: Patient sitting in recliner shortness of breath has improved this morning but still little short of breath when she lays flat, no headache chest or abdominal pain.  No focal weakness.  Objective: Vitals: Blood pressure (!) 125/52, pulse 82, temperature 98.1 F (36.7 C), temperature source Oral, resp. rate 20, height 4\' 11"  (1.499 m), weight 90.7 kg, SpO2 94%.   Exam:  Awake Alert, at times minimally confused, no new F.N deficits, Normal affect [place].AT,PERRAL Supple Neck, No JVD,   Symmetrical Chest wall movement, Good air movement bilaterally, has bibasilar crackles with trace leg edema RRR,No Gallops, Rubs or new Murmurs,  +ve B.Sounds, Abd Soft, No tenderness,      Assessment/Plan:  Acute metabolic encephalopathy Secondary to hypercarbia/hypoxia Markedly better after treatment of underlying etiologies-completely awake and alert.  Acute on chronic hypoxic and hypercarbic respiratory failure Felt to be primarily due to flash pulm edema-COPD exacerbation if any was mild.  Initially required BiPAP at night now on 2 to 3 L nasal cannula oxygen, being diuresed with IV Lasix , cardiology following, initially improved however night of 08/26/2023 fluid overload worsened, Lasix  dose adjusted upwards on 08/27/2023, improved on 08/28/2023 but still has crackles continue IV Lasix  and monitor electrolytes  Flash pulm edema Acute on chronic HFpEF EF 60% Significant improvement in volume status  Cardiology on board continue IV Lasix  and beta-blocker Follow weights/electrolytes  Non-STEMI No chest pain-but potentially causing above She does not want to pursue LHC-is okay with medical management IV heparin  discontinued after 48 hours on 1/11 On aspirin /statin/beta-blocker  HTN BP stable Beta-blocker Amlodipine  remains on hold  COPD exacerbation Has resolved Lungs are completely clear on exam Quick prednisone  taper Continue bronchodilators.  Prior history of CVA No residual deficits  aspirin /statin  Morbid Obesity: Estimated body mass index is 40.39 kg/m as calculated from the following:   Height as of this encounter: 4\' 11"  (1.499 m).   Weight as of this encounter: 90.7 kg.   Code status:   Code Status: Limited: Do not attempt resuscitation (DNR) -DNR-LIMITED -Do Not Intubate/DNI    DVT Prophylaxis:IV heparin     Family Communication: Anola Basques  [phone] left VM on 08/26/2023   Disposition Plan: Status is: Inpatient Remains inpatient appropriate because: Severity of illness   Planned Discharge Destination:Home  health   Diet: Diet Order             Diet regular Room service appropriate? Yes with Assist; Fluid  consistency: Thin  Diet effective now                    MEDICATIONS: Scheduled Meds:  arformoterol   15 mcg Nebulization BID   aspirin  EC  81 mg Oral Daily   budesonide   0.25 mg Nebulization BID   cholecalciferol   1,000 Units Oral Daily   dapagliflozin  propanediol  10 mg Oral Daily   docusate sodium   100 mg Oral Daily   furosemide   60 mg Intravenous BID   heparin   5,000 Units Subcutaneous Q8H   metoprolol  succinate  50 mg Oral Daily   multivitamin with minerals  1 tablet Oral Daily   potassium chloride   40 mEq Oral Once   revefenacin   175 mcg Nebulization Daily   rosuvastatin   10 mg Oral Daily   Continuous Infusions:   PRN Meds:.ipratropium-albuterol    I have personally reviewed following labs and imaging studies  LABORATORY DATA:  Recent Labs  Lab 08/21/23 2245 08/22/23 0500 08/23/23 0624 08/24/23 0559 08/25/23 0614  WBC 9.7 8.8 13.4* 9.6 8.0  HGB 13.8 14.8 13.1 13.4 13.4  HCT 45.8 49.2* 42.1 42.7 43.8  PLT 144* 169 156 142* 118*  MCV 100.7* 101.4* 98.1 95.5 99.1  MCH 30.3 30.5 30.5 30.0 30.3  MCHC 30.1 30.1 31.1 31.4 30.6  RDW 14.6 14.8 14.7 15.3 15.0  LYMPHSABS 0.3*  --   --   --  2.0  MONOABS 0.1  --   --   --  0.8  EOSABS 0.0  --   --   --  0.1  BASOSABS 0.0  --   --   --  0.0    Recent Labs  Lab 08/21/23 1727 08/22/23 0500 08/22/23 1603 08/22/23 1845 08/23/23 0624 08/24/23 0740 08/25/23 0614 08/27/23 0434  NA 141  --  139 140 139 141 141 139  K 4.4  --  5.4* 5.0 4.6 3.5 4.1 3.9  CL 94*  --  93* 91* 89* 88* 90* 87*  CO2 37*  --  31 41* 41* 43* 39* 42*  ANIONGAP 10  --  15 8 9 10 12 10   GLUCOSE 111*  --  105* 191* 108* 105* 107* 107*  BUN 22  --  22 21 22  26* 26* 31*  CREATININE 0.72  --  0.93 1.15* 0.92 1.06* 1.06* 0.92  AST  --   --  108*  --   --   --   --   --   ALT  --   --  22  --   --   --   --   --   ALKPHOS  --   --  52  --   --   --   --   --   BILITOT  --   --  1.1  --   --   --   --   --   ALBUMIN  --   --  3.5  --   --    --   --   --   DDIMER  --  0.76*  --   --   --   --   --   --   PROCALCITON  --   --   --  <0.10  --   --   --   --  TSH  --  0.984  --   --   --   --   --   --   HGBA1C  --  6.1*  --   --   --   --   --   --   BNP 323.7*  --   --   --   --   --  408.9*  --   MG  --   --   --   --  2.2  --  2.3 2.2  PHOS  --   --   --   --  2.9  --  5.0* 4.6  CALCIUM  9.2  --  8.8* 9.0 9.2 8.8* 8.9 8.8*    Lab Results  Component Value Date   CHOL 161 08/22/2023   HDL 56 08/22/2023   LDLCALC 96 08/22/2023   TRIG 46 08/22/2023   CHOLHDL 2.9 08/22/2023      Recent Labs  Lab 08/21/23 1727 08/22/23 0500 08/22/23 1603 08/22/23 1845 08/23/23 0624 08/24/23 0740 08/25/23 0614 08/27/23 0434  DDIMER  --  0.76*  --   --   --   --   --   --   PROCALCITON  --   --   --  <0.10  --   --   --   --   TSH  --  0.984  --   --   --   --   --   --   HGBA1C  --  6.1*  --   --   --   --   --   --   BNP 323.7*  --   --   --   --   --  408.9*  --   MG  --   --   --   --  2.2  --  2.3 2.2  CALCIUM  9.2  --    < > 9.0 9.2 8.8* 8.9 8.8*   < > = values in this interval not displayed.    RADIOLOGY STUDIES/RESULTS: No results found.    LOS: 6 days   Signature  -    Lynnwood Sauer M.D on 08/28/2023 at 8:02 AM   -  To page go to [email or web address]

## 2023-08-28 NOTE — Progress Notes (Addendum)
 Patient Name: [name] Date of Encounter: 08/28/2023 [facility] Cardiologist: Mickiel Albany, MD (Inactive)   Interval Summary  .    89 yr old female with PMH of HFpEF, HTN, HLD, CVA, oxygen dependent COPD, who is currently admitted for acute on chronic hypoxic respiratory failure 2/2 pulmonary edema, acute encephalopathy 2/2 hypoxia/hypercarbia. Course complicated by NSTEMI with Hs trop 17 >155 which cardiology was consulted. She was treated with medical management and declined LHC for further ischemic workup.   Patient states her pulse oximetry machine kept beeping sometimes when she is sitting there quietly. She does not understand why. She felt much better overall, denied any worsening of SOB, able to walk to bathroom without getting too winded, no chest pain. She states she does not have memory issue, it's just old age. She states she has agreed with medication management for her heart disease and does not wish invasive cardiac workup. Staff RN in room witnessing our conversation. She offered no further concerns at the end of encounter. She said she may come up with more questions later.   Vital Signs .    Vitals:   08/27/23 1957 08/27/23 1958 08/28/23 0500 08/28/23 0612  BP:  (!) 130/57  (!) 125/52  Pulse: 81 79  82  Resp: 19 18  20   Temp:  98.1 F (36.7 C)  98.1 F (36.7 C)  TempSrc:  Oral  Oral  SpO2: 90% 91%  94%  Weight:   90.7 kg   Height:        Intake/Output Summary (Last 24 hours) at 08/28/2023 0825 Last data filed at 08/28/2023 0424 Gross per 24 hour  Intake 680 ml  Output 1850 ml  Net -1170 ml      08/28/2023    5:00 AM 08/21/2023    5:23 PM 05/02/2023   10:49 AM  Last 3 Weights  Weight (lbs) 199 lb 15.3 oz 200 lb 210 lb 9.6 oz  Weight (kg) 90.7 kg 90.719 kg 95.528 kg      Telemetry/ECG    Sinus rhythm 70-80s , occasional PACs - Personally Reviewed  Physical Exam .   GEN: No acute distress.   Neck: No JVD Cardiac: RRR, no murmurs,  rubs, or gallops.  Respiratory: diminished at base bilaterally, on 2LNC, pox 93-94% with conversation  GI: Soft, nontender, non-distended  MS: trace BLE edema  Assessment & Plan .     Acute on chronic HFpEF - presented initially for increased SOB and acute hypoxic respiratory failure  - BNP 323 >408 , POA - CXR 1/9 showed increased interstitial consolidation consistent with interstitial edema. - Echo from 08/22/23 showed LVEF 50-55%, no RWMA/possible apical/apical septal hypokinesis, grade I DD, normal RV, trivial MRI, mod to severe MAC, IVC normal (LVEF was 60-65% on Echo 03/21/22) - On PTA Lasix  20mg  daily, now on IV Lasix  60mg  BID, Net -4.8L, weight 199.96 ib not tracking, BMP pending today/renal index overall stable,  clinically likely euvolemic, consider transition to PO lasix  40mg  daily going forward, please tract accurate weight and I&O daily if remain in house - GDMT: continue Toprol  XL 50mg  daily, on Farxiga  in the past and stopped due to cost and paresthesia of finger which remains despite stopping, she wants to re-try Farxiga  10mg  which has been started  - Follow up arranged on 09/12/23 today with cardiology   NSTEMI  - Hs trop 17> 155 - EKG initially revealing diffuse ST depression - Echo as above - LHC was initially  recommended but declined by the patient; given extensive medical conditions and advanced age,medical therapy is recommended and she is agreeable   - LDL 96, A1C 6.1% from 1/9 - will continue medical therapy: competed 48 hours heparin  gtt; continue PTA ASA 81mg  daily, increased PTA Toprol  XL to 50mg  daily, changed PTA pravastatin  10mg  daily to crestor  10mg  daily  Acute on chronic hypoxic and hypercarbic respiratory failure  COPD with exacerbation  HTN HLD  Hx of CVA - per IM   For questions or updates, please contact [facility] Please consult [email or web address] for contact info under     Signed, Xika Zhao, NP   History and all data above reviewed.   Patient examined.  I agree with the findings as above.  She is upset about the alarms on the monitor.  She is not complaining of chest pain or SOB. The patient exam reveals COR:RRR  ,  Lungs: Decreased breath sounds at the bases  ,  Abd: Positive bowel sounds, no rebound no guarding, Ext No edema  .  All available labs, radiology testing, previous records reviewed. Agree with documented assessment and plan.    Acute HFpEF.:  Meds as above.  Improved through the course of this hospitalization.  OK to discharge from our standpoint.  Medical management for ischemic heart disease as per yesterday's note.    Royston Cornea Chane Magner  11:16 AM  08/28/2023

## 2023-08-28 NOTE — Plan of Care (Signed)
  Problem: Education: Goal: Knowledge of General Education information will improve Description: Including pain rating scale, medication(s)/side effects and non-pharmacologic comfort measures Outcome: Progressing   Problem: Health Behavior/Discharge Planning: Goal: Ability to manage health-related needs will improve Outcome: Progressing   Problem: Clinical Measurements: Goal: Ability to maintain clinical measurements within normal limits will improve Outcome: Progressing Goal: Will remain free from infection Outcome: Progressing Goal: Respiratory complications will improve Outcome: Progressing Goal: Cardiovascular complication will be avoided Outcome: Progressing   Problem: Activity: Goal: Risk for activity intolerance will decrease Outcome: Progressing   Problem: Nutrition: Goal: Adequate nutrition will be maintained Outcome: Progressing   Problem: Coping: Goal: Level of anxiety will decrease Outcome: Progressing   Problem: Safety: Goal: Ability to remain free from injury will improve Outcome: Progressing

## 2023-08-28 NOTE — Progress Notes (Signed)
 Occupational Therapy Treatment Patient Details Name: [name] MRN: [id number] DOB: 21-Dec-1932 Today's Date: 08/28/2023   History of present illness 89yo female who presented to [facility] ED on 1/8 via EMS with progressive shortness of breath.  She apparently has not been noncompliant with her home O2, was being seen by her PCP with noted SpO2 72% on room air. 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 Pt Sp02 looking stable on 2L while ambulatory, pleth somewhat inaccurate at times though. Pt actually demonstrating need for increased assist with LB ADLs after further evaluation of functional skills. OT to continue to progress pt as able, dc plans updated to SNF (pt reports she had discussion with MD and not able to return living home without assist at this time).       If plan is discharge home, recommend the following:  A lot of help with bathing/dressing/bathroom   Equipment Recommendations  None recommended by OT    Recommendations for Other Services      Precautions / Restrictions Precautions Precautions: Fall;Other (comment) Precaution Comments: watch sats Restrictions Weight Bearing Restrictions Per Provider Order: No       Mobility Bed Mobility               General bed mobility comments: up in recliner on arrival    Transfers Overall transfer level: Needs assistance Equipment used: Rollator (4 wheels) Transfers: Sit to/from Stand Sit to Stand: Contact guard assist           General transfer comment: Pt using brake mechanics appropriately     Balance Overall balance assessment: Needs assistance Sitting-balance support: No upper extremity supported, Feet supported Sitting balance-Leahy Scale: Good Sitting balance - Comments: in recliner   Standing balance support: Bilateral upper  extremity supported, During functional activity, Reliant on assistive device for balance Standing balance-Leahy Scale: Poor                             ADL either performed or assessed with clinical judgement   ADL               Lower Body Bathing: Maximal assistance Lower Body Bathing Details (indicate cue type and reason): apply lotion to BLEs     Lower Body Dressing: Sitting/lateral leans;Maximal assistance Lower Body Dressing Details (indicate cue type and reason): doff/don socks             Functional mobility during ADLs: Contact guard assist;Rolling walker (2 wheels)      Extremity/Trunk Assessment              Vision       Perception     Praxis      Cognition Arousal: Alert Behavior During Therapy: Flat affect Overall Cognitive Status: Impaired/Different from baseline                           Safety/Judgement: Decreased awareness of safety, Decreased awareness of deficits              Exercises      Shoulder Instructions       General Comments VSS on 2L    Pertinent Vitals/ Pain       Pain Assessment Pain Assessment: No/denies pain  Home  Living                                          Prior Functioning/Environment              Frequency  Min 1X/week        Progress Toward Goals  OT Goals(current goals can now be found in the care plan section)  Progress towards OT goals: Progressing toward goals  Acute Rehab OT Goals OT Goal Formulation: With patient Time For Goal Achievement: 09/07/23 Potential to Achieve Goals: Good  Plan      Co-evaluation                 AM-PAC OT "6 Clicks" Daily Activity     Outcome Measure   Help from another person eating meals?: None Help from another person taking care of personal grooming?: A Little Help from another person toileting, which includes using toliet, bedpan, or urinal?: A Little Help from another person bathing  (including washing, rinsing, drying)?: A Little Help from another person to put on and taking off regular upper body clothing?: A Little Help from another person to put on and taking off regular lower body clothing?: A Lot 6 Click Score: 18    End of Session Equipment Utilized During Treatment: Gait belt;Rollator (4 wheels)  OT Visit Diagnosis: Unsteadiness on feet (R26.81);Other (comment) (SOB)   Activity Tolerance Patient tolerated treatment well   Patient Left in chair;with call bell/phone within reach;with chair alarm set   Nurse Communication Mobility status        Time: 3295-1884 OT Time Calculation (min): 33 min  Charges: OT General Charges $OT Visit: 1 Visit OT Treatments $Self Care/Home Management : 8-22 mins $Therapeutic Activity: 8-22 mins  08/28/2023  AB, OTR/L  Acute Rehabilitation Services  Office: [phone]   Jorene New 08/28/2023, 5:22 PM

## 2023-08-28 NOTE — TOC Progression Note (Addendum)
 Transition of Care [facility]) - Progression Note    Patient Details  Name: XIARA BRADT MRN: [id number] Date of Birth: Sep 05, 1932  [facility]) CM/SW Contact  Jannice Mends, LCSW Phone Number: 08/28/2023, 8:52 AM  Clinical Narrative:    8:52am-CSW received request from insurance with note of stability from MD. Per MD, patient requiring more lasix  today. CSW updated insurance to see if auth needs to be cancelled and re-started when off lasix ; awaiting response.   4:45 PM-Updated clinicals requested so CSW uploaded to the portal.   Expected Discharge Plan: Skilled Nursing Facility Barriers to Discharge: Continued Medical Work up, English as a second language teacher, SNF Pending bed offer  Expected Discharge Plan and Services In-house Referral: Clinical Social Work   Post Acute Care Choice: Skilled Nursing Facility Living arrangements for the past 2 months: Single Family Home                                       Social Determinants of Health (SDOH) Interventions SDOH Screenings   Food Insecurity: No Food Insecurity (08/23/2023)  Housing: Low Risk  (08/23/2023)  Transportation Needs: No Transportation Needs (08/23/2023)  Utilities: Not At Risk (08/23/2023)  Depression (PHQ2-9): Low Risk  (12/12/2020)  Social Connections: Unknown (08/23/2023)  Tobacco Use: Medium Risk (08/22/2023)    Readmission Risk Interventions     No data to display

## 2023-08-29 DIAGNOSIS — J9601 Acute respiratory failure with hypoxia: Secondary | ICD-10-CM | POA: Diagnosis not present

## 2023-08-29 LAB — BASIC METABOLIC PANEL
Anion gap: 10 (ref 5–15)
BUN: 36 mg/dL — ABNORMAL HIGH (ref 8–23)
CO2: 42 mmol/L — ABNORMAL HIGH (ref 22–32)
Calcium: 9.2 mg/dL (ref 8.9–10.3)
Chloride: 85 mmol/L — ABNORMAL LOW (ref 98–111)
Creatinine, Ser: 1.03 mg/dL — ABNORMAL HIGH (ref 0.44–1.00)
GFR, Estimated: 52 mL/min — ABNORMAL LOW (ref >60)
Glucose, Bld: 117 mg/dL — ABNORMAL HIGH (ref 70–99)
Potassium: 4.3 mmol/L (ref 3.5–5.1)
Sodium: 137 mmol/L (ref 135–145)

## 2023-08-29 LAB — PHOSPHORUS: Phosphorus: 5.4 mg/dL — ABNORMAL HIGH (ref 2.5–4.6)

## 2023-08-29 LAB — MAGNESIUM: Magnesium: 2.4 mg/dL (ref 1.7–2.4)

## 2023-08-29 MED ORDER — POTASSIUM CHLORIDE CRYS ER 20 MEQ PO TBCR
20.0000 meq | EXTENDED_RELEASE_TABLET | Freq: Once | ORAL | Status: AC
  Administered 2023-08-29: 20 meq via ORAL
  Filled 2023-08-29: qty 1

## 2023-08-29 MED ORDER — FUROSEMIDE 10 MG/ML IJ SOLN
40.0000 mg | Freq: Two times a day (BID) | INTRAMUSCULAR | Status: DC
  Administered 2023-08-29 – 2023-08-30 (×2): 40 mg via INTRAVENOUS
  Filled 2023-08-29 (×2): qty 4

## 2023-08-29 NOTE — Progress Notes (Signed)
   08/29/23 1029  Mobility  Activity  (STS)  Level of Assistance Contact guard assist, steadying assist  Assistive Device Front wheel walker  Activity Response Tolerated fair  Mobility Referral Yes  Mobility visit 1 Mobility  Mobility Specialist Start Time (ACUTE ONLY) 1005  Mobility Specialist Stop Time (ACUTE ONLY) 1029  Mobility Specialist Time Calculation (min) (ACUTE ONLY) 24 min   Mobility Specialist: Progress Note  Pre-Mobility: HR 76, SpO2 92% 2L Post-Mobility: HR 81, SpO2 95% 2L  Pt agreeable to mobility session - received in chair. Required CG using RW, STS x1. C/o needing to change linens in chair d/t covered in urine.  Returned to chair with all needs met - call bell within reach. Chair alarm on.   Barnie Mort, BS Mobility Specialist Please contact via SecureChat or Rehab office at [phone].

## 2023-08-29 NOTE — Plan of Care (Signed)

## 2023-08-29 NOTE — Progress Notes (Signed)
Physical Therapy Treatment Patient Details Name: [name] MRN: [id number] DOB: September 11, 1932 Today's Date: 08/29/2023   History of Present Illness 89yo female who presented to [facility] ED on 1/8 via EMS with progressive shortness of breath.  She apparently has not been noncompliant with her home O2, was being seen by her PCP with noted SpO2 72% on room air. PMHx significant for HTN, HLD, COPD, CHF, prior CVA without residual deficits (lacunar infarct 10/2020), thyroid cyst, OA    PT Comments  Pt received sitting in the recliner and agreeable to session. Pt able to tolerate increased gait distance this session with CGA for safety. Pt demonstrates flexed trunk with elbows rested on RW, which pt reports is baseline due to back pain. Pt limited by impaired activity tolerance and DOE. Pt continues to benefit from PT services to progress toward functional mobility goals.     If plan is discharge home, recommend the following: A little help with walking and/or transfers;A little help with bathing/dressing/bathroom;Help with stairs or ramp for entrance;Assistance with cooking/housework;Assist for transportation   Can travel by private Psychologist, clinical (4 wheels)    Recommendations for Other Services       Precautions / Restrictions Precautions Precautions: Fall;Other (comment) Precaution Comments: watch sats Restrictions Weight Bearing Restrictions Per Provider Order: No     Mobility  Bed Mobility               General bed mobility comments: up in recliner on arrival    Transfers Overall transfer level: Needs assistance Equipment used: Rolling walker (2 wheels) Transfers: Sit to/from Stand Sit to Stand: Contact guard assist                Ambulation/Gait Ambulation/Gait assistance: Contact guard assist Gait Distance (Feet): 60 Feet Assistive device: Rolling walker (2 wheels) Gait Pattern/deviations: Step-through pattern, Trunk  flexed Gait velocity: decreased     General Gait Details: Pt demonstrates slow step-through pattern with trunk flexed and elbows rested on RW despite cues. Increased difficulty and effort required to advance RW due to heavy support, so anticipate improvement with rollator      Balance Overall balance assessment: Needs assistance Sitting-balance support: No upper extremity supported, Feet supported Sitting balance-Leahy Scale: Good     Standing balance support: Bilateral upper extremity supported, During functional activity, Reliant on assistive device for balance Standing balance-Leahy Scale: Poor Standing balance comment: with RW support                            Cognition Arousal: Alert Behavior During Therapy: WFL for tasks assessed/performed Overall Cognitive Status: Impaired/Different from baseline                           Safety/Judgement: Decreased awareness of safety, Decreased awareness of deficits              Exercises      General Comments        Pertinent Vitals/Pain Pain Assessment Pain Assessment: No/denies pain     PT Goals (current goals can now be found in the care plan section) Acute Rehab PT Goals Patient Stated Goal: home PT Goal Formulation: With patient Time For Goal Achievement: 09/07/23 Progress towards PT goals: Progressing toward goals    Frequency    Min 1X/week       AM-PAC PT "6 Clicks" Mobility  Outcome Measure  Help needed turning from your back to your side while in a flat bed without using bedrails?: A Little Help needed moving from lying on your back to sitting on the side of a flat bed without using bedrails?: A Little Help needed moving to and from a bed to a chair (including a wheelchair)?: A Little Help needed standing up from a chair using your arms (e.g., wheelchair or bedside chair)?: A Little Help needed to walk in hospital room?: A Little Help needed climbing 3-5 steps with a  railing? : A Lot 6 Click Score: 17    End of Session Equipment Utilized During Treatment: Gait belt;Oxygen Activity Tolerance: Patient limited by fatigue;Patient tolerated treatment well Patient left: with call bell/phone within reach (In bathroom with instructions to pull cord when finished, RN aware) Nurse Communication: Mobility status;Other (comment) (Pt in bathroom) PT Visit Diagnosis: Difficulty in walking, not elsewhere classified (R26.2);Muscle weakness (generalized) (M62.81)     Time: 9563-8756 PT Time Calculation (min) (ACUTE ONLY): 18 min  Charges:    $Gait Training: 8-22 mins PT General Charges $$ ACUTE PT VISIT: 1 Visit                     Johny Shock, PTA Acute Rehabilitation Services Secure Chat Preferred  [phone]    Johny Shock 08/29/2023, 12:39 PM

## 2023-08-29 NOTE — Progress Notes (Signed)
Nutrition Follow-up  DOCUMENTATION CODES:   Morbid obesity  INTERVENTION:  Continue with current liberalized diet.   NUTRITION DIAGNOSIS:   Increased nutrient needs related to chronic illness as evidenced by estimated needs.  Ongoing addressed through interventions  GOAL:   Patient will meet greater than or equal to 90% of their needs    MONITOR:   PO intake  REASON FOR ASSESSMENT:   Consult Assessment of nutrition requirement/status  ASSESSMENT:   89 y.o. f presented to ED  with complaints of increasing shortness of breath.  Patient was refereed to ED from PCP office after being found to be SOB and hypoxic. Admitting with principal problem of acute respiratory failure with hypoxia. PMH: HTN, HLD, Shingles, COPD, Stroke, Morbid obesity, OAB, CHF, CAD.  Stated appetite good with no complaints.  90-100% Meds labs reviewed.  Suspect current nutr poc providing adequate nutrition.  Will continue with current nutr poc.    NUTRITION - FOCUSED PHYSICAL EXAM:  Flowsheet Row Most Recent Value  Orbital Region No depletion  Upper Arm Region No depletion  Thoracic and Lumbar Region No depletion  Buccal Region No depletion  Temple Region No depletion  Clavicle Bone Region No depletion  Clavicle and Acromion Bone Region No depletion  Scapular Bone Region No depletion  Dorsal Hand No depletion  Patellar Region No depletion  Anterior Thigh Region No depletion  Posterior Calf Region No depletion  Edema (RD Assessment) None  Hair Reviewed  Eyes Reviewed  Mouth Reviewed  Skin Reviewed  Nails Reviewed       Diet Order:   Diet Order             Diet regular Room service appropriate? Yes with Assist; Fluid consistency: Thin  Diet effective now                   EDUCATION NEEDS:   No education needs have been identified at this time  Skin:  Skin Assessment: Reviewed RN Assessment  Last BM:  1/15 type 4  Height:   Ht Readings from Last 1 Encounters:   08/21/23 4\' 11"  (1.499 m)    Weight:   Wt Readings from Last 1 Encounters:  08/29/23 89.5 kg    Ideal Body Weight:     BMI:  Body mass index is 39.85 kg/m.  Estimated Nutritional Needs:   Kcal:  1600-1818 kcal  Protein:  60-75 g  Fluid:  20ml/kcal    Jamelle Haring RDN, LDN Clinical Dietitian   If unable to reach, please contact "RD Inpatient" secure chat group between 8 am-4 pm daily"

## 2023-08-29 NOTE — Plan of Care (Signed)
  Problem: Education: Goal: Knowledge of General Education information will improve Description: Including pain rating scale, medication(s)/side effects and non-pharmacologic comfort measures Outcome: Progressing   Problem: Health Behavior/Discharge Planning: Goal: Ability to manage health-related needs will improve Outcome: Progressing   Problem: Clinical Measurements: Goal: Ability to maintain clinical measurements within normal limits will improve Outcome: Progressing Goal: Will remain free from infection Outcome: Progressing Goal: Diagnostic test results will improve Outcome: Progressing Goal: Respiratory complications will improve Outcome: Progressing Goal: Cardiovascular complication will be avoided Outcome: Progressing   Problem: Nutrition: Goal: Adequate nutrition will be maintained Outcome: Progressing   Problem: Coping: Goal: Level of anxiety will decrease Outcome: Progressing   Problem: Elimination: Goal: Will not experience complications related to bowel motility Outcome: Progressing   

## 2023-08-29 NOTE — Progress Notes (Signed)
Heart Failure Stewardship Pharmacist Progress Note   PCP: Noberto Retort, MD PCP-Cardiologist: Lesleigh Noe, MD (Inactive)    HPI:  89 yo F with PMH COPD on 2 L O2, HFpEF, hypertension, hyperlipidemia, stroke, carotid stenosis.  Patient presented to pulmonologist on 1/8 and became hypoxic during a coughing episode and she was referred to the ED. Patient presented to ED with worsening SOB and bilateral LEE and productive cough. Trop 17->155 with EKG showing diffuse ST depression with concern for NSTEMI. Cardiology consulted and she was found to have NSTEMI complicated by flash pulmonary edema. Due to her respiratory status and inability to lay flat, cath was deferred. Plan for aggressive diuresis and BiPAP then revisit cardiac cath, however patient preferred more conservative management and declined further treatment. Respiratory status has improved with diuresis. Likely multifactorial in the setting of HF with elevated BNP and COPD. ECHO from 1/9 showed EF 50-55%, G1DD, elevated LVEDP, possible, apical/apical septal hypokinesis, normal RV function, trivial MR, moderate to severe mitral annular calcification.   Reported that she had numbness and tingling when taking Farxiga in the past and these sx resolved when she stopped the medication. She previously stated not willing to try [place] or Jardiance in the future. However, cardiology discussed with her again on 1/14 and she is now willing to retry this. She reports she was still having some numbness and tingling in her fingers after stopping this medication. Later determined it was also cost that was a factor with stopping the medication.  Denies shortness of breath or chest pain. States she still has a cough and chest congestion. Trace LEE on exam. Is not feeling any side effects with the medication adjustments. No new questions or concerns.   Current HF Medications: Diuretic: furosemide 40 mg IV BID Beta Blocker: metoprolol succinate  50 mg daily SGLT2i: Farxiga 10 mg daily  Prior to admission HF Medications: Diuretic: furosemide 20 mg daily Beta blocker: metoprolol succinate 25 mg daily   Pertinent Lab Values: Serum creatinine 1.03, BUN 36, Potassium 4.3, Sodium 137, BNP 408.9, Magnesium 2.4, A1c 6.1%  Vital Signs: Weight: 197 lbs (admission weight: 200 lbs) Blood pressure: 92-143/60-70s  Heart rate: 60-70s  I/O: net -3.1 L yesterday; net -6.0 L since admission  Medication Assistance / Insurance Benefits Check: Does the patient have prescription insurance?  Yes Type of insurance plan: [employer]   Outpatient Pharmacy:  Prior to admission outpatient [facility] Is the patient willing to use [facility] TOC pharmacy at discharge? Yes, if not being discharged to SNF Is the patient willing to transition their outpatient pharmacy to utilize a [facility] outpatient pharmacy?   No    Assessment: 1. Acute on chronic HFpEF (LVEF 50-55%), likely due to ICM. NYHA class II symptoms. - Continue metoprolol XL 50 mg daily - Continue furosemide 40 mg IV BID. Output improved. May be close to transitioning to PO lasix. Ensure continued daily weights. Strict I/O. Keep K>4 and Mg>2. KCl 20 mEq x 1 given. - Avoid spironolactone due to hx of high normal potassium - Continue Farxiga 10 mg daily today. She seems to be eligible for a [employer] grant but she is being discharged to SNF. Recommend applying for grant once home since this needs to be used by a certain time from enrollment to stay active with the grant.    Plan: 1) Medication changes recommended at this time: - May be close to transitioning to PO lasix    2) Patient assistance: -  Jardiance/Farxiga/Entresto is $90 per 30 day supply - She is eligible for [employer] to cover [place] cost. Since she is being discharged to SNF, recommend applying for grant at follow up once discharged from SNF. The grant needs to be used within a certain period of time from  enrollment to stay active and she is unsure how long she will need to be at [facility].    3)  Education  - Patient has been educated on current HF medications and potential additions to HF medication regimen - Patient verbalizes understanding that over the next few months, these medication doses may change and more medications may be added to optimize HF regimen - Patient has been educated on basic disease state pathophysiology and goals of therapy   Jarrett Ables, PharmD PGY-1 Pharmacy Resident  Sharen Hones, PharmD, BCPS Heart Failure Stewardship Pharmacist Phone [phone]

## 2023-08-29 NOTE — Progress Notes (Addendum)
PROGRESS NOTE        PATIENT DETAILS Name: [name] Age: 89 y.o. Sex: female Date of Birth: October 17, 1932 Admit Date: 08/21/2023 Admitting Physician Eduard Clos, MD ZOX:[id number], Tawni Pummel, MD  Brief Summary: Patient is a 89 y.o.  female with history of COPD on 2 L of oxygen at home, chronic HFpEF, HTN, HLD, CVA-who presented with shortness of breath-thought to be due to flash pulm edema-post admission patient became somnolent and was found to have hypercarbia.  She was also found to have possible non-STEMI.  She was placed on BiPAP-IV heparin-IV Lasix -bronchodilators-and has clinically improved.  See below for further details.  Significant events: 1/8>> admit to [facility]-short of breath-hypoxic-likely from flash pulm edema 1/9 >> somnolent-hypercarbia on ABG-BiPAP  1/10>> back on 2 L of oxygen  Significant studies: 1/8>> CXR: Central vascular congestion 1/9>> CXR: Increased interstitial edema 1/9>> bilateral lower extremity Doppler: No DVT 1/9>> echo: EF 50-55%, grade 1 diastolic dysfunction.  Significant microbiology data: 1/8>> COVID/influenza/RSV PCR: Negative 1/9>> respiratory virus panel: Negative  Procedures: None  Consults: Cardiology Pulmonology  Subjective: Patient in recliner this morning, appears comfortable, denies any headache, no fever, no chest pain or pressure, difficultly improved shortness of breath , no abdominal pain. No focal weakness.  Objective: Vitals: Blood pressure 105/63, pulse 74, temperature 98.5 F (36.9 C), temperature source Oral, resp. rate 16, height 4\' 11"  (1.499 m), weight 89.5 kg, SpO2 91%.   Exam:  Awake Alert, at times minimally confused, no new F.N deficits, Normal affect [place].AT,PERRAL Supple Neck, No JVD,   Symmetrical Chest wall movement, Good air movement bilaterally, has bibasilar crackles with trace leg edema RRR,No Gallops, Rubs or new Murmurs,  +ve B.Sounds, Abd Soft, No tenderness,      Assessment/Plan:  Acute metabolic encephalopathy Secondary to hypercarbia/hypoxia Markedly better after treatment of underlying etiologies-completely awake and alert.  Acute on chronic hypoxic and hypercarbic respiratory failure Felt to be primarily due to flash pulm edema-COPD exacerbation if any was mild.  Initially required BiPAP at night now on 2 to 3 L nasal cannula oxygen, being diuresed with IV Lasix, cardiology following, initially improved however night of 08/26/2023 fluid overload worsened, Lasix dose adjusted upwards on 08/27/2023, overall much improved titrate down Lasix on 08/29/2023 and monitor.  Received 1 dose of Zaroxolyn on 08/28/2023 with much improvement.  Flash pulm edema Acute on chronic HFpEF EF 60% Significant improvement in volume status  Cardiology on board continue IV Lasix and beta-blocker Follow weights/electrolytes  Non-STEMI No chest pain-but potentially causing above She does not want to pursue LHC-is okay with medical management IV heparin discontinued after 48 hours on 1/11 On aspirin/statin/beta-blocker  HTN BP stable Beta-blocker Amlodipine remains on hold  COPD exacerbation Has resolved Lungs are completely clear on exam Quick prednisone taper Continue bronchodilators.  Prior history of CVA No residual deficits  aspirin/statin  Morbid Obesity: Estimated body mass index is 39.85 kg/m as calculated from the following:   Height as of this encounter: 4\' 11"  (1.499 m).   Weight as of this encounter: 89.5 kg.   Code status:   Code Status: Limited: Do not attempt resuscitation (DNR) -DNR-LIMITED -Do Not Intubate/DNI    DVT Prophylaxis:IV heparin    Family Communication: Arnold Long  [phone] left VM on 08/26/2023, 08/29/23   Disposition Plan: Status is: Inpatient Remains inpatient appropriate because: Severity of  illness   Planned Discharge Destination:Home health   Diet: Diet Order             Diet regular  Room service appropriate? Yes with Assist; Fluid consistency: Thin  Diet effective now                    MEDICATIONS: Scheduled Meds:  arformoterol  15 mcg Nebulization BID   aspirin EC  81 mg Oral Daily   budesonide  0.25 mg Nebulization BID   cholecalciferol  1,000 Units Oral Daily   dapagliflozin propanediol  10 mg Oral Daily   docusate sodium  100 mg Oral Daily   furosemide  40 mg Intravenous BID   heparin  5,000 Units Subcutaneous Q8H   metoprolol succinate  50 mg Oral Daily   multivitamin with minerals  1 tablet Oral Daily   potassium chloride  20 mEq Oral Once   revefenacin  175 mcg Nebulization Daily   rosuvastatin  10 mg Oral Daily   Continuous Infusions:   PRN Meds:.ipratropium-albuterol   I have personally reviewed following labs and imaging studies  LABORATORY DATA:  Recent Labs  Lab 08/23/23 0624 08/24/23 0559 08/25/23 0614  WBC 13.4* 9.6 8.0  HGB 13.1 13.4 13.4  HCT 42.1 42.7 43.8  PLT 156 142* 118*  MCV 98.1 95.5 99.1  MCH 30.5 30.0 30.3  MCHC 31.1 31.4 30.6  RDW 14.7 15.3 15.0  LYMPHSABS  --   --  2.0  MONOABS  --   --  0.8  EOSABS  --   --  0.1  BASOSABS  --   --  0.0    Recent Labs  Lab 08/22/23 1603 08/22/23 1845 08/23/23 0624 08/24/23 0740 08/25/23 0614 08/27/23 0434 08/28/23 0747 08/28/23 0826 08/29/23 0552  NA 139 140 139 141 141 139  --  140 137  K 5.4* 5.0 4.6 3.5 4.1 3.9  --  3.2* 4.3  CL 93* 91* 89* 88* 90* 87*  --  84* 85*  CO2 31 41* 41* 43* 39* 42*  --  44* 42*  ANIONGAP 15 8 9 10 12 10   --  12 10  GLUCOSE 105* 191* 108* 105* 107* 107*  --  109* 117*  BUN 22 21 22  26* 26* 31*  --  30* 36*  CREATININE 0.93 1.15* 0.92 1.06* 1.06* 0.92  --  0.97 1.03*  AST 108*  --   --   --   --   --   --   --   --   ALT 22  --   --   --   --   --   --   --   --   ALKPHOS 52  --   --   --   --   --   --   --   --   BILITOT 1.1  --   --   --   --   --   --   --   --   ALBUMIN 3.5  --   --   --   --   --   --   --   --    PROCALCITON  --  <0.10  --   --   --   --   --   --   --   BNP  --   --   --   --  408.9*  --   --   --   --  MG  --   --  2.2  --  2.3 2.2 2.3  --  2.4  PHOS  --   --  2.9  --  5.0* 4.6  --   --  5.4*  CALCIUM 8.8* 9.0 9.2 8.8* 8.9 8.8*  --  9.2 9.2    Lab Results  Component Value Date   CHOL 161 08/22/2023   HDL 56 08/22/2023   LDLCALC 96 08/22/2023   TRIG 46 08/22/2023   CHOLHDL 2.9 08/22/2023      Recent Labs  Lab 08/22/23 1845 08/23/23 0624 08/24/23 0740 08/25/23 6045 08/27/23 0434 08/28/23 0747 08/28/23 0826 08/29/23 0552  PROCALCITON <0.10  --   --   --   --   --   --   --   BNP  --   --   --  408.9*  --   --   --   --   MG  --  2.2  --  2.3 2.2 2.3  --  2.4  CALCIUM 9.0 9.2 8.8* 8.9 8.8*  --  9.2 9.2    RADIOLOGY STUDIES/RESULTS: DG Chest Port 1 View Result Date: 08/28/2023 CLINICAL DATA:  Shortness of breath, low oxygen EXAM: PORTABLE CHEST 1 VIEW COMPARISON:  08/22/2023 FINDINGS: Normal heart size and stable mediastinal contours. Interstitial reticulation which is chronic when compared to prior with band of mild scarring at the left lung base. Aeration is improved at the bases. No pneumothorax. IMPRESSION: Improved aeration when compared to 08/22/2023. No new or acute abnormality. Electronically Signed   By: Tiburcio Pea M.D.   On: 08/28/2023 08:35      LOS: 7 days   Signature  -    Susa Raring M.D on 08/29/2023 at 9:48 AM   -  To page go to [email or web address]

## 2023-08-29 NOTE — TOC Progression Note (Signed)
Transition of Care [facility]) - Progression Note    Patient Details  Name: [name] MRN: [id number] Date of Birth: Jun 08, 1933  Transition of Care [facility]) CM/SW Contact  Mearl Latin, LCSW Phone Number: 08/29/2023, 3:53 PM  Clinical Narrative:    Insurance authorization still pending for [facility]. CSW uploaded additional therapy notes.    Expected Discharge Plan: Skilled Nursing Facility Barriers to Discharge: Continued Medical Work up, English as a second language teacher, SNF Pending bed offer  Expected Discharge Plan and Services In-house Referral: Clinical Social Work   Post Acute Care Choice: Skilled Nursing Facility Living arrangements for the past 2 months: Single Family Home                                       Social Determinants of Health (SDOH) Interventions SDOH Screenings   Food Insecurity: No Food Insecurity (08/23/2023)  Housing: Low Risk  (08/23/2023)  Transportation Needs: No Transportation Needs (08/23/2023)  Utilities: Not At Risk (08/23/2023)  Depression (PHQ2-9): Low Risk  (12/12/2020)  Social Connections: Unknown (08/23/2023)  Tobacco Use: Medium Risk (08/22/2023)    Readmission Risk Interventions     No data to display

## 2023-08-30 ENCOUNTER — Other Ambulatory Visit (HOSPITAL_COMMUNITY): Payer: Self-pay

## 2023-08-30 DIAGNOSIS — J9601 Acute respiratory failure with hypoxia: Secondary | ICD-10-CM | POA: Diagnosis not present

## 2023-08-30 MED ORDER — ROSUVASTATIN CALCIUM 10 MG PO TABS: 10.0000 mg | ORAL_TABLET | Freq: Every day | ORAL | Status: DC

## 2023-08-30 MED ORDER — METOPROLOL SUCCINATE ER 50 MG PO TB24
50.0000 mg | ORAL_TABLET | Freq: Every day | ORAL | 1 refills | Status: AC
  Filled 2023-08-30: qty 30, 30d supply, fill #0

## 2023-08-30 MED ORDER — METOPROLOL SUCCINATE ER 50 MG PO TB24: 50.0000 mg | ORAL_TABLET | Freq: Every day | ORAL | Status: DC

## 2023-08-30 MED ORDER — DAPAGLIFLOZIN PROPANEDIOL 10 MG PO TABS
10.0000 mg | ORAL_TABLET | Freq: Every day | ORAL | 1 refills | Status: AC
  Filled 2023-08-30: qty 30, 30d supply, fill #0

## 2023-08-30 MED ORDER — ROSUVASTATIN CALCIUM 10 MG PO TABS
10.0000 mg | ORAL_TABLET | Freq: Every day | ORAL | 1 refills | Status: DC
  Filled 2023-08-30: qty 30, 30d supply, fill #0

## 2023-08-30 MED ORDER — FUROSEMIDE 40 MG PO TABS: 80.0000 mg | ORAL_TABLET | Freq: Two times a day (BID) | ORAL | Status: DC

## 2023-08-30 MED ORDER — DAPAGLIFLOZIN PROPANEDIOL 10 MG PO TABS: 10.0000 mg | ORAL_TABLET | Freq: Every day | ORAL | Status: DC

## 2023-08-30 MED ORDER — FUROSEMIDE 40 MG PO TABS
40.0000 mg | ORAL_TABLET | Freq: Two times a day (BID) | ORAL | 1 refills | Status: DC
  Filled 2023-08-30: qty 60, 30d supply, fill #0

## 2023-08-30 MED ORDER — FUROSEMIDE 40 MG PO TABS: 40.0000 mg | ORAL_TABLET | Freq: Two times a day (BID) | ORAL | Status: DC

## 2023-08-30 NOTE — Progress Notes (Signed)
Heart Failure Stewardship Pharmacist Progress Note   PCP: Noberto Retort, MD PCP-Cardiologist: Lesleigh Noe, MD (Inactive)    HPI:  89 yo F with PMH COPD on 2 L O2, HFpEF, hypertension, hyperlipidemia, stroke, carotid stenosis.  Patient presented to pulmonologist on 1/8 and became hypoxic during a coughing episode and she was referred to the ED. Patient presented to ED with worsening SOB and bilateral LEE and productive cough. Trop 17->155 with EKG showing diffuse ST depression with concern for NSTEMI. Cardiology consulted and she was found to have NSTEMI complicated by flash pulmonary edema. Due to her respiratory status and inability to lay flat, cath was deferred. Plan for aggressive diuresis and BiPAP then revisit cardiac cath, however patient preferred more conservative management and declined further treatment. Respiratory status has improved with diuresis. Likely multifactorial in the setting of HF with elevated BNP and COPD. ECHO from 1/9 showed EF 50-55%, G1DD, elevated LVEDP, possible, apical/apical septal hypokinesis, normal RV function, trivial MR, moderate to severe mitral annular calcification.   Reported that she had numbness and tingling when taking Farxiga in the past and these sx resolved when she stopped the medication. She previously stated not willing to try [place] or Jardiance in the future. However, cardiology discussed with her again on 1/14 and she is now willing to retry this. She reports she was still having some numbness and tingling in her fingers after stopping this medication. Later determined it was also cost that was a factor with stopping the medication.  Denies shortness of breath, LE edema, or chest pain. States she is feeling well. No new questions or concerns.   Discharge HF Medications: Diuretic: furosemide 40 mg BID Beta Blocker: metoprolol succinate 50 mg daily SGLT2i: Farxiga 10 mg daily  Prior to admission HF Medications: Diuretic:  furosemide 20 mg daily Beta blocker: metoprolol succinate 25 mg daily   Pertinent Lab Values: As of 1/16: Serum creatinine 1.03, BUN 36, Potassium 4.3, Sodium 137, BNP 408.9, Magnesium 2.4, A1c 6.1%  Vital Signs: Weight: 198 lbs (admission weight: 200 lbs) Blood pressure: 100-140/60-70s  Heart rate: 70-80s  I/O: net -1.5 L yesterday; net -9.4 L since admission  Medication Assistance / Insurance Benefits Check: Does the patient have prescription insurance?  Yes Type of insurance plan: [employer]   Outpatient Pharmacy:  Prior to admission outpatient [facility] Is the patient willing to use [facility] TOC pharmacy at discharge? Yes, if not being discharged to SNF Is the patient willing to transition their outpatient pharmacy to utilize a [facility] outpatient pharmacy?   No    Assessment: 1. Acute on chronic HFpEF (LVEF 50-55%), likely due to ICM. NYHA class II symptoms. - Continue metoprolol XL 50 mg daily - Continue furosemide 40 mg BID at discharge given addition of Farxiga to assist with fluid control. Strict I/O. Keep K>4 and Mg>2. - Avoid spironolactone due to hx of high normal potassium - Continue Farxiga 10 mg daily today. She seems to be eligible for a [employer] grant but she is being discharged to SNF. Recommend applying for grant once home since this needs to be used by a certain time from enrollment to stay active with the grant.    Plan: 1) Medication changes recommended at this time: - Agree with changes; discharge today  2) Patient assistance: - Jardiance/Farxiga/Entresto is $90 per 30 day supply - She is eligible for [employer] [facility] [place] to cover [place] cost. Since she is being discharged to SNF, recommend applying for grant  at follow up once discharged from SNF. The grant needs to be used within a certain period of time from enrollment to stay active and she is unsure how long she will need to be at [facility]   3)  Education  - Patient has been educated on  current HF medications and potential additions to HF medication regimen - Patient verbalizes understanding that over the next few months, these medication doses may change and more medications may be added to optimize HF regimen - Patient has been educated on basic disease state pathophysiology and goals of therapy   Sharen Hones, PharmD, BCPS Heart Failure Stewardship Pharmacist Phone [phone]

## 2023-08-30 NOTE — TOC Progression Note (Addendum)
Transition of Care [facility]) - Progression Note    Patient Details  Name: [name] MRN: [id number] Date of Birth: 1932-12-05  Transition [facility]) CM/SW Contact  Mearl Latin, LCSW Phone Number: 08/30/2023, 8:36 AM  Clinical Narrative:    8:36 AM-CSW received call from insurance stating they are denying SNF rehab and offered a Peer to Peer [id number] option 5). Therapy recommending home health so will discuss with MD.   9:44 AM-CSW met with patient (and her sister on speakerphone). CSW explained insurance denial for SNF and recommendation for home health and discussion with MD. RNCM assisting with setting up home health services. Patient stated she has home oxygen and a walker. She declined need for a 3in1. She and her sister are going to call friends to see who can bring her a coat and pick her up. CSW discussed Medicaid as a long term payer but she stated she makes too much money for Medicaid likely. CSW provided list of private duty nursing agencies.    Expected Discharge Plan: Skilled Nursing Facility Barriers to Discharge: Continued Medical Work up, English as a second language teacher, SNF Pending bed offer  Expected Discharge Plan and Services In-house Referral: Clinical Social Work   Post Acute Care Choice: Skilled Nursing Facility Living arrangements for the past 2 months: Single Family Home Expected Discharge Date: 08/30/23                                     Social Determinants of Health (SDOH) Interventions SDOH Screenings   Food Insecurity: No Food Insecurity (08/23/2023)  Housing: Low Risk  (08/23/2023)  Transportation Needs: No Transportation Needs (08/23/2023)  Utilities: Not At Risk (08/23/2023)  Depression (PHQ2-9): Low Risk  (12/12/2020)  Social Connections: Unknown (08/23/2023)  Tobacco Use: Medium Risk (08/22/2023)    Readmission Risk Interventions     No data to display

## 2023-08-30 NOTE — Progress Notes (Addendum)
   Patient seen briefly before being discharged.  She reports that she is feeling well.  Denies ankle swelling, shortness of breath, chest pain.  On exam, she has normal work of breathing on nasal cannula.  Has fine crackles in bilateral lung bases, but lung exam is otherwise clear to auscultation.  No lower extremity edema noted.  Heart rate and rhythm were normal, no murmurs noted.  Agree with discharging patient on aspirin 81 mg daily, Farxiga 10 mg daily, metoprolol supinate 50 mg daily, Crestor 10 mg daily.  With start of Farxiga, recommend discharging on p.o. Lasix 40 mg twice daily instead of 80 mg BID. Adjusted DC orders and updated primary team.   Patient has follow up with gen cards on 1/30   Jonita Albee, PA-C 08/30/2023 10:00 AM

## 2023-08-30 NOTE — TOC Progression Note (Signed)
Transition of Care [facility]) - Progression Note    Patient Details  Name: [name] MRN: [id number] Date of Birth: 01/19/1933  [facility]) CM/SW Contact  Gordy Clement, RN Phone Number: 08/30/2023, 10:31 AM  Clinical Narrative:     Patient was denied SNF so will now DC to home. She lives alone and does have limited support. Her Sister is available. [facility] will provide Home Health PT and OT. Patient has home O2 and a rolling walker. No additional DME recommended.  Portable O2 to be delivered to room    Expected Discharge Plan: Skilled Nursing Facility Barriers to Discharge: Continued Medical Work up, English as a second language teacher, SNF Pending bed offer  Expected Discharge Plan and Services In-house Referral: Clinical Social Work   Post Acute Care Choice: Skilled Nursing Facility Living arrangements for the past 2 months: Single Family Home Expected Discharge Date: 08/30/23                                     Social Determinants of Health (SDOH) Interventions SDOH Screenings   Food Insecurity: No Food Insecurity (08/23/2023)  Housing: Low Risk  (08/23/2023)  Transportation Needs: No Transportation Needs (08/23/2023)  Utilities: Not At Risk (08/23/2023)  Depression (PHQ2-9): Low Risk  (12/12/2020)  Social Connections: Unknown (08/23/2023)  Tobacco Use: Medium Risk (08/22/2023)    Readmission Risk Interventions     No data to display

## 2023-08-30 NOTE — Discharge Summary (Addendum)
[name] NAT:[id number] DOB: 03-14-1933 DOA: 08/21/2023  PCP: Noberto Retort, MD  Admit date: 08/21/2023  Discharge date: 08/30/2023  Admitted From: Home   Disposition:  SNF   Recommendations for Outpatient Follow-up:   Follow up with PCP in 1-2 weeks  PCP Please obtain BMP/CBC, 2 view CXR in 1week,  (see Discharge instructions)   PCP Please follow up on the following pending results: Monitor CBC, CMP, magnesium closely, recheck in 3 to 5 days.  Adjust diuretic dose as needed.   Home Health: None   Equipment/Devices: None  Consultations: Cards, Pulm Discharge Condition: Stable    CODE STATUS: Full    Diet Recommendation: Heart Healthy, strict 1.5 L fluid restriction per day  Diet Order             Diet regular Room service appropriate? Yes with Assist; Fluid consistency: Thin  Diet effective now                    Chief Complaint  Patient presents with   Shortness of Breath     Brief history of present illness from the day of admission and additional interim summary    89 y.o.  female with history of COPD on 2 L of oxygen at home, chronic HFpEF, HTN, HLD, CVA-who presented with shortness of breath-thought to be due to flash pulm edema-post admission patient became somnolent and was found to have hypercarbia.  She was also found to have possible non-STEMI.  She was placed on BiPAP-IV heparin-IV Lasix -bronchodilators-and has clinically improved.  See below for further details.   Significant events: 1/8>> admit to [facility]-short of breath-hypoxic-likely from flash pulm edema 1/9 >> somnolent-hypercarbia on ABG-BiPAP  1/10>> back on 2 L of oxygen   Significant studies: 1/8>> CXR: Central vascular congestion 1/9>> CXR: Increased interstitial edema 1/9>> bilateral lower extremity Doppler: No  DVT 1/9>> echo: EF 50-55%, grade 1 diastolic dysfunction.   Significant microbiology data: 1/8>> COVID/influenza/RSV PCR: Negative 1/9>> respiratory virus panel: Negative                                                                 Hospital Course   Acute metabolic encephalopathy Secondary to hypercarbia/hypoxia, completely resolved   Acute on chronic hypoxic and hypercarbic respiratory failure Felt to be primarily due to flash pulm edema-COPD exacerbation if any was mild.  Initially required BiPAP at night now on 2 nasal cannula oxygen 24/7, stable on it and symptom-free, has been adequately diuresed, symptom-free now will be discharged to SNF as per plan below.   Flash pulm edema Acute on chronic HFpEF EF 60% Significant improvement in volume status  By cardiologist, aggressively diuresed with IV Lasix, now near decompensated, transition to oral Lasix and Farxiga along with beta-blocker, continue 2 L nasal cannula  oxygen which she wears 24/7.  Discharge to SNF with strict 1.5 L fluid restriction, monitor CMP, magnesium and diuretic dose closely.  Monitor fluid status closely.  Post discharge follow-up with cardiologist in 1 to 2 weeks.   Non-STEMI No chest pain-but potentially causing above She does not want to pursue [facility]-is okay with medical management IV heparin discontinued after 48 hours on 1/11 On aspirin/statin/beta-blocker, postdischarge outpatient cardiology follow-up, seen by cardiology here   HTN BP stable present dose beta-blocker continue along with diuretics   COPD exacerbation Has resolved Continue bronchodilators.   Prior history of CVA No residual deficits  aspirin/statin   Morbid Obesity: Estimated body mass index is 39.85 kg/m , with PCP for weight loss.  Discharge diagnosis     Principal Problem:   Acute respiratory failure with hypoxia (HCC) Active Problems:   Essential hypertension   Tremors of nervous system   (HFpEF) heart failure with  preserved ejection fraction (HCC)   Chronic respiratory failure with hypoxia (HCC)   COPD with acute exacerbation (HCC)   Abnormal EKG   NSTEMI (non-ST elevated myocardial infarction) (HCC)   Macrocytosis   Lower extremity edema   Acute on chronic respiratory failure with hypoxia and hypercapnia (HCC)   Acute pulmonary edema (HCC)   Acute on chronic heart failure with preserved ejection fraction (HFpEF) (HCC)   Acute on chronic congestive heart failure [facility])    Discharge instructions    Discharge Instructions     Discharge instructions   Complete by: As directed    Follow with Primary MD Noberto Retort, MD in 7 days   Get CBC, CMP, Magnesium, 2 view Chest X ray -  checked next visit with your primary MD or SNF MD   Activity: As tolerated with Full fall precautions use walker/cane & assistance as needed  Disposition SNF  Diet: Heart Healthy with feeding assistance and aspiration precautions, strict 1.5 L fluid restriction per day  Check your Weight same time everyday, if you gain over 2 pounds, or you develop in leg swelling, experience more shortness of breath or chest pain, call your Primary MD immediately. Follow Cardiac Low Salt Diet and 1.5 lit/day fluid restriction.  Special Instructions: If you have smoked or chewed Tobacco  in the last 2 yrs please stop smoking, stop any regular Alcohol  and or any Recreational drug use.  On your next visit with your primary care physician please Get Medicines reviewed and adjusted.  Please request your Prim.MD to go over all Hospital Tests and Procedure/Radiological results at the follow up, please get all Hospital records sent to your Prim MD by signing hospital release before you go home.  If you experience worsening of your admission symptoms, develop shortness of breath, life threatening emergency, suicidal or homicidal thoughts you must seek medical attention immediately by calling 911 or calling your MD immediately  if symptoms  less severe.  You Must read complete instructions/literature along with all the possible adverse reactions/side effects for all the Medicines you take and that have been prescribed to you. Take any new Medicines after you have completely understood and accpet all the possible adverse reactions/side effects.   Do not drive when taking Pain medications.  Do not take more than prescribed Pain, Sleep and Anxiety Medications   Increase activity slowly   Complete by: As directed        Discharge Medications   Allergies as of 08/30/2023       Reactions   Atorvastatin Other (See  Comments)   Myalgias   Ceftin [cefuroxime] Diarrhea   Cefuroxime Axetil Diarrhea   Clindamycin Hcl Itching   Clindamycin/lincomycin Itching   Dapagliflozin    Other Reaction(s): numbness/tingling   Moxifloxacin Itching   Simvastatin Other (See Comments)   myalgias   Doxycycline Hyclate Diarrhea, Rash   Penicillins Rash        Medication List     STOP taking these medications    amLODipine 2.5 MG tablet Commonly known as: NORVASC   Metoprolol Succinate 25 MG Cs24 Replaced by: metoprolol succinate 50 MG 24 hr tablet   pravastatin 10 MG tablet Commonly known as: PRAVACHOL       TAKE these medications    albuterol (2.5 MG/3ML) 0.083% nebulizer solution Commonly known as: PROVENTIL Take 3 mLs (2.5 mg total) by nebulization every 4 (four) hours as needed for wheezing or shortness of breath.   aspirin EC 81 MG tablet Take 1 tablet (81 mg total) by mouth daily. Swallow whole.   budesonide 0.25 MG/2ML nebulizer solution Commonly known as: Pulmicort Take 2 mLs (0.25 mg total) by nebulization in the morning and at bedtime.   dapagliflozin propanediol 10 MG Tabs tablet Commonly known as: FARXIGA Take 1 tablet (10 mg total) by mouth daily.   docusate sodium 100 MG capsule Commonly known as: COLACE Take 100 mg by mouth daily.   furosemide 40 MG tablet Commonly known as: Lasix Take 1 tablet  (40 mg total) by mouth 2 (two) times daily. What changed:  medication strength how much to take when to take this   ipratropium-albuterol 0.5-2.5 (3) MG/3ML Soln Commonly known as: DUONEB Take 3 mLs by nebulization 2 (two) times daily.   metoprolol succinate 50 MG 24 hr tablet Commonly known as: TOPROL-XL Take 1 tablet (50 mg total) by mouth daily. Take with or immediately following a meal. Replaces: Metoprolol Succinate 25 MG Cs24   OXYGEN Inhale into the lungs as directed. 2L   REFRESH DRY EYE THERAPY OP Apply 2 drops to eye as needed (dry eyes).   rosuvastatin 10 MG tablet Commonly known as: CRESTOR Take 1 tablet (10 mg total) by mouth daily.   VITAMIN D PO Take 1,000 Units by mouth daily.         Contact information for follow-up providers     Noberto Retort, MD. Schedule an appointment as soon as possible for a visit in 1 week(s).   Specialty: Family Medicine Contact information: [phone] [address] [place] [address] [phone]         Jake Bathe, MD. Schedule an appointment as soon as possible for a visit in 1 week(s).   Specialty: Cardiology Contact information: [address] [place] [address] [phone]              Contact information for after-discharge care     Destination     [facility] Preferred SNF .   Service: Skilled Nursing Contact information: [address] [place] [address] [phone]                     Major procedures and Radiology Reports - PLEASE review detailed and final reports thoroughly  -     DG Chest [facility] 1 View Result Date: 08/28/2023 CLINICAL DATA:  Shortness of breath, low oxygen EXAM: PORTABLE CHEST 1 VIEW COMPARISON:  08/22/2023 FINDINGS: Normal heart size and stable mediastinal contours. Interstitial reticulation which is chronic when compared to prior with  band of mild scarring at the left lung base.  Aeration is improved at the bases. No pneumothorax. IMPRESSION: Improved aeration when compared to 08/22/2023. No new or acute abnormality. Electronically Signed   By: Tiburcio Pea M.D.   On: 08/28/2023 08:35   VAS [place] LOWER EXTREMITY VENOUS (DVT) (ONLY [facility] & [facility]) Result Date: 08/22/2023  Lower Venous DVT Study Patient Name:  [name]  Date of Exam:   08/22/2023 Medical Rec #: [id number]      Accession #:    [id number] Date of Birth: Oct 18, 1932      Patient Gender: F Patient Age:   59 years Exam Location:  [facility] Procedure:      VAS [place] LOWER EXTREMITY VENOUS (DVT) Referring Phys: Midge Minium --------------------------------------------------------------------------------  Indications: Swelling, and Edema.  Risk Factors: Obesity and past pregnancy. Limitations: Body habitus. Comparison Study: No changes seen since previous exam 10/21/20. Performing Technologist: Shona Simpson  Examination Guidelines: A complete evaluation includes B-mode imaging, spectral Doppler, color Doppler, and power Doppler as needed of all accessible portions of each vessel. Bilateral testing is considered an integral part of a complete examination. Limited examinations for reoccurring indications may be performed as noted. The reflux portion of the exam is performed with the patient in reverse Trendelenburg.  +---------+---------------+---------+-----------+----------+--------------+ RIGHT    CompressibilityPhasicitySpontaneityPropertiesThrombus Aging +---------+---------------+---------+-----------+----------+--------------+ CFV      Full           Yes      Yes                                 +---------+---------------+---------+-----------+----------+--------------+ SFJ      Full                                                        +---------+---------------+---------+-----------+----------+--------------+ FV Prox  Full                                                         +---------+---------------+---------+-----------+----------+--------------+ FV Mid   Full                                                        +---------+---------------+---------+-----------+----------+--------------+ FV DistalFull                                                        +---------+---------------+---------+-----------+----------+--------------+ PFV      Full                                                        +---------+---------------+---------+-----------+----------+--------------+ POP      Full  Yes      Yes                                 +---------+---------------+---------+-----------+----------+--------------+ PTV      Full                                                        +---------+---------------+---------+-----------+----------+--------------+ PERO     Full                                                        +---------+---------------+---------+-----------+----------+--------------+   +---------+---------------+---------+-----------+----------+--------------+ LEFT     CompressibilityPhasicitySpontaneityPropertiesThrombus Aging +---------+---------------+---------+-----------+----------+--------------+ CFV      Full           Yes      Yes                                 +---------+---------------+---------+-----------+----------+--------------+ SFJ      Full                                                        +---------+---------------+---------+-----------+----------+--------------+ FV Prox  Full                                                        +---------+---------------+---------+-----------+----------+--------------+ FV Mid   Full                                                        +---------+---------------+---------+-----------+----------+--------------+ FV DistalFull                                                         +---------+---------------+---------+-----------+----------+--------------+ PFV      Full                                                        +---------+---------------+---------+-----------+----------+--------------+ POP      Full           Yes      Yes                                 +---------+---------------+---------+-----------+----------+--------------+ PTV  Full                                                        +---------+---------------+---------+-----------+----------+--------------+ PERO     Full                                                        +---------+---------------+---------+-----------+----------+--------------+     Summary: BILATERAL: - No evidence of deep vein thrombosis seen in the lower extremities, bilaterally. -No evidence of popliteal cyst, bilaterally.   *See table(s) above for measurements and observations. Electronically signed by Carolynn Sayers on 08/22/2023 at 4:50:58 PM.    Final    ECHOCARDIOGRAM COMPLETE Result Date: 08/22/2023    ECHOCARDIOGRAM REPORT   Patient Name:   [name] Date of Exam: 08/22/2023 Medical Rec #:  [id number]   Height:       59.0 in Accession #:    [id number]  Weight:       200.0 lb Date of Birth:  06-Sep-1932   BSA:          1.844 m Patient Age:    89 years    BP:           122/62 mmHg Patient Gender: F           HR:           80 bpm. Exam Location:  Inpatient Procedure: 2D Echo, Color Doppler and Cardiac Doppler Indications:     ABN ECG  History:         Patient has no prior history of Echocardiogram examinations.                  CAD, Stroke and COPD; Risk Factors:Hypertension and                  Dyslipidemia.  Sonographer:     Harriette Bouillon Referring Phys:  Zara Council MD Diagnosing Phys: Zoila Shutter MD IMPRESSIONS  1. Left ventricular ejection fraction, by estimation, is 50 to 55%. Left ventricular ejection fraction by PLAX is 55 %. The left ventricle has low normal function. The left ventricle has no  regional wall motion abnormalities. Left ventricular diastolic parameters are consistent with Grade I diastolic dysfunction (impaired relaxation). Elevated left ventricular end-diastolic pressure. Possible apical/apical septal hypokinesis.  2. Right ventricular systolic function is normal. The right ventricular size is normal. Tricuspid regurgitation signal is inadequate for assessing PA pressure.  3. The mitral valve is abnormal. Trivial mitral valve regurgitation. Moderate to severe mitral annular calcification.  4. The aortic valve was not well visualized. Aortic valve regurgitation is not visualized.  5. The inferior vena cava is normal in size with <50% respiratory variability, suggesting right atrial pressure of 8 mmHg. Comparison(s): No prior Echocardiogram. Changes from prior study are noted. 03/21/2022: LVEF 60-65%. Conclusion(s)/Recommendation(s): Recommend limited echo with Definity contrast to better evaluate for wall motion abnormalities. FINDINGS  Left Ventricle: Left ventricular ejection fraction, by estimation, is 50 to 55%. Left ventricular ejection fraction by PLAX is 55 %. The left ventricle has low normal function. The left ventricle has no regional wall motion abnormalities. The left ventricular internal cavity size was  normal in size. There is no left ventricular hypertrophy. Left ventricular diastolic parameters are consistent with Grade I diastolic dysfunction (impaired relaxation). Elevated left ventricular end-diastolic pressure.  LV Wall Scoring: Possible apical/apical septal hypokinesis. Right Ventricle: The right ventricular size is normal. No increase in right ventricular wall thickness. Right ventricular systolic function is normal. Tricuspid regurgitation signal is inadequate for assessing PA pressure. Left Atrium: Left atrial size was normal in size. Right Atrium: Right atrial size was normal in size. Pericardium: There is no evidence of pericardial effusion. Mitral Valve: The mitral  valve is abnormal. Moderate to severe mitral annular calcification. Trivial mitral valve regurgitation. Tricuspid Valve: The tricuspid valve is not well visualized. Tricuspid valve regurgitation is not demonstrated. Aortic Valve: The aortic valve was not well visualized. Aortic valve regurgitation is not visualized. Pulmonic Valve: The pulmonic valve was normal in structure. Pulmonic valve regurgitation is not visualized. Aorta: The aortic root and ascending aorta are structurally normal, with no evidence of dilitation. Venous: The inferior vena cava is normal in size with less than 50% respiratory variability, suggesting right atrial pressure of 8 mmHg. IAS/Shunts: No atrial level shunt detected by color flow Doppler.  LEFT VENTRICLE PLAX 2D LV EF:         Left            Diastology                ventricular     LV e' medial:    4.90 cm/s                ejection        LV E/e' medial:  18.1                fraction by     LV e' lateral:   6.31 cm/s                PLAX is 55      LV E/e' lateral: 14.0                %. LVIDd:         5.20 cm LVIDs:         3.70 cm LV PW:         1.00 cm LV IVS:        0.70 cm LVOT diam:     1.90 cm LV SV:         50 LV SV Index:   27 LVOT Area:     2.84 cm  RIGHT VENTRICLE             IVC RV S prime:     12.50 cm/s  IVC diam: 2.00 cm TAPSE (M-mode): 2.0 cm LEFT ATRIUM             Index        RIGHT ATRIUM           Index LA diam:        3.00 cm 1.63 cm/m   RA Area:     14.10 cm LA Vol (A2C):   53.9 ml 29.23 ml/m  RA Volume:   34.90 ml  18.93 ml/m LA Vol (A4C):   62.2 ml 33.73 ml/m LA Biplane Vol: 59.5 ml 32.27 ml/m  AORTIC VALVE LVOT Vmax:   82.20 cm/s LVOT Vmean:  52.500 cm/s LVOT VTI:    0.176 m  AORTA Ao Root diam: 2.20 cm Ao Asc diam:  2.60 cm MITRAL VALVE MV Area (PHT):  4.41 cm     SHUNTS MV Decel Time: 172 msec     Systemic VTI:  0.18 m MV E velocity: 88.50 cm/s   Systemic Diam: 1.90 cm MV A velocity: 118.00 cm/s MV E/A ratio:  0.75 Zoila Shutter MD Electronically  signed by Zoila Shutter MD Signature Date/Time: 08/22/2023/4:07:27 PM    Final (Updated)    DG CHEST PORT 1 VIEW Result Date: 08/22/2023 CLINICAL DATA:  [id number] with shortness of breath. EXAM: PORTABLE CHEST 1 VIEW COMPARISON:  Portable chest 08/21/2023 at 5:41. FINDINGS: 5:11 a.m. The heart is enlarged. There are increased bilateral small layering pleural effusions partially obscuring the lung bases with overlying hazy interstitial consolidation/atelectasis. There is increased central vascular distension, and mild generalized increased interstitial consolidation consistent with interstitial edema. The mediastinum is normally outlined. The aortic arch is heavily calcified. Osteopenia. Thoracic spondylosis. IMPRESSION: 1. Increased bilateral small layering pleural effusions with overlying hazy interstitial consolidation/atelectasis. 2. Increased interstitial consolidation consistent with interstitial edema. 3. Cardiomegaly. 4. Aortic atherosclerosis. Electronically Signed   By: Almira Bar M.D.   On: 08/22/2023 05:45   DG Chest Port 1 View Result Date: 08/21/2023 CLINICAL DATA:  Shortness of breath EXAM: PORTABLE CHEST 1 VIEW COMPARISON:  03/20/2022 FINDINGS: Cardiac shadow is stable. Aortic calcifications are noted. Mild central vascular congestion is noted without significant edema. No focal infiltrate is seen. No bony abnormality is noted. IMPRESSION: Mild central vascular congestion without significant edema. Electronically Signed   By: Alcide Clever M.D.   On: 08/21/2023 19:03    Micro Results    Recent Results (from the past 240 hours)  Resp panel by RT-PCR (RSV, Flu A&B, Covid) Anterior Nasal Swab     Status: None   Collection Time: 08/21/23  5:28 PM   Specimen: Anterior Nasal Swab  Result Value Ref Range Status   SARS Coronavirus 2 by RT PCR NEGATIVE NEGATIVE Final   Influenza A by PCR NEGATIVE NEGATIVE Final   Influenza B by PCR NEGATIVE NEGATIVE Final    Comment: (NOTE) The Xpert Xpress  SARS-CoV-2/FLU/RSV plus assay is intended as an aid in the diagnosis of influenza from Nasopharyngeal swab specimens and should not be used as a sole basis for treatment. Nasal washings and aspirates are unacceptable for Xpert Xpress SARS-CoV-2/FLU/RSV testing.  Fact Sheet for Patients: BloggerCourse.com  Fact Sheet for Healthcare Providers: SeriousBroker.it  This test is not yet approved or cleared by the [place] FDA and has been authorized for detection and/or diagnosis of SARS-CoV-2 by FDA under an Emergency Use Authorization (EUA). This EUA will remain in effect (meaning this test can be used) for the duration of the COVID-19 declaration under Section 564(b)(1) of the Act, 21 U.S.C. section 360bbb-3(b)(1), unless the authorization is terminated or revoked.     Resp Syncytial Virus by PCR NEGATIVE NEGATIVE Final    Comment: (NOTE) Fact Sheet for Patients: BloggerCourse.com  Fact Sheet for Healthcare Providers: SeriousBroker.it  This test is not yet approved or cleared by the [place] FDA and has been authorized for detection and/or diagnosis of SARS-CoV-2 by FDA under an Emergency Use Authorization (EUA). This EUA will remain in effect (meaning this test can be used) for the duration of the COVID-19 declaration under Section 564(b)(1) of the Act, 21 U.S.C. section 360bbb-3(b)(1), unless the authorization is terminated or revoked.  Performed at [facility], [address], [place], [place] [address]   Respiratory (~20 pathogens) panel by PCR     Status: None   Collection  Time: 08/22/23  6:37 AM   Specimen: Nasopharyngeal Swab; Respiratory  Result Value Ref Range Status   Adenovirus NOT DETECTED NOT DETECTED Final   Coronavirus 229E NOT DETECTED NOT DETECTED Final    Comment: (NOTE) The Coronavirus on the Respiratory Panel, DOES NOT test for the novel   Coronavirus (2019 nCoV)    Coronavirus HKU1 NOT DETECTED NOT DETECTED Final   Coronavirus NL63 NOT DETECTED NOT DETECTED Final   Coronavirus OC43 NOT DETECTED NOT DETECTED Final   Metapneumovirus NOT DETECTED NOT DETECTED Final   Rhinovirus / Enterovirus NOT DETECTED NOT DETECTED Final   Influenza A NOT DETECTED NOT DETECTED Final   Influenza B NOT DETECTED NOT DETECTED Final   Parainfluenza Virus 1 NOT DETECTED NOT DETECTED Final   Parainfluenza Virus 2 NOT DETECTED NOT DETECTED Final   Parainfluenza Virus 3 NOT DETECTED NOT DETECTED Final   Parainfluenza Virus 4 NOT DETECTED NOT DETECTED Final   Respiratory Syncytial Virus NOT DETECTED NOT DETECTED Final   Bordetella pertussis NOT DETECTED NOT DETECTED Final   Bordetella Parapertussis NOT DETECTED NOT DETECTED Final   Chlamydophila pneumoniae NOT DETECTED NOT DETECTED Final   Mycoplasma pneumoniae NOT DETECTED NOT DETECTED Final    Comment: Performed at [facility], [address], [place], [place] [address]    Today   Subjective    [name] today has no headache,no chest abdominal pain,no new weakness tingling or numbness, feels much better wants to go home today.    Objective   Blood pressure (!) 141/52, pulse 84, temperature 97.8 F (36.6 C), temperature source Oral, resp. rate 20, height 4\' 11"  (1.499 m), weight 90 kg, SpO2 96%.   Intake/Output Summary (Last 24 hours) at 08/30/2023 0958 Last data filed at 08/30/2023 0600 Gross per 24 hour  Intake 250 ml  Output 1750 ml  Net -1500 ml    Exam  Awake Alert, No new F.N deficits,    [place].AT,PERRAL Supple Neck,   Symmetrical Chest wall movement, Good air movement bilaterally, few rales RRR,No Gallops,   +ve B.Sounds, Abd Soft, Non tender,  No Cyanosis, Clubbing or edema    Data Review   Recent Labs  Lab 08/24/23 0559 08/25/23 0614  WBC 9.6 8.0  HGB 13.4 13.4  HCT 42.7 43.8  PLT 142* 118*  MCV 95.5 99.1  MCH 30.0 30.3  MCHC 31.4 30.6  RDW 15.3  15.0  LYMPHSABS  --  2.0  MONOABS  --  0.8  EOSABS  --  0.1  BASOSABS  --  0.0    Recent Labs  Lab 08/24/23 0740 08/25/23 0614 08/27/23 0434 08/28/23 0747 08/28/23 0826 08/29/23 0552  NA 141 141 139  --  140 137  K 3.5 4.1 3.9  --  3.2* 4.3  CL 88* 90* 87*  --  84* 85*  CO2 43* 39* 42*  --  44* 42*  ANIONGAP 10 12 10   --  12 10  GLUCOSE 105* 107* 107*  --  109* 117*  BUN 26* 26* 31*  --  30* 36*  CREATININE 1.06* 1.06* 0.92  --  0.97 1.03*  BNP  --  408.9*  --   --   --   --   MG  --  2.3 2.2 2.3  --  2.4  PHOS  --  5.0* 4.6  --   --  5.4*  CALCIUM 8.8* 8.9 8.8*  --  9.2 9.2    Total Time in preparing paper work, data  evaluation and todays exam - 35 minutes  Signature  -    Susa Raring M.D on 08/30/2023 at 9:58 AM   -  To page go to [email or web address]

## 2023-08-30 NOTE — Discharge Instructions (Addendum)
Follow with Primary MD Noberto Retort, MD in 7 days   Get CBC, CMP, Magnesium, 2 view Chest X ray -  checked next visit with your primary MD   Activity: As tolerated with Full fall precautions use walker/cane & assistance as needed  Disposition home with home health  Diet: Heart Healthy with feeding assistance and aspiration precautions, strict 1.5 L fluid restriction per day  Check your Weight same time everyday, if you gain over 2 pounds, or you develop in leg swelling, experience more shortness of breath or chest pain, call your Primary MD immediately. Follow Cardiac Low Salt Diet and 1.5 lit/day fluid restriction.  Special Instructions: If you have smoked or chewed Tobacco  in the last 2 yrs please stop smoking, stop any regular Alcohol  and or any Recreational drug use.  On your next visit with your primary care physician please Get Medicines reviewed and adjusted.  Please request your Prim.MD to go over all Hospital Tests and Procedure/Radiological results at the follow up, please get all Hospital records sent to your Prim MD by signing hospital release before you go home.  If you experience worsening of your admission symptoms, develop shortness of breath, life threatening emergency, suicidal or homicidal thoughts you must seek medical attention immediately by calling 911 or calling your MD immediately  if symptoms less severe.  You Must read complete instructions/literature along with all the possible adverse reactions/side effects for all the Medicines you take and that have been prescribed to you. Take any new Medicines after you have completely understood and accpet all the possible adverse reactions/side effects.   Do not drive when taking Pain medications.  Do not take more than prescribed Pain, Sleep and Anxiety Medications

## 2023-08-30 NOTE — Plan of Care (Signed)
  Problem: Education: Goal: Knowledge of General Education information will improve Description: Including pain rating scale, medication(s)/side effects and non-pharmacologic comfort measures Outcome: Progressing   Problem: Health Behavior/Discharge Planning: Goal: Ability to manage health-related needs will improve Outcome: Progressing   Problem: Clinical Measurements: Goal: Will remain free from infection Outcome: Progressing Goal: Diagnostic test results will improve Outcome: Progressing Goal: Cardiovascular complication will be avoided Outcome: Progressing   Problem: Activity: Goal: Risk for activity intolerance will decrease Outcome: Progressing   Problem: Coping: Goal: Level of anxiety will decrease Outcome: Progressing

## 2023-08-30 NOTE — TOC Transition Note (Addendum)
Transition of Care [facility]) - Discharge Note   Patient Details  Name: [name] MRN: [id number] Date of Birth: [birth year]  [facility]) CM/SW Contact:  Gordy Clement, RN Phone Number: 08/30/2023, 11:18 AM   Clinical Narrative:    11:51 AM update Rollator ordered thru [employer] to be delivered to home   [facility] met with patient bedside and patient;'s Sister on speaker phone.   Patient was denied SNF so will now DC to home. She lives alone and does have limited support. Her Sister is available. [facility] Furbish will provide Home Health PT and OT. Patient has home O2 and a rolling walker. No additional DME recommended.  Portable O2 to be delivered to room .  Friend to transport RNCM has given resources for [facility] services for additional support           Barriers to Discharge: Continued Medical Work up, English as a second language teacher, SNF Pending bed offer   Patient Goals and CMS Choice Patient states their goals for this hospitalization and ongoing recovery are:: Rehab CMS Medicare.gov Compare Post Acute Care list provided to:: Patient Choice offered to / list presented to : Patient  ownership interest in [facility].provided to:: Patient    Discharge Placement                       Discharge Plan and Services Additional resources added to the After Visit Summary for   In-house Referral: Clinical Social Work   Post Acute Care Choice: Skilled Nursing Facility                               Social Drivers of Health (SDOH) Interventions SDOH Screenings   Food Insecurity: No Food Insecurity (08/23/2023)  Housing: Low Risk  (08/23/2023)  Transportation Needs: No Transportation Needs (08/23/2023)  Utilities: Not At Risk (08/23/2023)  Depression (PHQ2-9): Low Risk  (12/12/2020)  Social Connections: Unknown (08/23/2023)  Tobacco Use: Medium Risk (08/22/2023)     Readmission Risk Interventions     No data to display

## 2023-09-12 ENCOUNTER — Ambulatory Visit: Payer: Medicare Other | Admitting: Physician Assistant

## 2023-09-13 ENCOUNTER — Encounter: Payer: Self-pay | Admitting: Emergency Medicine

## 2023-09-13 ENCOUNTER — Ambulatory Visit: Payer: Medicare Other | Attending: Physician Assistant | Admitting: Emergency Medicine

## 2023-09-13 VITALS — BP 146/68 | HR 95 | Ht 59.0 in | Wt 196.8 lb

## 2023-09-13 DIAGNOSIS — I6523 Occlusion and stenosis of bilateral carotid arteries: Secondary | ICD-10-CM

## 2023-09-13 DIAGNOSIS — I214 Non-ST elevation (NSTEMI) myocardial infarction: Secondary | ICD-10-CM | POA: Diagnosis not present

## 2023-09-13 DIAGNOSIS — E782 Mixed hyperlipidemia: Secondary | ICD-10-CM

## 2023-09-13 DIAGNOSIS — I5032 Chronic diastolic (congestive) heart failure: Secondary | ICD-10-CM

## 2023-09-13 DIAGNOSIS — I10 Essential (primary) hypertension: Secondary | ICD-10-CM

## 2023-09-13 NOTE — Patient Instructions (Signed)
Medication Instructions:  Your physician recommends that you continue on your current medications as directed. Please refer to the Current Medication list given to you today. TODAY:  BMET  *If you need a refill on your cardiac medications before your next appointment, please call your pharmacy*   Lab Work: None ordered  If you have labs (blood work) drawn today and your tests are completely normal, you will receive your results only by: MyChart Message (if you have MyChart) OR A paper copy in the mail If you have any lab test that is abnormal or we need to change your treatment, we will call you to review the results.   Testing/Procedures: None ordered   Follow-Up: At [facility], you and your health needs are our priority.  As part of our continuing mission to provide you with exceptional heart care, we have created designated Provider Care Teams.  These Care Teams include your primary Cardiologist (physician) and Advanced Practice Providers (APPs -  Physician Assistants and Nurse Practitioners) who all work together to provide you with the care you need, when you need it.  We recommend signing up for the patient portal called "MyChart".  Sign up information is provided on this After Visit Summary.  MyChart is used to connect with patients for Virtual Visits (Telemedicine).  Patients are able to view lab/test results, encounter notes, upcoming appointments, etc.  Non-urgent messages can be sent to your provider as well.   To learn more about what you can do with MyChart, go to [email or web address].    Your next appointment:   6 month(s)  Provider:   Rollene Rotunda, MD     Other Instructions   1st Floor: - Lobby - Registration  - Pharmacy  - Lab - Cafe  2nd Floor: - PV Lab - Diagnostic Testing (echo, CT, nuclear med)  3rd Floor: - Vacant  4th Floor: - TCTS (cardiothoracic surgery) - AFib Clinic - Structural Heart Clinic - Vascular Surgery  - Vascular  Ultrasound  5th Floor: - HeartCare Cardiology (general and EP) - Clinical Pharmacy for coumadin, hypertension, lipid, weight-loss medications, and med management appointments    Valet parking services will be available as well.

## 2023-09-13 NOTE — Progress Notes (Signed)
Cardiology Office Note:    Date:  09/13/2023  ID:  Ruben Im, DOB 08-11-1933, MRN [id number] PCP: Noberto Retort, MD  [facility] Providers Cardiologist:  Rollene Rotunda, MD       Patient Profile:      PATRICIAANN RABANAL is a 89 y.o. female with visit-pertinent history of COPD on 2 L home O2, mild pHTN chronic HFpEF, HTN, HLD, CVA, obesity, severe carotid artery disease  Neurology last carotid duplex 08/2022 was some concern for accuracy given habitus: CT pursued showing widespread advanced atherosclerosis with 62% brachiocephalic artery stenosis, 77% distal or CCA, less than 50% R ICA, 76% L CCA, radiographic string sign of LICA, left ICA siphon moderate to severe supraclinoid stenosis, moderate to severe stenosis of the right vertebral artery origin.  She was referred to vascular surgery patient was not interested in any surgery.    She was admitted to the hospital on 08/21/2023.  She was sent to the ED by her PCP in the setting of shortness of breath and hypoxia.  In the ED she desatted to the 70s off oxygen, chest x-ray showed bilateral pulmonary edema, BNP 323, troponin 17, 155.  EKG revealing diffuse ST depression.  Cardiology was consulted due to NSTEMI, who suspected patient suffered an MI with resultant flash pulmonary edema and respiratory failure as well as acute encephalopathy due to hypoxia/hypercarbia.  She was started on BiPAP along with aggressive diuresis.  She declined LHC for further ischemic workup and preferred medical management.  Echocardiogram showed LVEF 50 to 55%, no RWMA/possible apical/septal hypokinesis, grade 1 DD, normal RV B, trivial MR, moderate to severe MAC, IVC normal.  She was discharged on aspirin 81 mg, Farxiga 10 mg, metoprolol succinate 50 mg, Crestor 10 mg, Lasix 40 mg twice daily.      History of Present Illness:  Discussed the use of AI scribe software for clinical note transcription with the patient, who gave verbal consent to proceed.  INAARA TYE is a 89 y.o. female who returns for hospital follow-up for NSTEMI.   She arrives today with her sister.  Since her discharge from the hospital, she reports feeling better.  She notes that her chronic shortness of breath has improved and she has experienced no chest pain or exertional angina.  She is compliant with her medication regimen. She also reports improved compliance with his oxygen therapy, which she previously neglected.  She notes that she felt as if it was the neglect of her home O2 that had led her to the ED.  She is not interested in further invasive procedures, further medication titration, or stress testing.     Review of Systems  Constitutional: Negative for weight gain and weight loss.  Cardiovascular:  Negative for chest pain, claudication, dyspnea on exertion, irregular heartbeat, leg swelling, near-syncope, orthopnea, palpitations, paroxysmal nocturnal dyspnea and syncope.  Respiratory:  Positive for shortness of breath. Negative for cough and hemoptysis.   Gastrointestinal:  Negative for abdominal pain, hematochezia and melena.  Genitourinary:  Negative for hematuria.  Neurological:  Negative for dizziness and light-headedness.     See HPI     Home Medications:    Prior to Admission medications   Medication Sig Start Date End Date Taking? Authorizing Provider  albuterol (PROVENTIL) (2.5 MG/3ML) 0.083% nebulizer solution Take 3 mLs (2.5 mg total) by nebulization every 4 (four) hours as needed for wheezing or shortness of breath. 05/02/23   Glenford Bayley, NP  aspirin EC 81 MG  EC tablet Take 1 tablet (81 mg total) by mouth daily. Swallow whole. 10/27/20   Ghimire, Werner Lean, MD  budesonide (PULMICORT) 0.25 MG/2ML nebulizer solution Take 2 mLs (0.25 mg total) by nebulization in the morning and at bedtime. 05/02/23   Glenford Bayley, NP  Cholecalciferol (VITAMIN D PO) Take 1,000 Units by mouth daily.    [provider]  dapagliflozin propanediol (FARXIGA)  10 MG TABS tablet Take 1 tablet (10 mg total) by mouth daily. 08/30/23   Leroy Sea, MD  docusate sodium (COLACE) 100 MG capsule Take 100 mg by mouth daily.    [provider]  furosemide (LASIX) 40 MG tablet Take 1 tablet (40 mg total) by mouth 2 (two) times daily. 08/30/23 08/29/24  Leroy Sea, MD  Glycerin-Polysorbate 80 (REFRESH DRY EYE THERAPY OP) Apply 2 drops to eye as needed (dry eyes).    [provider]  ipratropium-albuterol (DUONEB) 0.5-2.5 (3) MG/3ML SOLN Take 3 mLs by nebulization 2 (two) times daily. 05/02/23   Glenford Bayley, NP  metoprolol succinate (TOPROL-XL) 50 MG 24 hr tablet Take 1 tablet (50 mg total) by mouth daily. Take with or immediately following a meal. 08/30/23   Leroy Sea, MD  OXYGEN Inhale into the lungs as directed. 2L    [provider]  rosuvastatin (CRESTOR) 10 MG tablet Take 1 tablet (10 mg total) by mouth daily. 08/30/23   Leroy Sea, MD   Studies Reviewed:       Echocardiogram 08/22/2023  1. Left ventricular ejection fraction, by estimation, is 50 to 55%. Left  ventricular ejection fraction by PLAX is 55 %. The left ventricle has low  normal function. The left ventricle has no regional wall motion  abnormalities. Left ventricular diastolic  parameters are consistent with Grade I diastolic dysfunction (impaired  relaxation). Elevated left ventricular end-diastolic pressure. Possible  apical/apical septal hypokinesis.   2. Right ventricular systolic function is normal. The right ventricular  size is normal. Tricuspid regurgitation signal is inadequate for assessing  PA pressure.   3. The mitral valve is abnormal. Trivial mitral valve regurgitation.  Moderate to severe mitral annular calcification.   4. The aortic valve was not well visualized. Aortic valve regurgitation  is not visualized.   5. The inferior vena cava is normal in size with <50% respiratory  variability, suggesting right atrial pressure  of 8 mmHg.   Risk Assessment/Calculations:         Physical Exam:   VS:  BP (!) 146/68   Pulse 95   Ht 4\' 11"  (1.499 m)   Wt 196 lb 12.8 oz (89.3 kg)   SpO2 93%   BMI 39.75 kg/m    Wt Readings from Last 3 Encounters:  09/13/23 196 lb 12.8 oz (89.3 kg)  08/30/23 198 lb 6.6 oz (90 kg)  05/02/23 210 lb 9.6 oz (95.5 kg)    Constitutional:      Appearance: Normal and healthy appearance. Not in distress.  HENT:     Head: Normocephalic.  Neck:     Vascular: JVD normal.  Pulmonary:     Effort: Pulmonary effort is normal.     Breath sounds: Normal breath sounds.  Chest:     Chest wall: Not tender to palpatation.  Cardiovascular:     PMI at left midclavicular line. Normal rate. Regular rhythm. Normal S1. Normal S2.      Murmurs: There is no murmur.     No gallop.  No click. No  rub.  Pulses:    Intact distal pulses.  Edema:    Peripheral edema absent.  Musculoskeletal: Normal range of motion.     Cervical back: Normal range of motion and neck supple. Skin:    General: Skin is warm and dry.  Neurological:     General: No focal deficit present.     Mental Status: Alert, oriented to person, place, and time and oriented to person, place and time.  Psychiatric:        Mood and Affect: Mood and affect normal.        Behavior: Behavior is cooperative.        Thought Content: Thought content normal.        Assessment and Plan:  NSTEMI unclear if type I or type II Admitted 08/21/2023 with acute on chronic hypoxic respiratory failure 2/2 pulmonary edema with acute encephalopathy 2/2 hypoxia/hypercapnia.  Course complicated by an elevated troponin and possible NSTEMI with troponin 17, 155.  She declined LHC for further ischemic workup and was treated with medical management. She does not wish to add any additional medication at this time (will hold off empiric DAPT) -She is not interested in further ischemic evaluation today and declines any further surgeries as needed as she is happy  with her health at this time -Her symptoms have improved significantly since she has been adherent to her oxygen at home.  She is without any chest pain or exertional angina at this time. -Continue aspirin 81 mg daily, metoprolol XL 50 mg daily, rosuvastatin 10 mg daily  Acute on chronic HFpEF Echo 08/2023 LVEF 50-55%, possible apical/apical septal hypokinesis, grade 1 DD Euvolemic and well-compensated on exam. She feel much better now that sh ehas been adherent to constant home O2.  -She politely defers further titration/addition of GDMT -Continue Farxiga 10mg  daily, Lasix 40mg  BID, metoprolol-XL 50mg  daily   Severe carotid artery disease Last seen by vascular on 09/2022 where there was a long discussion regarding surgery. Patient made it very clear she was not interested in any type of surgery -Follow up with vascular as needed   Hypertension BP today 146/68. Deferred repeat BP and notes home blood pressure averages 130s/80s. Offered to start Losartan today to support with BP control and optimize GDMT for HFpEF. She politely defers any additional medication.  Monitor BP at home and alert office for systolic BP >140 -Continue Metoprolol-XL 50mg  daily   Hyperlipidemia LDL 96 on 08/2023. Currently not at goal. She is not interested in further titration of stain to reach goal and defers additional management  -Continue Rosuvastatin 10mg  daily             Dispo:  Return in about 6 months (around 03/12/2024). Per discussion, patient prefers to follow-up with Dr. Antoine Poche instead of Dr. Izora Ribas. Dr. Izora Ribas is OK with this switch.  Signed, Denyce Robert, NP

## 2023-09-14 LAB — BASIC METABOLIC PANEL
BUN/Creatinine Ratio: 18 (ref 12–28)
BUN: 15 mg/dL (ref 8–27)
CO2: 36 mmol/L — ABNORMAL HIGH (ref 20–29)
Calcium: 9.8 mg/dL (ref 8.7–10.3)
Chloride: 95 mmol/L — ABNORMAL LOW (ref 96–106)
Creatinine, Ser: 0.84 mg/dL (ref 0.57–1.00)
Glucose: 97 mg/dL (ref 70–99)
Potassium: 4.3 mmol/L (ref 3.5–5.2)
Sodium: 144 mmol/L (ref 134–144)
eGFR: 67 mL/min/{1.73_m2} (ref >59)

## 2023-10-22 ENCOUNTER — Encounter: Payer: Self-pay | Admitting: Podiatry

## 2023-10-22 ENCOUNTER — Ambulatory Visit: Payer: Medicare Other | Admitting: Podiatry

## 2023-10-22 VITALS — Ht 59.0 in | Wt 196.8 lb

## 2023-10-22 DIAGNOSIS — B351 Tinea unguium: Secondary | ICD-10-CM | POA: Diagnosis not present

## 2023-10-22 DIAGNOSIS — M79674 Pain in right toe(s): Secondary | ICD-10-CM | POA: Diagnosis not present

## 2023-10-22 DIAGNOSIS — M79675 Pain in left toe(s): Secondary | ICD-10-CM

## 2023-10-22 NOTE — Progress Notes (Signed)
  Subjective:  Patient ID: [name], female    DOB: June 09, 1933,  MRN: [id number]  [name] presents to clinic today for: painful, elongated thickened toenails x 10 which are symptomatic when wearing enclosed shoe gear. This interferes with his/her daily activities.  Patient states she was hospitalized for COPD in January. She is now on supplemental oxygen. Chief Complaint  Patient presents with   RFC    PCP is Noberto Retort, MD.  Allergies  Allergen Reactions   Atorvastatin Other (See Comments)    Myalgias   Ceftin [Cefuroxime] Diarrhea   Cefuroxime Axetil Diarrhea   Clindamycin Hcl Itching   Clindamycin/Lincomycin Itching   Dapagliflozin     Other Reaction(s): numbness/tingling   Moxifloxacin Itching   Simvastatin Other (See Comments)    myalgias   Doxycycline Hyclate Diarrhea and Rash   Penicillins Rash    Review of Systems: Negative except as noted in the HPI.  Objective: No changes noted in today's physical examination. There were no vitals filed for this visit.  [name] is a pleasant 89 y.o. female obese in NAD. AAO x 3. On supplemental oxygen via O2 concentrator.  Vascular Examination: Capillary refill time <3 seconds b/l LE. Palpable pedal pulses b/l LE. Digital hair decreased b/l. Skin temperature gradient WNL b/l. +1 pitting edema noted BLE. Evidence of chronic venous insufficiency b/l LE.Marland Kitchen  Dermatological Examination: Pedal skin with normal turgor, texture and tone b/l. No open wounds. No interdigital macerations b/l. Toenails 1-5 b/l thickened, discolored, dystrophic with subungual debris. There is pain on palpation to dorsal aspect of nailplates. No hyperkeratotic nor porokeratotic lesions present on today's visit.Marland Kitchen  Neurological Examination: Protective sensation intact with 10 gram monofilament b/l LE. Vibratory sensation intact b/l LE.   Musculoskeletal Examination: Muscle strength 5/5 to all lower extremity muscle groups bilaterally. HAV  with bunion deformity noted b/l LE. Hammertoe deformity noted 2-5 b/l. Utilizes motorized chair for mobility assistance.     Latest Ref Rng & Units 08/22/2023    5:00 AM  Hemoglobin A1C  Hemoglobin-A1c 4.8 - 5.6 % 6.1    Assessment/Plan: 1. Pain due to onychomycosis of toenails of both feet     -Consent given for treatment as described below: -Examined patient. -Continue supportive shoe gear daily. -Toenails 1-5 b/l were debrided in length and girth with sterile nail nippers and dremel without iatrogenic bleeding.  -Patient/POA to call should there be question/concern in the interim.   Return in about 3 months (around 01/22/2024).  Freddie Breech, DPM      [place] LOCATION: [address] [place] [address]                   Office [phone]   [facility] LOCATION: [address] [place], [place] [address] Office [phone]

## 2023-12-04 ENCOUNTER — Other Ambulatory Visit: Payer: Self-pay | Admitting: Primary Care

## 2023-12-04 ENCOUNTER — Ambulatory Visit: Payer: Medicare Other | Admitting: Pulmonary Disease

## 2023-12-05 ENCOUNTER — Telehealth: Payer: Self-pay | Admitting: *Deleted

## 2023-12-05 ENCOUNTER — Encounter: Payer: Self-pay | Admitting: Pulmonary Disease

## 2023-12-05 ENCOUNTER — Ambulatory Visit: Payer: Medicare Other | Admitting: Pulmonary Disease

## 2023-12-05 VITALS — BP 134/84 | HR 83 | Ht 59.0 in | Wt 198.0 lb

## 2023-12-05 DIAGNOSIS — J432 Centrilobular emphysema: Secondary | ICD-10-CM

## 2023-12-05 DIAGNOSIS — Z87891 Personal history of nicotine dependence: Secondary | ICD-10-CM

## 2023-12-05 DIAGNOSIS — J9611 Chronic respiratory failure with hypoxia: Secondary | ICD-10-CM

## 2023-12-05 DIAGNOSIS — Z9981 Dependence on supplemental oxygen: Secondary | ICD-10-CM

## 2023-12-05 MED ORDER — ALBUTEROL SULFATE HFA 108 (90 BASE) MCG/ACT IN AERS: 2.0000 | INHALATION_SPRAY | Freq: Four times a day (QID) | RESPIRATORY_TRACT | 6 refills | Status: AC | PRN

## 2023-12-05 NOTE — Progress Notes (Unsigned)
 Synopsis: Referred in September 2023 for Respiratory Failure by Linda Grave, MD  Subjective:   PATIENT ID: [name] General GENDER: female DOB: August 12, 1933, MRN: [id number]   HPI  Chief Complaint  Patient presents with   Follow-up   [name] is an 89 year old woman, former smoker with history of CVA, obesity, diastolic heart failure and COPD who returns to pulmonary clinic for respiratory failure.   She was admitted 08/21/2023 for acute on chronic respiratory failure due to COPD exacerbation and heart failure exacerbation.  OV 08/22/22 She has been using duonebs and budesonide  nebs twice daily with no issues. Reports her breathing has been ok.   PFTs today show mild obstruction with significant bronchodilator response.   Initial OV 04/20/22 She was admitted 8/7 to 8/11 for acute hypoxemic and hypercapnic respiratory failure with altered mentation. She was treated with Bipap and diuresis with improvement in her condition. She has been feeling much better since discharge. She saw Dr. Felipe Horton of cardiology 04/09/22, note reviewed  She has oxygen at home and using 2L throughout the day. She is not using it at night. She is doing duoneb treatments twice daily.   She tried CPAP at the hospital but she felt claustrophobic and did not tolerate it.   She quit smoking in 1973, she smoked for 10 years. She worked at [employer] previously and [employer]. She had second hand smoke at home and at work.   Past Medical History:  Diagnosis Date   Acute back pain with sciatica, unspecified laterality    Allergic rhinitis    BMI 40.0-44.9, adult (HCC)    Carotid artery disease (HCC) 05/11/2022   Carotid US  02/2022: Bilateral ICA 40-59   Cerebrovascular disease    Chronic diastolic (congestive) heart failure (HCC)    Colon polyp    COPD (chronic obstructive pulmonary disease) (HCC)    MILD   Disorder of bone    Estrogen deficiency    Excessive sleepiness    Hyperlipidemia    Hypertension    Incontinence of  urine    Irregular heartbeat    Low back pain    Morbid obesity (HCC)    OA (osteoarthritis)    OF THE KNEES   OAB (overactive bladder)    Osteopenia    Osteopenia    Primary osteoarthritis of both knees    Pure hypercholesterolemia    Shingles    Shingles    Stroke (HCC)    Thyroid  cyst    Thyroid  cyst    Ulnar neuropathy of both upper extremities    Vertigo      Family History  Problem Relation Age of Onset   CAD Father    Breast cancer Neg Hx      Social History   Socioeconomic History   Marital status: Widowed    Spouse name: Not on file   Number of children: Not on file   Years of education: Not on file   Highest education level: Not on file  Occupational History   Not on file  Tobacco Use   Smoking status: Former    Types: Cigarettes   Smokeless tobacco: Never  Vaping Use   Vaping status: Never Used  Substance and Sexual Activity   Alcohol use: Not Currently   Drug use: Never   Sexual activity: Not on file  Other Topics Concern   Not on file  Social History Narrative   Lives alone   Right Handed   Drinks decaf   Social  Drivers of Corporate investment banker Strain: Not on file  Food Insecurity: No Food Insecurity (08/23/2023)   Hunger Vital Sign    Worried About Running Out of Food in the Last Year: Never true    Ran Out of Food in the Last Year: Never true  Transportation Needs: No Transportation Needs (08/23/2023)   PRAPARE - Administrator, Civil Service (Medical): No    Lack of Transportation (Non-Medical): No  Physical Activity: Not on file  Stress: Not on file  Social Connections: Unknown (08/23/2023)   Social Connection and Isolation Panel [NHANES]    Frequency of Communication with Friends and Family: More than three times a week    Frequency of Social Gatherings with Friends and Family: Patient unable to answer    Attends Religious Services: Patient unable to answer    Active Member of Clubs or Organizations: Patient unable  to answer    Attends Banker Meetings: Patient unable to answer    Marital Status: Patient unable to answer  Intimate Partner Violence: Unknown (08/23/2023)   Humiliation, Afraid, Rape, and Kick questionnaire    Fear of Current or Ex-Partner: No    Emotionally Abused: No    Physically Abused: No    Sexually Abused: Not on file     Allergies  Allergen Reactions   Atorvastatin Other (See Comments)    Myalgias   Ceftin [Cefuroxime] Diarrhea   Cefuroxime Axetil Diarrhea   Clindamycin Hcl Itching   Clindamycin/Lincomycin Itching   Dapagliflozin      Other Reaction(s): numbness/tingling   Moxifloxacin Itching   Simvastatin  Other (See Comments)    myalgias   Doxycycline Hyclate Diarrhea and Rash   Penicillins Rash     Outpatient Medications Prior to Visit  Medication Sig Dispense Refill   albuterol  (PROVENTIL ) (2.5 MG/3ML) 0.083% nebulizer solution Take 3 mLs (2.5 mg total) by nebulization every 4 (four) hours as needed for wheezing or shortness of breath. 75 mL 1   aspirin  EC 81 MG EC tablet Take 1 tablet (81 mg total) by mouth daily. Swallow whole. 30 tablet 11   budesonide  (PULMICORT ) 0.25 MG/2ML nebulizer solution Take 2 mLs (0.25 mg total) by nebulization in the morning and at bedtime. 60 mL 12   Cholecalciferol  (VITAMIN D  PO) Take 1,000 Units by mouth daily.     dapagliflozin  propanediol (FARXIGA ) 10 MG TABS tablet Take 1 tablet (10 mg total) by mouth daily. 30 tablet 1   docusate sodium  (COLACE) 100 MG capsule Take 100 mg by mouth daily.     furosemide  (LASIX ) 40 MG tablet Take 1 tablet (40 mg total) by mouth 2 (two) times daily. 60 tablet 1   Glycerin-Polysorbate 80 (REFRESH DRY EYE THERAPY OP) Apply 2 drops to eye as needed (dry eyes).     ipratropium-albuterol  (DUONEB) 0.5-2.5 (3) MG/3ML SOLN USE 1 VIAL VIA NEBULIZER TWICE DAILY 90 mL 0   metoprolol  succinate (TOPROL -XL) 50 MG 24 hr tablet Take 1 tablet (50 mg total) by mouth daily. Take with or immediately  following a meal. 30 tablet 1   OXYGEN Inhale into the lungs as directed. 2L     rosuvastatin  (CRESTOR ) 10 MG tablet Take 1 tablet (10 mg total) by mouth daily. (Patient taking differently: Take 40 mg by mouth daily.) 30 tablet 1   No facility-administered medications prior to visit.    Review of Systems  Constitutional:  Negative for chills, fever, malaise/fatigue and weight loss.  HENT:  Negative for congestion, sinus  pain and sore throat.   Eyes: Negative.   Respiratory:  Positive for shortness of breath. Negative for cough, hemoptysis, sputum production and wheezing.   Cardiovascular:  Positive for leg swelling. Negative for chest pain, palpitations, orthopnea and claudication.  Gastrointestinal:  Negative for abdominal pain, heartburn, nausea and vomiting.  Genitourinary: Negative.   Musculoskeletal:  Negative for joint pain and myalgias.  Skin:  Negative for rash.  Neurological:  Negative for weakness.  Endo/Heme/Allergies: Negative.   Psychiatric/Behavioral: Negative.     Objective:   Vitals:   12/05/23 1338  BP: 134/84  Pulse: 83  SpO2: 93%  Weight: 198 lb (89.8 kg)  Height: 4\' 11"  (1.499 m)   Physical Exam Constitutional:      General: She is not in acute distress.    Appearance: She is obese. She is not ill-appearing.  HENT:     Head: Normocephalic and atraumatic.  Eyes:     General: No scleral icterus.    Conjunctiva/sclera: Conjunctivae normal.  Cardiovascular:     Rate and Rhythm: Normal rate and regular rhythm.     Pulses: Normal pulses.     Heart sounds: Normal heart sounds. No murmur heard. Pulmonary:     Effort: Pulmonary effort is normal.     Breath sounds: Normal breath sounds. Decreased air movement present. No wheezing, rhonchi or rales.  Musculoskeletal:     Right lower leg: No edema.     Left lower leg: No edema.  Skin:    General: Skin is warm and dry.  Neurological:     General: No focal deficit present.     Mental Status: She is alert.     CBC    Component Value Date/Time   WBC 8.0 08/25/2023 0614   RBC 4.42 08/25/2023 0614   HGB 13.4 08/25/2023 0614   HCT 43.8 08/25/2023 0614   PLT 118 (L) 08/25/2023 0614   MCV 99.1 08/25/2023 0614   MCH 30.3 08/25/2023 0614   MCHC 30.6 08/25/2023 0614   RDW 15.0 08/25/2023 0614   LYMPHSABS 2.0 08/25/2023 0614   MONOABS 0.8 08/25/2023 0614   EOSABS 0.1 08/25/2023 0614   BASOSABS 0.0 08/25/2023 0614      Latest Ref Rng & Units 09/13/2023   12:49 PM 08/29/2023    5:52 AM 08/28/2023    8:26 AM  BMP  Glucose 70 - 99 mg/dL 97  956  213   BUN 8 - 27 mg/dL 15  36  30   Creatinine 0.57 - 1.00 mg/dL 0.86  5.78  4.69   BUN/Creat Ratio 12 - 28 18     Sodium 134 - 144 mmol/L 144  137  140   Potassium 3.5 - 5.2 mmol/L 4.3  4.3  3.2   Chloride 96 - 106 mmol/L 95  85  84   CO2 20 - 29 mmol/L 36  42  44   Calcium  8.7 - 10.3 mg/dL 9.8  9.2  9.2    Chest imaging: CXR 03/20/22 1. Improved lung base ventilation since yesterday. Residual patchy opacity and possible small pleural effusions. 2. No new cardiopulmonary abnormality.  CT Chest 06/03/06 Emphysema noted  PFT:    Latest Ref Rng & Units 08/22/2022   12:41 PM  PFT Results  FVC-Pre L 1.03   FVC-Predicted Pre % 142   FVC-Post L 1.12   FVC-Predicted Post % 155   Pre FEV1/FVC % % 70   Post FEV1/FCV % % 78   FEV1-Pre L 0.72  FEV1-Predicted Pre % 154   FEV1-Post L 0.88   DLCO uncorrected ml/min/mmHg 12.00   DLCO UNC% % 114   DLCO corrected ml/min/mmHg 12.00   DLCO COR %Predicted % 114   DLVA Predicted % 86   TLC L 4.10   TLC % Predicted % 142   RV % Predicted % 153     Labs:  Path:  Echo 03/21/22: LV EF 60-65%. RV systolic function and size is normal. Mildly elevated PASP. LA and RA normal size.   Heart Catheterization:  ONO 04/2022 51 mins with SpO2 less than 88%  Assessment & Plan:   No diagnosis found.  Discussion: Syan Cullimore is an 89 year old woman, former smoker with history of CVA, obesity, diastolic  heart failure and COPD who is referred to pulmonary clinic for respiratory failure.   Her resting room air oxygen level is 95% today and has been stable when she checks it at home.  She is to use 2L of oxygen with ambulation and can be on room air at rest. We will repeat an overnight study to confirm she needs night time oxygen.   She has history of emphysema as noted on CT Chest scan in 2007. PFTs show mild obstruction with significant bronchodilator response.  She is to continue duoneb nebulizer treatments q6 hours and budesonide  nebulizer 0.25mg  twice daily for ICS/LAMA/LABA therapy.   She did not tolerate Bipap mask at the hospital due to feeling claustrophobic so we will hold off on sleep testing.   Follow up in 6 months.  Duaine German, MD [facility] Office: [phone]   Current Outpatient Medications:    albuterol  (PROVENTIL ) (2.5 MG/3ML) 0.083% nebulizer solution, Take 3 mLs (2.5 mg total) by nebulization every 4 (four) hours as needed for wheezing or shortness of breath., Disp: 75 mL, Rfl: 1   aspirin  EC 81 MG EC tablet, Take 1 tablet (81 mg total) by mouth daily. Swallow whole., Disp: 30 tablet, Rfl: 11   budesonide  (PULMICORT ) 0.25 MG/2ML nebulizer solution, Take 2 mLs (0.25 mg total) by nebulization in the morning and at bedtime., Disp: 60 mL, Rfl: 12   Cholecalciferol  (VITAMIN D  PO), Take 1,000 Units by mouth daily., Disp: , Rfl:    dapagliflozin  propanediol (FARXIGA ) 10 MG TABS tablet, Take 1 tablet (10 mg total) by mouth daily., Disp: 30 tablet, Rfl: 1   docusate sodium  (COLACE) 100 MG capsule, Take 100 mg by mouth daily., Disp: , Rfl:    furosemide  (LASIX ) 40 MG tablet, Take 1 tablet (40 mg total) by mouth 2 (two) times daily., Disp: 60 tablet, Rfl: 1   Glycerin-Polysorbate 80 (REFRESH DRY EYE THERAPY OP), Apply 2 drops to eye as needed (dry eyes)., Disp: , Rfl:    ipratropium-albuterol  (DUONEB) 0.5-2.5 (3) MG/3ML SOLN, USE 1 VIAL VIA NEBULIZER  TWICE DAILY, Disp: 90 mL, Rfl: 0   metoprolol  succinate (TOPROL -XL) 50 MG 24 hr tablet, Take 1 tablet (50 mg total) by mouth daily. Take with or immediately following a meal., Disp: 30 tablet, Rfl: 1   OXYGEN, Inhale into the lungs as directed. 2L, Disp: , Rfl:    rosuvastatin  (CRESTOR ) 10 MG tablet, Take 1 tablet (10 mg total) by mouth daily. (Patient taking differently: Take 40 mg by mouth daily.), Disp: 30 tablet, Rfl: 1

## 2023-12-05 NOTE — Patient Instructions (Addendum)
 Continue duonebs 2-3 times per day (purple)  Continue budesonide  twice daily (orange)  Continue to use 2L of oxygen throughout the day and night when sleeping  Use albuterol  inhaler as needed  Follow up in 6 months

## 2023-12-05 NOTE — Telephone Encounter (Signed)
 Copied from CRM [id number]. Topic: Clinical - Medication Question >> Dec 05, 2023  3:23 PM [name] wrote: Reason for CRM: Patient just seen, states her paperwork states to use inhaler as needed - patient states she does not have an inhaler but would prefer to use one if that's what Dr.Dewald recommends.   Callback number: [phone]  Called and spoke with patient, she states that her AVS states to use albuterol  inhaler as needed, but she does not have any inhalers.  All of her medications are nebulized medications.  I advised her I wouls sen d Prince message to Dr. Diania Fortes to get clarification and once we hear back from him we will call her back with his recommendations.  She verbalized understanding.  Dr. Diania Fortes,  The patient wants to know if she is supposed to have an albuterol  inhaler?  All her medications are nebulizers.  Please clarify.  Thank you.

## 2023-12-05 NOTE — Telephone Encounter (Signed)
 Albuterol  inhaler to use as needed. She can use either albuterol  inhale or nebulizer treatment as needed every 4-6 hours for break through symptoms despite using her duoneb and budesonide  nebds.  JD

## 2023-12-06 ENCOUNTER — Telehealth: Payer: Self-pay

## 2023-12-06 ENCOUNTER — Encounter: Payer: Self-pay | Admitting: Pulmonary Disease

## 2023-12-06 MED ORDER — IPRATROPIUM-ALBUTEROL 0.5-2.5 (3) MG/3ML IN SOLN: 3.0000 mL | Freq: Three times a day (TID) | RESPIRATORY_TRACT | 11 refills | Status: AC

## 2023-12-06 NOTE — Telephone Encounter (Signed)
 No further action required has since been resolved

## 2023-12-06 NOTE — Telephone Encounter (Signed)
 LM, okay per HIPAA.

## 2024-01-15 ENCOUNTER — Encounter: Payer: Self-pay | Admitting: Cardiology

## 2024-02-05 ENCOUNTER — Ambulatory Visit (INDEPENDENT_AMBULATORY_CARE_PROVIDER_SITE_OTHER): Admitting: Podiatry

## 2024-02-05 ENCOUNTER — Encounter: Payer: Self-pay | Admitting: Podiatry

## 2024-02-05 DIAGNOSIS — B351 Tinea unguium: Secondary | ICD-10-CM

## 2024-02-05 DIAGNOSIS — M79674 Pain in right toe(s): Secondary | ICD-10-CM | POA: Diagnosis not present

## 2024-02-05 DIAGNOSIS — M79675 Pain in left toe(s): Secondary | ICD-10-CM | POA: Diagnosis not present

## 2024-02-09 NOTE — Progress Notes (Signed)
  Subjective:  Patient ID: [name], female    DOB: 11-09-1932,  MRN: [id number]  [name] presents to clinic today for: painful thick toenails that are difficult to trim. Pain interferes with ambulation. Aggravating factors include wearing enclosed shoe gear. Pain is relieved with periodic professional debridement.   PCP is Arloa [name], MD. [name] 12/31/2023.  Allergies  Allergen Reactions   Atorvastatin Other (See Comments)    Myalgias   Ceftin [Cefuroxime] Diarrhea   Cefuroxime Axetil Diarrhea   Clindamycin Hcl Itching   Clindamycin/Lincomycin Itching   Dapagliflozin      Other Reaction(s): numbness/tingling   Moxifloxacin Itching   Simvastatin  Other (See Comments)    myalgias   Doxycycline Hyclate Diarrhea and Rash   Penicillins Rash    Review of Systems: Negative except as noted in the HPI.  Objective: No changes noted in today's physical examination. There were no vitals filed for this visit.  [name] is a pleasant 89 y.o. female in NAD. AAO x 3.  Vascular Examination: Capillary refill time <3 seconds b/l LE. Palpable pedal pulses b/l LE. Digital hair present b/l. No pedal edema b/l. Skin temperature gradient WNL b/l. No varicosities b/l. Trace edema noted BLE.[name]  Dermatological Examination: Pedal skin with normal turgor, texture and tone b/l. No open wounds. No interdigital macerations b/l. Toenails 1-5 b/l thickened, discolored, dystrophic with subungual debris. There is pain on palpation to dorsal aspect of nailplates. No corns, calluses nor porokeratotic lesions noted..  Neurological Examination: Protective sensation intact with 10 gram monofilament b/l LE. Vibratory sensation intact b/l LE.   Musculoskeletal Examination: Normal muscle strength 5/5 to all lower extremity muscle groups bilaterally. No pain, crepitus or joint limitation noted with ROM b/l LE. HAV with bunion bilaterally and hammertoes 2-5 b/l.. Mobility via motorized chair  assistance.     Latest Ref Rng & Units 08/22/2023    5:00 AM  Hemoglobin A1C  Hemoglobin-A1c 4.8 - 5.6 % 6.1    Assessment/Plan: 1. Pain due to onychomycosis of toenails of both feet     Patient was evaluated and treated. All patient's and/or POA's questions/concerns addressed on today's visit. Mycotic toenails 1-5 debrided in length and girth without incident. Continue soft, supportive shoe gear daily. Report any pedal injuries to medical professional. Call office if there are any quesitons/concerns. -Patient/POA to call should there be question/concern in the interim.   Return in about 3 months (around 05/07/2024).  [name], DPM      [place] LOCATION: [address], [place] [address]                   Office [phone]   [facility] LOCATION: [address] [place], [place] [address] Office [phone]

## 2024-05-06 ENCOUNTER — Ambulatory Visit (INDEPENDENT_AMBULATORY_CARE_PROVIDER_SITE_OTHER): Admitting: Podiatry

## 2024-05-06 ENCOUNTER — Encounter: Payer: Self-pay | Admitting: Podiatry

## 2024-05-06 VITALS — Ht 59.0 in | Wt 198.0 lb

## 2024-05-06 DIAGNOSIS — M79674 Pain in right toe(s): Secondary | ICD-10-CM | POA: Diagnosis not present

## 2024-05-06 DIAGNOSIS — M79675 Pain in left toe(s): Secondary | ICD-10-CM

## 2024-05-06 DIAGNOSIS — B351 Tinea unguium: Secondary | ICD-10-CM

## 2024-05-06 NOTE — Progress Notes (Signed)
  Subjective:  Patient ID: [name], female    DOB: November 28, 1932,  MRN: [id number]  89 y.o. female presents painful mycotic toenails x 10 which interfere with daily activities. Pain is relieved with periodic professional debridement. Chief Complaint  Patient presents with   Nail Problem    Rm 16 RFC. Pt is not diabetic. PCP-Dr. Arloa, last visit 04/29/2024.   New problem(s): None   PCP is Arloa [name], MD.  Allergies  Allergen Reactions   Atorvastatin Other (See Comments)    Myalgias   Ceftin [Cefuroxime] Diarrhea   Cefuroxime Axetil Diarrhea   Clindamycin Hcl Itching   Clindamycin/Lincomycin Itching   Dapagliflozin      Other Reaction(s): numbness/tingling   Moxifloxacin Itching   Simvastatin  Other (See Comments)    myalgias   Doxycycline Hyclate Diarrhea and Rash   Penicillins Rash    Review of Systems: Negative except as noted in the HPI.   Objective:  [name] is a pleasant 89 y.o. female in NAD. AAO x 3.  Vascular Examination: Vascular status intact b/l with palpable pedal pulses. CFT immediate b/l. Pedal hair present. Trace edema b/l. No pain with calf compression b/l. Skin temperature gradient WNL b/l. No varicosities noted. No cyanosis or clubbing noted.  Neurological Examination: Sensation grossly intact b/l with 10 gram monofilament. Vibratory sensation intact b/l.  Dermatological Examination: Pedal skin with normal turgor, texture and tone b/l. No open wounds nor interdigital macerations noted. Toenails 1-5 b/l thick, discolored, elongated with subungual debris and pain on dorsal palpation. No hyperkeratotic lesions noted b/l.   Musculoskeletal Examination: Normal muscle strength 5/5 to all lower extremity muscle groups bilaterally. No pain, crepitus or joint limitation noted with ROM b/l LE. HAV with bunion bilaterally and hammertoes 2-5 b/l.. Mobility via motorized chair assistance.   Radiographs: None  Last A1c:      Latest Ref Rng & Units  08/22/2023    5:00 AM  Hemoglobin A1C  Hemoglobin-A1c 4.8 - 5.6 % 6.1    Assessment:   1. Pain due to onychomycosis of toenails of both feet    Plan:  Consent given for treatment. Patient examined. All patient's and/or POA's questions/concerns addressed on today's visit. Toenails 1-5 debrided in length and girth without incident. Continue soft, supportive shoe gear daily. Report any pedal injuries to medical professional. Call office if there are any questions/concerns. -Patient/POA to call should there be question/concern in the interim.  Return in about 3 months (around 08/05/2024).  Delon LITTIE Merlin, DPM      [place] LOCATION: [address], [place] [address]                   Office [phone]   [facility] LOCATION: [address] [place], [place] [address] Office [phone]

## 2024-05-08 ENCOUNTER — Ambulatory Visit: Admitting: Cardiology

## 2024-05-21 NOTE — Progress Notes (Unsigned)
 Cardiology Office Note:   Date:  05/21/2024  ID:  [name], DOB July 18, 1933, MRN [id number] PCP: Arloa [name], MD  [facility] Providers Cardiologist:  Lynwood Schilling, MD {  History of Present Illness:   [name] is a 89 y.o. female with a history of HFpEF, HTN, HLD, CVA, oxygen dependent COPD, who was  admitted for acute on chronic hypoxic respiratory failure 2/2 pulmonary edema, acute encephalopathy 2/2 hypoxia/hypercarbia. Course complicated by NSTEMI with Hs trop 17 >155 which cardiology was consulted. She was treated with medical management and declined LHC for further ischemic workup.  This was in Jan 2025.   ***   ***  Patient states her pulse oximetry machine kept beeping sometimes when she is sitting there quietly. She does not understand why. She felt much better overall, denied any worsening of SOB, able to walk to bathroom without getting too winded, no chest pain. She states she does not have memory issue, it's just old age. She states she has agreed with medication management for her heart disease and does not wish invasive cardiac workup. Staff RN in room witnessing our conversation. She offered no further concerns at the end of encounter. She said she may come up with more questions later.     ROS: ***  Studies Reviewed:    EKG:       ***  Risk Assessment/Calculations:   {Does this patient have ATRIAL FIBRILLATION?:[phone]} No BP recorded.  {Refresh Note OR Click here to enter BP  :1}***        Physical Exam:   VS:  There were no vitals taken for this visit.   Wt Readings from Last 3 Encounters:  05/06/24 198 lb (89.8 kg)  12/05/23 198 lb (89.8 kg)  10/22/23 196 lb 12.8 oz (89.3 kg)     GEN: Well nourished, well developed in no acute distress NECK: No JVD; No carotid bruits CARDIAC: ***RR, *** murmurs, rubs, gallops RESPIRATORY:  Clear to auscultation without rales, wheezing or rhonchi  ABDOMEN: Soft, non-tender,  non-distended EXTREMITIES:  No edema; No deformity   ASSESSMENT AND PLAN:   NSTEMI unclear if type I or type II:  ***   Admitted 08/21/2023 with acute on chronic hypoxic respiratory failure 2/2 pulmonary edema with acute encephalopathy 2/2 hypoxia/hypercapnia.  Course complicated by an elevated troponin and possible NSTEMI with troponin 17, 155.  She declined LHC for further ischemic workup and was treated with medical management. She does not wish to add any additional medication at this time (will hold off empiric DAPT) -She is not interested in further ischemic evaluation today and declines any further surgeries as needed as she is happy with her health at this time -Her symptoms have improved significantly since she has been adherent to her oxygen at home.  She is without any chest pain or exertional angina at this time. -Continue aspirin  81 mg daily, metoprolol  XL 50 mg daily, rosuvastatin  10 mg daily   Acute on chronic HFpEF:  ***   Echo 08/2023 LVEF 50-55%, possible apical/apical septal hypokinesis, grade 1 DD Euvolemic and well-compensated on exam. She feel much better now that sh ehas been adherent to constant home O2.  -She politely defers further titration/addition of GDMT -Continue Farxiga  10mg  daily, Lasix  40mg  BID, metoprolol -XL 50mg  daily    Severe carotid artery disease:  ***   Last seen by vascular on 09/2022 where there was a long discussion regarding surgery. Patient made it very clear she was not interested in  any type of surgery -Follow up with vascular as needed    Hypertension:  ***  BP today 146/68. Deferred repeat BP and notes home blood pressure averages 130s/80s. Offered to start Losartan today to support with BP control and optimize GDMT for HFpEF. She politely defers any additional medication.  Monitor BP at home and alert office for systolic BP >140 -Continue Metoprolol -XL 50mg  daily    Hyperlipidemia:  *** LDL 96 on 08/2023. Currently not at goal. She is not  interested in further titration of stain to reach goal and defers additional management  -Continue Rosuvastatin  10mg  daily          Follow up ***  Signed, Lynwood Schilling, MD

## 2024-05-22 ENCOUNTER — Encounter: Payer: Self-pay | Admitting: Cardiology

## 2024-05-22 ENCOUNTER — Ambulatory Visit: Attending: Cardiology | Admitting: Cardiology

## 2024-05-22 VITALS — BP 164/90 | HR 89 | Ht 59.0 in | Wt 199.4 lb

## 2024-05-22 DIAGNOSIS — I214 Non-ST elevation (NSTEMI) myocardial infarction: Secondary | ICD-10-CM | POA: Diagnosis not present

## 2024-05-22 DIAGNOSIS — I6523 Occlusion and stenosis of bilateral carotid arteries: Secondary | ICD-10-CM | POA: Diagnosis not present

## 2024-05-22 DIAGNOSIS — I5032 Chronic diastolic (congestive) heart failure: Secondary | ICD-10-CM | POA: Diagnosis not present

## 2024-05-22 DIAGNOSIS — I10 Essential (primary) hypertension: Secondary | ICD-10-CM

## 2024-05-22 DIAGNOSIS — E785 Hyperlipidemia, unspecified: Secondary | ICD-10-CM

## 2024-05-22 NOTE — Patient Instructions (Signed)
 Medication Instructions:  Your physician recommends that you continue on your current medications as directed. Please refer to the Current Medication list given to you today.  *If you need a refill on your cardiac medications before your next appointment, please call your pharmacy*  Lab Work: None ordered If you have labs (blood work) drawn today and your tests are completely normal, you will receive your results only by: MyChart Message (if you have MyChart) OR A paper copy in the mail If you have any lab test that is abnormal or we need to change your treatment, we will call you to review the results.  Follow-Up: At [facility], you and your health needs are our priority.  As part of our continuing mission to provide you with exceptional heart care, our providers are all part of one team.  This team includes your primary Cardiologist (physician) and Advanced Practice Providers or APPs (Physician Assistants and Nurse Practitioners) who all work together to provide you with the care you need, when you need it.  Your next appointment:   6 month(s)  Provider:   Lynwood Schilling, MD

## 2024-06-04 ENCOUNTER — Other Ambulatory Visit: Payer: Self-pay | Admitting: Primary Care

## 2024-06-04 ENCOUNTER — Other Ambulatory Visit: Payer: Self-pay | Admitting: Family Medicine

## 2024-06-04 DIAGNOSIS — Z1231 Encounter for screening mammogram for malignant neoplasm of breast: Secondary | ICD-10-CM

## 2024-06-04 DIAGNOSIS — J432 Centrilobular emphysema: Secondary | ICD-10-CM

## 2024-07-29 ENCOUNTER — Inpatient Hospital Stay: Admission: RE | Admit: 2024-07-29 | Discharge: 2024-07-29 | Attending: Family Medicine | Admitting: Family Medicine

## 2024-07-29 DIAGNOSIS — Z1231 Encounter for screening mammogram for malignant neoplasm of breast: Secondary | ICD-10-CM

## 2024-08-04 ENCOUNTER — Ambulatory Visit: Admitting: Podiatry

## 2024-09-01 ENCOUNTER — Emergency Department (HOSPITAL_COMMUNITY)

## 2024-09-01 ENCOUNTER — Other Ambulatory Visit: Payer: Self-pay

## 2024-09-01 ENCOUNTER — Observation Stay (HOSPITAL_COMMUNITY)

## 2024-09-01 ENCOUNTER — Inpatient Hospital Stay (HOSPITAL_COMMUNITY)
Admission: EM | Admit: 2024-09-01 | Discharge: 2024-09-10 | DRG: 291 | Disposition: A | Discharge status: Home Health | Attending: Internal Medicine | Admitting: Internal Medicine

## 2024-09-01 DIAGNOSIS — L03115 Cellulitis of right lower limb: Secondary | ICD-10-CM | POA: Diagnosis present

## 2024-09-01 DIAGNOSIS — Z8249 Family history of ischemic heart disease and other diseases of the circulatory system: Secondary | ICD-10-CM

## 2024-09-01 DIAGNOSIS — Z87891 Personal history of nicotine dependence: Secondary | ICD-10-CM

## 2024-09-01 DIAGNOSIS — Z7984 Long term (current) use of oral hypoglycemic drugs: Secondary | ICD-10-CM

## 2024-09-01 DIAGNOSIS — J9611 Chronic respiratory failure with hypoxia: Secondary | ICD-10-CM | POA: Diagnosis not present

## 2024-09-01 DIAGNOSIS — J9621 Acute and chronic respiratory failure with hypoxia: Secondary | ICD-10-CM | POA: Diagnosis present

## 2024-09-01 DIAGNOSIS — L03119 Cellulitis of unspecified part of limb: Secondary | ICD-10-CM

## 2024-09-01 DIAGNOSIS — L0390 Cellulitis, unspecified: Secondary | ICD-10-CM | POA: Diagnosis present

## 2024-09-01 DIAGNOSIS — Z6837 Body mass index (BMI) 37.0-37.9, adult: Secondary | ICD-10-CM | POA: Diagnosis not present

## 2024-09-01 DIAGNOSIS — Z993 Dependence on wheelchair: Secondary | ICD-10-CM

## 2024-09-01 DIAGNOSIS — Z79899 Other long term (current) drug therapy: Secondary | ICD-10-CM

## 2024-09-01 DIAGNOSIS — J449 Chronic obstructive pulmonary disease, unspecified: Secondary | ICD-10-CM | POA: Diagnosis present

## 2024-09-01 DIAGNOSIS — Z7951 Long term (current) use of inhaled steroids: Secondary | ICD-10-CM

## 2024-09-01 DIAGNOSIS — R8271 Bacteriuria: Secondary | ICD-10-CM | POA: Diagnosis present

## 2024-09-01 DIAGNOSIS — Z8673 Personal history of transient ischemic attack (TIA), and cerebral infarction without residual deficits: Secondary | ICD-10-CM | POA: Diagnosis not present

## 2024-09-01 DIAGNOSIS — E871 Hypo-osmolality and hyponatremia: Secondary | ICD-10-CM | POA: Diagnosis present

## 2024-09-01 DIAGNOSIS — I110 Hypertensive heart disease with heart failure: Principal | ICD-10-CM | POA: Diagnosis present

## 2024-09-01 DIAGNOSIS — M79661 Pain in right lower leg: Secondary | ICD-10-CM

## 2024-09-01 DIAGNOSIS — I679 Cerebrovascular disease, unspecified: Secondary | ICD-10-CM | POA: Diagnosis present

## 2024-09-01 DIAGNOSIS — L03116 Cellulitis of left lower limb: Secondary | ICD-10-CM | POA: Diagnosis present

## 2024-09-01 DIAGNOSIS — I252 Old myocardial infarction: Secondary | ICD-10-CM

## 2024-09-01 DIAGNOSIS — J9612 Chronic respiratory failure with hypercapnia: Secondary | ICD-10-CM | POA: Diagnosis present

## 2024-09-01 DIAGNOSIS — Z9981 Dependence on supplemental oxygen: Secondary | ICD-10-CM

## 2024-09-01 DIAGNOSIS — Z7982 Long term (current) use of aspirin: Secondary | ICD-10-CM

## 2024-09-01 DIAGNOSIS — I10 Essential (primary) hypertension: Secondary | ICD-10-CM | POA: Diagnosis not present

## 2024-09-01 DIAGNOSIS — J42 Unspecified chronic bronchitis: Secondary | ICD-10-CM | POA: Diagnosis not present

## 2024-09-01 DIAGNOSIS — Z6841 Body Mass Index (BMI) 40.0 and over, adult: Secondary | ICD-10-CM

## 2024-09-01 DIAGNOSIS — I5033 Acute on chronic diastolic (congestive) heart failure: Secondary | ICD-10-CM | POA: Diagnosis not present

## 2024-09-01 DIAGNOSIS — Z66 Do not resuscitate: Secondary | ICD-10-CM | POA: Diagnosis present

## 2024-09-01 DIAGNOSIS — E8729 Other acidosis: Secondary | ICD-10-CM | POA: Diagnosis present

## 2024-09-01 DIAGNOSIS — R7303 Prediabetes: Secondary | ICD-10-CM | POA: Diagnosis present

## 2024-09-01 DIAGNOSIS — J9601 Acute respiratory failure with hypoxia: Principal | ICD-10-CM

## 2024-09-01 DIAGNOSIS — M7989 Other specified soft tissue disorders: Secondary | ICD-10-CM

## 2024-09-01 DIAGNOSIS — E7800 Pure hypercholesterolemia, unspecified: Secondary | ICD-10-CM | POA: Diagnosis present

## 2024-09-01 DIAGNOSIS — I5043 Acute on chronic combined systolic (congestive) and diastolic (congestive) heart failure: Secondary | ICD-10-CM | POA: Diagnosis present

## 2024-09-01 DIAGNOSIS — I878 Other specified disorders of veins: Secondary | ICD-10-CM | POA: Diagnosis present

## 2024-09-01 LAB — CBC WITH DIFFERENTIAL/PLATELET
Abs Immature Granulocytes: 0.02 K/uL (ref 0.00–0.07)
Basophils Absolute: 0 K/uL (ref 0.0–0.1)
Basophils Relative: 0 %
Eosinophils Absolute: 0.1 K/uL (ref 0.0–0.5)
Eosinophils Relative: 1 %
HCT: 45.8 % (ref 36.0–46.0)
Hemoglobin: 14 g/dL (ref 12.0–15.0)
Immature Granulocytes: 0 %
Lymphocytes Relative: 20 %
Lymphs Abs: 1.7 K/uL (ref 0.7–4.0)
MCH: 29.9 pg (ref 26.0–34.0)
MCHC: 30.6 g/dL (ref 30.0–36.0)
MCV: 97.9 fL (ref 80.0–100.0)
Monocytes Absolute: 0.7 K/uL (ref 0.1–1.0)
Monocytes Relative: 8 %
Neutro Abs: 6.1 K/uL (ref 1.7–7.7)
Neutrophils Relative %: 71 %
Platelets: 151 K/uL (ref 150–400)
RBC: 4.68 MIL/uL (ref 3.87–5.11)
RDW: 13.9 % (ref 11.5–15.5)
WBC: 8.7 K/uL (ref 4.0–10.5)
nRBC: 0 % (ref 0.0–0.2)

## 2024-09-01 LAB — BLOOD GAS, VENOUS
Acid-Base Excess: 26.3 mmol/L — ABNORMAL HIGH (ref 0.0–2.0)
Bicarbonate: 55.1 mmol/L — ABNORMAL HIGH (ref 20.0–28.0)
O2 Saturation: 42.4 %
Patient temperature: 37
pCO2, Ven: 74 mmHg (ref 44–60)
pH, Ven: 7.48 — ABNORMAL HIGH (ref 7.25–7.43)
pO2, Ven: <31 mmHg — CL (ref 32–45)

## 2024-09-01 LAB — URINALYSIS, COMPLETE (UACMP) WITH MICROSCOPIC
Bilirubin Urine: NEGATIVE
Glucose, UA: 150 mg/dL — AB
Hgb urine dipstick: NEGATIVE
Ketones, ur: NEGATIVE mg/dL
Leukocytes,Ua: NEGATIVE
Nitrite: NEGATIVE
Protein, ur: NEGATIVE mg/dL
Specific Gravity, Urine: 1.005 (ref 1.005–1.030)
pH: 8 (ref 5.0–8.0)

## 2024-09-01 LAB — SURGICAL PCR SCREEN
MRSA, PCR: NEGATIVE
Staphylococcus aureus: NEGATIVE

## 2024-09-01 LAB — BASIC METABOLIC PANEL WITH GFR
Anion gap: 7 (ref 5–15)
BUN: 17 mg/dL (ref 8–23)
CO2: 39 mmol/L — ABNORMAL HIGH (ref 22–32)
Calcium: 9.2 mg/dL (ref 8.9–10.3)
Chloride: 95 mmol/L — ABNORMAL LOW (ref 98–111)
Creatinine, Ser: 0.82 mg/dL (ref 0.44–1.00)
GFR, Estimated: >60 mL/min
Glucose, Bld: 105 mg/dL — ABNORMAL HIGH (ref 70–99)
Potassium: 4.1 mmol/L (ref 3.5–5.1)
Sodium: 142 mmol/L (ref 135–145)

## 2024-09-01 LAB — TROPONIN T, HIGH SENSITIVITY
Troponin T High Sensitivity: 20 ng/L — ABNORMAL HIGH (ref 0–19)
Troponin T High Sensitivity: 21 ng/L — ABNORMAL HIGH (ref 0–19)

## 2024-09-01 LAB — PRO BRAIN NATRIURETIC PEPTIDE: Pro Brain Natriuretic Peptide: 3195 pg/mL — ABNORMAL HIGH

## 2024-09-01 MED ORDER — ACETAMINOPHEN 325 MG PO TABS: 650.0000 mg | ORAL_TABLET | Freq: Four times a day (QID) | ORAL | Status: DC | PRN

## 2024-09-01 MED ORDER — SODIUM CHLORIDE 0.9% FLUSH
3.0000 mL | Freq: Two times a day (BID) | INTRAVENOUS | Status: DC
  Administered 2024-09-01 – 2024-09-10 (×17): 3 mL via INTRAVENOUS

## 2024-09-01 MED ORDER — HEPARIN SODIUM (PORCINE) 5000 UNIT/ML IJ SOLN
INTRAMUSCULAR | Status: AC
  Filled 2024-09-01: qty 1

## 2024-09-01 MED ORDER — ALBUTEROL SULFATE (2.5 MG/3ML) 0.083% IN NEBU
2.5000 mg | INHALATION_SOLUTION | RESPIRATORY_TRACT | Status: DC | PRN
  Administered 2024-09-02 – 2024-09-05 (×2): 2.5 mg via RESPIRATORY_TRACT
  Filled 2024-09-01: qty 3

## 2024-09-01 MED ORDER — ONDANSETRON HCL 4 MG PO TABS: 4.0000 mg | ORAL_TABLET | Freq: Four times a day (QID) | ORAL | Status: DC | PRN

## 2024-09-01 MED ORDER — FUROSEMIDE 10 MG/ML IJ SOLN
40.0000 mg | Freq: Every day | INTRAMUSCULAR | Status: DC
  Administered 2024-09-02: 40 mg via INTRAVENOUS
  Filled 2024-09-01: qty 4

## 2024-09-01 MED ORDER — HEPARIN SODIUM (PORCINE) 5000 UNIT/ML IJ SOLN
5000.0000 [IU] | Freq: Three times a day (TID) | INTRAMUSCULAR | Status: DC
  Administered 2024-09-01 – 2024-09-10 (×27): 5000 [IU] via SUBCUTANEOUS
  Filled 2024-09-01 (×27): qty 1

## 2024-09-01 MED ORDER — SODIUM CHLORIDE 0.9 % IV SOLN: 250.0000 mL | INTRAVENOUS | Status: AC | PRN

## 2024-09-01 MED ORDER — GUAIFENESIN ER 600 MG PO TB12
ORAL_TABLET | ORAL | Status: AC
  Filled 2024-09-01: qty 1

## 2024-09-01 MED ORDER — IPRATROPIUM-ALBUTEROL 0.5-2.5 (3) MG/3ML IN SOLN
RESPIRATORY_TRACT | Status: AC
  Filled 2024-09-01: qty 3

## 2024-09-01 MED ORDER — IPRATROPIUM-ALBUTEROL 0.5-2.5 (3) MG/3ML IN SOLN
3.0000 mL | Freq: Once | RESPIRATORY_TRACT | Status: AC
  Administered 2024-09-01: 3 mL via RESPIRATORY_TRACT
  Filled 2024-09-01: qty 3

## 2024-09-01 MED ORDER — ACETAMINOPHEN 650 MG RE SUPP: 650.0000 mg | Freq: Four times a day (QID) | RECTAL | Status: DC | PRN

## 2024-09-01 MED ORDER — ASPIRIN 81 MG PO TBEC
81.0000 mg | DELAYED_RELEASE_TABLET | Freq: Every day | ORAL | Status: DC
  Administered 2024-09-01 – 2024-09-10 (×10): 81 mg via ORAL
  Filled 2024-09-01 (×9): qty 1

## 2024-09-01 MED ORDER — ASPIRIN 81 MG PO TBEC
DELAYED_RELEASE_TABLET | ORAL | Status: AC
  Filled 2024-09-01: qty 1

## 2024-09-01 MED ORDER — FUROSEMIDE 10 MG/ML IJ SOLN
40.0000 mg | Freq: Once | INTRAMUSCULAR | Status: AC
  Administered 2024-09-01: 40 mg via INTRAVENOUS
  Filled 2024-09-01: qty 4

## 2024-09-01 MED ORDER — IPRATROPIUM-ALBUTEROL 0.5-2.5 (3) MG/3ML IN SOLN
3.0000 mL | Freq: Three times a day (TID) | RESPIRATORY_TRACT | Status: DC
  Administered 2024-09-02 – 2024-09-10 (×23): 3 mL via RESPIRATORY_TRACT
  Filled 2024-09-01 (×25): qty 3

## 2024-09-01 MED ORDER — SODIUM CHLORIDE 0.9% FLUSH: 3.0000 mL | INTRAVENOUS | Status: DC | PRN

## 2024-09-01 MED ORDER — GUAIFENESIN ER 600 MG PO TB12
600.0000 mg | ORAL_TABLET | Freq: Two times a day (BID) | ORAL | Status: DC
  Administered 2024-09-01 – 2024-09-10 (×18): 600 mg via ORAL
  Filled 2024-09-01 (×17): qty 1

## 2024-09-01 MED ORDER — LINEZOLID 600 MG/300ML IV SOLN
600.0000 mg | Freq: Two times a day (BID) | INTRAVENOUS | Status: DC
  Administered 2024-09-01: 600 mg via INTRAVENOUS
  Filled 2024-09-01 (×2): qty 300

## 2024-09-01 MED ORDER — FENTANYL CITRATE (PF) 50 MCG/ML IJ SOSY: 12.5000 ug | PREFILLED_SYRINGE | INTRAMUSCULAR | Status: DC | PRN

## 2024-09-01 MED ORDER — ONDANSETRON HCL 4 MG/2ML IJ SOLN: 4.0000 mg | Freq: Four times a day (QID) | INTRAMUSCULAR | Status: DC | PRN

## 2024-09-01 MED ORDER — ROSUVASTATIN CALCIUM 20 MG PO TABS
40.0000 mg | ORAL_TABLET | Freq: Every day | ORAL | Status: DC
  Administered 2024-09-01 – 2024-09-10 (×10): 40 mg via ORAL
  Filled 2024-09-01 (×10): qty 2

## 2024-09-01 NOTE — Assessment & Plan Note (Signed)
 -  Continue [place]

## 2024-09-01 NOTE — Subjective & Objective (Signed)
 Patient had a fall today, she felt generally weak, has been more SOB Wears O2 at night  Has been having occasional CP Has known hx of COPD and CHF Has been having leg swelling and redness her PCP started her on Amoxicillin. She also reports her neighbor scratched her leg when trying to help her put on sock   ECG had some t wave inversions trop 20 Placed on 2L O2 for comfort  CXR showing Cardiomegaly and atelectasis

## 2024-09-01 NOTE — ED Notes (Signed)
 Bedside commode placed and patient assisted.  Urine specimen obtained and sent to lab.  Patient has ice chips at bedside as requested.

## 2024-09-01 NOTE — ED Notes (Signed)
 While in room patient would take off her oxygen because she doesn't think she needs the extra oxygen. She is stuck on the fact that she is to be on 2L not 4L despite this writer trying to explain to her in different ways the importance and severity of her oxygen status.

## 2024-09-01 NOTE — ED Notes (Addendum)
 When this writer was giving patient her night medications, I explained to her that due to breathing would need to place her on BIPAP due to retaining to much CO2 in her system. Patient stated absolutely not, that's supposed to be in my chart, I get claustrophobic. I informed the admitting provider of same. Patient stated that she did not want that and that she wondering how much oxygen she was on now. I told patient she was on 4L [place] and she stated Turn that down, I'm on 2 at home, how did it get to 4? Going from 2 to 4 is a big jump, that's why my oxygen is off on the blood test. I informed patient that I was unable to change her oxygen because her oxygen would drop and its already dropping to [id number] at times on that 4L and dropping it any lower could create more problems especially with her CO2 level where it is right now. Patient seemed to not understand the importance of getting the CO2 off and keeping her oxygen where it is due to patients sickness right now. MD is going to come talk to patient about BiPAP and explain to her to situation.

## 2024-09-01 NOTE — H&P (Addendum)
 "    [name] FMW:[id number] DOB: Sep 06, 1932 DOA: 09/01/2024     PCP: Arloa [name], MD   Outpatient Specialists:   CARDS:   Dr. Lynwood Schilling, MD    Patient arrived to ER on 09/01/24 at 1330 Referred by Attending Silvester Ales, MD   Patient coming from:    home Lives alone,   Chief Complaint:   Chief Complaint  Patient presents with   Leg Injury    HPI: [name] is a 89 y.o. female with medical history significant of   diastolic CHF, HTN, HLD, hx of CVA, COPD chronic hypoxia on oxygen at night, NSTEMI January 2025    Presented with fall and feeling terrible Patient had a fall today, she felt generally weak, has been more SOB Wears O2 at night  Has been having occasional CP Has known hx of COPD and CHF Has been having leg swelling and redness her PCP started her on Amoxicillin. She also reports her neighbor scratched her leg when trying to help her put on sock   ECG had some t wave inversions trop 20 Placed on 2L O2 for comfort  CXR showing Cardiomegaly and atelectasis   When questioned what she meant by feeling terrible patient is a bit unsure but eventually did and said that she has been having more frequent urination at her baseline.  She has been feeling somewhat short of breath but states that she always gets short of breath when she talks and when she gets anxious.  She reports occasional chest pain/back pain that wraps around and goes under her left breast that comes and goes and currently she is chest pain-free.  She usually gets around in wheelchair States that she slept using oxygen and in the morning she tried to get up off her recliner and fell on her bottom she did not hit her head Because she generally did not feel well she did call 911  She states that she has not had any fevers or chills no coughing  She does confirm that few days ago her friend by accident scratched her right leg but her legs has been swollen for quite some time except  that now that she has some redness that is more significant on her right leg although she also has a spot on her left leg as well that looks more swollen and red  States she has been compliant with her Lasix   She states that she occasionally feels wheezy she uses nebulizer and that helps bring up mucus  Of note patient has been admitted in the past for NSTEMI back in January 2025 and at that time she declined over aggressive interventions  Denies significant ETOH intake   Does not smoke     Regarding pertinent Chronic problems:     Hyperlipidemia - on statins Crestor  Lipid Panel     Component Value Date/Time   CHOL 161 08/22/2023 1845   CHOL 165 08/31/2021 1007   TRIG 46 08/22/2023 1845   HDL 56 08/22/2023 1845   HDL 53 08/31/2021 1007   CHOLHDL 2.9 08/22/2023 1845   VLDL 9 08/22/2023 1845   LDLCALC 96 08/22/2023 1845   LDLCALC 93 08/31/2021 1007   LABVLDL 19 08/31/2021 1007     HTN on Toprol  and Lasix    chronic CHF diastolic/systolic/ combined - last echo  Recent Results (from the past 56199 hours)  ECHOCARDIOGRAM COMPLETE   Collection Time: 08/22/23 12:45 PM  Result Value   Weight 3,200  Height 59   BP 110/95   S' Lateral 3.70   Area-P 1/2 4.41   Est EF 50 - 55%   Narrative      ECHOCARDIOGRAM REPORT      IMPRESSIONS    1. Left ventricular ejection fraction, by estimation, is 50 to 55%. Left ventricular ejection fraction by PLAX is 55 %. The left ventricle has low normal function. The left ventricle has no regional wall motion abnormalities. Left ventricular diastolic  parameters are consistent with Grade I diastolic dysfunction (impaired relaxation). Elevated left ventricular end-diastolic pressure. Possible apical/apical septal hypokinesis.  2. Right ventricular systolic function is normal. The right ventricular size is normal. Tricuspid regurgitation signal is inadequate for assessing PA pressure.  3. The mitral valve is abnormal. Trivial mitral valve  regurgitation. Moderate to severe mitral annular calcification.  4. The aortic valve was not well visualized. Aortic valve regurgitation is not visualized.  5. The inferior vena cava is normal in size with <50% respiratory variability, suggesting right atrial pressure of 8 mmHg.            Morbid obesity-   BMI Readings from Last 1 Encounters:  05/22/24 40.27 kg/m       COPD -   on baseline oxygen  2L, while sleeping and with exertion     Hx of CVA -  with/out residual deficits on Aspirin  81 mg,      While in ER:     Troponin stable BNP somewhat elevated chest x-ray showing atelectasis patient was placed on 2 L ABG pending at this time Lower extremity show evidence of will areas of erythema    Lab Orders         Urine Culture (for pregnant, neutropenic or urologic patients or patients with an indwelling urinary catheter)         MRSA Next Gen by PCR, Nasal         CBC with Differential         CBC with Differential/Platelet         Basic metabolic panel with GFR         Pro Brain natriuretic peptide         Blood gas, venous         Hepatic function panel         Urinalysis, Complete w Microscopic -Urine, Clean Catch       CXR - Borderline cardiomegaly. Probable atelectasis left lung base.     Following Medications were ordered in ER: Medications  ipratropium-albuterol  (DUONEB) 0.5-2.5 (3) MG/3ML nebulizer solution 3 mL (3 mLs Nebulization Given 09/01/24 1452)  furosemide  (LASIX ) injection 40 mg (40 mg Intravenous Given 09/01/24 1859)  ipratropium-albuterol  (DUONEB) 0.5-2.5 (3) MG/3ML nebulizer solution 3 mL (3 mLs Nebulization Given 09/01/24 1849)       ED Triage Vitals  Encounter Vitals Group     BP 09/01/24 1342 (!) 173/81     Girls Systolic BP Percentile --      Girls Diastolic BP Percentile --      Boys Systolic BP Percentile --      Boys Diastolic BP Percentile --      Pulse Rate 09/01/24 1342 77     Resp 09/01/24 1342 20     Temp 09/01/24 1342 98.5 F  (36.9 C)     Temp src --      SpO2 09/01/24 1342 100 %     Weight --      Height --  Head Circumference --      Peak Flow --      Pain Score 09/01/24 1350 0     Pain Loc --      Pain Education --      Exclude from Growth Chart --   UFJK(75)@     _________________________________________ Significant initial  Findings: Abnormal Labs Reviewed  BASIC METABOLIC PANEL WITH GFR - Abnormal; Notable for the following components:      Result Value   Chloride 95 (*)    CO2 39 (*)    Glucose, Bld 105 (*)    All other components within normal limits  PRO BRAIN NATRIURETIC PEPTIDE - Abnormal; Notable for the following components:   Pro Brain Natriuretic Peptide 3,195.0 (*)    All other components within normal limits  TROPONIN T, HIGH SENSITIVITY - Abnormal; Notable for the following components:   Troponin T High Sensitivity 20 (*)    All other components within normal limits  TROPONIN T, HIGH SENSITIVITY - Abnormal; Notable for the following components:   Troponin T High Sensitivity 21 (*)    All other components within normal limits      _________________________ Troponin 20- 21     ECG: Ordered Personally reviewed and interpreted by me showing: HR : 81 Rhythm: Borderline cardiomegaly. Probable atelectasis left lung base.  QTC 406  proBNP  >3000    The recent clinical data is shown below. Vitals:   09/01/24 1342 09/01/24 1351 09/01/24 1647  BP: (!) 173/81  (!) 146/77  Pulse: 77  83  Resp: 20  20  Temp: 98.5 F (36.9 C)  98.3 F (36.8 C)  SpO2: 100% 99% 92%    WBC     Component Value Date/Time   WBC 8.7 09/01/2024 1701   LYMPHSABS 1.7 09/01/2024 1701   MONOABS 0.7 09/01/2024 1701   EOSABS 0.1 09/01/2024 1701   BASOSABS 0.0 09/01/2024 1701        UA  ordered    VBG pending   __________________________________________________________ Recent Labs  Lab 09/01/24 1702  NA 142  K 4.1  CO2 39*  GLUCOSE 105*  BUN 17  CREATININE 0.82  CALCIUM  9.2     Cr stable,    Lab Results  Component Value Date   CREATININE 0.82 09/01/2024   CREATININE 0.84 09/13/2023   CREATININE 1.03 (H) 08/29/2023    Lab Results  Component Value Date   CALCIUM  9.2 09/01/2024   PHOS 5.4 (H) 08/29/2023    Plt: Lab Results  Component Value Date   PLT 151 09/01/2024      Recent Labs  Lab 09/01/24 1701  WBC 8.7  NEUTROABS 6.1  HGB 14.0  HCT 45.8  MCV 97.9  PLT 151    HG/HCT   stable     Component Value Date/Time   HGB 14.0 09/01/2024 1701   HCT 45.8 09/01/2024 1701   MCV 97.9 09/01/2024 1701    _______________________________________________ Hospitalist was called for admission for possible CHF exacerbation versus mild cellulitis  The following Work up has been ordered so far:  Orders Placed This Encounter  Procedures   Urine Culture (for pregnant, neutropenic or urologic patients or patients with an indwelling urinary catheter)   MRSA Next Gen by PCR, Nasal   DG Chest 2 View   DG Tibia/Fibula Left   DG Tibia/Fibula Right   CBC with Differential   CBC with Differential/Platelet   Basic metabolic panel with GFR   Pro Brain natriuretic peptide   Blood  gas, venous   Hepatic function panel   Urinalysis, Complete w Microscopic -Urine, Clean Catch   Diet heart healthy/carb modified Room service appropriate? Yes; Fluid consistency: Thin   ED Cardiac monitoring   Cardiac Monitoring - Continuous Indefinite   Do not attempt resuscitation (DNR)- Limited -Do Not Intubate (DNI)   Consult to hospitalist   Consult to Transition of Care Team   Consult to Heart Failure Navigation Team ([facility], [facility], and [facility])   ED EKG   Saline lock IV   Place in observation (patient's expected length of stay will be less than 2 midnights)   VAS US  LOWER EXTREMITY VENOUS (DVT) (7a-5p)     OTHER Significant initial  Findings:  labs showing:     DM  labs:  HbA1C: No results for input(s): HGBA1C in the last 8760 hours.     CBG (last 3)  No results for  input(s): GLUCAP in the last 72 hours.        Cultures: No results found for: SDES, SPECREQUEST, CULT, REPTSTATUS   Radiological Exams on Admission: VAS US  LOWER EXTREMITY VENOUS (DVT) (7a-5p) Result Date: 09/01/2024  Lower Venous DVT Study Patient Name:  Delynn M Degraff  Date of Exam:   09/01/2024 Medical Rec #: [id number]      Accession #:    [id number] Date of Birth: 04-07-1933      Patient Gender: F Patient Age:   48 years Exam Location:  [facility] Procedure:      VAS US  LOWER EXTREMITY VENOUS (DVT) Referring Phys: JOSHUA LONG --------------------------------------------------------------------------------  Indications: Pain, Erythema, and Swelling. Other Indications: Cellulitis. Limitations: Poor ultrasound/tissue interface. Comparison Study: Previous exam on 08/22/2023 was negative for DVT Performing Technologist: Ezzie Potters RVT, RDMS  Examination Guidelines: A complete evaluation includes B-mode imaging, spectral Doppler, color Doppler, and power Doppler as needed of all accessible portions of each vessel. Bilateral testing is considered an integral part of a complete examination. Limited examinations for reoccurring indications may be performed as noted. The reflux portion of the exam is performed with the patient in reverse Trendelenburg.  +---------+---------------+---------+-----------+----------+--------------+ RIGHT    CompressibilityPhasicitySpontaneityPropertiesThrombus Aging +---------+---------------+---------+-----------+----------+--------------+ CFV      Full                    Yes                                 +---------+---------------+---------+-----------+----------+--------------+ SFJ      Full                                                        +---------+---------------+---------+-----------+----------+--------------+ FV Prox  Full                    Yes                                  +---------+---------------+---------+-----------+----------+--------------+ FV Mid   Full                    Yes                                 +---------+---------------+---------+-----------+----------+--------------+  FV DistalFull                    Yes                                 +---------+---------------+---------+-----------+----------+--------------+ PFV      Full                                                        +---------+---------------+---------+-----------+----------+--------------+ POP      Full                    Yes                                 +---------+---------------+---------+-----------+----------+--------------+ PTV      Full                                                        +---------+---------------+---------+-----------+----------+--------------+ PERO     Full                                                        +---------+---------------+---------+-----------+----------+--------------+   +----+---------------+---------+-----------+----------+--------------+ LEFTCompressibilityPhasicitySpontaneityPropertiesThrombus Aging +----+---------------+---------+-----------+----------+--------------+ CFV Full           Yes      Yes                                 +----+---------------+---------+-----------+----------+--------------+    Summary: RIGHT: - There is no evidence of deep vein thrombosis in the lower extremity.  - No cystic structure found in the popliteal fossa.  LEFT: - No evidence of common femoral vein obstruction.   *See table(s) above for measurements and observations. Electronically signed by Gaile New MD on 09/01/2024 at 5:42:28 PM.    Final    DG Chest 2 View Result Date: 09/01/2024 CLINICAL DATA:  Shortness of breath EXAM: CHEST - 2 VIEW COMPARISON:  09/09/2023 FINDINGS: Borderline cardiac enlargement. Aortic atherosclerosis. Probable atelectasis left lung base. No sizable pleural effusion  IMPRESSION: Borderline cardiomegaly. Probable atelectasis left lung base. Electronically Signed   By: Luke Bun M.D.   On: 09/01/2024 15:50   _______________________________________________________________________________________________________ Latest  Blood pressure (!) 146/77, pulse 83, temperature 98.3 F (36.8 C), resp. rate 20, SpO2 92%.   Vitals  labs and radiology finding personally reviewed  Review of Systems:    Pertinent positives include:  fatigue, dysuria shortness of breath at rest.  dyspnea on exertion,  Constitutional:  No weight loss, night sweats, Fevers, chills,  weight loss  HEENT:  No headaches, Difficulty swallowing,Tooth/dental problems,Sore throat,  No sneezing, itching, ear ache, nasal congestion, post nasal drip,  Cardio-vascular:  No chest pain, Orthopnea, PND, anasarca, dizziness, palpitations.no Bilateral lower extremity swelling  GI:  No heartburn, indigestion, abdominal pain, nausea, vomiting, diarrhea, change in bowel  habits, loss of appetite, melena, blood in stool, hematemesis Resp:  no No excess mucus, no productive cough, No non-productive cough, No coughing up of blood.No change in color of mucus.No wheezing. Skin:  no rash or lesions. No jaundice GU:   change in color of urine, no urgency or frequency. No straining to urinate.  No flank pain.  Musculoskeletal:  No joint pain or no joint swelling. No decreased range of motion. No back pain.  Psych:  No change in mood or affect. No depression or anxiety. No memory loss.  Neuro: no localizing neurological complaints, no tingling, no weakness, no double vision, no gait abnormality, no slurred speech, no confusion  All systems reviewed and apart from HOPI all are negative _______________________________________________________________________________________________ Past Medical History:   Past Medical History:  Diagnosis Date   Acute back pain with sciatica, unspecified laterality     Allergic rhinitis    BMI 40.0-44.9, adult (HCC)    Carotid artery disease 05/11/2022   Carotid US  02/2022: Bilateral ICA 40-59   Cerebrovascular disease    Chronic diastolic (congestive) heart failure (HCC)    Colon polyp    COPD (chronic obstructive pulmonary disease) (HCC)    MILD   Disorder of bone    Estrogen deficiency    Excessive sleepiness    Hyperlipidemia    Hypertension    Incontinence of urine    Irregular heartbeat    Low back pain    Morbid obesity (HCC)    OA (osteoarthritis)    OF THE KNEES   OAB (overactive bladder)    Osteopenia    Osteopenia    Primary osteoarthritis of both knees    Pure hypercholesterolemia    Shingles    Shingles    Stroke (HCC)    Thyroid  cyst    Thyroid  cyst    Ulnar neuropathy of both upper extremities    Vertigo       Past Surgical History:  Procedure Laterality Date   BREAST EXCISIONAL BIOPSY Right    benign    Social History:  Ambulatory   walker  wheelchair bound,      reports that she has quit smoking. Her smoking use included cigarettes. She has never used smokeless tobacco. She reports that she does not currently use alcohol. She reports that she does not use drugs.   Family History:   Family History  Problem Relation Age of Onset   CAD Father    Breast cancer Neg Hx    ______________________________________________________________________________________________ Allergies: Allergies[1]   Prior to Admission medications  Medication Sig Start Date End Date Taking? Authorizing Provider  albuterol  (PROVENTIL ) (2.5 MG/3ML) 0.083% nebulizer solution Take 3 mLs (2.5 mg total) by nebulization every 4 (four) hours as needed for wheezing or shortness of breath. 05/02/23   Hope Almarie ORN, NP  albuterol  (VENTOLIN  HFA) 108 (90 Base) MCG/ACT inhaler Inhale 2 puffs into the lungs every 6 (six) hours as needed for wheezing or shortness of breath. 12/05/23   Kara Dorn NOVAK, MD  aspirin  EC 81 MG EC tablet Take 1 tablet  (81 mg total) by mouth daily. Swallow whole. 10/27/20   Ghimire, Donalda HERO, MD  budesonide  (PULMICORT ) 0.25 MG/2ML nebulizer solution USE 1 VIAL VIA NEBULIZER IN THE MORNING AND AT BEDTIME 06/04/24   Kara Dorn NOVAK, MD  Cholecalciferol  (VITAMIN D  PO) Take 1,000 Units by mouth daily.    [provider]  dapagliflozin  propanediol (FARXIGA ) 10 MG TABS tablet Take 1 tablet (10 mg total) by mouth  daily. 08/30/23   Singh, Prashant K, MD  docusate sodium  (COLACE) 100 MG capsule Take 100 mg by mouth daily.    [provider]  furosemide  (LASIX ) 40 MG tablet Take 1 tablet (40 mg total) by mouth 2 (two) times daily. 08/30/23 08/29/24  Singh, Prashant K, MD  Glycerin-Polysorbate 80 (REFRESH DRY EYE THERAPY OP) Apply 2 drops to eye as needed (dry eyes).    [provider]  ipratropium-albuterol  (DUONEB) 0.5-2.5 (3) MG/3ML SOLN Inhale 3 mLs into the lungs 3 (three) times daily. 12/06/23   Kara Dorn NOVAK, MD  metoprolol  succinate (TOPROL -XL) 50 MG 24 hr tablet Take 1 tablet (50 mg total) by mouth daily. Take with or immediately following a meal. 08/30/23   Singh, Prashant K, MD  OXYGEN Inhale into the lungs as directed. 2L    [provider]  rosuvastatin  (CRESTOR ) 10 MG tablet Take 1 tablet (10 mg total) by mouth daily. Patient taking differently: Take 40 mg by mouth daily. 08/30/23   Dennise Lavada POUR, MD    ___________________________________________________________________________________________________ Physical Exam:    09/01/2024    4:47 PM 09/01/2024    1:42 PM 05/22/2024   10:39 AM  Vitals with BMI  Height   4' 11  Weight   199 lbs 6 oz  BMI   40.25  Systolic 146 173 835  Diastolic 77 81 90  Pulse 83 77 89     1. General:  in No  Acute distress    Chronically ill  -appearing 2. Psychological: Alert and   Oriented 3. Head/ENT:   Moist   Mucous Membranes                          Head Non traumatic, neck supple                           Poor  Dentition 4. SKIN: normal   Skin turgor,  Skin clean Dry areas of redness in tibial areas bilaterally 5. Heart: Regular rate and rhythm no  Murmur, no Rub or gallop 6. Lungs:   no wheezes some crackles   7. Abdomen: Soft, suprapubic-tender,   distended   obese  bowel sounds present 8. Lower extremities: no clubbing, cyanosis,2+ edema 9. Neurologically Grossly intact, moving all 4 extremities equally   10. MSK: Normal range of motion    Chart has been reviewed  ______________________________________________________________________________________________  Assessment/Plan 89 y.o. female with medical history significant of   diastolic CHF, HTN, HLD, hx of CVA, COPD chronic hypoxia on oxygen at night, NSTEMI January 2025    Admitted for cellulitis, acute on chronic systolic diastolic CHF, celluliits   Present on Admission:  Acute on chronic diastolic CHF (congestive heart failure) (HCC)  Chronic respiratory failure with hypoxia and hypercapnia (HCC)  COPD (chronic obstructive pulmonary disease) (HCC)  Essential hypertension  Cellulitis     Acute on chronic diastolic CHF (congestive heart failure) (HCC) - Pt diagnosed with CHF based on presence of the following , rales on exam  bilateral leg edema, DOE, With noted response to IV diuretic in ER  admit on telemetry,  cycle cardiac enzymes, TROP 20 - 21 obtain serial ECG  to evaluate for ischemia as a cause of heart failure  monitor daily weight: There were no vitals filed for this visit. Last BNP proBNP 3,195.0 High     diurese with IV lasix    40 mg IV  Daily  and monitor orthostatics and creatinine to avoid over diuresis.  Order echogram to evaluate EF and valves      Body mass index (BMI) 37.0-37.9, adult Contributing to comorbidity and complicating medical management     Nutritional follow up as an out pt would be recommended   Chronic respiratory failure with hypoxia and hypercapnia (HCC) Chronic provide O2 as  needed VBG showing evidence of chronic compensated hypercapnia,  Patient would benefit from BiPAP nightly Discussed with patient who have refused BiPAP at this time stating that she feels very claustrophobic  COPD (chronic obstructive pulmonary disease) (HCC) Continue home nebulizer treatments albuterol  as needed currently does not seem to be contributing  Essential hypertension Continue Toprol   History of stroke Continue statin and aspirin   Cellulitis Patient has evidence of venous stasis with areas that are more consistent with cellulitis changes for tonight [place] and linezolid  Obtain plain images MRSA PCR testing    Other plan as per orders.  DVT prophylaxis:  hep [place]   Code Status:    Code Status: Limited: Do not attempt resuscitation (DNR) -DNR-LIMITED -Do Not Intubate/DNI  DNR/DNI as per patient   I had personally discussed CODE STATUS with patient  ACP   none    Family Communication:   Family not at  Bedside    Diet  Diet Orders (From admission, onward)     Start     Ordered   09/01/24 1841  Diet heart healthy/carb modified Room service appropriate? Yes; Fluid consistency: Thin  Diet effective now       Question Answer Comment  Diet-HS Snack? Nothing   Room service appropriate? Yes   Fluid consistency: Thin      09/01/24 1841               To home once workup is complete and patient is stable   Following barriers for discharge:                                                        Work of breathing improves       Consult Orders  (From admission, onward)           Start     Ordered   09/01/24 1842  Consult to hospitalist  Once       Provider:  (Not yet assigned)  Question Answer Comment  Place call to: [facility]   Reason for Consult Admit      09/01/24 1841                               Consults called:    NONE   Admission status:  ED Disposition     ED Disposition  Admit   Condition  --   Comment  Hospital  Area: [facility] [[id number]]  Level of Care: Progressive [102]  Admit to Progressive based on following criteria: RESPIRATORY PROBLEMS hypoxemic/hypercapnic respiratory failure that is responsive to NIPPV (BiPAP) or High Flow Nasal Cannula (6-80 lpm). Frequent assessment/intervention, no > Q2 hrs < Q4 hrs, to maintain oxygenation and pulmonary hygiene.  May place patient in observation at [facility] or [facility] if equivalent level of care is available:: No  Diagnosis: Acute on chronic diastolic CHF (congestive heart failure) (HCC) [  250753]  Admitting Physician: Delron Comer [[id number]]  Attending Physician: Omero Kowal [[id number]]           Obs     Level of care       progressive        Blease Quiver 09/01/2024, 8:31 PM    [facility]     after 2 AM please page floor coverage   If 7AM-7PM, please contact the day team taking care of the patient using [email or web address]        [1]  Allergies Allergen Reactions   Atorvastatin Other (See Comments)    Myalgias   Ceftin [Cefuroxime] Diarrhea   Cefuroxime Axetil Diarrhea   Clindamycin Hcl Itching   Clindamycin/Lincomycin Itching   Dapagliflozin      Other Reaction(s): numbness/tingling   Moxifloxacin Itching   Simvastatin  Other (See Comments)    myalgias   Doxycycline Hyclate Diarrhea and Rash   Penicillins Rash   "

## 2024-09-01 NOTE — ED Notes (Signed)
 Patient pulled RN IV out and is now requesting IV team.  Labs will be delayed until access is obtained, refuses RN stick or straight stick for labs.

## 2024-09-01 NOTE — ED Notes (Signed)
 Placed patient on purwick. Patient has increased work of breathing on exertion. For health of patient informed her would place her on purwick for safety. Patient agreed and was coached to take some deep breaths to help control breathing after getting back into the bed.

## 2024-09-01 NOTE — Progress Notes (Signed)
 RLE venous duplex has been completed.  Preliminary results given to Dr. Armenta.   Results can be found under chart review under CV PROC. 09/01/2024 4:46 PM [name] RVT, RDMS

## 2024-09-01 NOTE — Progress Notes (Signed)
 IV team consulted for labs. Labs obtained from another provider. RN messaged to re-consult should pt need medications or scans.

## 2024-09-01 NOTE — Assessment & Plan Note (Addendum)
 Chronic provide O2 as needed VBG showing evidence of chronic compensated hypercapnia,  Patient would benefit from BiPAP nightly Discussed with patient who have refused BiPAP at this time stating that she feels very claustrophobic

## 2024-09-01 NOTE — Assessment & Plan Note (Addendum)
 Patient has evidence of venous stasis with areas that are more consistent with cellulitis changes for tonight [place] and linezolid  Obtain plain images MRSA PCR testing

## 2024-09-01 NOTE — Assessment & Plan Note (Signed)
 Continue home nebulizer treatments albuterol  as needed currently does not seem to be contributing

## 2024-09-01 NOTE — ED Notes (Signed)
 Pt got USIV placed and medication administered.

## 2024-09-01 NOTE — Assessment & Plan Note (Signed)
"   Continue statin and aspirin.  "

## 2024-09-01 NOTE — ED Provider Notes (Signed)
 DVT study. Labs F/U. Anticipate home if w/u negative Physical Exam  BP (!) 173/81   Pulse 77   Temp 98.5 F (36.9 C)   Resp 20   SpO2 99%   Physical Exam  Procedures  Procedures  ED Course / MDM    Medical Decision Making Amount and/or Complexity of Data Reviewed Labs: ordered. Radiology: ordered.  Risk Prescription drug management.  DVT study negative.  EKG reviewed by myself has T wave inversions anterior and laterally that are suggestive of ischemia as compared to first prior EKG from 1\31\25.  No STEMI present.  Troponin 20, BNP 3195  Chest x-ray interpreted by radiology borderline Cardiomegaly likely left atelectasis.  Upon recheck of the patient, she does have mild increased work of breathing at rest.  She is currently on 2 L of oxygen oxygen saturations at 95%.  Patient reports that she uses oxygen at home sometimes.  She reports she typically uses it at night and she has instructions to use it with exertion but is not typically wearing it all the time.  Heart is regular.  Lungs have some expiratory wheeze and soft breath sounds at the bases with occasional crackle.  Patient has 2+ bilateral lower extremity edema.  She has significant erythema and warmth which is symmetric.  Likely secondary to edema.  At this time, I feel the patient is experiencing CHF exacerbation.  BNP is elevated she is objectively short of breath and peers to be mildly hypoxic relative to baseline.  Will administer Lasix  and continue DuoNebs for additional history of COPD.  Patient needs admission for respiratory symptoms and what she describes as unsteadiness and weakness that is atypical for her.  She reports at baseline she is functioning independently.  Cognitive function is very good.          Armenta Canning, MD 09/01/24 1840

## 2024-09-01 NOTE — ED Triage Notes (Signed)
 Patient BIB by EMS patient has been treated for cellulitis and is on abx second round of amoxicillin.  Neighbor scratched leg with a ring while assisting with sock  and she has a bruise from that and this is what patient says she is here for.  Patient did slide out of a recliner but denies injury and due to being upset she got anxious and short of breath but says that is fine.  She is on 2L oxygen at night and they put her oxygen on the scene.  O2 sat is 99 on RA.

## 2024-09-01 NOTE — ED Notes (Signed)
 Received no response from IV team after request was made at 1657.    We have an RN that will do the ultrasound when he gets time therefore the troponin will be delayed through no fault of this RN.  This patient has had 4 attempts to start a line and the one that was obtained she accidentally pulled out during toileting.  ER Charge RN is aware.

## 2024-09-01 NOTE — ED Notes (Signed)
 Admitting doctor is in room now talking with patient.

## 2024-09-01 NOTE — ED Provider Notes (Signed)
 "  Emergency Department Provider Note   I have reviewed the triage vital signs and the nursing notes.   HISTORY  Chief Complaint Leg Injury   HPI [name] is a 89 y.o. female with past history of COPD on nighttime oxygen, hypertension, hyperlipidemia, diastolic congestive heart failure with peripheral edema presents to the emergency department for evaluation of worsening right leg redness and swelling.  No fever.  She has been on amoxicillin from her PCP with some improvement but states her home nurse was putting on her socks and scraped her leg with her ring.  Some of the redness has increased.  She called her PCP who restarted her amoxicillin but has had some increased leg redness.  Today, she felt some fatigue and fell to the ground from her recliner landing on her butt.  No severe pain but ultimately called EMS.  She reported to them some intermittent shortness of breath without chest pain.  No daytime O2 requirement at baseline.   Past Medical History:  Diagnosis Date   Acute back pain with sciatica, unspecified laterality    Allergic rhinitis    BMI 40.0-44.9, adult (HCC)    Carotid artery disease 05/11/2022   Carotid US  02/2022: Bilateral ICA 40-59   Cerebrovascular disease    Chronic diastolic (congestive) heart failure (HCC)    Colon polyp    COPD (chronic obstructive pulmonary disease) (HCC)    MILD   Disorder of bone    Estrogen deficiency    Excessive sleepiness    Hyperlipidemia    Hypertension    Incontinence of urine    Irregular heartbeat    Low back pain    Morbid obesity (HCC)    OA (osteoarthritis)    OF THE KNEES   OAB (overactive bladder)    Osteopenia    Osteopenia    Primary osteoarthritis of both knees    Pure hypercholesterolemia    Shingles    Shingles    Stroke (HCC)    Thyroid  cyst    Thyroid  cyst    Ulnar neuropathy of both upper extremities    Vertigo     Review of Systems  Constitutional: No fever/chills Cardiovascular: Denies  chest pain. Respiratory: Intermittent shortness of breath. Gastrointestinal: No abdominal pain.  No nausea, no vomiting.   Musculoskeletal: Positive leg redness.  Skin: Leg redness.  Neurological: Negative for headaches.   ____________________________________________   PHYSICAL EXAM:  VITAL SIGNS: ED Triage Vitals  Encounter Vitals Group     BP 09/01/24 1342 (!) 173/81     Pulse Rate 09/01/24 1342 77     Resp 09/01/24 1342 20     Temp 09/01/24 1342 98.5 F (36.9 C)     Temp src --      SpO2 09/01/24 1342 100 %   Constitutional: Alert and oriented. Well appearing and in no acute distress. Eyes: Conjunctivae are normal. Head: Atraumatic. Nose: No congestion/rhinnorhea. Mouth/Throat: Mucous membranes are moist.   Neck: No stridor.  Cardiovascular: Normal rate, regular rhythm. Good peripheral circulation. Grossly normal heart sounds.   Respiratory: Normal respiratory effort.  No retractions. Lungs CTAB. Gastrointestinal: Soft and nontender. No distention.  Musculoskeletal: Bilateral lower extremity edema slightly worse on the right.  Erythema slightly worse on the right as well but not warm to touch.  Compartments are soft.  Intact pedal pulses.  Neurologic:  Normal speech and language. No gross focal neurologic deficits are appreciated.  Skin:  Skin is warm, dry.  Mild erythema bilaterally  but slightly worse on the right.         ____________________________________________   LABS (all labs ordered are listed, but only abnormal results are displayed)  Labs Reviewed  PRO BRAIN NATRIURETIC PEPTIDE  CBC WITH DIFFERENTIAL/PLATELET  BASIC METABOLIC PANEL WITH GFR   ____________________________________________  EKG  *** ____________________________________________  RADIOLOGY  No results found.  ____________________________________________   PROCEDURES  Procedure(s) performed:   Procedures  None  ____________________________________________   INITIAL  IMPRESSION / ASSESSMENT AND PLAN / ED COURSE  Pertinent labs & imaging results that were available during my care of the patient were reviewed by me and considered in my medical decision making (see chart for details).   This patient is Presenting for Evaluation of leg swelling, which does require a range of treatment options, and is a complaint that involves a high risk of morbidity and mortality.  The Differential Diagnoses include venous stasis dermatitis, cellulitis, DVT, fluid overload, etc.  Critical Interventions-    Medications  ipratropium-albuterol  (DUONEB) 0.5-2.5 (3) MG/3ML nebulizer solution 3 mL (has no administration in time range)    Reassessment after intervention:  SOB improved. No new O2 requirement.   Clinical Laboratory Tests Ordered, included ***  Radiologic Tests Ordered, included ***. I independently interpreted the images and agree with radiology interpretation.   Cardiac Monitor Tracing which shows NSR.    Social Determinants of Health Risk patient is not an active smoker.   Consult complete with  Medical Decision Making: Summary:  Patient presents to the emergency department with leg swelling, worse on the right.  She was prescribed a second dose of amoxicillin by her PCP but my overall impression is less concerning for cellulitis but seems more consistent with venous stasis dermatitis.  The swelling and redness is slightly asymmetric and so plan for DVT ultrasound chest x-ray with some intermittent shortness of breath on my initial evaluation but that has cleared after DuoNeb.   Reevaluation with update and discussion with   ***Considered admission***  Patient's presentation is most consistent with acute presentation with potential threat to life or bodily function.   Disposition:   ____________________________________________  FINAL CLINICAL IMPRESSION(S) / ED DIAGNOSES  Final diagnoses:  None     NEW OUTPATIENT MEDICATIONS STARTED DURING  THIS VISIT:  New Prescriptions   No medications on file    Note:  This document was prepared using Dragon voice recognition software and may include unintentional dictation errors.  Fonda Law, MD, [facility]  "

## 2024-09-01 NOTE — Assessment & Plan Note (Signed)
 Contributing to comorbidity and complicating medical management     Nutritional follow up as an out pt would be recommended

## 2024-09-01 NOTE — Assessment & Plan Note (Signed)
-   Pt diagnosed with CHF based on presence of the following , rales on exam  bilateral leg edema, DOE, With noted response to IV diuretic in ER  admit on telemetry,  cycle cardiac enzymes, TROP 20 - 21 obtain serial ECG  to evaluate for ischemia as a cause of heart failure  monitor daily weight: There were no vitals filed for this visit. Last BNP proBNP 3,195.0 High     diurese with IV lasix    40 mg IV  Daily and monitor orthostatics and creatinine to avoid over diuresis.  Order echogram to evaluate EF and valves

## 2024-09-02 ENCOUNTER — Observation Stay (HOSPITAL_COMMUNITY)

## 2024-09-02 ENCOUNTER — Encounter (HOSPITAL_COMMUNITY): Payer: Self-pay | Admitting: Internal Medicine

## 2024-09-02 DIAGNOSIS — R079 Chest pain, unspecified: Secondary | ICD-10-CM

## 2024-09-02 DIAGNOSIS — Z79899 Other long term (current) drug therapy: Secondary | ICD-10-CM | POA: Diagnosis not present

## 2024-09-02 DIAGNOSIS — J9611 Chronic respiratory failure with hypoxia: Secondary | ICD-10-CM | POA: Diagnosis present

## 2024-09-02 DIAGNOSIS — J449 Chronic obstructive pulmonary disease, unspecified: Secondary | ICD-10-CM | POA: Diagnosis present

## 2024-09-02 DIAGNOSIS — Z9981 Dependence on supplemental oxygen: Secondary | ICD-10-CM | POA: Diagnosis not present

## 2024-09-02 DIAGNOSIS — Z7951 Long term (current) use of inhaled steroids: Secondary | ICD-10-CM | POA: Diagnosis not present

## 2024-09-02 DIAGNOSIS — M7989 Other specified soft tissue disorders: Secondary | ICD-10-CM | POA: Diagnosis present

## 2024-09-02 DIAGNOSIS — Z7982 Long term (current) use of aspirin: Secondary | ICD-10-CM | POA: Diagnosis not present

## 2024-09-02 DIAGNOSIS — Z87891 Personal history of nicotine dependence: Secondary | ICD-10-CM | POA: Diagnosis not present

## 2024-09-02 DIAGNOSIS — L03115 Cellulitis of right lower limb: Secondary | ICD-10-CM | POA: Diagnosis present

## 2024-09-02 DIAGNOSIS — Z6841 Body Mass Index (BMI) 40.0 and over, adult: Secondary | ICD-10-CM | POA: Diagnosis not present

## 2024-09-02 DIAGNOSIS — Z993 Dependence on wheelchair: Secondary | ICD-10-CM | POA: Diagnosis not present

## 2024-09-02 DIAGNOSIS — J9621 Acute and chronic respiratory failure with hypoxia: Secondary | ICD-10-CM | POA: Diagnosis present

## 2024-09-02 DIAGNOSIS — Z8249 Family history of ischemic heart disease and other diseases of the circulatory system: Secondary | ICD-10-CM | POA: Diagnosis not present

## 2024-09-02 DIAGNOSIS — Z6837 Body mass index (BMI) 37.0-37.9, adult: Secondary | ICD-10-CM | POA: Diagnosis not present

## 2024-09-02 DIAGNOSIS — I679 Cerebrovascular disease, unspecified: Secondary | ICD-10-CM | POA: Diagnosis present

## 2024-09-02 DIAGNOSIS — L03116 Cellulitis of left lower limb: Secondary | ICD-10-CM | POA: Diagnosis present

## 2024-09-02 DIAGNOSIS — I252 Old myocardial infarction: Secondary | ICD-10-CM | POA: Diagnosis not present

## 2024-09-02 DIAGNOSIS — R7303 Prediabetes: Secondary | ICD-10-CM | POA: Diagnosis present

## 2024-09-02 DIAGNOSIS — E7800 Pure hypercholesterolemia, unspecified: Secondary | ICD-10-CM | POA: Diagnosis present

## 2024-09-02 DIAGNOSIS — Z66 Do not resuscitate: Secondary | ICD-10-CM | POA: Diagnosis present

## 2024-09-02 DIAGNOSIS — E8729 Other acidosis: Secondary | ICD-10-CM | POA: Diagnosis present

## 2024-09-02 DIAGNOSIS — I5043 Acute on chronic combined systolic (congestive) and diastolic (congestive) heart failure: Secondary | ICD-10-CM | POA: Diagnosis present

## 2024-09-02 DIAGNOSIS — E871 Hypo-osmolality and hyponatremia: Secondary | ICD-10-CM | POA: Diagnosis present

## 2024-09-02 DIAGNOSIS — I110 Hypertensive heart disease with heart failure: Secondary | ICD-10-CM | POA: Diagnosis present

## 2024-09-02 DIAGNOSIS — I5033 Acute on chronic diastolic (congestive) heart failure: Secondary | ICD-10-CM | POA: Diagnosis not present

## 2024-09-02 LAB — HEPATIC FUNCTION PANEL
ALT: 12 U/L (ref 0–44)
AST: 26 U/L (ref 15–41)
Albumin: 3.5 g/dL (ref 3.5–5.0)
Alkaline Phosphatase: 43 U/L (ref 38–126)
Bilirubin, Direct: 0.1 mg/dL (ref 0.0–0.2)
Indirect Bilirubin: 0.2 mg/dL — ABNORMAL LOW (ref 0.3–0.9)
Total Bilirubin: 0.3 mg/dL (ref 0.0–1.2)
Total Protein: 6.3 g/dL — ABNORMAL LOW (ref 6.5–8.1)

## 2024-09-02 LAB — ECHOCARDIOGRAM COMPLETE
AR max vel: 1.66 cm2
AV Area VTI: 1.97 cm2
AV Area mean vel: 1.76 cm2
AV Mean grad: 4 mmHg
AV Peak grad: 8.1 mmHg
Ao pk vel: 1.42 m/s
Area-P 1/2: 4.12 cm2
Calc EF: 42.8 %
MV VTI: 2.06 cm2
S' Lateral: 3.3 cm
Single Plane A2C EF: 42.8 %
Single Plane A4C EF: 43.5 %

## 2024-09-02 LAB — COMPREHENSIVE METABOLIC PANEL WITH GFR
ALT: 12 U/L (ref 0–44)
AST: 25 U/L (ref 15–41)
Albumin: 3.6 g/dL (ref 3.5–5.0)
Alkaline Phosphatase: 43 U/L (ref 38–126)
Anion gap: 3 — ABNORMAL LOW (ref 5–15)
BUN: 13 mg/dL (ref 8–23)
CO2: 42 mmol/L — ABNORMAL HIGH (ref 22–32)
Calcium: 8.9 mg/dL (ref 8.9–10.3)
Chloride: 96 mmol/L — ABNORMAL LOW (ref 98–111)
Creatinine, Ser: 0.85 mg/dL (ref 0.44–1.00)
GFR, Estimated: >60 mL/min
Glucose, Bld: 108 mg/dL — ABNORMAL HIGH (ref 70–99)
Potassium: 3.8 mmol/L (ref 3.5–5.1)
Sodium: 141 mmol/L (ref 135–145)
Total Bilirubin: 0.4 mg/dL (ref 0.0–1.2)
Total Protein: 6.4 g/dL — ABNORMAL LOW (ref 6.5–8.1)

## 2024-09-02 LAB — CBC
HCT: 42.4 % (ref 36.0–46.0)
Hemoglobin: 13 g/dL (ref 12.0–15.0)
MCH: 30.1 pg (ref 26.0–34.0)
MCHC: 30.7 g/dL (ref 30.0–36.0)
MCV: 98.1 fL (ref 80.0–100.0)
Platelets: 131 K/uL — ABNORMAL LOW (ref 150–400)
RBC: 4.32 MIL/uL (ref 3.87–5.11)
RDW: 13.9 % (ref 11.5–15.5)
WBC: 8.7 K/uL (ref 4.0–10.5)
nRBC: 0 % (ref 0.0–0.2)

## 2024-09-02 LAB — PHOSPHORUS: Phosphorus: 4.3 mg/dL (ref 2.5–4.6)

## 2024-09-02 LAB — MAGNESIUM: Magnesium: 2.4 mg/dL (ref 1.7–2.4)

## 2024-09-02 MED ORDER — BUDESONIDE 0.25 MG/2ML IN SUSP
0.2500 mg | Freq: Two times a day (BID) | RESPIRATORY_TRACT | Status: DC
  Administered 2024-09-02 – 2024-09-10 (×17): 0.25 mg via RESPIRATORY_TRACT
  Filled 2024-09-02 (×17): qty 2

## 2024-09-02 MED ORDER — PERFLUTREN LIPID MICROSPHERE
1.0000 mL | INTRAVENOUS | Status: AC | PRN
  Administered 2024-09-02: 3 mL via INTRAVENOUS

## 2024-09-02 MED ORDER — METOPROLOL SUCCINATE ER 50 MG PO TB24
50.0000 mg | ORAL_TABLET | Freq: Every day | ORAL | Status: DC
  Administered 2024-09-02 – 2024-09-10 (×9): 50 mg via ORAL
  Filled 2024-09-02 (×9): qty 1

## 2024-09-02 MED ORDER — CEFAZOLIN SODIUM-DEXTROSE 2-4 GM/100ML-% IV SOLN
2.0000 g | Freq: Three times a day (TID) | INTRAVENOUS | Status: DC
  Administered 2024-09-02 – 2024-09-03 (×4): 2 g via INTRAVENOUS
  Filled 2024-09-02 (×5): qty 100

## 2024-09-02 NOTE — Evaluation (Signed)
 Physical Therapy Evaluation Patient Details Name: [name] MRN: [id number] DOB: Dec 07, 1932 Today's Date: 09/02/2024  History of Present Illness  Pt is an 89y.o. female admitted on 09/01/24 from home due to a fall, generalized weakness and malaise. PMH: HFpEF, COPD on 2L, HTN, dyslipidemia, and h/o CVA.  Clinical Impression  PTA, pt was living alone and IND with all functional mobility. She has a HCA who provides assistance for IADLs and transportation M-F during the day. She currently requires CGA-MIN A for bed mobility, transfers and short distance ambulation. See below for current deficits. Vitals remained stable throughout session with pt on 2L. She will benefit from skilled therapy services at discharge with HHPT to address current functional deficits. She has good support from [facility].        If plan is discharge home, recommend the following: A little help with walking and/or transfers;A little help with bathing/dressing/bathroom;Assistance with cooking/housework;Assist for transportation;Help with stairs or ramp for entrance   Can travel by private vehicle        Equipment Recommendations None recommended by PT  Recommendations for Other Services       Functional Status Assessment Patient has had a recent decline in their functional status and demonstrates the ability to make significant improvements in function in a reasonable and predictable amount of time.     Precautions / Restrictions Precautions Precautions: Fall Recall of Precautions/Restrictions: Intact Restrictions Weight Bearing Restrictions Per Provider Order: No      Mobility  Bed Mobility Overal bed mobility: Needs Assistance Bed Mobility: Supine to Sit, Sit to Supine     Supine to sit: Min assist Sit to supine: Min assist   General bed mobility comments: assistance required secondary to height of bed and short stature, assistance for anterior/posterior scooting    Transfers Overall transfer level:  Needs assistance Equipment used: 1 person hand held assist Transfers: Sit to/from Stand, Bed to chair/wheelchair/BSC Sit to Stand: Contact guard assist   Step pivot transfers: Contact guard assist       General transfer comment: CGA for all transfers with HHA to move from gurney<>BSC, slight forward flexed posture and assistance to manage lines/leads    Ambulation/Gait Ambulation/Gait assistance: Contact guard assist Gait Distance (Feet): 5 Feet (gurney<>BSC) Assistive device: 1 person hand held assist Gait Pattern/deviations: Wide base of support, Trunk flexed, Step-to pattern, Decreased step length - right, Decreased step length - left Gait velocity: decreased     General Gait Details: slow, steady gait while taking steps to [facility], benefits from HHA and VC for sequencing of steps while turning and PT managing lines  Stairs            Wheelchair Mobility     Tilt Bed    Modified Rankin (Stroke Patients Only)       Balance Overall balance assessment: Needs assistance Sitting-balance support: Bilateral upper extremity supported, Feet unsupported (feet unable to reach floor due to height of gurney) Sitting balance-Leahy Scale: Fair     Standing balance support: Bilateral upper extremity supported, During functional activity Standing balance-Leahy Scale: Fair                               Pertinent Vitals/Pain Pain Assessment Pain Assessment: No/denies pain    Home Living Family/patient expects to be discharged to:: Private residence Living Arrangements: Alone Available Help at Discharge: Friend(s);Family;Available PRN/intermittently Type of Home: House Home Access: Stairs to enter Entrance Stairs-Rails: None Entrance  Stairs-Number of Steps: 1   Home Layout: One level Home Equipment: Shower seat - built in;Rollator (4 wheels);Electric scooter Additional Comments: home O2 at night    Prior Function               Mobility Comments: amb in  home with rollator, no falls other than reason for admit ADLs Comments: IND with ADLs, hired aid helps with all IADLs M-F all day(meals, laundry, grocery pick up, transportation), company helps with cleaning,     Extremity/Trunk Assessment   Upper Extremity Assessment Upper Extremity Assessment: Overall WFL for tasks assessed    Lower Extremity Assessment Lower Extremity Assessment: Generalized weakness    Cervical / Trunk Assessment Cervical / Trunk Assessment: Normal  Communication   Communication Communication: No apparent difficulties    Cognition Arousal: Alert Behavior During Therapy: WFL for tasks assessed/performed                             Following commands: Intact       Cueing Cueing Techniques: Verbal cues, Tactile cues     General Comments      Exercises     Assessment/Plan    PT Assessment Patient needs continued PT services  PT Problem List Decreased strength;Decreased range of motion;Decreased activity tolerance;Decreased balance;Decreased mobility       PT Treatment Interventions Gait training;Functional mobility training;Therapeutic activities;Therapeutic exercise;Balance training    PT Goals (Current goals can be found in the Care Plan section)  Acute Rehab PT Goals Patient Stated Goal: get moving again PT Goal Formulation: With patient Time For Goal Achievement: 09/16/24 Potential to Achieve Goals: Good    Frequency Min 2X/week     Co-evaluation PT/OT/SLP Co-Evaluation/Treatment: Yes Reason for Co-Treatment: To address functional/ADL transfers PT goals addressed during session: Mobility/safety with mobility;Strengthening/ROM         AM-PAC PT 6 Clicks Mobility  Outcome Measure Help needed turning from your back to your side while in a flat bed without using bedrails?: A Little Help needed moving from lying on your back to sitting on the side of a flat bed without using bedrails?: A Little Help needed moving to  and from a bed to a chair (including a wheelchair)?: A Little Help needed standing up from a chair using your arms (e.g., wheelchair or bedside chair)?: A Little Help needed to walk in hospital room?: A Little Help needed climbing 3-5 steps with a railing? : A Lot 6 Click Score: 17    End of Session Equipment Utilized During Treatment: Gait belt;Oxygen Activity Tolerance: Patient tolerated treatment well;Patient limited by fatigue Patient left: in bed;with call bell/phone within reach Nurse Communication: Mobility status PT Visit Diagnosis: Unsteadiness on feet (R26.81);Muscle weakness (generalized) (M62.81);History of falling (Z91.81)    Time: 8954-8876 PT Time Calculation (min) (ACUTE ONLY): 38 min   Charges:   PT Evaluation $PT Eval Low Complexity: 1 Low PT Treatments $Therapeutic Activity: 8-22 mins           Isaiah DEL. Maecy Podgurski, PT, DPT   [employer] 09/02/2024, 1:15 PM

## 2024-09-02 NOTE — Progress Notes (Signed)
" °   09/02/24 2324  BiPAP/CPAP/SIPAP  BiPAP/CPAP/SIPAP Pt Type Adult  BiPAP/CPAP/SIPAP DREAMSTATIOND  Mask Type Full face mask  Dentures removed? Not applicable  Mask Size Medium  Flow Rate 2 lpm  Patient Home Machine No  Patient Home Mask No  Patient Home Tubing No  Auto Titrate Yes  Minimum cmH2O 5 cmH2O  Maximum cmH2O 15 cmH2O  Device Plugged into RED Power Outlet Yes    "

## 2024-09-02 NOTE — ED Notes (Signed)
 Patient is resting in bed at this time with no complaints. Patient was given something to drink and canister changed on purwick. Patient remains on O2 and stable at this time. Patient has no other needs expressed.

## 2024-09-02 NOTE — Evaluation (Signed)
 Occupational Therapy Evaluation Patient Details Name: [name] MRN: [id number] DOB: 1933/01/07 Today's Date: 09/02/2024   History of Present Illness   Pt is an 89y.o. female admitted on 09/01/24 from home due to a fall, generalized weakness and malaise. PMH: HFpEF, COPD on 2L, HTN, dyslipidemia, and h/o CVA.     Clinical Impressions Pt admitted with the above concerns. Pt currently with functional limitations due to the deficits listed below (see OT Problem List). Pt reports that she lives alone and receives assistance from a paid home health aid for IADL tasks.  Pt utilizes Rolator for functional mobility. Pt will benefit from acute skilled OT to increase their safety and independence with ADL and functional mobility for ADL to facilitate discharge. Recommend Home health OT services to increase overall functional independence once pt returns home.      If plan is discharge home, recommend the following:   A little help with walking and/or transfers;A little help with bathing/dressing/bathroom     Functional Status Assessment   Patient has had a recent decline in their functional status and demonstrates the ability to make significant improvements in function in a reasonable and predictable amount of time.     Equipment Recommendations   None recommended by OT      Precautions/Restrictions   Precautions Precautions: Fall Recall of Precautions/Restrictions: Intact Restrictions Weight Bearing Restrictions Per Provider Order: No     Mobility Bed Mobility Overal bed mobility: Needs Assistance Bed Mobility: Supine to Sit, Sit to Supine     Supine to sit: Min assist Sit to supine: Min assist   General bed mobility comments: assistance required secondary to height of bed and short stature, assistance for anterior/posterior scooting    Transfers Overall transfer level: Needs assistance Equipment used: 1 person hand held assist Transfers: Sit to/from Stand, Bed  to chair/wheelchair/BSC Sit to Stand: Contact guard assist     Step pivot transfers: Contact guard assist     General transfer comment: CGA for all transfers with HHA to move from gurney<>BSC, slight forward flexed posture and assistance to manage lines/leads      Balance Overall balance assessment: Needs assistance Sitting-balance support: Bilateral upper extremity supported, Feet unsupported (feet unable to reach floor d/t height of gurney) Sitting balance-Leahy Scale: Fair     Standing balance support: Bilateral upper extremity supported, During functional activity Standing balance-Leahy Scale: Fair        ADL either performed or assessed with clinical judgement   ADL Overall ADL's : Needs assistance/impaired Eating/Feeding: Independent;Bed level   Grooming: Oral care;Set up;Bed level      Toilet Transfer: Contact guard assist;Ambulation;BSC/3in1 (hand held assistance)   Toileting- Clothing Manipulation and Hygiene: Total assistance;Sit to/from stand Toileting - Clothing Manipulation Details (indicate cue type and reason): pt requested assist        Vision Baseline Vision/History: 1 Wears glasses Ability to See in Adequate Light: 0 Adequate Patient Visual Report: No change from baseline Vision Assessment?: Wears glasses for reading     Perception Perception: Within Functional Limits       Praxis Praxis: WFL       Pertinent Vitals/Pain Pain Assessment Pain Assessment: No/denies pain     Extremity/Trunk Assessment Upper Extremity Assessment Upper Extremity Assessment: Overall WFL for tasks assessed   Lower Extremity Assessment Lower Extremity Assessment: Defer to PT evaluation   Cervical / Trunk Assessment Cervical / Trunk Assessment: Other exceptions Cervical / Trunk Exceptions: body habitus   Communication Communication Communication: No apparent difficulties  Cognition Arousal: Alert Behavior During Therapy: WFL for tasks  assessed/performed Cognition: No apparent impairments       Following commands: Intact       Cueing  General Comments   Cueing Techniques: Verbal cues;Tactile cues  VSS on 2L O2, SpO2 in 90's           Home Living Family/patient expects to be discharged to:: Private residence Living Arrangements: Alone Available Help at Discharge: Friend(s);Family;Available PRN/intermittently Type of Home: House Home Access: Stairs to enter [employer] of Steps: 1 Entrance Stairs-Rails: None Home Layout: One level     Bathroom Shower/Tub: Producer, Television/film/video: Handicapped height Bathroom Accessibility: Yes   Home Equipment: Shower seat - built in;Rollator (4 wheels);Electric scooter   Additional Comments: home O2 at night      Prior Functioning/Environment Prior Level of Function : Independent/Modified Independent    Mobility Comments: amb in home with rollator, no falls other than reason for admit ADLs Comments: IND with ADLs, hired aid helps with all IADLs M-F all day(meals, laundry, grocery pick up, transportation), company helps with cleaning,    OT Problem List: Decreased strength;Impaired balance (sitting and/or standing)   OT Treatment/Interventions: Self-care/ADL training;Therapeutic exercise;Energy conservation;DME and/or AE instruction;Therapeutic activities;Patient/family education;Balance training      OT Goals(Current goals can be found in the care plan section)   Acute Rehab OT Goals Patient Stated Goal: to move around OT Goal Formulation: With patient Time For Goal Achievement: 09/16/24 Potential to Achieve Goals: Good   OT Frequency:  Min 2X/week    Co-evaluation PT/OT/SLP Co-Evaluation/Treatment: Yes Reason for Co-Treatment: To address functional/ADL transfers PT goals addressed during session: Mobility/safety with mobility;Strengthening/ROM OT goals addressed during session: ADL's and self-care      AM-PAC OT 6 Clicks  Daily Activity     Outcome Measure Help from another person eating meals?: None Help from another person taking care of personal grooming?: None Help from another person toileting, which includes using toliet, bedpan, or urinal?: Total Help from another person bathing (including washing, rinsing, drying)?: A Lot Help from another person to put on and taking off regular upper body clothing?: A Little Help from another person to put on and taking off regular lower body clothing?: Total 6 Click Score: 15   End of Session Equipment Utilized During Treatment: Gait belt;Oxygen Nurse Communication: Mobility status  Activity Tolerance: Patient tolerated treatment well Patient left: in bed;with call bell/phone within reach  OT Visit Diagnosis: Unsteadiness on feet (R26.81);History of falling (Z91.81);Muscle weakness (generalized) (M62.81)                Time: 8954-8878 OT Time Calculation (min): 36 min Charges:  OT General Charges $OT Visit: 1 Visit OT Evaluation $OT Eval Moderate Complexity: 1 Mod OT Treatments $Self Care/Home Management : 8-22 mins  Leita Howell, OTR/L,CBIS  Supplemental OT - [facility] and [facility] Secure Chat Preferred    Mychelle Kendra, Leita BIRCH 09/02/2024, 1:33 PM

## 2024-09-02 NOTE — Progress Notes (Signed)
 " PROGRESS NOTE    [name]  FMW:[id number] DOB: 12-28-1932 DOA: 09/01/2024 PCP: Linda [name], MD  Chief Complaint  Patient presents with   Leg Injury    Brief Narrative:   89 yo with hx HFpEF, COPD on 2 L, HTN, dyslipidemia, hx CVA who presented with general malaise and [name] fall.  She complained of generalized weakness.  She'd been treated with amoxicillin for cellulitis outpatient.  Diuretics also adjusted for concern for HF.    Assessment & Plan:   Principal Problem:   Acute on chronic diastolic CHF (congestive heart failure) (HCC) Active Problems:   Essential hypertension   COPD (chronic obstructive pulmonary disease) (HCC)   Body mass index (BMI) 37.0-37.9, adult   Chronic respiratory failure with hypoxia and hypercapnia (HCC)   History of stroke   Cellulitis  Volume Overload Acute on chronic diastolic HF Echo 03/7973 with EF 50-55%, possible apical/apical septal hypokinesis CXR with cardiomegaly, atelectasis L lung base Albumin 3.5, UA without protein Troponin mildly elevated, but flat Pro BNP 3195 (no priors for comparison) + LE edema on exam Repeat echo pending Will continue lasix  daily (adjust prn) Holding SGLT2 inhibitor for now RLE US  negative for DVT Strict I/O, daily weights  Bilateral LE Cellulitis Suspect she has bilateral venous stasis changes - she notes improvement since admission in LE redness, possible superimposed cellulitis, but no fever, leukocytosis, or TTP on my exam - will try to keep abx course as short as possible.  Nonpurulent.  Plain films only with diffuse edema  Chronic Hypoxic Resp Failure COPD on 2 L  Respiratory Acidosis Currently requiring 4 L by [name] Mild wheezing on my exam, continue scheduled duonebs with prn albuterol  (I don't think severe enough to warrant steroids) Compensated respiratory acidosis, she declined bipap  Hypertension Metoprolol   Hx CVA  NSTEMI Aspirin , statin  Prediabetes SGLT2 inhibitor on  hold    DVT prophylaxis: heparin  Code Status: DNR Family Communication: none at bedside Disposition:   Status is: Observation The patient remains OBS appropriate and will d/c before 2 midnights.   Consultants:  none  Procedures:  none  Antimicrobials:  Anti-infectives (From admission, onward)    Start     Dose/Rate Route Frequency Ordered Stop   09/01/24 2200  linezolid  (ZYVOX ) IVPB 600 mg        600 mg 300 mL/hr over 60 Minutes Intravenous Every 12 hours 09/01/24 2134 09/08/24 2159       Subjective: Feels better, her legs are less red Wondering why she's on 4 L oxygen  Objective: Vitals:   09/02/24 0130 09/02/24 0334 09/02/24 0412 09/02/24 0500  BP: (!) 159/80 (!) 143/63  (!) 151/61  Pulse: 80 81  75  Resp: 19 (!) 25  (!) 24  Temp:   97.9 F (36.6 C)   TempSrc:   Oral   SpO2: 96% 97%  98%    Intake/Output Summary (Last 24 hours) at 09/02/2024 0842 Last data filed at 09/02/2024 0131 Gross per 24 hour  Intake 533.84 ml  Output 1100 ml  Net -566.16 ml   There were no vitals filed for this visit.  Examination:  General exam: appears stated age, comfortable  Respiratory system: diminished, faint expiratory wheezing  Cardiovascular system: RRR Gastrointestinal system: Abdomen is nondistended, soft and nontender.  Central nervous system: Alert and oriented. No focal neurological deficits. Extremities: bilateral LE's with erythema (patient notes this is improved), no tenderness to palpation.  1+ LE edema.     Data Reviewed:  I have personally reviewed following labs and imaging studies  CBC: Recent Labs  Lab 09/01/24 1701 09/02/24 0542  WBC 8.7 8.7  NEUTROABS 6.1  --   HGB 14.0 13.0  HCT 45.8 42.4  MCV 97.9 98.1  PLT 151 131*    Basic Metabolic Panel: Recent Labs  Lab 09/01/24 1702 09/02/24 0542  NA 142 141  K 4.1 3.8  CL 95* 96*  CO2 39* 42*  GLUCOSE 105* 108*  BUN 17 13  CREATININE 0.82 0.85  CALCIUM  9.2 8.9  MG  --  2.4  PHOS  --   4.3    GFR: CrCl cannot be calculated (Unknown ideal weight.).  Liver Function Tests: Recent Labs  Lab 09/02/24 0542  AST 26  25  ALT 12  12  ALKPHOS 43  43  BILITOT 0.3  0.4  PROT 6.3*  6.4*  ALBUMIN 3.5  3.6    CBG: No results for input(s): GLUCAP in the last 168 hours.   Recent Results (from the past 240 hours)  Surgical pcr screen     Status: None   Collection Time: 09/01/24 10:08 PM  Result Value Ref Range Status   MRSA, PCR NEGATIVE NEGATIVE Final   Staphylococcus aureus NEGATIVE NEGATIVE Final    Comment: (NOTE) The Xpert SA Assay (FDA approved for NASAL specimens in patients 30 years of age and older), is one component of [name] comprehensive surveillance program. It is not intended to diagnose infection nor to guide or monitor treatment. Performed at [facility], [address] [place], [place] [address]          Radiology Studies: DG Tibia/Fibula Right Result Date: 09/01/2024 EXAM: 3 VIEW(S) XRAY OF THE KNEE 09/01/2024 08:24:00 PM COMPARISON: None available. CLINICAL HISTORY: Cellulitis to both tib fibs FINDINGS: BONES AND JOINTS: No acute fracture. No malalignment. Degenerative changes of the knee. SOFT TISSUES: Moderate diffuse subcutaneous soft tissue edema. Vascular calcification. IMPRESSION: 1. Moderate diffuse subcutaneous soft tissue edema. No acute osseous abnormality. Electronically signed by: Linda Pebbles MD 09/01/2024 08:33 PM EST RP Workstation: [id number]   DG Tibia/Fibula Left Result Date: 09/01/2024 EXAM: 1 VIEW XRAY OF THE LEFT TIBIA AND FIBULA 09/01/2024 08:24:00 PM COMPARISON: None available. CLINICAL HISTORY: Cellulitis to both tib fibs FINDINGS: BONES AND JOINTS: No acute fracture. No malalignment. SOFT TISSUES: Mild diffuse soft tissue edema. IMPRESSION: 1. Mild diffuse soft tissue edema. No acute osseous abnormality. Electronically signed by: Linda Pebbles MD 09/01/2024 08:31 PM EST RP Workstation:  [id number]   VAS US  LOWER EXTREMITY VENOUS (DVT) (7a-5p) Result Date: 09/01/2024  Lower Venous DVT Study Patient Name:  [name]  Date of Exam:   09/01/2024 Medical Rec #: [id number]      Accession #:    [id number] Date of Birth: 1933-06-09      Patient Gender: F Patient Age:   3 years Exam Location:  [facility] Procedure:      VAS US  LOWER EXTREMITY VENOUS (DVT) Referring Phys: JOSHUA LONG --------------------------------------------------------------------------------  Indications: Pain, Erythema, and Swelling. Other Indications: Cellulitis. Limitations: Poor ultrasound/tissue interface. Comparison Study: Previous exam on 08/22/2023 was negative for DVT Performing Technologist: Ezzie Potters RVT, RDMS  Examination Guidelines: Aram Domzalski complete evaluation includes B-mode imaging, spectral Doppler, color Doppler, and power Doppler as needed of all accessible portions of each vessel. Bilateral testing is considered an integral part of Finis Hendricksen complete examination. Limited examinations for reoccurring indications may be performed as noted. The reflux portion of the exam is performed with the  patient in reverse Trendelenburg.  +---------+---------------+---------+-----------+----------+--------------+ RIGHT    CompressibilityPhasicitySpontaneityPropertiesThrombus Aging +---------+---------------+---------+-----------+----------+--------------+ CFV      Full                    Yes                                 +---------+---------------+---------+-----------+----------+--------------+ SFJ      Full                                                        +---------+---------------+---------+-----------+----------+--------------+ FV Prox  Full                    Yes                                 +---------+---------------+---------+-----------+----------+--------------+ FV Mid   Full                    Yes                                  +---------+---------------+---------+-----------+----------+--------------+ FV DistalFull                    Yes                                 +---------+---------------+---------+-----------+----------+--------------+ PFV      Full                                                        +---------+---------------+---------+-----------+----------+--------------+ POP      Full                    Yes                                 +---------+---------------+---------+-----------+----------+--------------+ PTV      Full                                                        +---------+---------------+---------+-----------+----------+--------------+ PERO     Full                                                        +---------+---------------+---------+-----------+----------+--------------+   +----+---------------+---------+-----------+----------+--------------+ LEFTCompressibilityPhasicitySpontaneityPropertiesThrombus Aging +----+---------------+---------+-----------+----------+--------------+ CFV Full           Yes      Yes                                 +----+---------------+---------+-----------+----------+--------------+  Summary: RIGHT: - There is no evidence of deep vein thrombosis in the lower extremity.  - No cystic structure found in the popliteal fossa.  LEFT: - No evidence of common femoral vein obstruction.   *See table(s) above for measurements and observations. Electronically signed by Gaile New MD on 09/01/2024 at 5:42:28 PM.    Final    DG Chest 2 View Result Date: 09/01/2024 CLINICAL DATA:  Shortness of breath EXAM: CHEST - 2 VIEW COMPARISON:  09/09/2023 FINDINGS: Borderline cardiac enlargement. Aortic atherosclerosis. Probable atelectasis left lung base. No sizable pleural effusion IMPRESSION: Borderline cardiomegaly. Probable atelectasis left lung base. Electronically Signed   By: Luke Bun M.D.   On: 09/01/2024 15:50         Scheduled Meds:  aspirin  EC  81 mg Oral Daily   furosemide   40 mg Intravenous Daily   guaiFENesin   600 mg Oral BID   heparin   5,000 Units Subcutaneous Q8H   ipratropium-albuterol   3 mL Inhalation TID   rosuvastatin   40 mg Oral Daily   sodium chloride  flush  3 mL Intravenous Q12H   Continuous Infusions:  sodium chloride      linezolid  (ZYVOX ) IV Stopped (09/02/24 0030)     LOS: 0 days    Time spent: over 30 min     Meliton Monte, MD [facility]   To contact the attending provider between 7A-7P or the covering provider during after hours 7P-7A, please log into the web site [email or web address] and access using universal  password for that web site. If you do not have the password, please call the hospital operator.  09/02/2024, 8:42 AM    "

## 2024-09-02 NOTE — ED Notes (Signed)
 Patient on CCMD

## 2024-09-03 ENCOUNTER — Encounter (HOSPITAL_COMMUNITY): Payer: Self-pay | Admitting: Internal Medicine

## 2024-09-03 DIAGNOSIS — I5033 Acute on chronic diastolic (congestive) heart failure: Secondary | ICD-10-CM | POA: Diagnosis not present

## 2024-09-03 LAB — CBC WITH DIFFERENTIAL/PLATELET
Abs Immature Granulocytes: 0.02 K/uL (ref 0.00–0.07)
Basophils Absolute: 0.1 K/uL (ref 0.0–0.1)
Basophils Relative: 1 %
Eosinophils Absolute: 0.2 K/uL (ref 0.0–0.5)
Eosinophils Relative: 2 %
HCT: 45.4 % (ref 36.0–46.0)
Hemoglobin: 13.4 g/dL (ref 12.0–15.0)
Immature Granulocytes: 0 %
Lymphocytes Relative: 25 %
Lymphs Abs: 2.1 K/uL (ref 0.7–4.0)
MCH: 29.2 pg (ref 26.0–34.0)
MCHC: 29.5 g/dL — ABNORMAL LOW (ref 30.0–36.0)
MCV: 98.9 fL (ref 80.0–100.0)
Monocytes Absolute: 0.8 K/uL (ref 0.1–1.0)
Monocytes Relative: 9 %
Neutro Abs: 5.3 K/uL (ref 1.7–7.7)
Neutrophils Relative %: 63 %
Platelets: 142 K/uL — ABNORMAL LOW (ref 150–400)
RBC: 4.59 MIL/uL (ref 3.87–5.11)
RDW: 13.7 % (ref 11.5–15.5)
WBC: 8.4 K/uL (ref 4.0–10.5)
nRBC: 0 % (ref 0.0–0.2)

## 2024-09-03 LAB — COMPREHENSIVE METABOLIC PANEL WITH GFR
ALT: 7 U/L (ref 0–44)
AST: 24 U/L (ref 15–41)
Albumin: 3.6 g/dL (ref 3.5–5.0)
Alkaline Phosphatase: 45 U/L (ref 38–126)
Anion gap: 5 (ref 5–15)
BUN: 11 mg/dL (ref 8–23)
CO2: 40 mmol/L — ABNORMAL HIGH (ref 22–32)
Calcium: 9.1 mg/dL (ref 8.9–10.3)
Chloride: 93 mmol/L — ABNORMAL LOW (ref 98–111)
Creatinine, Ser: 0.82 mg/dL (ref 0.44–1.00)
GFR, Estimated: >60 mL/min
Glucose, Bld: 102 mg/dL — ABNORMAL HIGH (ref 70–99)
Potassium: 3.8 mmol/L (ref 3.5–5.1)
Sodium: 137 mmol/L (ref 135–145)
Total Bilirubin: 0.4 mg/dL (ref 0.0–1.2)
Total Protein: 6.6 g/dL (ref 6.5–8.1)

## 2024-09-03 LAB — MAGNESIUM: Magnesium: 2.3 mg/dL (ref 1.7–2.4)

## 2024-09-03 LAB — PHOSPHORUS: Phosphorus: 3.6 mg/dL (ref 2.5–4.6)

## 2024-09-03 MED ORDER — CEFADROXIL 500 MG PO CAPS
500.0000 mg | ORAL_CAPSULE | Freq: Two times a day (BID) | ORAL | Status: AC
  Administered 2024-09-03 – 2024-09-06 (×7): 500 mg via ORAL
  Filled 2024-09-03 (×7): qty 1

## 2024-09-03 MED ORDER — POTASSIUM CHLORIDE CRYS ER 20 MEQ PO TBCR
40.0000 meq | EXTENDED_RELEASE_TABLET | Freq: Once | ORAL | Status: AC
  Administered 2024-09-03: 40 meq via ORAL
  Filled 2024-09-03: qty 2

## 2024-09-03 MED ORDER — FUROSEMIDE 10 MG/ML IJ SOLN
40.0000 mg | Freq: Two times a day (BID) | INTRAMUSCULAR | Status: DC
  Administered 2024-09-03 – 2024-09-05 (×5): 40 mg via INTRAVENOUS
  Filled 2024-09-03 (×5): qty 4

## 2024-09-03 NOTE — Progress Notes (Signed)
" °   09/03/24 1937  BiPAP/CPAP/SIPAP  BiPAP/CPAP/SIPAP Pt Type Adult  BiPAP/CPAP/SIPAP DREAMSTATIOND  Reason BIPAP/CPAP not in use Non-compliant (Pt states she was uncomfortable while wearing her CPAP last night and does not want to wear it tonight. RT advised to call if she changes her mind.)  BiPAP/CPAP /SiPAP Vitals  SpO2 96 %  Bilateral Breath Sounds Diminished    "

## 2024-09-03 NOTE — TOC Initial Note (Signed)
 Transition of Care [facility]) - Initial/Assessment Note    Patient Details  Name: [name] MRN: [id number] Date of Birth: 06-Nov-1932  Transition of Care [facility]) CM/SW Contact:    Bascom Service, RN Phone Number: 09/03/2024, 4:14 PM  Clinical Narrative:spoke to patient/sister [name] on phone-d/c plan home w/HHC-await choices to offer-no preference;Active w/[facility] for home 02;has private duty care, Plans to have own transport for home,but may need PTAR. Ptient concerned about clothing-she may try to call someone to bring clothes to hospital;I informed her we can wrap her up w/blankets for a safe d/c home. Await outcome if PTAR needed.                   Expected Discharge Plan: Home w Home Health Services Barriers to Discharge: Continued Medical Work up   Patient Goals and CMS Choice Patient states their goals for this hospitalization and ongoing recovery are:: Home CMS Medicare.gov Compare Post Acute Care list provided to:: Patient Choice offered to / list presented to : Patient [place] ownership interest in [facility].provided to:: Patient    Expected Discharge Plan and Services   Discharge Planning Services: CM Consult Post Acute Care Choice: Home Health Living arrangements for the past 2 months: Single Family Home                                      Prior Living Arrangements/Services Living arrangements for the past 2 months: Single Family Home Lives with:: Self              Current home services: DME, Homehealth aide ([facility] home 02-has travel tank;private duty care 4hrs/day)    Activities of Daily Living   ADL Screening (condition at time of admission) Independently performs ADLs?: Yes (appropriate for developmental age) Is the patient deaf or have difficulty hearing?: No Does the patient have difficulty seeing, even when wearing glasses/contacts?: No Does the patient have difficulty concentrating, remembering, or making  decisions?: No  Permission Sought/Granted                  Emotional Assessment              Admission diagnosis:  Leg swelling [M79.89] Acute respiratory failure with hypoxia (HCC) [J96.01] Acute on chronic diastolic CHF (congestive heart failure) (HCC) [I50.33] Patient Active Problem List   Diagnosis Date Noted   Acute on chronic diastolic CHF (congestive heart failure) (HCC) 09/01/2024   History of stroke 09/01/2024   Cellulitis 09/01/2024   COPD with acute exacerbation (HCC) 08/22/2023   Abnormal EKG 08/22/2023   NSTEMI (non-ST elevated myocardial infarction) (HCC) 08/22/2023   Macrocytosis 08/22/2023   Lower extremity edema 08/22/2023   Acute on chronic respiratory failure with hypoxia and hypercapnia (HCC) 08/22/2023   Acute pulmonary edema (HCC) 08/22/2023   Acute on chronic heart failure with preserved ejection fraction (HFpEF) (HCC) 08/22/2023   Acute on chronic congestive heart failure (HCC) 08/22/2023   Carotid artery disease 05/11/2022   Chronic respiratory failure with hypoxia and hypercapnia (HCC) 03/19/2022   Pain in joint of left knee 01/15/2022   Lumbar radiculopathy 12/04/2021   Disorder of bone 10/16/2021   Allergic rhinitis 07/05/2021   Body mass index (BMI) 37.0-37.9, adult 07/05/2021   Cardiac arrhythmia 07/05/2021   (HFpEF) heart failure with preserved ejection fraction (HCC) 07/05/2021   Incontinence of urine 07/05/2021   Lesion of ulnar nerve 07/05/2021  Osteoarthritis of knee 07/05/2021   Overactive bladder 07/05/2021   History of colonic polyps 07/05/2021   Pure hypercholesterolemia 07/05/2021   Sciatica 07/05/2021   Sleep disturbance    Prediabetes    Hyperglycemia    Hypercapnia    Stroke (cerebrum) (HCC) 10/26/2020   Encephalopathy    Hyponatremia    Supplemental oxygen dependent    Chronic low back pain    Volume overload 10/24/2020   Ischemic stroke of frontal lobe (HCC) 10/23/2020   Tremors of nervous system 10/23/2020    Acute respiratory failure with hypoxia (HCC) 10/21/2020   Hypertensive urgency 10/21/2020   Pain in joint of right hip 02/18/2020   Pain of both hip joints 02/18/2020   Neurogenic claudication 11/13/2019   Spondylolisthesis of lumbar region 05/12/2018   Degeneration of lumbar intervertebral disc 05/12/2018   Lumbar pain 05/12/2018   Spinal stenosis of lumbar region 05/12/2018   Essential hypertension 08/25/2014   Dyspnea 08/25/2014   Morbid obesity (HCC) 08/25/2014   COPD (chronic obstructive pulmonary disease) (HCC) 08/25/2014   PCP:  Arloa [name], MD Pharmacy:   [facility] [phone] - [place], [place] - [address] AT [facility] [address] [place] [address] Phone: [phone] Fax: [phone]  [facility] [address] [place] [address] Phone: [phone] Fax: [phone]     Social Drivers of Health (SDOH) Social History: SDOH Screenings   Food Insecurity: No Food Insecurity (09/02/2024)  Housing: Low Risk (09/02/2024)  Transportation Needs: No Transportation Needs (09/02/2024)  Utilities: Not At Risk (09/02/2024)  Social Connections: Moderately Isolated (09/02/2024)  Tobacco Use: Medium Risk (09/03/2024)   SDOH Interventions:     Readmission Risk Interventions     No data to display

## 2024-09-03 NOTE — Progress Notes (Addendum)
 " PROGRESS NOTE    [name]  FMW:[id number] DOB: 03-19-1933 DOA: 09/01/2024 PCP: Arloa [name], MD  Chief Complaint  Patient presents with   Leg Injury    Brief Narrative:   89 yo with hx HFpEF, COPD on 2 L, HTN, dyslipidemia, hx CVA who presented with general malaise and [name] fall.  She complained of generalized weakness.  She'd been treated with amoxicillin for cellulitis outpatient.  Diuretics also adjusted for concern for HF.    Assessment & Plan:   Principal Problem:   Acute on chronic diastolic CHF (congestive heart failure) (HCC) Active Problems:   Essential hypertension   COPD (chronic obstructive pulmonary disease) (HCC)   Body mass index (BMI) 37.0-37.9, adult   Chronic respiratory failure with hypoxia and hypercapnia (HCC)   History of stroke   Cellulitis  Volume Overload Acute on chronic diastolic HF  HRmrEF Echo 08/2023 with EF 50-55%, possible apical/apical septal hypokinesis CXR with cardiomegaly, atelectasis L lung base Albumin 3.5, UA without protein Troponin mildly elevated, but flat Pro BNP 3195 (no priors for comparison) + LE edema on exam Repeat echo with mildly decreased function EF 40-45% - grade 1 diastolic dysfunction Will continue lasix  daily (adjust prn) Holding SGLT2 inhibitor for now RLE US  negative for DVT Strict I/O, daily weights (she notes her dry weight is close to 198 lbs - this is consistent with he weight from her 05/2024 cardiology visit)   Bilateral LE Cellulitis Suspect she has bilateral venous stasis changes - she notes improvement since admission in LE redness, possible superimposed cellulitis, but no fever, leukocytosis, or TTP on my exam - will try to keep abx course as short as possible.  Nonpurulent.  Plain films only with diffuse edema  Chronic Hypoxic Resp Failure COPD on 2 L  Respiratory Acidosis Currently requiring 4 L by [place] continue scheduled duonebs with prn albuterol  - no wheezing today on my exam Compensated  respiratory acidosis, she declined bipap  Asymptomatic Bacteruria 40,000 morganella morganii Seems asymptomatic Follow sensitivities  Hypertension Metoprolol   Hx CVA  NSTEMI Aspirin , statin  Prediabetes SGLT2 inhibitor on hold  Possible d/c tomorrow pending her weight  She's on her own on the weekend - this should be considered before discharge - especially in setting of the inclement whether (will be difficult for folks to check in like they usually do).  Sister particularly concerned about [name] discharge tmrw.    DVT prophylaxis: heparin  Code Status: DNR Family Communication: discussed with sister over phone Disposition:   Status is: Observation The patient remains OBS appropriate and will d/c before 2 midnights.   Consultants:  none  Procedures:  none  Antimicrobials:  Anti-infectives (From admission, onward)    Start     Dose/Rate Route Frequency Ordered Stop   09/03/24 2200  cefadroxil  (DURICEF) capsule 500 mg        500 mg Oral 2 times daily 09/03/24 1418 09/07/24 0959   09/02/24 0845  ceFAZolin  (ANCEF ) IVPB 2g/100 mL premix  Status:  Discontinued        2 g 200 mL/hr over 30 Minutes Intravenous Every 8 hours 09/02/24 0844 09/03/24 1415   09/01/24 2200  linezolid  (ZYVOX ) IVPB 600 mg  Status:  Discontinued        600 mg 300 mL/hr over 60 Minutes Intravenous Every 12 hours 09/01/24 2134 09/02/24 0843       Subjective: No new complaints Dry weight is 198  Objective: Vitals:   09/03/24 0728 09/03/24 1020 09/03/24 1303  09/03/24 1343  BP:  137/60 (!) 128/50   Pulse:  76 73   Resp:   15   Temp:   97.8 F (36.6 C)   TempSrc:      SpO2: 96% 96% 96% 94%  Weight:      Height:        Intake/Output Summary (Last 24 hours) at 09/03/2024 1705 Last data filed at 09/03/2024 1400 Gross per 24 hour  Intake 780 ml  Output 1550 ml  Net -770 ml   Filed Weights   09/02/24 1635 09/02/24 2100 09/03/24 0727  Weight: 93.7 kg 93.7 kg 92.4 kg     Examination:  General: No acute distress. Cardiovascular: RRR Lungs: unlabored, on 3 L - diminished due to body habitus Neurological: Alert and oriented 3. Moves all extremities 4 with equal strength. Cranial nerves II through XII grossly intact. Extremities: continued LE erythema - not TTP - LE edema    Data Reviewed: I have personally reviewed following labs and imaging studies  CBC: Recent Labs  Lab 09/01/24 1701 09/02/24 0542 09/03/24 0525  WBC 8.7 8.7 8.4  NEUTROABS 6.1  --  5.3  HGB 14.0 13.0 13.4  HCT 45.8 42.4 45.4  MCV 97.9 98.1 98.9  PLT 151 131* 142*    Basic Metabolic Panel: Recent Labs  Lab 09/01/24 1702 09/02/24 0542 09/03/24 0525  NA 142 141 137  K 4.1 3.8 3.8  CL 95* 96* 93*  CO2 39* 42* 40*  GLUCOSE 105* 108* 102*  BUN 17 13 11   CREATININE 0.82 0.85 0.82  CALCIUM  9.2 8.9 9.1  MG  --  2.4 2.3  PHOS  --  4.3 3.6    GFR: Estimated Creatinine Clearance: 46.2 mL/min (by C-G formula based on SCr of 0.82 mg/dL).  Liver Function Tests: Recent Labs  Lab 09/02/24 0542 09/03/24 0525  AST 26  25 24   ALT 12  12 7   ALKPHOS 43  43 45  BILITOT 0.3  0.4 0.4  PROT 6.3*  6.4* 6.6  ALBUMIN 3.5  3.6 3.6    CBG: No results for input(s): GLUCAP in the last 168 hours.   Recent Results (from the past 240 hours)  Surgical pcr screen     Status: None   Collection Time: 09/01/24 10:08 PM  Result Value Ref Range Status   MRSA, PCR NEGATIVE NEGATIVE Final   Staphylococcus aureus NEGATIVE NEGATIVE Final    Comment: (NOTE) The Xpert SA Assay (FDA approved for NASAL specimens in patients 37 years of age and older), is one component of [name] comprehensive surveillance program. It is not intended to diagnose infection nor to guide or monitor treatment. Performed at [facility], [address], [place], [place] [address]   Urine Culture (for pregnant, neutropenic or urologic patients or patients with an indwelling urinary  catheter)     Status: Abnormal (Preliminary result)   Collection Time: 09/01/24 11:40 PM   Specimen: Urine, Clean Catch  Result Value Ref Range Status   Specimen Description   Final    URINE, CLEAN CATCH Performed at [facility], [address], [place], [place] [address]    Special Requests   Final    NONE Performed at [facility], [address] [place], [place] [address]    Culture 40,000 COLONIES/mL [facility] MORGANII ([employer])  Final   Report Status PENDING  Incomplete         Radiology Studies: ECHOCARDIOGRAM COMPLETE Result Date: 09/02/2024  ECHOCARDIOGRAM REPORT   Patient Name:   ALOMA BOCH Date of Exam: 09/02/2024 Medical Rec #:  [id number]     Height:       59.0 in Accession #:    [id number]    Weight:       199.4 lb Date of Birth:  Apr 18, 1933     BSA:          1.842 m Patient Age:    89 years      BP:           71/55 mmHg Patient Gender: F             HR:           73 bpm. Exam Location:  Inpatient Procedure: 2D Echo, Cardiac Doppler, Color Doppler and Intracardiac            Opacification Agent (Both Spectral and Color Flow Doppler were            utilized during procedure). Indications:    Chest pain  History:        Patient has prior history of Echocardiogram examinations, most                 recent 08/22/2023.  Sonographer:    Odella Brewster Referring Phys: VERGIE ANASTASSIA DOUTOVA IMPRESSIONS  1. Left ventricular ejection fraction, by estimation, is 40 to 45%. The left ventricle has mildly decreased function. The left ventricle demonstrates global hypokinesis. There is mild concentric left ventricular hypertrophy. Left ventricular diastolic parameters are consistent with Grade I diastolic dysfunction (impaired relaxation).  2. Right ventricular systolic function is normal. The right ventricular size is normal. Tricuspid regurgitation signal is inadequate for assessing PA pressure.  3. The mitral valve is degenerative. No evidence of mitral  valve regurgitation. At risk for mitral stenosis mitral stenosis. The mean mitral valve gradient is 3.0 mmHg.  4. The aortic valve is normal in structure. Aortic valve regurgitation is not visualized. No aortic stenosis is present. FINDINGS  Left Ventricle: Left ventricular ejection fraction, by estimation, is 40 to 45%. The left ventricle has mildly decreased function. The left ventricle demonstrates global hypokinesis. The left ventricular internal cavity size was normal in size. There is  mild concentric left ventricular hypertrophy. Abnormal (paradoxical) septal motion consistent with post-operative status. Left ventricular diastolic parameters are consistent with Grade I diastolic dysfunction (impaired relaxation). Right Ventricle: The right ventricular size is normal. No increase in right ventricular wall thickness. Right ventricular systolic function is normal. Tricuspid regurgitation signal is inadequate for assessing PA pressure. Left Atrium: Left atrial size was normal in size. Right Atrium: Right atrial size was normal in size. Pericardium: There is no evidence of pericardial effusion. Presence of epicardial fat layer. Mitral Valve: The mitral valve is degenerative in appearance. No evidence of mitral valve regurgitation. At risk for mitral stenosis mitral valve stenosis. MV peak gradient, 6.6 mmHg. The mean mitral valve gradient is 3.0 mmHg. Tricuspid Valve: The tricuspid valve is normal in structure. Tricuspid valve regurgitation is trivial. No evidence of tricuspid stenosis. Aortic Valve: The aortic valve is normal in structure. Aortic valve regurgitation is not visualized. No aortic stenosis is present. Aortic valve mean gradient measures 4.0 mmHg. Aortic valve peak gradient measures 8.1 mmHg. Aortic valve area, by VTI measures 1.97 cm. Pulmonic Valve: The pulmonic valve was not well visualized. Pulmonic valve regurgitation is not visualized. No evidence of pulmonic stenosis. Aorta: The aortic root  and ascending aorta are structurally normal, with no  evidence of dilitation. IAS/Shunts: No atrial level shunt detected by color flow Doppler.  LEFT VENTRICLE PLAX 2D LVIDd:         4.40 cm      Diastology LVIDs:         3.30 cm      LV e' medial:    4.79 cm/s LV PW:         0.90 cm      LV E/e' medial:  15.5 LV IVS:        1.30 cm      LV e' lateral:   4.79 cm/s LVOT diam:     2.00 cm      LV E/e' lateral: 15.5 LV SV:         52 LV SV Index:   28 LVOT Area:     3.14 cm LV IVRT:       100 msec  LV Volumes (MOD) LV vol d, MOD A2C: 107.0 ml LV vol d, MOD A4C: 93.2 ml LV vol s, MOD A2C: 61.2 ml LV vol s, MOD A4C: 52.7 ml LV SV MOD A2C:     45.8 ml LV SV MOD A4C:     93.2 ml LV SV MOD BP:      43.6 ml RIGHT VENTRICLE            IVC RV S prime:     9.36 cm/s  IVC diam: 1.80 cm TAPSE (M-mode): 2.1 cm LEFT ATRIUM             Index LA diam:        4.00 cm 2.17 cm/m LA Vol (A2C):   59.0 ml 32.03 ml/m LA Vol (A4C):   40.7 ml 22.10 ml/m LA Biplane Vol: 51.7 ml 28.07 ml/m  AORTIC VALVE                    PULMONIC VALVE AV Area (Vmax):    1.66 cm     PV Vmax:       0.96 m/s AV Area (Vmean):   1.76 cm     PV Peak grad:  3.7 mmHg AV Area (VTI):     1.97 cm AV Vmax:           142.00 cm/s AV Vmean:          95.600 cm/s AV VTI:            0.262 m AV Peak Grad:      8.1 mmHg AV Mean Grad:      4.0 mmHg LVOT Vmax:         75.10 cm/s LVOT Vmean:        53.600 cm/s LVOT VTI:          0.164 m LVOT/AV VTI ratio: 0.63  AORTA Ao Root diam: 3.10 cm MITRAL VALVE MV Area (PHT): 4.12 cm     SHUNTS MV Area VTI:   2.06 cm     Systemic VTI:  0.16 m MV Peak grad:  6.6 mmHg     Systemic Diam: 2.00 cm MV Mean grad:  3.0 mmHg MV Vmax:       1.28 m/s MV Vmean:      83.4 cm/s MV Decel Time: 184 msec MV E velocity: 74.10 cm/s MV Keithen Capo velocity: 113.00 cm/s MV E/Carmesha Morocco ratio:  0.66 Kardie Tobb DO Electronically signed by Dub Huntsman DO Signature Date/Time: 09/02/2024/2:41:36 PM    Final    DG Tibia/Fibula Right Result Date: 09/01/2024 EXAM: 3  VIEW(S)  XRAY OF THE KNEE 09/01/2024 08:24:00 PM COMPARISON: None available. CLINICAL HISTORY: Cellulitis to both tib fibs FINDINGS: BONES AND JOINTS: No acute fracture. No malalignment. Degenerative changes of the knee. SOFT TISSUES: Moderate diffuse subcutaneous soft tissue edema. Vascular calcification. IMPRESSION: 1. Moderate diffuse subcutaneous soft tissue edema. No acute osseous abnormality. Electronically signed by: Pinkie Pebbles MD 09/01/2024 08:33 PM EST RP Workstation: [id number]   DG Tibia/Fibula Left Result Date: 09/01/2024 EXAM: 1 VIEW XRAY OF THE LEFT TIBIA AND FIBULA 09/01/2024 08:24:00 PM COMPARISON: None available. CLINICAL HISTORY: Cellulitis to both tib fibs FINDINGS: BONES AND JOINTS: No acute fracture. No malalignment. SOFT TISSUES: Mild diffuse soft tissue edema. IMPRESSION: 1. Mild diffuse soft tissue edema. No acute osseous abnormality. Electronically signed by: Pinkie Pebbles MD 09/01/2024 08:31 PM EST RP Workstation: [id number]        Scheduled Meds:  aspirin  EC  81 mg Oral Daily   budesonide   0.25 mg Nebulization BID   cefadroxil   500 mg Oral BID   furosemide   40 mg Intravenous BID   guaiFENesin   600 mg Oral BID   heparin   5,000 Units Subcutaneous Q8H   ipratropium-albuterol   3 mL Inhalation TID   metoprolol  succinate  50 mg Oral Daily   rosuvastatin   40 mg Oral Daily   sodium chloride  flush  3 mL Intravenous Q12H   Continuous Infusions:     LOS: 1 day    Time spent: over 30 min     Meliton Monte, MD [facility]   To contact the attending provider between 7A-7P or the covering provider during after hours 7P-7A, please log into the web site [email or web address] and access using universal [place] password for that web site. If you do not have the password, please call the hospital operator.  09/03/2024, 5:05 PM    "

## 2024-09-04 DIAGNOSIS — I5033 Acute on chronic diastolic (congestive) heart failure: Secondary | ICD-10-CM | POA: Diagnosis not present

## 2024-09-04 LAB — COMPREHENSIVE METABOLIC PANEL WITH GFR
ALT: <5 U/L (ref 0–44)
AST: 22 U/L (ref 15–41)
Albumin: 3.6 g/dL (ref 3.5–5.0)
Alkaline Phosphatase: 48 U/L (ref 38–126)
Anion gap: 5 (ref 5–15)
BUN: 11 mg/dL (ref 8–23)
CO2: 39 mmol/L — ABNORMAL HIGH (ref 22–32)
Calcium: 9.3 mg/dL (ref 8.9–10.3)
Chloride: 93 mmol/L — ABNORMAL LOW (ref 98–111)
Creatinine, Ser: 0.81 mg/dL (ref 0.44–1.00)
GFR, Estimated: >60 mL/min
Glucose, Bld: 104 mg/dL — ABNORMAL HIGH (ref 70–99)
Potassium: 4.3 mmol/L (ref 3.5–5.1)
Sodium: 137 mmol/L (ref 135–145)
Total Bilirubin: 0.4 mg/dL (ref 0.0–1.2)
Total Protein: 6.7 g/dL (ref 6.5–8.1)

## 2024-09-04 LAB — CBC WITH DIFFERENTIAL/PLATELET
Abs Immature Granulocytes: 0.02 K/uL (ref 0.00–0.07)
Basophils Absolute: 0 K/uL (ref 0.0–0.1)
Basophils Relative: 1 %
Eosinophils Absolute: 0.2 K/uL (ref 0.0–0.5)
Eosinophils Relative: 2 %
HCT: 46.5 % — ABNORMAL HIGH (ref 36.0–46.0)
Hemoglobin: 13.7 g/dL (ref 12.0–15.0)
Immature Granulocytes: 0 %
Lymphocytes Relative: 23 %
Lymphs Abs: 1.7 K/uL (ref 0.7–4.0)
MCH: 29.1 pg (ref 26.0–34.0)
MCHC: 29.5 g/dL — ABNORMAL LOW (ref 30.0–36.0)
MCV: 98.7 fL (ref 80.0–100.0)
Monocytes Absolute: 0.7 K/uL (ref 0.1–1.0)
Monocytes Relative: 10 %
Neutro Abs: 4.8 K/uL (ref 1.7–7.7)
Neutrophils Relative %: 64 %
Platelets: 145 K/uL — ABNORMAL LOW (ref 150–400)
RBC: 4.71 MIL/uL (ref 3.87–5.11)
RDW: 13.8 % (ref 11.5–15.5)
WBC: 7.5 K/uL (ref 4.0–10.5)
nRBC: 0 % (ref 0.0–0.2)

## 2024-09-04 LAB — URINE CULTURE: Culture: 40000 — AB

## 2024-09-04 LAB — PHOSPHORUS: Phosphorus: 4.2 mg/dL (ref 2.5–4.6)

## 2024-09-04 LAB — MAGNESIUM: Magnesium: 2.6 mg/dL — ABNORMAL HIGH (ref 1.7–2.4)

## 2024-09-04 NOTE — Progress Notes (Signed)
 PT Cancellation Note  Patient Details Name: [name] MRN: [id number] DOB: [birth year]   Cancelled Treatment:    Reason Eval/Treat Not Completed: Fatigue/lethargy limiting ability to participate. PT arrived 77 and pt reported feeling fatigued from transfer with staff bed <>BSC and stated she was now clean and comfortable and declined participation with therapy. PT to continue to follow acutely.   [place], PT Acute Rehab   [place] VEAR Drone 09/04/2024, 3:54 PM

## 2024-09-04 NOTE — TOC Progression Note (Signed)
 Transition of Care [facility]) - Progression Note    Patient Details  Name: [name] MRN: [id number] Date of Birth: 1933-02-16  Transition of Care [facility] CM/SW Contact  Rakel Junio, Nathanel, RN Phone Number: 09/04/2024, 11:50 AM  Clinical Narrative:   [facility] accepted for HHPT/OT. May need PTAR. Has home 02-travel tank if able to provide own transport home.    Expected Discharge Plan: Home w Home Health Services Barriers to Discharge: Continued Medical Work up               Expected Discharge Plan and Services   Discharge Planning Services: CM Consult Post Acute Care Choice: Home Health Living arrangements for the past 2 months: Single Family Home                           HH Arranged: PT, OT HH Agency: [facility] Date HH Agency Contacted: 09/04/24 Time HH Agency Contacted: 1149 Representative spoke with at [facility] Agency: [facility]   Social Drivers of Health (SDOH) Interventions SDOH Screenings   Food Insecurity: No Food Insecurity (09/02/2024)  Housing: Low Risk (09/02/2024)  Transportation Needs: No Transportation Needs (09/02/2024)  Utilities: Not At Risk (09/02/2024)  Social Connections: Moderately Isolated (09/02/2024)  Tobacco Use: Medium Risk (09/03/2024)    Readmission Risk Interventions     No data to display

## 2024-09-04 NOTE — Plan of Care (Signed)

## 2024-09-04 NOTE — Progress Notes (Signed)
" °   09/04/24 2322  BiPAP/CPAP/SIPAP  BiPAP/CPAP/SIPAP Pt Type Adult  BiPAP/CPAP/SIPAP DREAMSTATIOND  Mask Type Full face mask  Dentures removed? Not applicable  Mask Size Medium  Flow Rate 2 lpm  Patient Home Machine No  Patient Home Mask No  Patient Home Tubing No  Auto Titrate Yes  Minimum cmH2O 5 cmH2O  Maximum cmH2O 15 cmH2O    "

## 2024-09-04 NOTE — Progress Notes (Signed)
 PT Cancellation Note  Patient Details Name: [name] MRN: [id number] DOB: Mar 02, 1933   Cancelled Treatment:    Reason Eval/Treat Not Completed: Other (comment). PT arrived 32 and respiratory therapist present. PT to return as schedule allows. PT to continue to follow acutely.   [place], PT Acute Rehab   [place] VEAR Drone 09/04/2024, 2:37 PM

## 2024-09-04 NOTE — Progress Notes (Signed)
 Heart Failure Navigator Progress Note  Assessed for Heart & Vascular TOC clinic readiness.  Patient does not meet criteria due to scheduled [facility] appt 2/11.   Navigator available for reassessment of patient.   Duwaine Plant, PharmD, BCPS Heart Failure Stewardship Pharmacist Phone [phone]

## 2024-09-04 NOTE — Progress Notes (Signed)
 " PROGRESS NOTE    [name]  FMW:[id number] DOB: 1933-06-29 DOA: 09/01/2024 PCP: Arloa [name], MD  Chief Complaint  Patient presents with   Leg Injury    Brief Narrative:   89 yo with hx HFpEF, COPD on 2 L, HTN, dyslipidemia, hx CVA who presented with general malaise and Tomas Schamp fall.  She complained of generalized weakness.  She'd been treated with amoxicillin for cellulitis outpatient.  Diuretics also adjusted for concern for HF.    Assessment & Plan:   Principal Problem:   Acute on chronic diastolic CHF (congestive heart failure) (HCC) Active Problems:   Essential hypertension   COPD (chronic obstructive pulmonary disease) (HCC)   Body mass index (BMI) 37.0-37.9, adult   Chronic respiratory failure with hypoxia and hypercapnia (HCC)   History of stroke   Cellulitis  Volume Overload Acute on chronic diastolic HF  HRmrEF Echo 08/2023 with EF 50-55%, possible apical/apical septal hypokinesis CXR with cardiomegaly, atelectasis L lung base Albumin 3.5, UA without protein Troponin mildly elevated, but flat Pro BNP 3195 (no priors for comparison) + LE edema on exam Repeat echo with mildly decreased function EF 40-45% - grade 1 diastolic dysfunction Will continue lasix  daily (adjust prn) Holding SGLT2 inhibitor for now RLE US  negative for DVT Strict I/O, daily weights (she notes her dry weight is close to 198 lbs - this is consistent with he weight from her 05/2024 cardiology visit)   Bilateral LE Cellulitis Suspect she has bilateral venous stasis changes - she notes improvement since admission in LE redness, possible superimposed cellulitis, but no fever, leukocytosis, or TTP on my exam - will try to keep abx course as short as possible.  Nonpurulent.  Plain films only with diffuse edema  Chronic Hypoxic Resp Failure COPD on 2 L  Respiratory Acidosis Currently requiring 2-4 L by [place] continue scheduled duonebs with prn albuterol  - no wheezing today on my exam Compensated  respiratory acidosis, she declined bipap  Asymptomatic Bacteruria 40,000 morganella morganii Seems asymptomatic Follow sensitivities  Hypertension Metoprolol   Hx CVA  NSTEMI Aspirin , statin  Prediabetes SGLT2 inhibitor on hold  She's on her own on the weekend - this should be considered before discharge - especially in setting of the inclement whether (will be difficult for folks to check in like they usually do).  Sister particularly concerned about Dalaney Needle discharge over the wknd.  {Tip this will not be part of the note when signed Body mass index is 41.14 kg/m. , ,  (Optional):26781}  DVT prophylaxis: heparin  Code Status: DNR Family Communication: discussed with sister over phone Disposition:   Status is: Observation The patient remains OBS appropriate and will d/c before 2 midnights.   Consultants:  none  Procedures:  none  Antimicrobials:  Anti-infectives (From admission, onward)    Start     Dose/Rate Route Frequency Ordered Stop   09/03/24 2200  cefadroxil  (DURICEF) capsule 500 mg        500 mg Oral 2 times daily 09/03/24 1418 09/07/24 0959   09/02/24 0845  ceFAZolin  (ANCEF ) IVPB 2g/100 mL premix  Status:  Discontinued        2 g 200 mL/hr over 30 Minutes Intravenous Every 8 hours 09/02/24 0844 09/03/24 1415   09/01/24 2200  linezolid  (ZYVOX ) IVPB 600 mg  Status:  Discontinued        600 mg 300 mL/hr over 60 Minutes Intravenous Every 12 hours 09/01/24 2134 09/02/24 0843       Subjective:   No  new complaints Feeling better Limited help at home over the wknd  Objective: Vitals:   09/04/24 0721 09/04/24 1200 09/04/24 1419 09/04/24 1422  BP:   (!) 126/58   Pulse:   75   Resp:   20   Temp:   98.1 F (36.7 C)   TempSrc:      SpO2: 93% 97% 91% 94%  Weight:      Height:        Intake/Output Summary (Last 24 hours) at 09/04/2024 1501 Last data filed at 09/04/2024 0900 Gross per 24 hour  Intake --  Output 1900 ml  Net -1900 ml   Filed Weights    09/02/24 1635 09/02/24 2100 09/03/24 0727  Weight: 93.7 kg 93.7 kg 92.4 kg    Examination:  General: No acute distress. Cardiovascular: RRR Lungs: unlabored - diminished anterior exam Neurological: Alert and oriented 3. Moves all extremities 4 with equal strength. Cranial nerves II through XII grossly intact. Extremities: improving LE edema - continued erythema - no TTP   Data Reviewed: I have personally reviewed following labs and imaging studies  CBC: Recent Labs  Lab 09/01/24 1701 09/02/24 0542 09/03/24 0525 09/04/24 0512  WBC 8.7 8.7 8.4 7.5  NEUTROABS 6.1  --  5.3 4.8  HGB 14.0 13.0 13.4 13.7  HCT 45.8 42.4 45.4 46.5*  MCV 97.9 98.1 98.9 98.7  PLT 151 131* 142* 145*    Basic Metabolic Panel: Recent Labs  Lab 09/01/24 1702 09/02/24 0542 09/03/24 0525 09/04/24 0512  NA 142 141 137 137  K 4.1 3.8 3.8 4.3  CL 95* 96* 93* 93*  CO2 39* 42* 40* 39*  GLUCOSE 105* 108* 102* 104*  BUN 17 13 11 11   CREATININE 0.82 0.85 0.82 0.81  CALCIUM  9.2 8.9 9.1 9.3  MG  --  2.4 2.3 2.6*  PHOS  --  4.3 3.6 4.2    GFR: Estimated Creatinine Clearance: 46.8 mL/min (by C-G formula based on SCr of 0.81 mg/dL).  Liver Function Tests: Recent Labs  Lab 09/02/24 0542 09/03/24 0525 09/04/24 0512  AST 26  25 24 22   ALT 12  12 7  <5  ALKPHOS 43  43 45 48  BILITOT 0.3  0.4 0.4 0.4  PROT 6.3*  6.4* 6.6 6.7  ALBUMIN 3.5  3.6 3.6 3.6    CBG: No results for input(s): GLUCAP in the last 168 hours.   Recent Results (from the past 240 hours)  Surgical pcr screen     Status: None   Collection Time: 09/01/24 10:08 PM  Result Value Ref Range Status   MRSA, PCR NEGATIVE NEGATIVE Final   Staphylococcus aureus NEGATIVE NEGATIVE Final    Comment: (NOTE) The Xpert SA Assay (FDA approved for NASAL specimens in patients 13 years of age and older), is one component of Dryden Tapley comprehensive surveillance program. It is not intended to diagnose infection nor to guide or monitor  treatment. Performed at [facility], [address], [place], [place] [address]   Urine Culture (for pregnant, neutropenic or urologic patients or patients with an indwelling urinary catheter)     Status: Abnormal   Collection Time: 09/01/24 11:40 PM   Specimen: Urine, Clean Catch  Result Value Ref Range Status   Specimen Description   Final    URINE, CLEAN CATCH Performed at [facility], [address] [place], [place] [address]    Special Requests   Final    NONE Performed at [facility], [address] [place], [place] [address]    Culture 40,000 COLONIES/mL [facility] MORGANII (Sheana Bir)  Final   Report Status 09/04/2024 FINAL  Final   Organism ID, Bacteria MORGANELLA MORGANII (Mee Macdonnell)  Final      Susceptibility   Morganella morganii - MIC*    AMPICILLIN >=32 RESISTANT Resistant     ERTAPENEM <=0.12 SENSITIVE Sensitive     CIPROFLOXACIN <=0.06 SENSITIVE Sensitive     GENTAMICIN <=1 SENSITIVE Sensitive     NITROFURANTOIN 128 RESISTANT Resistant     TRIMETH/SULFA <=20 SENSITIVE Sensitive     AMPICILLIN/SULBACTAM >=32 RESISTANT Resistant     PIP/TAZO Value in next row Sensitive      <=4 SENSITIVEThis is Lyon Dumont modified FDA-approved test that has been validated and its performance characteristics determined by the reporting laboratory.  This laboratory is certified under the Clinical Laboratory Improvement Amendments CLIA as qualified to perform high complexity clinical laboratory testing.    MEROPENEM Value in next row Sensitive      <=4 SENSITIVEThis is Dakotah Orrego modified FDA-approved test that has been validated and its performance characteristics determined by the reporting laboratory.  This laboratory is certified under the Clinical Laboratory Improvement Amendments CLIA as qualified to perform high complexity clinical laboratory testing.    * 40,000 COLONIES/mL [facility] MORGANII         Radiology Studies: No results  found.       Scheduled Meds:  aspirin  EC  81 mg Oral Daily   budesonide   0.25 mg Nebulization BID   cefadroxil   500 mg Oral BID   furosemide   40 mg Intravenous BID   guaiFENesin   600 mg Oral BID   heparin   5,000 Units Subcutaneous Q8H   ipratropium-albuterol   3 mL Inhalation TID   metoprolol  succinate  50 mg Oral Daily   rosuvastatin   40 mg Oral Daily   sodium chloride  flush  3 mL Intravenous Q12H   Continuous Infusions:     LOS: 2 days    Time spent: over 30 min     Meliton Monte, MD [facility]   To contact the attending provider between 7A-7P or the covering provider during after hours 7P-7A, please log into the web site [email or web address] and access using universal [place] password for that web site. If you do not have the password, please call the hospital operator.  09/04/2024, 3:01 PM    "

## 2024-09-05 DIAGNOSIS — I5033 Acute on chronic diastolic (congestive) heart failure: Secondary | ICD-10-CM | POA: Diagnosis not present

## 2024-09-05 LAB — CBC WITH DIFFERENTIAL/PLATELET
Abs Immature Granulocytes: 0.03 10*3/uL (ref 0.00–0.07)
Basophils Absolute: 0 10*3/uL (ref 0.0–0.1)
Basophils Relative: 1 %
Eosinophils Absolute: 0.2 10*3/uL (ref 0.0–0.5)
Eosinophils Relative: 3 %
HCT: 45.2 % (ref 36.0–46.0)
Hemoglobin: 13.5 g/dL (ref 12.0–15.0)
Immature Granulocytes: 0 %
Lymphocytes Relative: 25 %
Lymphs Abs: 1.9 10*3/uL (ref 0.7–4.0)
MCH: 29.5 pg (ref 26.0–34.0)
MCHC: 29.9 g/dL — ABNORMAL LOW (ref 30.0–36.0)
MCV: 98.7 fL (ref 80.0–100.0)
Monocytes Absolute: 0.8 10*3/uL (ref 0.1–1.0)
Monocytes Relative: 10 %
Neutro Abs: 4.8 10*3/uL (ref 1.7–7.7)
Neutrophils Relative %: 61 %
Platelets: 136 10*3/uL — ABNORMAL LOW (ref 150–400)
RBC: 4.58 MIL/uL (ref 3.87–5.11)
RDW: 13.8 % (ref 11.5–15.5)
WBC: 7.8 10*3/uL (ref 4.0–10.5)
nRBC: 0 % (ref 0.0–0.2)

## 2024-09-05 LAB — PHOSPHORUS: Phosphorus: 3.5 mg/dL (ref 2.5–4.6)

## 2024-09-05 LAB — MAGNESIUM: Magnesium: 2.4 mg/dL (ref 1.7–2.4)

## 2024-09-05 LAB — COMPREHENSIVE METABOLIC PANEL WITH GFR
ALT: 5 U/L (ref 0–44)
AST: 23 U/L (ref 15–41)
Albumin: 3.6 g/dL (ref 3.5–5.0)
Alkaline Phosphatase: 50 U/L (ref 38–126)
Anion gap: 4 — ABNORMAL LOW (ref 5–15)
BUN: 10 mg/dL (ref 8–23)
CO2: 43 mmol/L — ABNORMAL HIGH (ref 22–32)
Calcium: 9.2 mg/dL (ref 8.9–10.3)
Chloride: 90 mmol/L — ABNORMAL LOW (ref 98–111)
Creatinine, Ser: 0.79 mg/dL (ref 0.44–1.00)
GFR, Estimated: >60 mL/min
Glucose, Bld: 108 mg/dL — ABNORMAL HIGH (ref 70–99)
Potassium: 4.9 mmol/L (ref 3.5–5.1)
Sodium: 136 mmol/L (ref 135–145)
Total Bilirubin: 0.4 mg/dL (ref 0.0–1.2)
Total Protein: 6.5 g/dL (ref 6.5–8.1)

## 2024-09-05 MED ORDER — FUROSEMIDE 10 MG/ML IJ SOLN
40.0000 mg | Freq: Every day | INTRAMUSCULAR | Status: DC
  Administered 2024-09-06: 40 mg via INTRAVENOUS
  Filled 2024-09-05: qty 4

## 2024-09-05 NOTE — Progress Notes (Signed)
 Physical Therapy Treatment Patient Details Name: [name] MRN: [id number] DOB: 20-Feb-1933 Today's Date: 09/05/2024   History of Present Illness Pt is an 89y.o. female admitted on 09/01/24 from home due to a fall, generalized weakness and malaise. PMH: HFpEF, COPD on 2L, HTN, dyslipidemia, and h/o CVA.    PT Comments  Pt agreeable to working with therapy. O2: 91% on 2L [place] during session. She tolerated session well. She ambulated ~40 feet with use of a rollator on today. Assisted pt back into recliner at her request. Will continue to follow and progress activity as tolerated. Continue to recommend HHPT f/u.     If plan is discharge home, recommend the following: A little help with walking and/or transfers;A little help with bathing/dressing/bathroom;Assistance with cooking/housework;Assist for transportation;Help with stairs or ramp for entrance   Can travel by private vehicle        Equipment Recommendations  None recommended by PT    Recommendations for Other Services       Precautions / Restrictions Precautions Precautions: Fall Precaution/Restrictions Comments: monitor O2 Restrictions Weight Bearing Restrictions Per Provider Order: No     Mobility  Bed Mobility               General bed mobility comments: oob in recliner    Transfers Overall transfer level: Needs assistance Equipment used: Rollator (4 wheels) Transfers: Sit to/from Stand Sit to Stand: Contact guard assist           General transfer comment: Cues for safety, proper operation of rollator.    Ambulation/Gait Ambulation/Gait assistance: Contact guard assist Gait Distance (Feet): 40 Feet Assistive device: Rollator (4 wheels) Gait Pattern/deviations: Step-through pattern, Decreased stride length       General Gait Details: CGA for safety. Tolerated distance well but does report fatigue. Minimal dyspnea. O2 91% on 2L.   Stairs             Wheelchair Mobility     Tilt Bed     Modified Rankin (Stroke Patients Only)       Balance Overall balance assessment: Needs assistance         Standing balance support: Bilateral upper extremity supported, During functional activity, Reliant on assistive device for balance Standing balance-Leahy Scale: Fair                              Hotel Manager: No apparent difficulties  Cognition Arousal: Alert Behavior During Therapy: WFL for tasks assessed/performed                             Following commands: Intact      Cueing Cueing Techniques: Verbal cues  Exercises      General Comments        Pertinent Vitals/Pain Pain Assessment Pain Assessment: No/denies pain    Home Living                          Prior Function            PT Goals (current goals can now be found in the care plan section) Progress towards PT goals: Progressing toward goals    Frequency    Min 2X/week      PT Plan      Co-evaluation              AM-PAC PT 6 Clicks  Mobility   Outcome Measure  Help needed turning from your back to your side while in a flat bed without using bedrails?: A Little Help needed moving from lying on your back to sitting on the side of a flat bed without using bedrails?: A Little Help needed moving to and from a bed to a chair (including a wheelchair)?: A Little Help needed standing up from a chair using your arms (e.g., wheelchair or bedside chair)?: A Little Help needed to walk in hospital room?: A Little Help needed climbing 3-5 steps with a railing? : A Lot 6 Click Score: 17    End of Session Equipment Utilized During Treatment: Gait belt;Oxygen Activity Tolerance: Patient tolerated treatment well Patient left: in chair;with call bell/phone within reach;with chair alarm set   PT Visit Diagnosis: Unsteadiness on feet (R26.81);Muscle weakness (generalized) (M62.81);History of falling (Z91.81)     Time:  8471-8454 PT Time Calculation (min) (ACUTE ONLY): 17 min  Charges:    $Gait Training: 8-22 mins PT General Charges $$ ACUTE PT VISIT: 1 Visit                         Dannial SQUIBB, PT Acute Rehabilitation  Office: [phone]

## 2024-09-05 NOTE — Plan of Care (Signed)

## 2024-09-05 NOTE — Progress Notes (Signed)
 " PROGRESS NOTE    [name]  FMW:[id number] DOB: 1932/08/15 DOA: 09/01/2024 PCP: Linda [name], MD  Chief Complaint  Patient presents with   Leg Injury    Brief Narrative:   89 yo with hx HFpEF, COPD on 2 L, HTN, dyslipidemia, hx CVA who presented with general malaise and [name] fall.  She complained of generalized weakness.  She'd been treated with amoxicillin for cellulitis outpatient.  Diuretics also adjusted for concern for HF.    Assessment & Plan:   Principal Problem:   Acute on chronic diastolic CHF (congestive heart failure) (HCC) Active Problems:   Essential hypertension   COPD (chronic obstructive pulmonary disease) (HCC)   Body mass index (BMI) 37.0-37.9, adult   Chronic respiratory failure with hypoxia and hypercapnia (HCC)   History of stroke   Cellulitis  Volume Overload Acute on chronic diastolic HF  HRmrEF Echo 08/2023 with EF 50-55%, possible apical/apical septal hypokinesis CXR with cardiomegaly, atelectasis L lung base Albumin 3.5, UA without protein Troponin mildly elevated, but flat Pro BNP 3195 (no priors for comparison) + LE edema on exam Repeat echo with mildly decreased function EF 40-45% - grade 1 diastolic dysfunction Will continue lasix  daily (she's at her dry weight today, continue IV lasix  for now) Holding SGLT2 inhibitor for now RLE US  negative for DVT Strict I/O, daily weights (she notes her dry weight is close to 198 lbs - this is consistent with he weight from her 05/2024 cardiology visit)   Bilateral LE Cellulitis Suspect she has bilateral venous stasis changes - she notes improvement since admission in LE redness, possible superimposed cellulitis, but no fever, leukocytosis, or TTP on my exam - will try to keep abx course as short as possible.  Nonpurulent.  Plain films only with diffuse edema  Chronic Hypoxic Resp Failure COPD on 2 L  Respiratory Acidosis Currently requiring 2 L by [place] continue scheduled duonebs with prn albuterol  -  no wheezing today on my exam Compensated respiratory acidosis, she declined bipap  Asymptomatic Bacteruria 40,000 morganella morganii Seems asymptomatic - UA without LE, nitrite Will defer treatment unless issues  Hypertension Metoprolol   Hx CVA  NSTEMI Aspirin , statin  Prediabetes SGLT2 inhibitor on hold  She notes no one is with her on Saturday.  Sister particularly concerned about [name] discharge over the wknd.  I think given her age and support during the week (she has caregiver that comes 4 hrs/day on Weekdays), will try to discharge Monday if possible - discharge over the weekend would be difficult for her and the inclement weather complicates her ability to have folks help her.  Discussed importance of mobility, OOBTC.    DVT prophylaxis: heparin  Code Status: DNR Family Communication: discussed with sister over phone Disposition:   Status is: Observation The patient remains OBS appropriate and will d/c before 2 midnights.   Consultants:  none  Procedures:  none  Antimicrobials:  Anti-infectives (From admission, onward)    Start     Dose/Rate Route Frequency Ordered Stop   09/03/24 2200  cefadroxil  (DURICEF) capsule 500 mg        500 mg Oral 2 times daily 09/03/24 1418 09/07/24 0959   09/02/24 0845  ceFAZolin  (ANCEF ) IVPB 2g/100 mL premix  Status:  Discontinued        2 g 200 mL/hr over 30 Minutes Intravenous Every 8 hours 09/02/24 0844 09/03/24 1415   09/01/24 2200  linezolid  (ZYVOX ) IVPB 600 mg  Status:  Discontinued  600 mg 300 mL/hr over 60 Minutes Intravenous Every 12 hours 09/01/24 2134 09/02/24 0843       Subjective:   Breathing feels back to normal No one to help her at home today  Objective: Vitals:   09/05/24 0443 09/05/24 0500 09/05/24 0746 09/05/24 0945  BP: 103/77   117/61  Pulse: 74   79  Resp: 19     Temp: 98 F (36.7 C)     TempSrc: Oral     SpO2: 93%  93%   Weight:  90.1 kg    Height:        Intake/Output Summary (Last 24  hours) at 09/05/2024 1309 Last data filed at 09/05/2024 1055 Gross per 24 hour  Intake 240 ml  Output 1800 ml  Net -1560 ml   Filed Weights   09/03/24 0727 09/04/24 1500 09/05/24 0500  Weight: 92.4 kg 90.8 kg 90.1 kg    Examination:  General: No acute distress. Cardiovascular:RRR Lungs: unlabored, diminished Abdomen: Soft, nontender, nondistended  Neurological: Alert and oriented 3. Moves all extremities 4 . Cranial nerves II through XII grossly intact. Extremities: continued bilateral LE erythema without TTP - mild edema   Data Reviewed: I have personally reviewed following labs and imaging studies  CBC: Recent Labs  Lab 09/01/24 1701 09/02/24 0542 09/03/24 0525 09/04/24 0512 09/05/24 0456  WBC 8.7 8.7 8.4 7.5 7.8  NEUTROABS 6.1  --  5.3 4.8 4.8  HGB 14.0 13.0 13.4 13.7 13.5  HCT 45.8 42.4 45.4 46.5* 45.2  MCV 97.9 98.1 98.9 98.7 98.7  PLT 151 131* 142* 145* 136*    Basic Metabolic Panel: Recent Labs  Lab 09/01/24 1702 09/02/24 0542 09/03/24 0525 09/04/24 0512 09/05/24 0456  NA 142 141 137 137 136  K 4.1 3.8 3.8 4.3 4.9  CL 95* 96* 93* 93* 90*  CO2 39* 42* 40* 39* 43*  GLUCOSE 105* 108* 102* 104* 108*  BUN 17 13 11 11 10   CREATININE 0.82 0.85 0.82 0.81 0.79  CALCIUM  9.2 8.9 9.1 9.3 9.2  MG  --  2.4 2.3 2.6* 2.4  PHOS  --  4.3 3.6 4.2 3.5    GFR: Estimated Creatinine Clearance: 46.7 mL/min (by C-G formula based on SCr of 0.79 mg/dL).  Liver Function Tests: Recent Labs  Lab 09/02/24 0542 09/03/24 0525 09/04/24 0512 09/05/24 0456  AST 26  25 24 22 23   ALT 12  12 7  <5 5  ALKPHOS 43  43 45 48 50  BILITOT 0.3  0.4 0.4 0.4 0.4  PROT 6.3*  6.4* 6.6 6.7 6.5  ALBUMIN 3.5  3.6 3.6 3.6 3.6    CBG: No results for input(s): GLUCAP in the last 168 hours.   Recent Results (from the past 240 hours)  Surgical pcr screen     Status: None   Collection Time: 09/01/24 10:08 PM  Result Value Ref Range Status   MRSA, PCR NEGATIVE NEGATIVE Final    Staphylococcus aureus NEGATIVE NEGATIVE Final    Comment: (NOTE) The Xpert SA Assay (FDA approved for NASAL specimens in patients 15 years of age and older), is one component of Asaiah Scarber comprehensive surveillance program. It is not intended to diagnose infection nor to guide or monitor treatment. Performed at [facility], [address] [place], [place] [address]   Urine Culture (for pregnant, neutropenic or urologic patients or patients with an indwelling urinary catheter)     Status: Abnormal   Collection Time: 09/01/24 11:40 PM   Specimen:  Urine, Clean Catch  Result Value Ref Range Status   Specimen Description   Final    URINE, CLEAN CATCH Performed at [facility], [address] [place], [place] [address]    Special Requests   Final    NONE Performed at [facility], [address], [place], [place] [address]    Culture 40,000 COLONIES/mL [facility] MORGANII (Watson Robarge)  Final   Report Status 09/04/2024 FINAL  Final   Organism ID, Bacteria MORGANELLA MORGANII (Usbaldo Pannone)  Final      Susceptibility   Morganella morganii - MIC*    AMPICILLIN >=32 RESISTANT Resistant     ERTAPENEM <=0.12 SENSITIVE Sensitive     CIPROFLOXACIN <=0.06 SENSITIVE Sensitive     GENTAMICIN <=1 SENSITIVE Sensitive     NITROFURANTOIN 128 RESISTANT Resistant     TRIMETH/SULFA <=20 SENSITIVE Sensitive     AMPICILLIN/SULBACTAM >=32 RESISTANT Resistant     PIP/TAZO Value in next row Sensitive      <=4 SENSITIVEThis is Analisa Sledd modified FDA-approved test that has been validated and its performance characteristics determined by the reporting laboratory.  This laboratory is certified under the Clinical Laboratory Improvement Amendments CLIA as qualified to perform high complexity clinical laboratory testing.    MEROPENEM Value in next row Sensitive      <=4 SENSITIVEThis is Desmin Daleo modified FDA-approved test that has been validated and its performance characteristics determined by the  reporting laboratory.  This laboratory is certified under the Clinical Laboratory Improvement Amendments CLIA as qualified to perform high complexity clinical laboratory testing.    * 40,000 COLONIES/mL [facility] MORGANII         Radiology Studies: No results found.       Scheduled Meds:  aspirin  EC  81 mg Oral Daily   budesonide   0.25 mg Nebulization BID   cefadroxil   500 mg Oral BID   [START ON 09/06/2024] furosemide   40 mg Intravenous Daily   guaiFENesin   600 mg Oral BID   heparin   5,000 Units Subcutaneous Q8H   ipratropium-albuterol   3 mL Inhalation TID   metoprolol  succinate  50 mg Oral Daily   rosuvastatin   40 mg Oral Daily   sodium chloride  flush  3 mL Intravenous Q12H   Continuous Infusions:     LOS: 3 days    Time spent: over 30 min     Meliton Monte, MD [facility]   To contact the attending provider between 7A-7P or the covering provider during after hours 7P-7A, please log into the web site [email or web address] and access using universal [place] password for that web site. If you do not have the password, please call the hospital operator.  09/05/2024, 1:09 PM    "

## 2024-09-06 DIAGNOSIS — I5033 Acute on chronic diastolic (congestive) heart failure: Secondary | ICD-10-CM | POA: Diagnosis not present

## 2024-09-06 LAB — BASIC METABOLIC PANEL WITH GFR
Anion gap: 4 — ABNORMAL LOW (ref 5–15)
BUN: 11 mg/dL (ref 8–23)
CO2: 40 mmol/L — ABNORMAL HIGH (ref 22–32)
Calcium: 9.6 mg/dL (ref 8.9–10.3)
Chloride: 87 mmol/L — ABNORMAL LOW (ref 98–111)
Creatinine, Ser: 0.84 mg/dL (ref 0.44–1.00)
GFR, Estimated: >60 mL/min
Glucose, Bld: 105 mg/dL — ABNORMAL HIGH (ref 70–99)
Potassium: 4.5 mmol/L (ref 3.5–5.1)
Sodium: 131 mmol/L — ABNORMAL LOW (ref 135–145)

## 2024-09-06 LAB — MAGNESIUM: Magnesium: 2.6 mg/dL — ABNORMAL HIGH (ref 1.7–2.4)

## 2024-09-06 LAB — CBC
HCT: 45.4 % (ref 36.0–46.0)
Hemoglobin: 14.1 g/dL (ref 12.0–15.0)
MCH: 29.5 pg (ref 26.0–34.0)
MCHC: 31.1 g/dL (ref 30.0–36.0)
MCV: 95 fL (ref 80.0–100.0)
Platelets: 130 10*3/uL — ABNORMAL LOW (ref 150–400)
RBC: 4.78 MIL/uL (ref 3.87–5.11)
RDW: 13.6 % (ref 11.5–15.5)
WBC: 7.8 10*3/uL (ref 4.0–10.5)
nRBC: 0 % (ref 0.0–0.2)

## 2024-09-06 LAB — PHOSPHORUS: Phosphorus: 4 mg/dL (ref 2.5–4.6)

## 2024-09-06 MED ORDER — DAPAGLIFLOZIN PROPANEDIOL 10 MG PO TABS
10.0000 mg | ORAL_TABLET | Freq: Every day | ORAL | Status: DC
  Administered 2024-09-07 – 2024-09-10 (×4): 10 mg via ORAL
  Filled 2024-09-06 (×4): qty 1

## 2024-09-06 MED ORDER — FUROSEMIDE 40 MG PO TABS
40.0000 mg | ORAL_TABLET | Freq: Every day | ORAL | Status: DC
  Administered 2024-09-07 – 2024-09-08 (×2): 40 mg via ORAL
  Filled 2024-09-06 (×2): qty 1

## 2024-09-06 NOTE — Progress Notes (Signed)
 " PROGRESS NOTE    [name]  FMW:[id number] DOB: [birth year] DOA: 09/01/2024 PCP: Arloa [name], MD  Chief Complaint  Patient presents with   Leg Injury    Brief Narrative:   89 yo with hx HFpEF, COPD on 2 L, HTN, dyslipidemia, hx CVA who presented with general malaise and [name] fall.  She complained of generalized weakness.  She'd been treated with amoxicillin for cellulitis outpatient.  Diuretics also adjusted for concern for HF.    Assessment & Plan:   Principal Problem:   Acute on chronic diastolic CHF (congestive heart failure) (HCC) Active Problems:   Essential hypertension   COPD (chronic obstructive pulmonary disease) (HCC)   Body mass index (BMI) 37.0-37.9, adult   Chronic respiratory failure with hypoxia and hypercapnia (HCC)   History of stroke   Cellulitis  Volume Overload Acute on chronic diastolic HF  HRmrEF Echo 08/2023 with EF 50-55%, possible apical/apical septal hypokinesis CXR with cardiomegaly, atelectasis L lung base Albumin 3.5, UA without protein Troponin mildly elevated, but flat Pro BNP 3195 (no priors for comparison) + LE edema on exam Repeat echo with mildly decreased function EF 40-45% - grade 1 diastolic dysfunction Will continue lasix  daily (she's at her dry weight today, continue IV lasix  for now) Holding SGLT2 inhibitor for now RLE US  negative for DVT Strict I/O, daily weights - at her dry weight on 1/24, weight pending today (she notes her dry weight is close to 198 lbs - this is consistent with he weight from her 05/2024 cardiology visit)   Bilateral LE Cellulitis Suspect she has bilateral venous stasis changes - she notes improvement since admission in LE redness, possible superimposed cellulitis, but no fever, leukocytosis, or TTP on my exam - will try to keep abx course as short as possible.  Nonpurulent.  Plain films only with diffuse edema  Chronic Hypoxic Resp Failure COPD on 2 L  Respiratory Acidosis Currently requiring 2 L by  Hart continue scheduled duonebs with prn albuterol  - no wheezing today on my exam Compensated respiratory acidosis, she declined bipap  Asymptomatic Bacteruria 40,000 morganella morganii Seems asymptomatic - UA without LE, nitrite Will defer treatment unless issues  Hypertension Metoprolol   Hx CVA  NSTEMI Aspirin , statin  Prediabetes SGLT2 inhibitor on hold  No one is with her on Saturday.  Sister particularly concerned about [name] discharge over the wknd.  I think given her age and support during the week (she has caregiver that comes 4 hrs/day on Weekdays), will try to discharge Monday if possible - discharge over the weekend would be difficult for her and the inclement weather complicates her ability to have folks help her.  Discussed importance of mobility, OOBTC.    DVT prophylaxis: heparin  Code Status: DNR Family Communication: discussed with sister over phone Disposition:   Status is: Observation The patient remains OBS appropriate and will d/c before 2 midnights.   Consultants:  none  Procedures:  none  Antimicrobials:  Anti-infectives (From admission, onward)    Start     Dose/Rate Route Frequency Ordered Stop   09/03/24 2200  cefadroxil  (DURICEF) capsule 500 mg        500 mg Oral 2 times daily 09/03/24 1418 09/07/24 0959   09/02/24 0845  ceFAZolin  (ANCEF ) IVPB 2g/100 mL premix  Status:  Discontinued        2 g 200 mL/hr over 30 Minutes Intravenous Every 8 hours 09/02/24 0844 09/03/24 1415   09/01/24 2200  linezolid  (ZYVOX ) IVPB 600 mg  Status:  Discontinued        600 mg 300 mL/hr over 60 Minutes Intravenous Every 12 hours 09/01/24 2134 09/02/24 0843       Subjective:   No complaints  Objective: Vitals:   09/06/24 0721 09/06/24 0800 09/06/24 0900 09/06/24 1000  BP:      Pulse: 70     Resp:  (!) 26 19 18   Temp:      TempSrc:      SpO2:      Weight:      Height:        Intake/Output Summary (Last 24 hours) at 09/06/2024 1200 Last data filed at  09/06/2024 1000 Gross per 24 hour  Intake 250 ml  Output 1200 ml  Net -950 ml   Filed Weights   09/03/24 0727 09/04/24 1500 09/05/24 0500  Weight: 92.4 kg 90.8 kg 90.1 kg    Examination:  General: No acute distress. Cardiovascular: RRR Lungs: unlabored Neurological: Alert and oriented 3. Moves all extremities 4 with equal strength. Cranial nerves II through XII grossly intact. Extremities: unchanged bilateral LE erythema without TTP - improved edema  Data Reviewed: I have personally reviewed following labs and imaging studies  CBC: Recent Labs  Lab 09/01/24 1701 09/02/24 0542 09/03/24 0525 09/04/24 0512 09/05/24 0456 09/06/24 0528  WBC 8.7 8.7 8.4 7.5 7.8 7.8  NEUTROABS 6.1  --  5.3 4.8 4.8  --   HGB 14.0 13.0 13.4 13.7 13.5 14.1  HCT 45.8 42.4 45.4 46.5* 45.2 45.4  MCV 97.9 98.1 98.9 98.7 98.7 95.0  PLT 151 131* 142* 145* 136* 130*    Basic Metabolic Panel: Recent Labs  Lab 09/02/24 0542 09/03/24 0525 09/04/24 0512 09/05/24 0456 09/06/24 0528  NA 141 137 137 136 131*  K 3.8 3.8 4.3 4.9 4.5  CL 96* 93* 93* 90* 87*  CO2 42* 40* 39* 43* 40*  GLUCOSE 108* 102* 104* 108* 105*  BUN 13 11 11 10 11   CREATININE 0.85 0.82 0.81 0.79 0.84  CALCIUM  8.9 9.1 9.3 9.2 9.6  MG 2.4 2.3 2.6* 2.4 2.6*  PHOS 4.3 3.6 4.2 3.5 4.0    GFR: Estimated Creatinine Clearance: 44.4 mL/min (by C-G formula based on SCr of 0.84 mg/dL).  Liver Function Tests: Recent Labs  Lab 09/02/24 0542 09/03/24 0525 09/04/24 0512 09/05/24 0456  AST 26  25 24 22 23   ALT 12  12 7  <5 5  ALKPHOS 43  43 45 48 50  BILITOT 0.3  0.4 0.4 0.4 0.4  PROT 6.3*  6.4* 6.6 6.7 6.5  ALBUMIN 3.5  3.6 3.6 3.6 3.6    CBG: No results for input(s): GLUCAP in the last 168 hours.   Recent Results (from the past 240 hours)  Surgical pcr screen     Status: None   Collection Time: 09/01/24 10:08 PM  Result Value Ref Range Status   MRSA, PCR NEGATIVE NEGATIVE Final   Staphylococcus aureus NEGATIVE  NEGATIVE Final    Comment: (NOTE) The Xpert SA Assay (FDA approved for NASAL specimens in patients 52 years of age and older), is one component of [name] surveillance program. It is not intended to diagnose infection nor to guide or monitor treatment. Performed at [facility], [address], [place], [place] [address]   Urine Culture (for pregnant, neutropenic or urologic patients or patients with an indwelling urinary catheter)     Status: Abnormal   Collection Time: 09/01/24 11:40 PM   Specimen: Urine, Clean  Catch  Result Value Ref Range Status   Specimen Description   Final    URINE, CLEAN CATCH Performed at [facility], [address] [place], [place] [address]    Special Requests   Final    NONE Performed at [facility], [address], [place], [place] [address]    Culture 40,000 COLONIES/mL [facility] MORGANII (Khyra Viscuso)  Final   Report Status 09/04/2024 FINAL  Final   Organism ID, Bacteria MORGANELLA MORGANII (Kody Brandl)  Final      Susceptibility   Morganella morganii - MIC*    AMPICILLIN >=32 RESISTANT Resistant     ERTAPENEM <=0.12 SENSITIVE Sensitive     CIPROFLOXACIN <=0.06 SENSITIVE Sensitive     GENTAMICIN <=1 SENSITIVE Sensitive     NITROFURANTOIN 128 RESISTANT Resistant     TRIMETH/SULFA <=20 SENSITIVE Sensitive     AMPICILLIN/SULBACTAM >=32 RESISTANT Resistant     PIP/TAZO Value in next row Sensitive      <=4 SENSITIVEThis is Myelle Poteat modified FDA-approved test that has been validated and its performance characteristics determined by the reporting laboratory.  This laboratory is certified under the Clinical Laboratory Improvement Amendments CLIA as qualified to perform high complexity clinical laboratory testing.    MEROPENEM Value in next row Sensitive      <=4 SENSITIVEThis is Gayna Braddy modified FDA-approved test that has been validated and its performance characteristics determined by the reporting laboratory.  This  laboratory is certified under the Clinical Laboratory Improvement Amendments CLIA as qualified to perform high complexity clinical laboratory testing.    * 40,000 COLONIES/mL [facility] MORGANII         Radiology Studies: No results found.       Scheduled Meds:  aspirin  EC  81 mg Oral Daily   budesonide   0.25 mg Nebulization BID   cefadroxil   500 mg Oral BID   furosemide   40 mg Intravenous Daily   guaiFENesin   600 mg Oral BID   heparin   5,000 Units Subcutaneous Q8H   ipratropium-albuterol   3 mL Inhalation TID   metoprolol  succinate  50 mg Oral Daily   rosuvastatin   40 mg Oral Daily   sodium chloride  flush  3 mL Intravenous Q12H   Continuous Infusions:     LOS: 4 days    Time spent: over 30 min     Meliton Monte, MD [facility]   To contact the attending provider between 7A-7P or the covering provider during after hours 7P-7A, please log into the web site [email or web address] and access using universal [place] password for that web site. If you do not have the password, please call the hospital operator.  09/06/2024, 12:00 PM    "

## 2024-09-06 NOTE — Plan of Care (Signed)

## 2024-09-07 DIAGNOSIS — I5033 Acute on chronic diastolic (congestive) heart failure: Secondary | ICD-10-CM | POA: Diagnosis not present

## 2024-09-07 LAB — CBC WITH DIFFERENTIAL/PLATELET
Abs Immature Granulocytes: 0.02 10*3/uL (ref 0.00–0.07)
Basophils Absolute: 0 10*3/uL (ref 0.0–0.1)
Basophils Relative: 1 %
Eosinophils Absolute: 0.2 10*3/uL (ref 0.0–0.5)
Eosinophils Relative: 3 %
HCT: 43.5 % (ref 36.0–46.0)
Hemoglobin: 13.8 g/dL (ref 12.0–15.0)
Immature Granulocytes: 0 %
Lymphocytes Relative: 21 %
Lymphs Abs: 1.3 10*3/uL (ref 0.7–4.0)
MCH: 29.7 pg (ref 26.0–34.0)
MCHC: 31.7 g/dL (ref 30.0–36.0)
MCV: 93.5 fL (ref 80.0–100.0)
Monocytes Absolute: 0.5 10*3/uL (ref 0.1–1.0)
Monocytes Relative: 8 %
Neutro Abs: 4.1 10*3/uL (ref 1.7–7.7)
Neutrophils Relative %: 67 %
Platelets: 119 10*3/uL — ABNORMAL LOW (ref 150–400)
RBC: 4.65 MIL/uL (ref 3.87–5.11)
RDW: 13.4 % (ref 11.5–15.5)
WBC: 6 10*3/uL (ref 4.0–10.5)
nRBC: 0 % (ref 0.0–0.2)

## 2024-09-07 LAB — MAGNESIUM: Magnesium: 2.7 mg/dL — ABNORMAL HIGH (ref 1.7–2.4)

## 2024-09-07 LAB — PHOSPHORUS: Phosphorus: 3.5 mg/dL (ref 2.5–4.6)

## 2024-09-07 LAB — URINALYSIS, W/ REFLEX TO CULTURE (INFECTION SUSPECTED)
Bacteria, UA: NONE SEEN
Bilirubin Urine: NEGATIVE
Glucose, UA: >=500 mg/dL — AB
Hgb urine dipstick: NEGATIVE
Ketones, ur: NEGATIVE mg/dL
Nitrite: NEGATIVE
Protein, ur: NEGATIVE mg/dL
Specific Gravity, Urine: 1.007 (ref 1.005–1.030)
pH: 6 (ref 5.0–8.0)

## 2024-09-07 LAB — BASIC METABOLIC PANEL WITH GFR
Anion gap: 7 (ref 5–15)
BUN: 17 mg/dL (ref 8–23)
CO2: 36 mmol/L — ABNORMAL HIGH (ref 22–32)
Calcium: 9.3 mg/dL (ref 8.9–10.3)
Chloride: 88 mmol/L — ABNORMAL LOW (ref 98–111)
Creatinine, Ser: 0.93 mg/dL (ref 0.44–1.00)
GFR, Estimated: 59 mL/min — ABNORMAL LOW
Glucose, Bld: 90 mg/dL (ref 70–99)
Potassium: 4.4 mmol/L (ref 3.5–5.1)
Sodium: 131 mmol/L — ABNORMAL LOW (ref 135–145)

## 2024-09-07 LAB — PROCALCITONIN: Procalcitonin: 0.16 ng/mL

## 2024-09-07 MED ORDER — LINEZOLID 600 MG PO TABS
600.0000 mg | ORAL_TABLET | Freq: Two times a day (BID) | ORAL | Status: DC
  Administered 2024-09-07 – 2024-09-10 (×7): 600 mg via ORAL
  Filled 2024-09-07 (×9): qty 1

## 2024-09-07 NOTE — Progress Notes (Signed)
 Bladder scan performed after pt voided in BSC. Bladder scan showed a volume of .  Patient placed back in chair, purwick in place due to frequency and incontinence.

## 2024-09-07 NOTE — Progress Notes (Signed)
 " PROGRESS NOTE    KOLBY MYUNG  FMW:[id number] DOB: 1932/09/13 DOA: 09/01/2024 PCP: Arloa [name], MD  Chief Complaint  Patient presents with   Leg Injury    Brief Narrative:   89 yo with hx HFpEF, COPD on 2 L, HTN, dyslipidemia, hx CVA who presented with general malaise and Teon Hudnall fall.  She complained of generalized weakness.  She'd been treated with amoxicillin for cellulitis outpatient.  Diuretics also adjusted for concern for HF.    Assessment & Plan:   Principal Problem:   Acute on chronic diastolic CHF (congestive heart failure) (HCC) Active Problems:   Essential hypertension   COPD (chronic obstructive pulmonary disease) (HCC)   Body mass index (BMI) 37.0-37.9, adult   Chronic respiratory failure with hypoxia and hypercapnia (HCC)   History of stroke   Cellulitis  Volume Overload Acute on chronic diastolic HF  HRmrEF Echo 08/2023 with EF 50-55%, possible apical/apical septal hypokinesis CXR with cardiomegaly, atelectasis L lung base Albumin 3.5, UA without protein Troponin mildly elevated, but flat Pro BNP 3195 (no priors for comparison) + LE edema on exam Repeat echo with mildly decreased function EF 40-45% - grade 1 diastolic dysfunction Will continue lasix  daily (weight 1/25 196.6) Holding SGLT2 inhibitor for now RLE US  negative for DVT Strict I/O, daily weights - at her dry weight on 1/24, weight pending today (she notes her dry weight is close to 198 lbs - this is consistent with he weight from her 05/2024 cardiology visit)   Bilateral LE Cellulitis Suspect she has bilateral venous stasis changes - she notes improvement since admission in LE redness, possible superimposed cellulitis, but no fever, leukocytosis, or TTP on my exam - will try to keep abx course as short as possible.  Nonpurulent.  Plain films only with diffuse edema Legs Poonam Woehrle little more tender today - still erythematous - completed 5 days of strep coverage for nonpurulent cellulitis.  Will expand to  linezolid  and consider additional workup/imaging if needed (exam without crepitus or fluctuance).   Suprapubic Abdominal Pain Follow bladder scan  Follow repeat UA/culture reflex   Asymptomatic Bacteruria 40,000 morganella morganii Seems asymptomatic - UA without LE, nitrite Will defer treatment unless issues (suprapubic abdominal discomfort is new, working up as above)  Chronic Hypoxic Resp Failure COPD on 2 L  Respiratory Acidosis Currently requiring 2 L by [place] continue scheduled duonebs with prn albuterol  - no wheezing today on my exam Compensated respiratory acidosis, she declined bipap  Hypertension Metoprolol   Hx CVA  NSTEMI Aspirin , statin  Prediabetes SGLT2 inhibitor on hold  No one is with her on Saturday.  Sister particularly concerned about Bani Gianfrancesco discharge over the wknd.  I think given her age and support during the week (she has caregiver that comes 4 hrs/day on Weekdays).  Her caregiver has key to house, not available today.  Discussed importance of mobility, OOBTC.    DVT prophylaxis: heparin  Code Status: DNR Family Communication: discussed with sister over phone 1/25 Disposition:   Status is: Observation The patient remains OBS appropriate and will d/c before 2 midnights.   Consultants:  none  Procedures:  none  Antimicrobials:  Anti-infectives (From admission, onward)    Start     Dose/Rate Route Frequency Ordered Stop   09/07/24 1145  linezolid  (ZYVOX ) tablet 600 mg        600 mg Oral Every 12 hours 09/07/24 1058 09/12/24 0959   09/03/24 2200  cefadroxil  (DURICEF) capsule 500 mg  500 mg Oral 2 times daily 09/03/24 1418 09/06/24 2055   09/02/24 0845  ceFAZolin  (ANCEF ) IVPB 2g/100 mL premix  Status:  Discontinued        2 g 200 mL/hr over 30 Minutes Intravenous Every 8 hours 09/02/24 0844 09/03/24 1415   09/01/24 2200  linezolid  (ZYVOX ) IVPB 600 mg  Status:  Discontinued        600 mg 300 mL/hr over 60 Minutes Intravenous Every 12 hours  09/01/24 2134 09/02/24 0843       Subjective:   Notes Camri Molloy little more tenderness  Objective: Vitals:   09/07/24 0449 09/07/24 0753 09/07/24 0856 09/07/24 1345  BP: (!) 119/54  120/63 (!) 128/54  Pulse: 66  81 78  Resp: 20  (!) 22 16  Temp: 98.1 F (36.7 C)  98.4 F (36.9 C) 97.8 F (36.6 C)  TempSrc: Oral  Oral Oral  SpO2: 92% 90% 92% 95%  Weight:      Height:        Intake/Output Summary (Last 24 hours) at 09/07/2024 1649 Last data filed at 09/07/2024 1300 Gross per 24 hour  Intake 720 ml  Output 1000 ml  Net -280 ml   Filed Weights   09/04/24 1500 09/05/24 0500 09/06/24 0712  Weight: 90.8 kg 90.1 kg 89.2 kg    Examination:  General: No acute distress. Cardiovascular: RRR Lungs: unlabored Suprapubic TTP Neurological: Alert and oriented 3. Moves all extremities 4 with equal strength. Cranial nerves II through XII grossly intact. Extremities: bilateral erythema - legs Waylyn Tenbrink little more tender today   Data Reviewed: I have personally reviewed following labs and imaging studies  CBC: Recent Labs  Lab 09/01/24 1701 09/02/24 0542 09/03/24 0525 09/04/24 0512 09/05/24 0456 09/06/24 0528 09/07/24 1054  WBC 8.7   < > 8.4 7.5 7.8 7.8 6.0  NEUTROABS 6.1  --  5.3 4.8 4.8  --  4.1  HGB 14.0   < > 13.4 13.7 13.5 14.1 13.8  HCT 45.8   < > 45.4 46.5* 45.2 45.4 43.5  MCV 97.9   < > 98.9 98.7 98.7 95.0 93.5  PLT 151   < > 142* 145* 136* 130* 119*   < > = values in this interval not displayed.    Basic Metabolic Panel: Recent Labs  Lab 09/03/24 0525 09/04/24 0512 09/05/24 0456 09/06/24 0528 09/07/24 0431  NA 137 137 136 131* 131*  K 3.8 4.3 4.9 4.5 4.4  CL 93* 93* 90* 87* 88*  CO2 40* 39* 43* 40* 36*  GLUCOSE 102* 104* 108* 105* 90  BUN 11 11 10 11 17   CREATININE 0.82 0.81 0.79 0.84 0.93  CALCIUM  9.1 9.3 9.2 9.6 9.3  MG 2.3 2.6* 2.4 2.6* 2.7*  PHOS 3.6 4.2 3.5 4.0 3.5    GFR: Estimated Creatinine Clearance: 39.9 mL/min (by C-G formula based on SCr of  0.93 mg/dL).  Liver Function Tests: Recent Labs  Lab 09/02/24 0542 09/03/24 0525 09/04/24 0512 09/05/24 0456  AST 26  25 24 22 23   ALT 12  12 7  <5 5  ALKPHOS 43  43 45 48 50  BILITOT 0.3  0.4 0.4 0.4 0.4  PROT 6.3*  6.4* 6.6 6.7 6.5  ALBUMIN 3.5  3.6 3.6 3.6 3.6    CBG: No results for input(s): GLUCAP in the last 168 hours.   Recent Results (from the past 240 hours)  Surgical pcr screen     Status: None   Collection Time: 09/01/24 10:08 PM  Result  Value Ref Range Status   MRSA, PCR NEGATIVE NEGATIVE Final   Staphylococcus aureus NEGATIVE NEGATIVE Final    Comment: (NOTE) The Xpert SA Assay (FDA approved for NASAL specimens in patients 1 years of age and older), is one component of Rima Blizzard comprehensive surveillance program. It is not intended to diagnose infection nor to guide or monitor treatment. Performed at [facility], [address], [place], [place] [address]   Urine Culture (for pregnant, neutropenic or urologic patients or patients with an indwelling urinary catheter)     Status: Abnormal   Collection Time: 09/01/24 11:40 PM   Specimen: Urine, Clean Catch  Result Value Ref Range Status   Specimen Description   Final    URINE, CLEAN CATCH Performed at [facility], [address], [place], [place] [address]    Special Requests   Final    NONE Performed at [facility], [address], [place], [place] [address]    Culture 40,000 COLONIES/mL [facility] MORGANII (Antha Niday)  Final   Report Status 09/04/2024 FINAL  Final   Organism ID, Bacteria MORGANELLA MORGANII (Leibish Mcgregor)  Final      Susceptibility   Morganella morganii - MIC*    AMPICILLIN >=32 RESISTANT Resistant     ERTAPENEM <=0.12 SENSITIVE Sensitive     CIPROFLOXACIN <=0.06 SENSITIVE Sensitive     GENTAMICIN <=1 SENSITIVE Sensitive     NITROFURANTOIN 128 RESISTANT Resistant     TRIMETH/SULFA <=20 SENSITIVE Sensitive     AMPICILLIN/SULBACTAM >=32  RESISTANT Resistant     PIP/TAZO Value in next row Sensitive      <=4 SENSITIVEThis is Aeon Koors modified FDA-approved test that has been validated and its performance characteristics determined by the reporting laboratory.  This laboratory is certified under the Clinical Laboratory Improvement Amendments CLIA as qualified to perform high complexity clinical laboratory testing.    MEROPENEM Value in next row Sensitive      <=4 SENSITIVEThis is Aidin Doane modified FDA-approved test that has been validated and its performance characteristics determined by the reporting laboratory.  This laboratory is certified under the Clinical Laboratory Improvement Amendments CLIA as qualified to perform high complexity clinical laboratory testing.    * 40,000 COLONIES/mL [facility] MORGANII         Radiology Studies: No results found.       Scheduled Meds:  aspirin  EC  81 mg Oral Daily   budesonide   0.25 mg Nebulization BID   dapagliflozin  propanediol  10 mg Oral Daily   furosemide   40 mg Oral Daily   guaiFENesin   600 mg Oral BID   heparin   5,000 Units Subcutaneous Q8H   ipratropium-albuterol   3 mL Inhalation TID   linezolid   600 mg Oral Q12H   metoprolol  succinate  50 mg Oral Daily   rosuvastatin   40 mg Oral Daily   sodium chloride  flush  3 mL Intravenous Q12H   Continuous Infusions:     LOS: 5 days    Time spent: over 30 min     Meliton Monte, MD [facility]   To contact the attending provider between 7A-7P or the covering provider during after hours 7P-7A, please log into the web site [email or web address] and access using universal [place] password for that web site. If you do not have the password, please call the hospital operator.  09/07/2024, 4:49 PM    "

## 2024-09-07 NOTE — Progress Notes (Signed)
 Pt requested to be on Bipap. Tolerated 3 hours on Bipap. RT aware.

## 2024-09-07 NOTE — Progress Notes (Signed)
 Occupational Therapy Treatment Patient Details Name: [name] MRN: [id number] DOB: 15-Dec-1932 Today's Date: 09/07/2024   History of present illness Pt is an 89y.o. female admitted on 09/01/24 from home due to a fall, generalized weakness and malaise. PMH: HFpEF, COPD on 2L, HTN, dyslipidemia, and h/o CVA.   OT comments  Pt making gradual progress towards goals. Pt received upright in recliner, requesting to use BSC. CGA with +1 HHA provided for SPT recliner <> BSC, cues for hand placement and technique. Able to perform pericare from lateral leans supervision, MIN A for brief management. Grooming tasks seated in recliner with setup. Pt left with RRT end of session for breathing treatment. Pt will benefit from [facility] OT at hospital discharge, OT to follow acutely.       If plan is discharge home, recommend the following:  A little help with walking and/or transfers;A little help with bathing/dressing/bathroom   Equipment Recommendations  None recommended by OT       Precautions / Restrictions Precautions Precautions: Fall Recall of Precautions/Restrictions: Intact Precaution/Restrictions Comments: monitor O2 Restrictions Weight Bearing Restrictions Per Provider Order: No       Mobility Bed Mobility Overal bed mobility: Needs Assistance             General bed mobility comments: oob in recliner Patient Response: Cooperative  Transfers Overall transfer level: Needs assistance Equipment used: 1 person hand held assist Transfers: Sit to/from Stand Sit to Stand: Contact guard assist     Step pivot transfers: Contact guard assist     General transfer comment: Cues for safe hand placement     Balance Overall balance assessment: Needs assistance Sitting-balance support: Feet supported, No upper extremity supported Sitting balance-Leahy Scale: Good     Standing balance support: Bilateral upper extremity supported, During functional activity, Reliant on assistive device  for balance Standing balance-Leahy Scale: Fair                             ADL either performed or assessed with clinical judgement   ADL Overall ADL's : Needs assistance/impaired     Grooming: Wash/dry hands;Wash/dry face;Sitting;Set up Grooming Details (indicate cue type and reason): seated in recliner after BSC use                 Toilet Transfer: Contact guard assist;Ambulation;BSC/3in1 Statistician Details (indicate cue type and reason): recliner <> BSC with HHA, CGA for safety Toileting- Clothing Manipulation and Hygiene: Supervision/safety;Sitting/lateral lean;Minimal assistance Toileting - Clothing Manipulation Details (indicate cue type and reason): supervision for pericare from lateral leans, minA for brief mgmt     Functional mobility during ADLs: Contact guard assist      Extremity/Trunk Assessment Upper Extremity Assessment Upper Extremity Assessment: Overall WFL for tasks assessed       Cervical / Trunk Assessment Cervical / Trunk Assessment: Other exceptions Cervical / Trunk Exceptions: body habitus    Vision Baseline Vision/History: 1 Wears glasses Ability to See in Adequate Light: 0 Adequate Patient Visual Report: No change from baseline           Communication Communication Communication: No apparent difficulties   Cognition Arousal: Alert Behavior During Therapy: WFL for tasks assessed/performed Cognition: No apparent impairments                               Following commands: Intact  General Comments Left in recliner with RT present for breathing tx    Pertinent Vitals/ Pain       Pain Assessment Pain Assessment: No/denies pain   Frequency  Min 2X/week        Progress Toward Goals  OT Goals(current goals can now be found in the care plan section)  Progress towards OT goals: Progressing toward goals  Acute Rehab OT Goals Time For Goal Achievement: 09/16/24 Potential to  Achieve Goals: Good  Plan         AM-PAC OT 6 Clicks Daily Activity     Outcome Measure   Help from another person eating meals?: None Help from another person taking care of personal grooming?: None Help from another person toileting, which includes using toliet, bedpan, or urinal?: A Little Help from another person bathing (including washing, rinsing, drying)?: A Little Help from another person to put on and taking off regular upper body clothing?: A Little Help from another person to put on and taking off regular lower body clothing?: A Little 6 Click Score: 20    End of Session Equipment Utilized During Treatment: Oxygen  OT Visit Diagnosis: Unsteadiness on feet (R26.81);History of falling (Z91.81);Muscle weakness (generalized) (M62.81)   Activity Tolerance Patient tolerated treatment well   Patient Left in chair;with chair alarm set;Other (comment);with call bell/phone within reach (RRT in room)   Nurse Communication Mobility status        Time: 1421-1435 OT Time Calculation (min): 14 min  Charges: OT General Charges $OT Visit: 1 Visit OT Treatments $Self Care/Home Management : 8-22 mins  Yeimi Debnam L. Raveena Hebdon, OTR/L  09/07/24, 3:04 PM

## 2024-09-07 NOTE — Progress Notes (Signed)
 Physical Therapy Treatment Patient Details Name: [name] MRN: [id number] DOB: Jan 18, 1933 Today's Date: 09/07/2024   History of Present Illness Pt is an 89y.o. female admitted on 09/01/24 from home due to a fall, generalized weakness and malaise. PMH: HFpEF, COPD on 2L, HTN, dyslipidemia, and h/o CVA.    PT Comments  At start of session pt was seated in recliner and was saturated in urine. Assisted pt to bedside commode and with pericare.  Pt ambulated 60' with RW with flexed posture, SpO2 93% on 2L O2 with walking, distance limited by fatigue.     If plan is discharge home, recommend the following: A little help with walking and/or transfers;A little help with bathing/dressing/bathroom;Assistance with cooking/housework;Assist for transportation;Help with stairs or ramp for entrance   Can travel by private vehicle        Equipment Recommendations  None recommended by PT    Recommendations for Other Services       Precautions / Restrictions Precautions Precautions: Fall Recall of Precautions/Restrictions: Intact Precaution/Restrictions Comments: monitor O2 Restrictions Weight Bearing Restrictions Per Provider Order: No     Mobility  Bed Mobility               General bed mobility comments: oob in recliner    Transfers Overall transfer level: Needs assistance Equipment used: Rolling walker (2 wheels) Transfers: Sit to/from Stand Sit to Stand: Contact guard assist   Step pivot transfers: Contact guard assist       General transfer comment: Cues for safe hand placement    Ambulation/Gait Ambulation/Gait assistance: Contact guard assist Gait Distance (Feet): 44 Feet Assistive device: Rolling walker (2 wheels) Gait Pattern/deviations: Trunk flexed, Step-through pattern, Decreased stride length Gait velocity: decreased     General Gait Details: pt leaned forearms on RW, VCs for flexed posture (pt reports flexed trunk is her baseline); SpO2 93% on 2L O2  walking, distance limited by fatigue   Stairs             Wheelchair Mobility     Tilt Bed    Modified Rankin (Stroke Patients Only)       Balance Overall balance assessment: Needs assistance Sitting-balance support: Feet supported, No upper extremity supported Sitting balance-Leahy Scale: Fair     Standing balance support: Bilateral upper extremity supported, During functional activity, Reliant on assistive device for balance Standing balance-Leahy Scale: Fair                              Hotel Manager: No apparent difficulties  Cognition Arousal: Alert Behavior During Therapy: WFL for tasks assessed/performed   PT - Cognitive impairments: No apparent impairments                         Following commands: Intact      Cueing Cueing Techniques: Verbal cues  Exercises      General Comments        Pertinent Vitals/Pain Pain Assessment Pain Assessment: No/denies pain    Home Living                          Prior Function            PT Goals (current goals can now be found in the care plan section) Acute Rehab PT Goals Patient Stated Goal: get moving again PT Goal Formulation: With patient Time For Goal Achievement: 09/16/24  Potential to Achieve Goals: Good Progress towards PT goals: Progressing toward goals    Frequency    Min 3X/week      PT Plan      Co-evaluation              AM-PAC PT 6 Clicks Mobility   Outcome Measure  Help needed turning from your back to your side while in a flat bed without using bedrails?: A Little Help needed moving from lying on your back to sitting on the side of a flat bed without using bedrails?: A Little Help needed moving to and from a bed to a chair (including a wheelchair)?: A Little Help needed standing up from a chair using your arms (e.g., wheelchair or bedside chair)?: A Little Help needed to walk in hospital room?: A  Little Help needed climbing 3-5 steps with a railing? : A Little 6 Click Score: 18    End of Session Equipment Utilized During Treatment: Gait belt;Oxygen Activity Tolerance: Patient tolerated treatment well;Patient limited by fatigue Patient left: in chair;with call bell/phone within reach;with chair alarm set Nurse Communication: Mobility status PT Visit Diagnosis: Unsteadiness on feet (R26.81);Muscle weakness (generalized) (M62.81);History of falling (Z91.81)     Time: 8854-8780 PT Time Calculation (min) (ACUTE ONLY): 34 min  Charges:    $Gait Training: 8-22 mins $Therapeutic Activity: 8-22 mins PT General Charges $$ ACUTE PT VISIT: 1 Visit                     Sylvan Delon Copp PT 09/07/2024  Acute Rehabilitation Services  Office [phone]

## 2024-09-08 ENCOUNTER — Inpatient Hospital Stay (HOSPITAL_COMMUNITY)

## 2024-09-08 DIAGNOSIS — I5033 Acute on chronic diastolic (congestive) heart failure: Secondary | ICD-10-CM | POA: Diagnosis not present

## 2024-09-08 LAB — COMPREHENSIVE METABOLIC PANEL WITH GFR
ALT: 15 U/L (ref 0–44)
AST: 32 U/L (ref 15–41)
Albumin: 3.8 g/dL (ref 3.5–5.0)
Alkaline Phosphatase: 50 U/L (ref 38–126)
Anion gap: 5 (ref 5–15)
BUN: 18 mg/dL (ref 8–23)
CO2: 38 mmol/L — ABNORMAL HIGH (ref 22–32)
Calcium: 9.3 mg/dL (ref 8.9–10.3)
Chloride: 86 mmol/L — ABNORMAL LOW (ref 98–111)
Creatinine, Ser: 0.95 mg/dL (ref 0.44–1.00)
GFR, Estimated: 57 mL/min — ABNORMAL LOW
Glucose, Bld: 103 mg/dL — ABNORMAL HIGH (ref 70–99)
Potassium: 4.1 mmol/L (ref 3.5–5.1)
Sodium: 129 mmol/L — ABNORMAL LOW (ref 135–145)
Total Bilirubin: 0.4 mg/dL (ref 0.0–1.2)
Total Protein: 6.6 g/dL (ref 6.5–8.1)

## 2024-09-08 LAB — CBC WITH DIFFERENTIAL/PLATELET
Abs Immature Granulocytes: 0.02 10*3/uL (ref 0.00–0.07)
Basophils Absolute: 0 10*3/uL (ref 0.0–0.1)
Basophils Relative: 0 %
Eosinophils Absolute: 0.3 10*3/uL (ref 0.0–0.5)
Eosinophils Relative: 4 %
HCT: 42.6 % (ref 36.0–46.0)
Hemoglobin: 13.3 g/dL (ref 12.0–15.0)
Immature Granulocytes: 0 %
Lymphocytes Relative: 30 %
Lymphs Abs: 2.2 10*3/uL (ref 0.7–4.0)
MCH: 29.8 pg (ref 26.0–34.0)
MCHC: 31.2 g/dL (ref 30.0–36.0)
MCV: 95.5 fL (ref 80.0–100.0)
Monocytes Absolute: 0.7 10*3/uL (ref 0.1–1.0)
Monocytes Relative: 10 %
Neutro Abs: 4.2 10*3/uL (ref 1.7–7.7)
Neutrophils Relative %: 56 %
Platelets: 131 10*3/uL — ABNORMAL LOW (ref 150–400)
RBC: 4.46 MIL/uL (ref 3.87–5.11)
RDW: 13.4 % (ref 11.5–15.5)
WBC: 7.4 10*3/uL (ref 4.0–10.5)
nRBC: 0 % (ref 0.0–0.2)

## 2024-09-08 LAB — PHOSPHORUS: Phosphorus: 4 mg/dL (ref 2.5–4.6)

## 2024-09-08 LAB — MAGNESIUM: Magnesium: 2.6 mg/dL — ABNORMAL HIGH (ref 1.7–2.4)

## 2024-09-08 MED ORDER — POLYETHYLENE GLYCOL 3350 17 G PO PACK
17.0000 g | PACK | Freq: Every day | ORAL | Status: DC
  Administered 2024-09-08 – 2024-09-10 (×3): 17 g via ORAL
  Filled 2024-09-08 (×3): qty 1

## 2024-09-08 MED ORDER — ORAL CARE MOUTH RINSE: 15.0000 mL | OROMUCOSAL | Status: DC | PRN

## 2024-09-08 MED ORDER — FUROSEMIDE 40 MG PO TABS
40.0000 mg | ORAL_TABLET | Freq: Two times a day (BID) | ORAL | Status: DC
  Administered 2024-09-08 – 2024-09-09 (×2): 40 mg via ORAL
  Filled 2024-09-08 (×2): qty 1

## 2024-09-08 MED ORDER — DOCUSATE SODIUM 100 MG PO CAPS
100.0000 mg | ORAL_CAPSULE | Freq: Every day | ORAL | Status: DC
  Administered 2024-09-08 – 2024-09-09 (×2): 100 mg via ORAL
  Filled 2024-09-08 (×2): qty 1

## 2024-09-08 NOTE — Progress Notes (Signed)
 " PROGRESS NOTE    [name]  FMW:[id number] DOB: October 02, 1932 DOA: 09/01/2024 PCP: Arloa [name], MD  Chief Complaint  Patient presents with   Leg Injury    Brief Narrative:   89 yo with hx HFpEF, COPD on 2 L, HTN, dyslipidemia, hx CVA who presented with general malaise and [name] fall.  She complained of generalized weakness.  She'd been treated with amoxicillin for cellulitis outpatient.  Diuretics also adjusted for concern for HF.   She's improved from volume standpoint and is now back to her dry weight.  We'd treated with duricef for nonpurulent cellulitis, but on 1/26 had worsening TTP.  No crepitus or fluctuance, no fever or leukocytosis.  Abx broadened to linezolid .   CT RLE ordered and is pending on 1/27.  She has caregivers at home during the week, 4 hrs Hodaya Curto day.  One currently has the key to her house.  Discharge pending improvement in LE TTP.    Assessment & Plan:   Principal Problem:   Acute on chronic diastolic CHF (congestive heart failure) (HCC) Active Problems:   Essential hypertension   COPD (chronic obstructive pulmonary disease) (HCC)   Body mass index (BMI) 37.0-37.9, adult   Chronic respiratory failure with hypoxia and hypercapnia (HCC)   History of stroke   Cellulitis  Volume Overload Acute on chronic diastolic HF  HRmrEF Echo 08/2023 with EF 50-55%, possible apical/apical septal hypokinesis CXR with cardiomegaly, atelectasis L lung base Albumin 3.5, UA without protein Troponin mildly elevated, but flat Pro BNP 3195 (no priors for comparison) + LE edema on exam Repeat echo with mildly decreased function EF 40-45% - grade 1 diastolic dysfunction Will continue lasix  daily (weight 1/25 196.6) Holding SGLT2 inhibitor for now RLE US  negative for DVT Strict I/O, daily weights - at her dry weight on 1/24, weight pending today (she notes her dry weight is close to 198 lbs - this is consistent with he weight from her 05/2024 cardiology visit)  Needs outpatient  cards follow up with her newly reduced EF.  Bilateral LE Cellulitis Suspected bilateral venous stasis changes For that reason, treated for nonpurulent cellulitis with duricef on presentation On 1/26 more TTP, broadened to linezolid  - still no leukocytosis or fever.  Procalcitonin 0.16 on 1/26.   Exam without fluctuance or crepitus.   CT R leg given R>L TTP  Hyponatremia Monitor for now, she's on home oral lasix  dose  Suprapubic Abdominal Pain Follow bladder scan - without retention Repeat UA without nitrite/LE Abdominal pain resolved today  Asymptomatic Bacteruria 40,000 morganella morganii Seems asymptomatic - UA without LE, nitrite Will defer treatment unless issues  Chronic Hypoxic Resp Failure COPD on 2 L  Respiratory Acidosis Currently requiring 2 L by [place] continue scheduled duonebs with prn albuterol  - no wheezing today on my exam Compensated respiratory acidosis, she declined bipap  Hypertension Metoprolol   Hx CVA  NSTEMI Aspirin , statin  Prediabetes SGLT2 inhibitor on hold  She has caregiver that comes 4 hrs/day on Weekdays.  Her caregiver has key to house notably, needs to be clarified prior to discharge whether she'll be able to get into her house.  Discussed importance of mobility, OOBTC.    DVT prophylaxis: heparin  Code Status: DNR Family Communication: discussed with sister over phone 1/25 Disposition:   Status is: Observation The patient remains OBS appropriate and will d/c before 2 midnights.   Consultants:  none  Procedures:  none  Antimicrobials:  Anti-infectives (From admission, onward)    Start  Dose/Rate Route Frequency Ordered Stop   09/07/24 1145  linezolid  (ZYVOX ) tablet 600 mg        600 mg Oral Every 12 hours 09/07/24 1058 09/12/24 0959   09/03/24 2200  cefadroxil  (DURICEF) capsule 500 mg        500 mg Oral 2 times daily 09/03/24 1418 09/06/24 2055   09/02/24 0845  ceFAZolin  (ANCEF ) IVPB 2g/100 mL premix  Status:  Discontinued         2 g 200 mL/hr over 30 Minutes Intravenous Every 8 hours 09/02/24 0844 09/03/24 1415   09/01/24 2200  linezolid  (ZYVOX ) IVPB 600 mg  Status:  Discontinued        600 mg 300 mL/hr over 60 Minutes Intravenous Every 12 hours 09/01/24 2134 09/02/24 0843       Subjective:   More tender in her legs today Worsened over past few days  Objective: Vitals:   09/08/24 0749 09/08/24 0750 09/08/24 1306 09/08/24 1349  BP:   (!) 117/50   Pulse:   77   Resp:   19   Temp:   98.1 F (36.7 C)   TempSrc:   Oral   SpO2: 96% 96% 93% 94%  Weight:      Height:        Intake/Output Summary (Last 24 hours) at 09/08/2024 1433 Last data filed at 09/08/2024 1320 Gross per 24 hour  Intake 840 ml  Output 650 ml  Net 190 ml   Filed Weights   09/05/24 0500 09/06/24 0712 09/08/24 0500  Weight: 90.1 kg 89.2 kg 89.3 kg    Examination:  General: No acute distress. Cardiovascular: RRR Lungs: Clear to auscultation bilaterally Abdomen: suprapubic TTP resolved Neurological: Alert and oriented 3. Moves all extremities 4 with equal strength. Cranial nerves II through XII grossly intact. Skin: Warm and dry. No rashes or lesions. Extremities: continued bilateral LE edema and erythema - legs seem more tender past 2 days, RLE more tender to palpation than L.  No crepitus or appreciated fluctuance.    Data Reviewed: I have personally reviewed following labs and imaging studies  CBC: Recent Labs  Lab 09/03/24 0525 09/04/24 0512 09/05/24 0456 09/06/24 0528 09/07/24 1054 09/08/24 0525  WBC 8.4 7.5 7.8 7.8 6.0 7.4  NEUTROABS 5.3 4.8 4.8  --  4.1 4.2  HGB 13.4 13.7 13.5 14.1 13.8 13.3  HCT 45.4 46.5* 45.2 45.4 43.5 42.6  MCV 98.9 98.7 98.7 95.0 93.5 95.5  PLT 142* 145* 136* 130* 119* 131*    Basic Metabolic Panel: Recent Labs  Lab 09/04/24 0512 09/05/24 0456 09/06/24 0528 09/07/24 0431 09/08/24 0525  NA 137 136 131* 131* 129*  K 4.3 4.9 4.5 4.4 4.1  CL 93* 90* 87* 88* 86*  CO2 39*  43* 40* 36* 38*  GLUCOSE 104* 108* 105* 90 103*  BUN 11 10 11 17 18   CREATININE 0.81 0.79 0.84 0.93 0.95  CALCIUM  9.3 9.2 9.6 9.3 9.3  MG 2.6* 2.4 2.6* 2.7* 2.6*  PHOS 4.2 3.5 4.0 3.5 4.0    GFR: Estimated Creatinine Clearance: 39 mL/min (by C-G formula based on SCr of 0.95 mg/dL).  Liver Function Tests: Recent Labs  Lab 09/02/24 0542 09/03/24 0525 09/04/24 0512 09/05/24 0456 09/08/24 0525  AST 26  25 24 22 23  32  ALT 12  12 7  5 5 15   ALKPHOS 43  43 45 48 50 50  BILITOT 0.3  0.4 0.4 0.4 0.4 0.4  PROT 6.3*  6.4* 6.6 6.7 6.5 6.6  ALBUMIN 3.5  3.6 3.6 3.6 3.6 3.8    CBG: No results for input(s): GLUCAP in the last 168 hours.   Recent Results (from the past 240 hours)  Surgical pcr screen     Status: None   Collection Time: 09/01/24 10:08 PM  Result Value Ref Range Status   MRSA, PCR NEGATIVE NEGATIVE Final   Staphylococcus aureus NEGATIVE NEGATIVE Final    Comment: (NOTE) The Xpert SA Assay (FDA approved for NASAL specimens in patients 78 years of age and older), is one component of Laparis Durrett comprehensive surveillance program. It is not intended to diagnose infection nor to guide or monitor treatment. Performed at [facility], [address] [place], [place] [address]   Urine Culture (for pregnant, neutropenic or urologic patients or patients with an indwelling urinary catheter)     Status: Abnormal   Collection Time: 09/01/24 11:40 PM   Specimen: Urine, Clean Catch  Result Value Ref Range Status   Specimen Description   Final    URINE, CLEAN CATCH Performed at [facility], [address], [place], [place] [address]    Special Requests   Final    NONE Performed at [facility], [address], [place], [place] [address]    Culture 40,000 COLONIES/mL [facility] MORGANII (Monta Maiorana)  Final   Report Status 09/04/2024 FINAL  Final   Organism ID, Bacteria MORGANELLA MORGANII (Rolf Fells)  Final      Susceptibility    Morganella morganii - MIC*    AMPICILLIN >=32 RESISTANT Resistant     ERTAPENEM <=0.12 SENSITIVE Sensitive     CIPROFLOXACIN <=0.06 SENSITIVE Sensitive     GENTAMICIN <=1 SENSITIVE Sensitive     NITROFURANTOIN 128 RESISTANT Resistant     TRIMETH/SULFA <=20 SENSITIVE Sensitive     AMPICILLIN/SULBACTAM >=32 RESISTANT Resistant     PIP/TAZO Value in next row Sensitive      <=4 SENSITIVEThis is Lakela Kuba modified FDA-approved test that has been validated and its performance characteristics determined by the reporting laboratory.  This laboratory is certified under the Clinical Laboratory Improvement Amendments CLIA as qualified to perform high complexity clinical laboratory testing.    MEROPENEM Value in next row Sensitive      <=4 SENSITIVEThis is Alize Acy modified FDA-approved test that has been validated and its performance characteristics determined by the reporting laboratory.  This laboratory is certified under the Clinical Laboratory Improvement Amendments CLIA as qualified to perform high complexity clinical laboratory testing.    * 40,000 COLONIES/mL [facility] MORGANII         Radiology Studies: No results found.       Scheduled Meds:  aspirin  EC  81 mg Oral Daily   budesonide   0.25 mg Nebulization BID   dapagliflozin  propanediol  10 mg Oral Daily   docusate sodium   100 mg Oral Daily   furosemide   40 mg Oral Daily   guaiFENesin   600 mg Oral BID   heparin   5,000 Units Subcutaneous Q8H   ipratropium-albuterol   3 mL Inhalation TID   linezolid   600 mg Oral Q12H   metoprolol  succinate  50 mg Oral Daily   rosuvastatin   40 mg Oral Daily   sodium chloride  flush  3 mL Intravenous Q12H   Continuous Infusions:     LOS: 6 days    Time spent: over 30 min     Meliton Monte, MD [facility]   To contact the attending provider between 7A-7P or the covering provider during after hours 7P-7A,  please log into the web site [email or web address] and access using universal [place]  password for that web site. If you do not have the password, please call the hospital operator.  09/08/2024, 2:33 PM    "

## 2024-09-08 NOTE — Progress Notes (Signed)
 OT Cancellation Note  Patient Details Name: [name] MRN: [id number] DOB: 02-05-1933   Cancelled Treatment:    Reason Eval/Treat Not Completed: Medical issues which prohibited therapy. Pt pending CT of RLE, OT will follow acutely and see as appropriate upon completion of scan results.   Wylder Macomber L. Ivey Nembhard, OTR/L  09/08/24, 3:55 PM

## 2024-09-09 LAB — COMPREHENSIVE METABOLIC PANEL WITH GFR
ALT: 20 U/L (ref 0–44)
AST: 38 U/L (ref 15–41)
Albumin: 4 g/dL (ref 3.5–5.0)
Alkaline Phosphatase: 52 U/L (ref 38–126)
Anion gap: 7 (ref 5–15)
BUN: 18 mg/dL (ref 8–23)
CO2: 36 mmol/L — ABNORMAL HIGH (ref 22–32)
Calcium: 9.3 mg/dL (ref 8.9–10.3)
Chloride: 91 mmol/L — ABNORMAL LOW (ref 98–111)
Creatinine, Ser: 1.13 mg/dL — ABNORMAL HIGH (ref 0.44–1.00)
GFR, Estimated: 46 mL/min — ABNORMAL LOW
Glucose, Bld: 101 mg/dL — ABNORMAL HIGH (ref 70–99)
Potassium: 4.6 mmol/L (ref 3.5–5.1)
Sodium: 135 mmol/L (ref 135–145)
Total Bilirubin: 0.4 mg/dL (ref 0.0–1.2)
Total Protein: 7.1 g/dL (ref 6.5–8.1)

## 2024-09-09 LAB — CBC WITH DIFFERENTIAL/PLATELET
Abs Immature Granulocytes: 0.02 10*3/uL (ref 0.00–0.07)
Basophils Absolute: 0 10*3/uL (ref 0.0–0.1)
Basophils Relative: 1 %
Eosinophils Absolute: 0.3 10*3/uL (ref 0.0–0.5)
Eosinophils Relative: 4 %
HCT: 48.1 % — ABNORMAL HIGH (ref 36.0–46.0)
Hemoglobin: 14.8 g/dL (ref 12.0–15.0)
Immature Granulocytes: 0 %
Lymphocytes Relative: 30 %
Lymphs Abs: 1.9 10*3/uL (ref 0.7–4.0)
MCH: 29.5 pg (ref 26.0–34.0)
MCHC: 30.8 g/dL (ref 30.0–36.0)
MCV: 95.8 fL (ref 80.0–100.0)
Monocytes Absolute: 0.7 10*3/uL (ref 0.1–1.0)
Monocytes Relative: 11 %
Neutro Abs: 3.5 10*3/uL (ref 1.7–7.7)
Neutrophils Relative %: 54 %
Platelets: 122 10*3/uL — ABNORMAL LOW (ref 150–400)
RBC: 5.02 MIL/uL (ref 3.87–5.11)
RDW: 13.6 % (ref 11.5–15.5)
WBC: 6.3 10*3/uL (ref 4.0–10.5)
nRBC: 0 % (ref 0.0–0.2)

## 2024-09-09 LAB — PHOSPHORUS: Phosphorus: 4.5 mg/dL (ref 2.5–4.6)

## 2024-09-09 LAB — MAGNESIUM: Magnesium: 2.7 mg/dL — ABNORMAL HIGH (ref 1.7–2.4)

## 2024-09-09 MED ORDER — FUROSEMIDE 40 MG PO TABS
40.0000 mg | ORAL_TABLET | Freq: Every day | ORAL | Status: DC
  Administered 2024-09-10: 40 mg via ORAL
  Filled 2024-09-09: qty 1

## 2024-09-09 NOTE — Progress Notes (Signed)
 Mobility Specialist - Progress Note:  [place] 2 L During Mobility: 94% SPO2  09/09/24 1621  Mobility  Activity  (Chair Exercises)  Level of Assistance Independent  Range of Motion/Exercises Active  Activity Response Tolerated well  Mobility Referral Yes  Mobility visit 1 Mobility  Mobility Specialist Start Time (ACUTE ONLY) 1526  Mobility Specialist Stop Time (ACUTE ONLY) 1536  Mobility Specialist Time Calculation (min) (ACUTE ONLY) 10 min   Pt was received in recliner and agreed to mobility exercises: Seated BLE Exercises: 10 reps each  1) Ankle Pumps  2) Knee Extension  3) Marching    4) Hip Adduction (pillow squeezes)   Pt had no complaints/issues during session. Returned to recliner with all needs met.  [employer] - Mobility Specialist - Acute Rehabilitation Can be reached via [employer]

## 2024-09-09 NOTE — TOC Progression Note (Signed)
 Transition of Care [facility]) - Progression Note    Patient Details  Name: [name] MRN: [id number] Date of Birth: 05-Mar-1933  [facility] CM/SW Contact  Dajon Rowe, Nathanel, RN Phone Number: 09/09/2024, 11:34 AM  Clinical Narrative:  [facility] accepted for HHPT/OT. May need PTAR. Has home 02-travel tank if able to provide own transport home.      Expected Discharge Plan: Home w Home Health Services Barriers to Discharge: Continued Medical Work up               Expected Discharge Plan and Services   Discharge Planning Services: CM Consult Post Acute Care Choice: Home Health Living arrangements for the past 2 months: Single Family Home                           HH Arranged: PT, OT HH Agency: [facility] Date HH Agency Contacted: 09/04/24 Time HH Agency Contacted: 1149 Representative spoke with at [facility] Agency: [facility]   Social Drivers of Health (SDOH) Interventions SDOH Screenings   Food Insecurity: No Food Insecurity (09/02/2024)  Housing: Low Risk (09/02/2024)  Transportation Needs: No Transportation Needs (09/02/2024)  Utilities: Not At Risk (09/02/2024)  Social Connections: Moderately Isolated (09/02/2024)  Tobacco Use: Medium Risk (09/03/2024)    Readmission Risk Interventions     No data to display

## 2024-09-09 NOTE — Progress Notes (Signed)
 " PROGRESS NOTE    [name]  FMW:[id number] DOB: [birth year] DOA: 09/01/2024 PCP: Arloa [name], MD    Brief Narrative:   [name] is a 89 y.o. female with past medical history significant for chronic diastolic congestive heart failure, HTN, HLD, COPD on 2 L nasal cannula at baseline, history of CVA who presented to [facility] ED from home via EMS on 09/01/2024 with shortness of breath, right lower extremity edema/redness.  Recently started on amoxicillin by PCP for cellulitis.  In the ED, temperature 98.5 F, HR 77, RR 20, BP 173/81, SpO2 99% on room air.  WBC 8.7, hemoglobin 14.0, platelet count 151.  Sodium 142, potassium 4.1, chloride 95, CO2 39, glucose 105, BUN 17, creatinine 0.82.  BNP 3195.0.  High-sensitivity troponin 20 followed by 21.  Chest x-ray with borderline cardiomegaly, probable atelectasis left lung base.  Vascular duplex ultrasound right lower extremity negative for DVT.  Patient was given DuoNeb, furosemide  40 mg IV x 1.  TRH consulted for admission for further evaluation management of CHF exacerbation, right lower extremity cellulitis.  Assessment & Plan:   Acute on chronic diastolic congestive heart failure HFmrEF HTN Patient presenting with mild shortness of breath, lower extreme edema.  Dry weight reported around 298 pounds.  Chest x-ray with cardiomegaly, atelectasis left lung base.  proBNP elevated 3195.  TTE with LVEF 40 to 45% (50-55% 08/2023), grade 1 diastolic dysfunction.  Was started on IV diuresis with improvement of her symptoms and weight. -- Wt 93.7>>88.6kg (195 lbs) -- net negative 5.8L since admission -- Lasix  40 mg PO daily -- Metoprolol  succinate 50 g p.o. daily -- Farxiga  10 mg p.o. daily -- Strict I's and O's and daily weights -- Outpatient follow-up with cardiology given newly reduced LVEF  Right lower extremity cellulitis Patient initially started by PCP on amoxicillin.  Initially treated with Duricef with worsening symptoms.   CT right lower extremity without contrast with mild circumferential subcutaneous edema distal calf consistent with cellulitis. -- Linezolid  600 mg PO q12h, plan 10 day course  COPD Chronic respiratory failure on 2 L oxygen at baseline -- Pulmicort  neb twice daily -- DuoNeb 3 times daily -- Continue supplemental oxygen, maintain SpO2 greater than 88%  Hx CVA -- Aspirin  81 mg p.o. daily  Weakness/debility/deconditioning: PT and OT recommend home health. -- TOC following for home health  DVT prophylaxis: heparin  injection 5,000 Units Start: 09/01/24 2200    Code Status: Limited: Do not attempt resuscitation (DNR) -DNR-LIMITED -Do Not Intubate/DNI  Family Communication: No family present at bedside this morning  Disposition Plan:  Level of care: Med-Surg Status is: Inpatient Remains inpatient appropriate because: Anticipate discharge home tomorrow    Consultants:  None  Procedures:  Vascular duplex ultrasound right lower extremity  Antimicrobials:  Linezolid  1/20 - 1/20, 1/26>> Cefazolin  1/21 - 1/21 Cefadroxil  1/22 - 1/25   Subjective: Patient seen examined bedside, sitting in bedside chair just finished breakfast.  No specific complaints other than concerned about returning home and obtaining her key from her caregiver.  Updated patient regarding CT results, consistent with cellulitis.  Remains on linezolid .  Discussed anticipate discharge home tomorrow, agreeable.  No other questions or concerns at this time.  Denies headache, no dizziness, no chest pain, no palpitations, no shortness of breath more than her typical baseline, no fever/chills/night sweats, no nausea/vomiting/diarrhea, no focal weakness, no fatigue, no paresthesias.  No acute events overnight per nursing staff.  Objective: Vitals:   09/09/24 9095 09/09/24 9057  09/09/24 0945 09/09/24 0954  BP:    (!) 119/48  Pulse:    69  Resp:      Temp:      TempSrc:      SpO2:  98% 98%   Weight: 88.6 kg     Height:         Intake/Output Summary (Last 24 hours) at 09/09/2024 1221 Last data filed at 09/09/2024 0900 Gross per 24 hour  Intake 720 ml  Output 1150 ml  Net -430 ml   Filed Weights   09/06/24 0712 09/08/24 0500 09/09/24 0904  Weight: 89.2 kg 89.3 kg 88.6 kg    Examination:  Physical Exam: GEN: NAD, alert and oriented x 3, elderly in appearance, obese HEENT: NCAT, PERRL, EOMI, sclera clear, MMM PULM: CTAB w/o wheezes/crackles, normal respiratory effort, on 2 L nasal cannula with SpO2 97% at rest CV: RRR w/o M/G/R GI: abd soft, NTND, NABS, no R/G/M MSK: no peripheral edema, moves all extremity dependently with preserved muscle strength, noted chronic venous changes bilateral lower extremities, right lateral leg wound as depicted below NEURO: CN II-XII intact, no focal deficits, sensation to light touch intact PSYCH: normal mood/affect Integumentary: Chronic venous changes bilateral lower extremities, wound right lateral leg as depicted below without surrounding fluctuance, pain or worsening erythema; otherwise no other concerning rashes/lesions/wounds noted on exposed skin surfaces       Data Reviewed: I have personally reviewed following labs and imaging studies  CBC: Recent Labs  Lab 09/04/24 0512 09/05/24 0456 09/06/24 0528 09/07/24 1054 09/08/24 0525 09/09/24 0527  WBC 7.5 7.8 7.8 6.0 7.4 6.3  NEUTROABS 4.8 4.8  --  4.1 4.2 3.5  HGB 13.7 13.5 14.1 13.8 13.3 14.8  HCT 46.5* 45.2 45.4 43.5 42.6 48.1*  MCV 98.7 98.7 95.0 93.5 95.5 95.8  PLT 145* 136* 130* 119* 131* 122*   Basic Metabolic Panel: Recent Labs  Lab 09/05/24 0456 09/06/24 0528 09/07/24 0431 09/08/24 0525 09/09/24 0527  NA 136 131* 131* 129* 135  K 4.9 4.5 4.4 4.1 4.6  CL 90* 87* 88* 86* 91*  CO2 43* 40* 36* 38* 36*  GLUCOSE 108* 105* 90 103* 101*  BUN 10 11 17 18 18   CREATININE 0.79 0.84 0.93 0.95 1.13*  CALCIUM  9.2 9.6 9.3 9.3 9.3  MG 2.4 2.6* 2.7* 2.6* 2.7*  PHOS 3.5 4.0 3.5 4.0 4.5    GFR: Estimated Creatinine Clearance: 32.7 mL/min (A) (by C-G formula based on SCr of 1.13 mg/dL (H)). Liver Function Tests: Recent Labs  Lab 09/03/24 0525 09/04/24 0512 09/05/24 0456 09/08/24 0525 09/09/24 0527  AST 24 22 23  32 38  ALT 7 5 5 15 20   ALKPHOS 45 48 50 50 52  BILITOT 0.4 0.4 0.4 0.4 0.4  PROT 6.6 6.7 6.5 6.6 7.1  ALBUMIN 3.6 3.6 3.6 3.8 4.0   No results for input(s): LIPASE, AMYLASE in the last 168 hours. No results for input(s): AMMONIA in the last 168 hours. Coagulation Profile: No results for input(s): INR, PROTIME in the last 168 hours. Cardiac Enzymes: No results for input(s): CKTOTAL, CKMB, CKMBINDEX, TROPONINI in the last 168 hours. BNP (last 3 results) Recent Labs    09/01/24 1702  PROBNP 3,195.0*   HbA1C: No results for input(s): HGBA1C in the last 72 hours. CBG: No results for input(s): GLUCAP in the last 168 hours. Lipid Profile: No results for input(s): CHOL, HDL, LDLCALC, TRIG, CHOLHDL, LDLDIRECT in the last 72 hours. Thyroid  Function Tests: No results for input(s): TSH,  T4TOTAL, FREET4, T3FREE, THYROIDAB in the last 72 hours. Anemia Panel: No results for input(s): VITAMINB12, FOLATE, FERRITIN, TIBC, IRON, RETICCTPCT in the last 72 hours. Sepsis Labs: Recent Labs  Lab 09/07/24 1054  PROCALCITON 0.16    Recent Results (from the past 240 hours)  Surgical pcr screen     Status: None   Collection Time: 09/01/24 10:08 PM  Result Value Ref Range Status   MRSA, PCR NEGATIVE NEGATIVE Final   Staphylococcus aureus NEGATIVE NEGATIVE Final    Comment: (NOTE) The Xpert SA Assay (FDA approved for NASAL specimens in patients 56 years of age and older), is one component of a comprehensive surveillance program. It is not intended to diagnose infection nor to guide or monitor treatment. Performed at [facility], [address] [place], [place] [address]   Urine  Culture (for pregnant, neutropenic or urologic patients or patients with an indwelling urinary catheter)     Status: Abnormal   Collection Time: 09/01/24 11:40 PM   Specimen: Urine, Clean Catch  Result Value Ref Range Status   Specimen Description   Final    URINE, CLEAN CATCH Performed at [facility], [address] [place], [place] [address]    Special Requests   Final    NONE Performed at [facility], [address] [place], [place] [address]    Culture 40,000 COLONIES/mL [facility] MORGANII (A)  Final   Report Status 09/04/2024 FINAL  Final   Organism ID, Bacteria MORGANELLA MORGANII (A)  Final      Susceptibility   Morganella morganii - MIC*    AMPICILLIN >=32 RESISTANT Resistant     ERTAPENEM <=0.12 SENSITIVE Sensitive     CIPROFLOXACIN <=0.06 SENSITIVE Sensitive     GENTAMICIN <=1 SENSITIVE Sensitive     NITROFURANTOIN 128 RESISTANT Resistant     TRIMETH/SULFA <=20 SENSITIVE Sensitive     AMPICILLIN/SULBACTAM >=32 RESISTANT Resistant     PIP/TAZO Value in next row Sensitive      <=4 SENSITIVEThis is a modified FDA-approved test that has been validated and its performance characteristics determined by the reporting laboratory.  This laboratory is certified under the Clinical Laboratory Improvement Amendments CLIA as qualified to perform high complexity clinical laboratory testing.    MEROPENEM Value in next row Sensitive      <=4 SENSITIVEThis is a modified FDA-approved test that has been validated and its performance characteristics determined by the reporting laboratory.  This laboratory is certified under the Clinical Laboratory Improvement Amendments CLIA as qualified to perform high complexity clinical laboratory testing.    * 40,000 COLONIES/mL [facility] MORGANII         Radiology Studies: CT TIBIA FIBULA RIGHT WO CONTRAST Result Date: 09/09/2024 EXAM: CT RIGHT LOWER EXTREMITY, WITHOUT IV CONTRAST 09/08/2024 03:04:00 PM  TECHNIQUE: Axial images were acquired through the right lower extremity without IV contrast. Reformatted images were reviewed. Automated exposure control, iterative reconstruction, and/or weight based adjustment of the mA/kV was utilized to reduce the radiation dose to as low as reasonably achievable. COMPARISON: 09/01/2024 CLINICAL HISTORY: Soft tissue infection suspected, lower leg, x-ray done. FINDINGS: BONES AND JOINTS: Severe degenerative chondral thinning, marginal spurring and subcortical sclerosis in the medial compartment of the knee. Mild chondral thinning in the lateral compartment. No acute fracture or focal osseous lesion. No dislocation. SOFT TISSUES: Atheromatous vascular calcifications. Mild circumferential subcutaneous edema in the distal calf. IMPRESSION: 1. Mild circumferential subcutaneous edema in the distal calf, cellulitis is not excluded. 2. Severe degenerative  chondral thinning, marginal spurring, and subcortical sclerosis in the medial compartment of the knee, with mild chondral thinning in the lateral compartment. 3. Atheromatous vascular calcifications. Electronically signed by: Ryan Salvage MD 09/09/2024 08:52 AM EST RP Workstation: [id number]        Scheduled Meds:  aspirin  EC  81 mg Oral Daily   budesonide   0.25 mg Nebulization BID   dapagliflozin  propanediol  10 mg Oral Daily   docusate sodium   100 mg Oral Daily   [START ON 09/10/2024] furosemide   40 mg Oral Daily   guaiFENesin   600 mg Oral BID   heparin   5,000 Units Subcutaneous Q8H   ipratropium-albuterol   3 mL Inhalation TID   linezolid   600 mg Oral Q12H   metoprolol  succinate  50 mg Oral Daily   polyethylene glycol  17 g Oral Daily   rosuvastatin   40 mg Oral Daily   sodium chloride  flush  3 mL Intravenous Q12H   Continuous Infusions:   LOS: 7 days    Time spent: 50 minutes spent on 09/09/2024 caring for this patient face-to-face including chart review, ordering labs/tests, documenting, discussion with  nursing staff, consultants, updating family and interview/physical exam    Camellia PARAS Minard Millirons, DO [facility] Available via Epic secure chat 7am-7pm After these hours, please refer to coverage provider listed on [email or web address] 09/09/2024, 12:21 PM   "

## 2024-09-09 NOTE — Progress Notes (Addendum)
 Physical Therapy Treatment Patient Details Name: [name] MRN: [id number] DOB: 1932-09-14 Today's Date: 09/09/2024   History of Present Illness Pt is an 89y.o. female admitted on 09/01/24 from home due to a fall, generalized weakness and malaise. PMH: HFpEF, COPD on 2L, HTN, dyslipidemia, and h/o CVA.    PT Comments  Pt is mobilizing well, she tolerated increased ambulation distance of 48' with RW, SpO2 95% on 2L O2 with walking. Pt performed seated BUE/LE strengthening exercises.     If plan is discharge home, recommend the following: A little help with walking and/or transfers;A little help with bathing/dressing/bathroom;Assistance with cooking/housework;Assist for transportation;Help with stairs or ramp for entrance   Can travel by private vehicle        Equipment Recommendations  None recommended by PT    Recommendations for Other Services       Precautions / Restrictions Precautions Precautions: Fall Recall of Precautions/Restrictions: Intact Precaution/Restrictions Comments: monitor O2 ( on home O2 at night and with exertion) Restrictions Weight Bearing Restrictions Per Provider Order: No     Mobility  Bed Mobility               General bed mobility comments: oob in recliner    Transfers Overall transfer level: Needs assistance Equipment used: Rolling walker (2 wheels) Transfers: Sit to/from Stand Sit to Stand: Supervision           General transfer comment: Cues for safe hand placement    Ambulation/Gait Ambulation/Gait assistance: Supervision Gait Distance (Feet): 62 Feet Assistive device: Rolling walker (2 wheels) Gait Pattern/deviations: Trunk flexed, Step-through pattern, Decreased stride length Gait velocity: decreased     General Gait Details: pt leaned forearms on RW, VCs for flexed posture (pt reports flexed trunk is her baseline); SpO2 95% on 2L O2 walking, distance limited by fatigue   Stairs             Wheelchair  Mobility     Tilt Bed    Modified Rankin (Stroke Patients Only)       Balance Overall balance assessment: Needs assistance Sitting-balance support: Feet supported, No upper extremity supported Sitting balance-Leahy Scale: Good     Standing balance support: Bilateral upper extremity supported, During functional activity, Reliant on assistive device for balance Standing balance-Leahy Scale: Fair                              Hotel Manager: No apparent difficulties  Cognition Arousal: Alert Behavior During Therapy: WFL for tasks assessed/performed   PT - Cognitive impairments: No apparent impairments                         Following commands: Intact      Cueing Cueing Techniques: Verbal cues  Exercises General Exercises - Lower Extremity Ankle Circles/Pumps: AROM, 10 reps, Both, Seated Long Arc Quad: AROM, Both, 20 reps, Seated Hip Flexion/Marching: AROM, Both, 15 reps, Seated Shoulder flexion: AROM, both, 10 reps, seated    General Comments        Pertinent Vitals/Pain Pain Assessment Pain Assessment: No/denies pain    Home Living                          Prior Function            PT Goals (current goals can now be found in the care plan section) Acute Rehab PT  Goals Patient Stated Goal: get moving again PT Goal Formulation: With patient Time For Goal Achievement: 09/16/24 Potential to Achieve Goals: Good Progress towards PT goals: Progressing toward goals    Frequency    Min 3X/week      PT Plan      Co-evaluation              AM-PAC PT 6 Clicks Mobility   Outcome Measure  Help needed turning from your back to your side while in a flat bed without using bedrails?: A Little Help needed moving from lying on your back to sitting on the side of a flat bed without using bedrails?: A Little Help needed moving to and from a bed to a chair (including a wheelchair)?: None Help  needed standing up from a chair using your arms (e.g., wheelchair or bedside chair)?: None   Help needed climbing 3-5 steps with a railing? : A Little 6 Click Score: 17    End of Session Equipment Utilized During Treatment: Gait belt;Oxygen Activity Tolerance: Patient tolerated treatment well;Patient limited by fatigue Patient left: in chair;with call bell/phone within reach;with chair alarm set Nurse Communication: Mobility status PT Visit Diagnosis: Unsteadiness on feet (R26.81);Muscle weakness (generalized) (M62.81);History of falling (Z91.81)     Time: 8679-8662 PT Time Calculation (min) (ACUTE ONLY): 17 min  Charges:    $Gait Training: 8-22 mins PT General Charges $$ ACUTE PT VISIT: 1 Visit                     Sylvan Delon Copp PT 09/09/2024  Acute Rehabilitation Services  Office [phone]

## 2024-09-10 ENCOUNTER — Other Ambulatory Visit (HOSPITAL_COMMUNITY): Payer: Self-pay

## 2024-09-10 ENCOUNTER — Other Ambulatory Visit: Payer: Self-pay

## 2024-09-10 LAB — BASIC METABOLIC PANEL WITH GFR
Anion gap: 7 (ref 5–15)
BUN: 18 mg/dL (ref 8–23)
CO2: 36 mmol/L — ABNORMAL HIGH (ref 22–32)
Calcium: 9.4 mg/dL (ref 8.9–10.3)
Chloride: 89 mmol/L — ABNORMAL LOW (ref 98–111)
Creatinine, Ser: 0.94 mg/dL (ref 0.44–1.00)
GFR, Estimated: 58 mL/min — ABNORMAL LOW
Glucose, Bld: 98 mg/dL (ref 70–99)
Potassium: 4.9 mmol/L (ref 3.5–5.1)
Sodium: 131 mmol/L — ABNORMAL LOW (ref 135–145)

## 2024-09-10 MED ORDER — DOCUSATE SODIUM 100 MG PO CAPS
100.0000 mg | ORAL_CAPSULE | Freq: Two times a day (BID) | ORAL | Status: DC
  Administered 2024-09-10: 100 mg via ORAL
  Filled 2024-09-10: qty 1

## 2024-09-10 MED ORDER — POLYETHYLENE GLYCOL 3350 17 GM/SCOOP PO POWD
17.0000 g | Freq: Every day | ORAL | 0 refills | Status: AC | PRN
  Filled 2024-09-10: qty 238, 14d supply, fill #0

## 2024-09-10 MED ORDER — FUROSEMIDE 40 MG PO TABS
40.0000 mg | ORAL_TABLET | Freq: Every day | ORAL | 0 refills | Status: AC
  Filled 2024-09-10 (×2): qty 90, 90d supply, fill #0

## 2024-09-10 MED ORDER — LINEZOLID 600 MG PO TABS
600.0000 mg | ORAL_TABLET | Freq: Two times a day (BID) | ORAL | 0 refills | Status: AC
  Filled 2024-09-10: qty 14, 7d supply, fill #0

## 2024-09-10 MED ORDER — ROSUVASTATIN CALCIUM 40 MG PO TABS
40.0000 mg | ORAL_TABLET | Freq: Every day | ORAL | 0 refills | Status: AC
  Filled 2024-09-10: qty 90, 90d supply, fill #0

## 2024-09-10 NOTE — Discharge Summary (Signed)
 " Physician Discharge Summary  [name] FMW:[id number] DOB: 1932/11/23 DOA: 09/01/2024  PCP: Arloa [name], MD  Admit date: 09/01/2024 Discharge date: 09/10/2024  Admitted From: Home Disposition: Home with home health  Recommendations for Outpatient Follow-up:  Follow up with PCP in 1-2 weeks Follow-up with cardiology as scheduled on 09/23/2024 Encouraged to maintain weight log to bring to next PCP/specialist visit Please obtain BMP in one week to ensure renal function remains stable  Home Health: PT/OT Equipment/Devices: At baseline on oxygen via 2 L nasal cannula  Discharge Condition: Stable CODE STATUS: DNR Diet recommendation: Heart healthy diet  History of present illness:  [name] is a 89 y.o. female with past medical history significant for chronic diastolic congestive heart failure, HTN, HLD, COPD on 2 L nasal cannula at baseline, history of CVA who presented to [facility] ED from home via EMS on 09/01/2024 with shortness of breath, right lower extremity edema/redness.  Recently started on amoxicillin by PCP for cellulitis.   In the ED, temperature 98.5 F, HR 77, RR 20, BP 173/81, SpO2 99% on room air.  WBC 8.7, hemoglobin 14.0, platelet count 151.  Sodium 142, potassium 4.1, chloride 95, CO2 39, glucose 105, BUN 17, creatinine 0.82.  BNP 3195.0.  High-sensitivity troponin 20 followed by 21.  Chest x-ray with borderline cardiomegaly, probable atelectasis left lung base.  Vascular duplex ultrasound right lower extremity negative for DVT.  Patient was given DuoNeb, furosemide  40 mg IV x 1.  TRH consulted for admission for further evaluation management of CHF exacerbation, right lower extremity cellulitis.  Hospital course:  Acute on chronic diastolic congestive heart failure HFmrEF HTN Patient presenting with mild shortness of breath, lower extreme edema.  Dry weight reported around 298 pounds.  Chest x-ray with cardiomegaly, atelectasis left lung base.   proBNP elevated 3195.  TTE with LVEF 40 to 45% (50-55% 08/2023), grade 1 diastolic dysfunction.  Was started on IV diuresis with improvement of her symptoms and weight.  Patient will transition back to furosemide  40 mg p.o. daily.  Continue metoprolol  succinate 50 mg p.o. daily, Farxiga  10 mg p.o. daily.  Outpatient follow-up with cardiology as scheduled on 09/23/2024.  Patient instructed  to maintain weight log to bring to next PCP visit.  Weight at time of discharge 89.4 kg.   Right lower extremity cellulitis Patient initially started by PCP on amoxicillin.  Initially treated with Duricef with worsening symptoms.  CT right lower extremity without contrast with mild circumferential subcutaneous edema distal calf consistent with cellulitis.  Continue linezolid  600 mg PO q12h, pleat 10-day course.   COPD Chronic respiratory failure on 2 L oxygen at baseline Continue Pulmicort  and albuterol  neb treatments   Hx CVA Aspirin  81 mg p.o. daily   Weakness/debility/deconditioning: PT and OT recommend home health.   Discharge Diagnoses:  Principal Problem:   Acute on chronic diastolic CHF (congestive heart failure) (HCC) Active Problems:   Essential hypertension   COPD (chronic obstructive pulmonary disease) (HCC)   Body mass index (BMI) 37.0-37.9, adult   Chronic respiratory failure with hypoxia and hypercapnia (HCC)   History of stroke   Cellulitis    Discharge Instructions  Discharge Instructions     Call MD for:  difficulty breathing, headache or visual disturbances   Complete by: As directed    Call MD for:  extreme fatigue   Complete by: As directed    Call MD for:  persistant dizziness or light-headedness   Complete by: As directed  Call MD for:  persistant nausea and vomiting   Complete by: As directed    Call MD for:  severe uncontrolled pain   Complete by: As directed    Call MD for:  temperature >100.4   Complete by: As directed    Increase activity slowly   Complete by:  As directed       Allergies as of 09/10/2024       Reactions   Atorvastatin Other (See Comments)   Myalgias   Ceftin [cefuroxime] Diarrhea   Cefuroxime Axetil Diarrhea   Clindamycin Hcl Itching   Clindamycin/lincomycin Itching   Dapagliflozin     Other Reaction(s): numbness/tingling   Moxifloxacin Itching   Simvastatin  Other (See Comments)   myalgias   Doxycycline Hyclate Diarrhea, Rash   Penicillins Rash        Medication List     TAKE these medications    albuterol  (2.5 MG/3ML) 0.083% nebulizer solution Commonly known as: PROVENTIL  Take 3 mLs (2.5 mg total) by nebulization every 4 (four) hours as needed for wheezing or shortness of breath.   albuterol  108 (90 Base) MCG/ACT inhaler Commonly known as: VENTOLIN  HFA Inhale 2 puffs into the lungs every 6 (six) hours as needed for wheezing or shortness of breath.   aspirin  EC 81 MG tablet Take 1 tablet (81 mg total) by mouth daily. Swallow whole.   budesonide  0.25 MG/2ML nebulizer solution Commonly known as: PULMICORT  USE 1 VIAL VIA NEBULIZER IN THE MORNING AND AT BEDTIME   docusate sodium  100 MG capsule Commonly known as: COLACE Take 100 mg by mouth daily.   Farxiga  10 MG Tabs tablet Generic drug: dapagliflozin  propanediol Take 1 tablet (10 mg total) by mouth daily.   furosemide  40 MG tablet Commonly known as: LASIX  Take 1 tablet (40 mg total) by mouth daily. What changed: when to take this   ipratropium-albuterol  0.5-2.5 (3) MG/3ML Soln Commonly known as: DUONEB Inhale 3 mLs into the lungs 3 (three) times daily.   linezolid  600 MG tablet Commonly known as: ZYVOX  Take 1 tablet (600 mg total) by mouth every 12 (twelve) hours for 7 days.   metoprolol  succinate 50 MG 24 hr tablet Commonly known as: TOPROL -XL Take 1 tablet (50 mg total) by mouth daily. Take with or immediately following a meal.   OXYGEN Inhale into the lungs as directed. 2L   polyethylene glycol 17 g packet Commonly known as: MIRALAX  /  GLYCOLAX  Take 17 g by mouth daily as needed.   REFRESH DRY EYE THERAPY OP Apply 2 drops to eye as needed (dry eyes).   rosuvastatin  40 MG tablet Commonly known as: CRESTOR  Take 1 tablet (40 mg total) by mouth daily. What changed:  medication strength how much to take   VITAMIN D  PO Take 1,000 Units by mouth daily.        Follow-up Information     Arloa [name], MD. Schedule an appointment as soon as possible for a visit in 1 week(s).   Specialty: Family Medicine Contact information: [id number] [place] [address] [phone]         Rana Lum CROME, NP. Go on 09/23/2024.   Specialty: Cardiology Contact information: [address] [place] [address] [phone]                Allergies[1]  Consultations: None   Procedures/Studies: CT TIBIA FIBULA RIGHT WO CONTRAST Result Date: 09/09/2024 EXAM: CT RIGHT LOWER EXTREMITY, WITHOUT IV CONTRAST 09/08/2024 03:04:00 PM TECHNIQUE: Axial images were  acquired through the right lower extremity without IV contrast. Reformatted images were reviewed. Automated exposure control, iterative reconstruction, and/or weight based adjustment of the mA/kV was utilized to reduce the radiation dose to as low as reasonably achievable. COMPARISON: 09/01/2024 CLINICAL HISTORY: Soft tissue infection suspected, lower leg, x-ray done. FINDINGS: BONES AND JOINTS: Severe degenerative chondral thinning, marginal spurring and subcortical sclerosis in the medial compartment of the knee. Mild chondral thinning in the lateral compartment. No acute fracture or focal osseous lesion. No dislocation. SOFT TISSUES: Atheromatous vascular calcifications. Mild circumferential subcutaneous edema in the distal calf. IMPRESSION: 1. Mild circumferential subcutaneous edema in the distal calf, cellulitis is not excluded. 2. Severe degenerative chondral thinning, marginal spurring, and subcortical sclerosis in the medial  compartment of the knee, with mild chondral thinning in the lateral compartment. 3. Atheromatous vascular calcifications. Electronically signed by: Ryan Salvage MD 09/09/2024 08:52 AM EST RP Workstation: [id number]   ECHOCARDIOGRAM COMPLETE Result Date: 09/02/2024    ECHOCARDIOGRAM REPORT   Patient Name:   [name] Date of Exam: 09/02/2024 Medical Rec #:  [id number]     Height:       59.0 in Accession #:    [id number]    Weight:       199.4 lb Date of Birth:  1933-01-29     BSA:          1.842 m Patient Age:    89 years      BP:           71/55 mmHg Patient Gender: F             HR:           73 bpm. Exam Location:  Inpatient Procedure: 2D Echo, Cardiac Doppler, Color Doppler and Intracardiac            Opacification Agent (Both Spectral and Color Flow Doppler were            utilized during procedure). Indications:    Chest pain  History:        Patient has prior history of Echocardiogram examinations, most                 recent 08/22/2023.  Sonographer:    Odella Brewster Referring Phys: VERGIE ANASTASSIA DOUTOVA IMPRESSIONS  1. Left ventricular ejection fraction, by estimation, is 40 to 45%. The left ventricle has mildly decreased function. The left ventricle demonstrates global hypokinesis. There is mild concentric left ventricular hypertrophy. Left ventricular diastolic parameters are consistent with Grade I diastolic dysfunction (impaired relaxation).  2. Right ventricular systolic function is normal. The right ventricular size is normal. Tricuspid regurgitation signal is inadequate for assessing PA pressure.  3. The mitral valve is degenerative. No evidence of mitral valve regurgitation. At risk for mitral stenosis mitral stenosis. The mean mitral valve gradient is 3.0 mmHg.  4. The aortic valve is normal in structure. Aortic valve regurgitation is not visualized. No aortic stenosis is present. FINDINGS  Left Ventricle: Left ventricular ejection fraction, by estimation, is 40 to 45%. The left ventricle  has mildly decreased function. The left ventricle demonstrates global hypokinesis. The left ventricular internal cavity size was normal in size. There is  mild concentric left ventricular hypertrophy. Abnormal (paradoxical) septal motion consistent with post-operative status. Left ventricular diastolic parameters are consistent with Grade I diastolic dysfunction (impaired relaxation). Right Ventricle: The right ventricular size is normal. No increase in right ventricular wall thickness. Right ventricular systolic function is normal. Tricuspid regurgitation signal is  inadequate for assessing PA pressure. Left Atrium: Left atrial size was normal in size. Right Atrium: Right atrial size was normal in size. Pericardium: There is no evidence of pericardial effusion. Presence of epicardial fat layer. Mitral Valve: The mitral valve is degenerative in appearance. No evidence of mitral valve regurgitation. At risk for mitral stenosis mitral valve stenosis. MV peak gradient, 6.6 mmHg. The mean mitral valve gradient is 3.0 mmHg. Tricuspid Valve: The tricuspid valve is normal in structure. Tricuspid valve regurgitation is trivial. No evidence of tricuspid stenosis. Aortic Valve: The aortic valve is normal in structure. Aortic valve regurgitation is not visualized. No aortic stenosis is present. Aortic valve mean gradient measures 4.0 mmHg. Aortic valve peak gradient measures 8.1 mmHg. Aortic valve area, by VTI measures 1.97 cm. Pulmonic Valve: The pulmonic valve was not well visualized. Pulmonic valve regurgitation is not visualized. No evidence of pulmonic stenosis. Aorta: The aortic root and ascending aorta are structurally normal, with no evidence of dilitation. IAS/Shunts: No atrial level shunt detected by color flow Doppler.  LEFT VENTRICLE PLAX 2D LVIDd:         4.40 cm      Diastology LVIDs:         3.30 cm      LV e' medial:    4.79 cm/s LV PW:         0.90 cm      LV E/e' medial:  15.5 LV IVS:        1.30 cm      LV  e' lateral:   4.79 cm/s LVOT diam:     2.00 cm      LV E/e' lateral: 15.5 LV SV:         52 LV SV Index:   28 LVOT Area:     3.14 cm LV IVRT:       100 msec  LV Volumes (MOD) LV vol d, MOD A2C: 107.0 ml LV vol d, MOD A4C: 93.2 ml LV vol s, MOD A2C: 61.2 ml LV vol s, MOD A4C: 52.7 ml LV SV MOD A2C:     45.8 ml LV SV MOD A4C:     93.2 ml LV SV MOD BP:      43.6 ml RIGHT VENTRICLE            IVC RV S prime:     9.36 cm/s  IVC diam: 1.80 cm TAPSE (M-mode): 2.1 cm LEFT ATRIUM             Index LA diam:        4.00 cm 2.17 cm/m LA Vol (A2C):   59.0 ml 32.03 ml/m LA Vol (A4C):   40.7 ml 22.10 ml/m LA Biplane Vol: 51.7 ml 28.07 ml/m  AORTIC VALVE                    PULMONIC VALVE AV Area (Vmax):    1.66 cm     PV Vmax:       0.96 m/s AV Area (Vmean):   1.76 cm     PV Peak grad:  3.7 mmHg AV Area (VTI):     1.97 cm AV Vmax:           142.00 cm/s AV Vmean:          95.600 cm/s AV VTI:            0.262 m AV Peak Grad:      8.1 mmHg AV Mean Grad:  4.0 mmHg LVOT Vmax:         75.10 cm/s LVOT Vmean:        53.600 cm/s LVOT VTI:          0.164 m LVOT/AV VTI ratio: 0.63  AORTA Ao Root diam: 3.10 cm MITRAL VALVE MV Area (PHT): 4.12 cm     SHUNTS MV Area VTI:   2.06 cm     Systemic VTI:  0.16 m MV Peak grad:  6.6 mmHg     Systemic Diam: 2.00 cm MV Mean grad:  3.0 mmHg MV Vmax:       1.28 m/s MV Vmean:      83.4 cm/s MV Decel Time: 184 msec MV E velocity: 74.10 cm/s MV A velocity: 113.00 cm/s MV E/A ratio:  0.66 Kardie Tobb DO Electronically signed by Dub Huntsman DO Signature Date/Time: 09/02/2024/2:41:36 PM    Final    DG Tibia/Fibula Right Result Date: 09/01/2024 EXAM: 3 VIEW(S) XRAY OF THE KNEE 09/01/2024 08:24:00 PM COMPARISON: None available. CLINICAL HISTORY: Cellulitis to both tib fibs FINDINGS: BONES AND JOINTS: No acute fracture. No malalignment. Degenerative changes of the knee. SOFT TISSUES: Moderate diffuse subcutaneous soft tissue edema. Vascular calcification. IMPRESSION: 1. Moderate diffuse subcutaneous  soft tissue edema. No acute osseous abnormality. Electronically signed by: Pinkie Pebbles MD 09/01/2024 08:33 PM EST RP Workstation: [id number]   DG Tibia/Fibula Left Result Date: 09/01/2024 EXAM: 1 VIEW XRAY OF THE LEFT TIBIA AND FIBULA 09/01/2024 08:24:00 PM COMPARISON: None available. CLINICAL HISTORY: Cellulitis to both tib fibs FINDINGS: BONES AND JOINTS: No acute fracture. No malalignment. SOFT TISSUES: Mild diffuse soft tissue edema. IMPRESSION: 1. Mild diffuse soft tissue edema. No acute osseous abnormality. Electronically signed by: Pinkie Pebbles MD 09/01/2024 08:31 PM EST RP Workstation: [id number]   VAS US  LOWER EXTREMITY VENOUS (DVT) (7a-5p) Result Date: 09/01/2024  Lower Venous DVT Study Patient Name:  [name]  Date of Exam:   09/01/2024 Medical Rec #: [id number]      Accession #:    [id number] Date of Birth: Aug 07, 1933      Patient Gender: F Patient Age:   74 years Exam Location:  [facility] Procedure:      VAS US  LOWER EXTREMITY VENOUS (DVT) Referring Phys: JOSHUA LONG --------------------------------------------------------------------------------  Indications: Pain, Erythema, and Swelling. Other Indications: Cellulitis. Limitations: Poor ultrasound/tissue interface. Comparison Study: Previous exam on 08/22/2023 was negative for DVT Performing Technologist: Ezzie Potters RVT, RDMS  Examination Guidelines: A complete evaluation includes B-mode imaging, spectral Doppler, color Doppler, and power Doppler as needed of all accessible portions of each vessel. Bilateral testing is considered an integral part of a complete examination. Limited examinations for reoccurring indications may be performed as noted. The reflux portion of the exam is performed with the patient in reverse Trendelenburg.  +---------+---------------+---------+-----------+----------+--------------+ RIGHT    CompressibilityPhasicitySpontaneityPropertiesThrombus Aging  +---------+---------------+---------+-----------+----------+--------------+ CFV      Full                    Yes                                 +---------+---------------+---------+-----------+----------+--------------+ SFJ      Full                                                        +---------+---------------+---------+-----------+----------+--------------+  FV Prox  Full                    Yes                                 +---------+---------------+---------+-----------+----------+--------------+ FV Mid   Full                    Yes                                 +---------+---------------+---------+-----------+----------+--------------+ FV DistalFull                    Yes                                 +---------+---------------+---------+-----------+----------+--------------+ PFV      Full                                                        +---------+---------------+---------+-----------+----------+--------------+ POP      Full                    Yes                                 +---------+---------------+---------+-----------+----------+--------------+ PTV      Full                                                        +---------+---------------+---------+-----------+----------+--------------+ PERO     Full                                                        +---------+---------------+---------+-----------+----------+--------------+   +----+---------------+---------+-----------+----------+--------------+ LEFTCompressibilityPhasicitySpontaneityPropertiesThrombus Aging +----+---------------+---------+-----------+----------+--------------+ CFV Full           Yes      Yes                                 +----+---------------+---------+-----------+----------+--------------+    Summary: RIGHT: - There is no evidence of deep vein thrombosis in the lower extremity.  - No cystic structure found in the popliteal fossa.   LEFT: - No evidence of common femoral vein obstruction.   *See table(s) above for measurements and observations. Electronically signed by Gaile New MD on 09/01/2024 at 5:42:28 PM.    Final    DG Chest 2 View Result Date: 09/01/2024 CLINICAL DATA:  Shortness of breath EXAM: CHEST - 2 VIEW COMPARISON:  09/09/2023 FINDINGS: Borderline cardiac enlargement. Aortic atherosclerosis. Probable atelectasis left lung base. No sizable pleural effusion IMPRESSION: Borderline cardiomegaly. Probable atelectasis left lung base. Electronically Signed   By: Luke Bun M.D.   On: 09/01/2024 15:50  Subjective: Patient seen examined bedside, sitting in bedside chair.  No complaints.  Discharging home.  Has a follow-up appoint with cardiology scheduled on 09/23/2024.  Denies headache, no dizziness, no chest pain, no palpitations, no shortness of breath, no abdominal pain, no fever/chills/night sweats, no nausea/vomit/diarrhea.  No acute events overnight per nurse staff.  Discharge Exam: Vitals:   09/10/24 0426 09/10/24 0732  BP: (!) 142/74   Pulse: 73   Resp: 15   Temp: 97.6 F (36.4 C)   SpO2: 95% 98%   Vitals:   09/10/24 0000 09/10/24 0426 09/10/24 0500 09/10/24 0732  BP:  (!) 142/74    Pulse:  73    Resp:  15    Temp:  97.6 F (36.4 C)    TempSrc:  Oral    SpO2: 96% 95%  98%  Weight:   89.4 kg   Height:        Physical Exam: GEN: NAD, alert and oriented x 3, elderly in appearance, obese HEENT: NCAT, PERRL, EOMI, sclera clear, MMM PULM: CTAB w/o wheezes/crackles, normal respiratory effort, on 2 L nasal cannula with SpO2 97% at rest CV: RRR w/o M/G/R GI: abd soft, NTND, NABS, no R/G/M MSK: no peripheral edema, moves all extremity dependently with preserved muscle strength, noted chronic venous changes bilateral lower extremities, right lateral leg wound as depicted below NEURO: CN II-XII intact, no focal deficits, sensation to light touch intact PSYCH: normal mood/affect Integumentary:  Chronic venous changes bilateral lower extremities, wound right lateral leg as depicted below without surrounding fluctuance, pain or worsening erythema; otherwise no other concerning rashes/lesions/wounds noted on exposed skin surfaces  1/28   1/28    The results of significant diagnostics from this hospitalization (including imaging, microbiology, ancillary and laboratory) are listed below for reference.     Microbiology: Recent Results (from the past 240 hours)  Surgical pcr screen     Status: None   Collection Time: 09/01/24 10:08 PM  Result Value Ref Range Status   MRSA, PCR NEGATIVE NEGATIVE Final   Staphylococcus aureus NEGATIVE NEGATIVE Final    Comment: (NOTE) The Xpert SA Assay (FDA approved for NASAL specimens in patients 35 years of age and older), is one component of a comprehensive surveillance program. It is not intended to diagnose infection nor to guide or monitor treatment. Performed at [facility], [address] [place], [place] [address]   Urine Culture (for pregnant, neutropenic or urologic patients or patients with an indwelling urinary catheter)     Status: Abnormal   Collection Time: 09/01/24 11:40 PM   Specimen: Urine, Clean Catch  Result Value Ref Range Status   Specimen Description   Final    URINE, CLEAN CATCH Performed at [facility], [address] [place], [place] [address]    Special Requests   Final    NONE Performed at [facility], [address] [place], [place] [address]    Culture 40,000 COLONIES/mL [facility] MORGANII (A)  Final   Report Status 09/04/2024 FINAL  Final   Organism ID, Bacteria MORGANELLA MORGANII (A)  Final      Susceptibility   Morganella morganii - MIC*    AMPICILLIN >=32 RESISTANT Resistant     ERTAPENEM <=0.12 SENSITIVE Sensitive     CIPROFLOXACIN <=0.06 SENSITIVE Sensitive     GENTAMICIN <=1 SENSITIVE Sensitive     NITROFURANTOIN 128 RESISTANT  Resistant     TRIMETH/SULFA <=20 SENSITIVE Sensitive     AMPICILLIN/SULBACTAM >=32 RESISTANT  Resistant     PIP/TAZO Value in next row Sensitive      <=4 SENSITIVEThis is a modified FDA-approved test that has been validated and its performance characteristics determined by the reporting laboratory.  This laboratory is certified under the Clinical Laboratory Improvement Amendments CLIA as qualified to perform high complexity clinical laboratory testing.    MEROPENEM Value in next row Sensitive      <=4 SENSITIVEThis is a modified FDA-approved test that has been validated and its performance characteristics determined by the reporting laboratory.  This laboratory is certified under the Clinical Laboratory Improvement Amendments CLIA as qualified to perform high complexity clinical laboratory testing.    * 40,000 COLONIES/mL MORGANELLA MORGANII     Labs: BNP (last 3 results) No results for input(s): BNP in the last 8760 hours. Basic Metabolic Panel: Recent Labs  Lab 09/05/24 0456 09/06/24 0528 09/07/24 0431 09/08/24 0525 09/09/24 0527 09/10/24 0510  NA 136 131* 131* 129* 135 131*  K 4.9 4.5 4.4 4.1 4.6 4.9  CL 90* 87* 88* 86* 91* 89*  CO2 43* 40* 36* 38* 36* 36*  GLUCOSE 108* 105* 90 103* 101* 98  BUN 10 11 17 18 18 18   CREATININE 0.79 0.84 0.93 0.95 1.13* 0.94  CALCIUM  9.2 9.6 9.3 9.3 9.3 9.4  MG 2.4 2.6* 2.7* 2.6* 2.7*  --   PHOS 3.5 4.0 3.5 4.0 4.5  --    Liver Function Tests: Recent Labs  Lab 09/04/24 0512 09/05/24 0456 09/08/24 0525 09/09/24 0527  AST 22 23 32 38  ALT 5 5 15 20   ALKPHOS 48 50 50 52  BILITOT 0.4 0.4 0.4 0.4  PROT 6.7 6.5 6.6 7.1  ALBUMIN 3.6 3.6 3.8 4.0   No results for input(s): LIPASE, AMYLASE in the last 168 hours. No results for input(s): AMMONIA in the last 168 hours. CBC: Recent Labs  Lab 09/04/24 0512 09/05/24 0456 09/06/24 0528 09/07/24 1054 09/08/24 0525 09/09/24 0527  WBC 7.5 7.8 7.8 6.0 7.4 6.3  NEUTROABS 4.8 4.8  --  4.1  4.2 3.5  HGB 13.7 13.5 14.1 13.8 13.3 14.8  HCT 46.5* 45.2 45.4 43.5 42.6 48.1*  MCV 98.7 98.7 95.0 93.5 95.5 95.8  PLT 145* 136* 130* 119* 131* 122*   Cardiac Enzymes: No results for input(s): CKTOTAL, CKMB, CKMBINDEX, TROPONINI in the last 168 hours. BNP: Invalid input(s): POCBNP CBG: No results for input(s): GLUCAP in the last 168 hours. D-Dimer No results for input(s): DDIMER in the last 72 hours. Hgb A1c No results for input(s): HGBA1C in the last 72 hours. Lipid Profile No results for input(s): CHOL, HDL, LDLCALC, TRIG, CHOLHDL, LDLDIRECT in the last 72 hours. Thyroid  function studies No results for input(s): TSH, T4TOTAL, T3FREE, THYROIDAB in the last 72 hours.  Invalid input(s): FREET3 Anemia work up No results for input(s): VITAMINB12, FOLATE, FERRITIN, TIBC, IRON, RETICCTPCT in the last 72 hours. Urinalysis    Component Value Date/Time   COLORURINE STRAW (A) 09/07/2024 2119   APPEARANCEUR CLEAR 09/07/2024 2119   LABSPEC 1.007 09/07/2024 2119   PHURINE 6.0 09/07/2024 2119   GLUCOSEU >=500 (A) 09/07/2024 2119   HGBUR NEGATIVE 09/07/2024 2119   BILIRUBINUR NEGATIVE 09/07/2024 2119   KETONESUR NEGATIVE 09/07/2024 2119   PROTEINUR NEGATIVE 09/07/2024 2119   UROBILINOGEN 0.2 01/27/2009 1018   NITRITE NEGATIVE 09/07/2024 2119   LEUKOCYTESUR TRACE (A) 09/07/2024 2119   Sepsis Labs Recent Labs  Lab 09/06/24 0528 09/07/24 1054 09/08/24 0525 09/09/24 0527  WBC 7.8 6.0 7.4 6.3  Microbiology Recent Results (from the past 240 hours)  Surgical pcr screen     Status: None   Collection Time: 09/01/24 10:08 PM  Result Value Ref Range Status   MRSA, PCR NEGATIVE NEGATIVE Final   Staphylococcus aureus NEGATIVE NEGATIVE Final    Comment: (NOTE) The Xpert SA Assay (FDA approved for NASAL specimens in patients 20 years of age and older), is one component of a comprehensive surveillance program. It is not intended to  diagnose infection nor to guide or monitor treatment. Performed at [facility], [address], [place], [place] [address]   Urine Culture (for pregnant, neutropenic or urologic patients or patients with an indwelling urinary catheter)     Status: Abnormal   Collection Time: 09/01/24 11:40 PM   Specimen: Urine, Clean Catch  Result Value Ref Range Status   Specimen Description   Final    URINE, CLEAN CATCH Performed at [facility], [address], [place], [place] [address]    Special Requests   Final    NONE Performed at [facility], [address] [place], [place] [address]    Culture 40,000 COLONIES/mL [facility] MORGANII (A)  Final   Report Status 09/04/2024 FINAL  Final   Organism ID, Bacteria MORGANELLA MORGANII (A)  Final      Susceptibility   Morganella morganii - MIC*    AMPICILLIN >=32 RESISTANT Resistant     ERTAPENEM <=0.12 SENSITIVE Sensitive     CIPROFLOXACIN <=0.06 SENSITIVE Sensitive     GENTAMICIN <=1 SENSITIVE Sensitive     NITROFURANTOIN 128 RESISTANT Resistant     TRIMETH/SULFA <=20 SENSITIVE Sensitive     AMPICILLIN/SULBACTAM >=32 RESISTANT Resistant     PIP/TAZO Value in next row Sensitive      <=4 SENSITIVEThis is a modified FDA-approved test that has been validated and its performance characteristics determined by the reporting laboratory.  This laboratory is certified under the Clinical Laboratory Improvement Amendments CLIA as qualified to perform high complexity clinical laboratory testing.    MEROPENEM Value in next row Sensitive      <=4 SENSITIVEThis is a modified FDA-approved test that has been validated and its performance characteristics determined by the reporting laboratory.  This laboratory is certified under the Clinical Laboratory Improvement Amendments CLIA as qualified to perform high complexity clinical laboratory testing.    * 40,000 COLONIES/mL MORGANELLA MORGANII     Time  coordinating discharge: Over 30 minutes  SIGNED:   Camellia PARAS Betania Dizon, DO  [facility] 09/10/2024, 8:36 AM     [1]  Allergies Allergen Reactions   Atorvastatin Other (See Comments)    Myalgias   Ceftin [Cefuroxime] Diarrhea   Cefuroxime Axetil Diarrhea   Clindamycin Hcl Itching   Clindamycin/Lincomycin Itching   Dapagliflozin      Other Reaction(s): numbness/tingling   Moxifloxacin Itching   Simvastatin  Other (See Comments)    myalgias   Doxycycline Hyclate Diarrhea and Rash   Penicillins Rash   "

## 2024-09-10 NOTE — TOC Transition Note (Addendum)
 Transition of Care [facility]) - Discharge Note   Patient Details  Name: [name] MRN: [id number] Date of Birth: Jun 03, 1933  [facility] CM/SW Contact:  Bascom Service, RN Phone Number: 09/10/2024, 10:03 AM   Clinical Narrative: Patient for return home w/[facility].Already has, home 02,& private duty care-private duty aide plans to drive to patients home to [place] if able to get out of driveway d/t ice on road. Await to hear from nsg that patient ready for d/c by PTAR-confirmed address.  -3p PTAR called. No further CM needs.     Final next level of care: Long Term Nursing Home Barriers to Discharge: No Barriers Identified   Patient Goals and CMS Choice Patient states their goals for this hospitalization and ongoing recovery are:: Home CMS Medicare.gov Compare Post Acute Care list provided to:: Patient Choice offered to / list presented to : Patient [place] ownership interest in [facility].provided to:: Patient    Discharge Placement                       Discharge Plan and Services Additional resources added to the After Visit Summary for     Discharge Planning Services: CM Consult Post Acute Care Choice: Home Health                    HH Arranged: PT, OT [facility] Agency: [facility] Date [facility] Agency Contacted: 09/10/24 Time HH Agency Contacted: 1002 Representative spoke with at [facility] Agency: [facility]  Social Drivers of Health (SDOH) Interventions SDOH Screenings   Food Insecurity: No Food Insecurity (09/02/2024)  Housing: Low Risk (09/02/2024)  Transportation Needs: No Transportation Needs (09/02/2024)  Utilities: Not At Risk (09/02/2024)  Social Connections: Moderately Isolated (09/02/2024)  Tobacco Use: Medium Risk (09/03/2024)     Readmission Risk Interventions     No data to display

## 2024-09-10 NOTE — Plan of Care (Signed)
" °  Problem: Health Behavior/Discharge Planning: Goal: Ability to manage health-related needs will improve 09/10/2024 1502 by Alaina Dozier PARAS, RN Outcome: Adequate for Discharge 09/10/2024 0803 by Alaina Dozier PARAS, RN Outcome: Progressing   Problem: Clinical Measurements: Goal: Respiratory complications will improve 09/10/2024 1502 by Alaina Dozier PARAS, RN Outcome: Adequate for Discharge 09/10/2024 0803 by Alaina Dozier PARAS, RN Outcome: Progressing   Problem: Education: Goal: Ability to demonstrate management of disease process will improve 09/10/2024 1502 by Alaina Dozier PARAS, RN Outcome: Adequate for Discharge 09/10/2024 0803 by Alaina Dozier PARAS, RN Outcome: Progressing Goal: Ability to verbalize understanding of medication therapies will improve 09/10/2024 1502 by Alaina Dozier PARAS, RN Outcome: Adequate for Discharge 09/10/2024 0803 by Alaina Dozier PARAS, RN Outcome: Progressing Goal: Individualized Educational Video(s) Outcome: Adequate for Discharge   Problem: Health Behavior/Discharge Planning: Goal: Ability to manage health-related needs will improve 09/10/2024 1502 by Alaina Dozier PARAS, RN Outcome: Adequate for Discharge 09/10/2024 0803 by Alaina Dozier PARAS, RN Outcome: Progressing   "

## 2024-09-10 NOTE — Progress Notes (Signed)
" °   09/10/24 0000  BiPAP/CPAP/SIPAP  $ Non-Invasive Home Ventilator  Subsequent  BiPAP/CPAP/SIPAP Pt Type Adult  BiPAP/CPAP/SIPAP DREAMSTATIOND  Mask Type Full face mask  Dentures removed? Not applicable  Flow Rate 2 lpm  Patient Home Machine No  Patient Home Mask No  Patient Home Tubing No  Auto Titrate Yes  BiPAP/CPAP /SiPAP Vitals  SpO2 96 %    "

## 2024-09-10 NOTE — Care Management Important Message (Signed)
 Important Message  Patient Details IM Letter given. Name: [name] MRN: [id number] Date of Birth: Mar 06, 1933   Important Message Given:  Yes - Medicare IM     Melba Ates 09/10/2024, 10:11 AM

## 2024-09-10 NOTE — Progress Notes (Signed)
 Occupational Therapy Treatment Patient Details Name: [name] MRN: [id number] DOB: June 30, 1933 Today's Date: 09/10/2024   History of present illness Pt is an 89 yr old female admitted on 09/01/24 from home due to a fall, generalized weakness and malaise. PMH: HFpEF, COPD on 2L, HTN, dyslipidemia, CVA.   OT comments  The pt was seen for ADL instruction, progression of functional activity and functional endurance training. She progressed to  performing toileting at bathroom level and upper body grooming in standing at sink level. She required SBA to CGA for such tasks. She was instructed on implementing therapeutic rest breaks as needed during tasks, pursed lip breathing exercises and appropriate pacing. She is making gradual functional progress. Continue OT plan of care. Home health OT is recommended.       If plan is discharge home, recommend the following:  A little help with walking and/or transfers;A little help with bathing/dressing/bathroom;Help with stairs or ramp for entrance   Equipment Recommendations  None recommended by OT    Recommendations for Other Services      Precautions / Restrictions Precautions Precautions: Fall Precaution/Restrictions Comments: monitor O2 ( on home O2 at night and with exertion) Restrictions Weight Bearing Restrictions Per Provider Order: No       Mobility Bed Mobility      General bed mobility comments: pt was received seated in the chair    Transfers Overall transfer level: Needs assistance Equipment used: Rolling walker (2 wheels) Transfers: Sit to/from Stand Sit to Stand: Supervision          Balance     Sitting balance-Leahy Scale: Good       Standing balance-Leahy Scale: Fair         ADL either performed or assessed with clinical judgement   ADL Overall ADL's : Needs assistance/impaired     Grooming: Set up;Supervision/safety;Standing Grooming Details (indicate cue type and reason): The pt performed hand  washing and teeth brushing standing at the sink. She required light cues for best walker placement and to correct forward flexed posture.        Toilet Transfer: Contact guard assist;Ambulation;BSC/3in1;Grab bars Statistician Details (indicate cue type and reason): The pt ambulated to and from the bathroom in her room using a RW. She used the grab bar for support when transferring onto and off the toilet. Toileting- Clothing Manipulation and Hygiene: Contact guard assist;Sit to/from stand Toileting - Clothing Manipulation Details (indicate cue type and reason): Toileting management performed at bathroom level.             Communication Communication Communication: No apparent difficulties   Cognition Arousal: Alert Behavior During Therapy: WFL for tasks assessed/performed Cognition: No apparent impairments        Following commands: Intact        Cueing   Cueing Techniques: Verbal cues             Pertinent Vitals/ Pain       Pain Assessment Pain Assessment: No/denies pain   Frequency  Min 2X/week        Progress Toward Goals  OT Goals(current goals can now be found in the care plan section)  Progress towards OT goals: Progressing toward goals  Acute Rehab OT Goals OT Goal Formulation: With patient Time For Goal Achievement: 09/16/24 Potential to Achieve Goals: Good  Plan         AM-PAC OT 6 Clicks Daily Activity     Outcome Measure   Help from another person eating meals?: None  Help from another person taking care of personal grooming?: A Little Help from another person toileting, which includes using toliet, bedpan, or urinal?: A Little Help from another person bathing (including washing, rinsing, drying)?: A Little Help from another person to put on and taking off regular upper body clothing?: None Help from another person to put on and taking off regular lower body clothing?: A Little 6 Click Score: 20    End of Session Equipment Utilized  During Treatment: Oxygen;Rolling walker (2 wheels);Gait belt  OT Visit Diagnosis: Unsteadiness on feet (R26.81);History of falling (Z91.81);Muscle weakness (generalized) (M62.81)   Activity Tolerance Patient tolerated treatment well   Patient Left in chair;with chair alarm set;with call bell/phone within reach   Nurse Communication Mobility status        Time: 8991-8967 OT Time Calculation (min): 24 min  Charges: OT Evaluation $OT Eval Low Complexity: 1 Low OT Treatments $Self Care/Home Management : 8-22 mins $Therapeutic Activity: 8-22 mins    Delanna JINNY Lesches, OTR/L 09/10/2024, 3:41 PM

## 2024-09-10 NOTE — Progress Notes (Signed)
 Discharge meds in a secure bag delivered to patient by this RN

## 2024-09-10 NOTE — Plan of Care (Signed)
" °  Problem: Health Behavior/Discharge Planning: Goal: Ability to manage health-related needs will improve Outcome: Progressing   Problem: Clinical Measurements: Goal: Respiratory complications will improve Outcome: Progressing   Problem: Education: Goal: Ability to demonstrate management of disease process will improve Outcome: Progressing Goal: Ability to verbalize understanding of medication therapies will improve Outcome: Progressing   Problem: Health Behavior/Discharge Planning: Goal: Ability to manage health-related needs will improve Outcome: Progressing   "

## 2024-09-10 NOTE — Progress Notes (Signed)
 IV removed, belongings packed, instructions given with understanding verbalized, pending PTAR pickup.

## 2024-09-22 ENCOUNTER — Ambulatory Visit: Admitting: Podiatry

## 2024-09-23 ENCOUNTER — Ambulatory Visit: Admitting: Emergency Medicine

## 2024-11-04 ENCOUNTER — Ambulatory Visit: Admitting: Podiatry

## 2024-12-15 ENCOUNTER — Ambulatory Visit: Admitting: Podiatry
